# Patient Record
Sex: Female | Born: 1940 | Race: Black or African American | Hispanic: No | Marital: Married | Smoking: Former smoker
Health system: Southern US, Community
[De-identification: ages and names within clinical notes are randomized; demographics above are authoritative.]

## PROBLEM LIST (undated history)

## (undated) DIAGNOSIS — Z8701 Personal history of pneumonia (recurrent): Secondary | ICD-10-CM

## (undated) DIAGNOSIS — Z862 Personal history of diseases of the blood and blood-forming organs and certain disorders involving the immune mechanism: Secondary | ICD-10-CM

## (undated) DIAGNOSIS — E785 Hyperlipidemia, unspecified: Secondary | ICD-10-CM

## (undated) DIAGNOSIS — I10 Essential (primary) hypertension: Secondary | ICD-10-CM

## (undated) DIAGNOSIS — R350 Frequency of micturition: Secondary | ICD-10-CM

## (undated) DIAGNOSIS — I213 ST elevation (STEMI) myocardial infarction of unspecified site: Principal | ICD-10-CM

## (undated) DIAGNOSIS — I739 Peripheral vascular disease, unspecified: Secondary | ICD-10-CM

## (undated) DIAGNOSIS — IMO0001 Reserved for inherently not codable concepts without codable children: Secondary | ICD-10-CM

## (undated) DIAGNOSIS — Z9289 Personal history of other medical treatment: Secondary | ICD-10-CM

## (undated) DIAGNOSIS — I639 Cerebral infarction, unspecified: Secondary | ICD-10-CM

## (undated) DIAGNOSIS — H9190 Unspecified hearing loss, unspecified ear: Secondary | ICD-10-CM

## (undated) DIAGNOSIS — R3915 Urgency of urination: Secondary | ICD-10-CM

## (undated) DIAGNOSIS — E0590 Thyrotoxicosis, unspecified without thyrotoxic crisis or storm: Secondary | ICD-10-CM

## (undated) DIAGNOSIS — M479 Spondylosis, unspecified: Secondary | ICD-10-CM

## (undated) DIAGNOSIS — J449 Chronic obstructive pulmonary disease, unspecified: Secondary | ICD-10-CM

## (undated) HISTORY — DX: Peripheral vascular disease, unspecified: I73.9

## (undated) HISTORY — DX: Hyperlipidemia, unspecified: E78.5

## (undated) HISTORY — PX: OVARY SURGERY: SHX727

## (undated) HISTORY — PX: OTHER SURGICAL HISTORY: SHX169

## (undated) HISTORY — PX: ABDOMINAL HYSTERECTOMY: SHX81

## (undated) HISTORY — DX: Essential (primary) hypertension: I10

## (undated) HISTORY — DX: ST elevation (STEMI) myocardial infarction of unspecified site: I21.3

## (undated) HISTORY — DX: Chronic obstructive pulmonary disease, unspecified: J44.9

## (undated) HISTORY — DX: Spondylosis, unspecified: M47.9

## (undated) HISTORY — DX: Thyrotoxicosis, unspecified without thyrotoxic crisis or storm: E05.90

---

## 1998-12-21 ENCOUNTER — Emergency Department (HOSPITAL_COMMUNITY): Admission: EM | Admit: 1998-12-21 | Discharge: 1998-12-21 | Payer: Self-pay | Admitting: Emergency Medicine

## 1999-11-01 ENCOUNTER — Emergency Department (HOSPITAL_COMMUNITY): Admission: EM | Admit: 1999-11-01 | Discharge: 1999-11-01 | Payer: Self-pay | Admitting: Emergency Medicine

## 2001-03-23 ENCOUNTER — Ambulatory Visit (HOSPITAL_COMMUNITY): Admission: RE | Admit: 2001-03-23 | Discharge: 2001-03-23 | Payer: Self-pay | Admitting: Family Medicine

## 2001-03-23 ENCOUNTER — Encounter: Payer: Self-pay | Admitting: Family Medicine

## 2002-01-28 ENCOUNTER — Encounter: Admission: RE | Admit: 2002-01-28 | Discharge: 2002-01-28 | Payer: Self-pay | Admitting: Family Medicine

## 2002-01-28 ENCOUNTER — Encounter: Payer: Self-pay | Admitting: Family Medicine

## 2002-02-02 ENCOUNTER — Encounter: Admission: RE | Admit: 2002-02-02 | Discharge: 2002-02-02 | Payer: Self-pay | Admitting: Family Medicine

## 2002-02-02 ENCOUNTER — Encounter: Payer: Self-pay | Admitting: Family Medicine

## 2002-03-02 ENCOUNTER — Encounter: Admission: RE | Admit: 2002-03-02 | Discharge: 2002-03-02 | Payer: Self-pay | Admitting: Family Medicine

## 2002-03-02 ENCOUNTER — Encounter: Payer: Self-pay | Admitting: Family Medicine

## 2002-12-08 HISTORY — PX: TOTAL HIP ARTHROPLASTY: SHX124

## 2003-02-08 ENCOUNTER — Encounter: Admission: RE | Admit: 2003-02-08 | Discharge: 2003-02-08 | Payer: Self-pay | Admitting: Orthopedic Surgery

## 2003-02-08 ENCOUNTER — Encounter: Payer: Self-pay | Admitting: Orthopedic Surgery

## 2003-06-16 ENCOUNTER — Encounter: Payer: Self-pay | Admitting: Orthopedic Surgery

## 2003-06-19 ENCOUNTER — Encounter: Payer: Self-pay | Admitting: Orthopedic Surgery

## 2003-06-19 ENCOUNTER — Inpatient Hospital Stay (HOSPITAL_COMMUNITY): Admission: RE | Admit: 2003-06-19 | Discharge: 2003-06-22 | Payer: Self-pay | Admitting: Orthopedic Surgery

## 2004-02-14 ENCOUNTER — Encounter: Admission: RE | Admit: 2004-02-14 | Discharge: 2004-02-14 | Payer: Self-pay | Admitting: Family Medicine

## 2006-01-22 ENCOUNTER — Encounter: Admission: RE | Admit: 2006-01-22 | Discharge: 2006-01-22 | Payer: Self-pay | Admitting: Family Medicine

## 2006-02-05 ENCOUNTER — Encounter: Admission: RE | Admit: 2006-02-05 | Discharge: 2006-02-05 | Payer: Self-pay | Admitting: Family Medicine

## 2006-02-24 ENCOUNTER — Encounter: Admission: RE | Admit: 2006-02-24 | Discharge: 2006-02-24 | Payer: Self-pay | Admitting: Family Medicine

## 2007-09-24 ENCOUNTER — Encounter: Admission: RE | Admit: 2007-09-24 | Discharge: 2007-09-24 | Payer: Self-pay | Admitting: Family Medicine

## 2008-09-10 ENCOUNTER — Emergency Department (HOSPITAL_COMMUNITY): Admission: EM | Admit: 2008-09-10 | Discharge: 2008-09-10 | Payer: Self-pay | Admitting: Family Medicine

## 2009-01-21 ENCOUNTER — Emergency Department (HOSPITAL_COMMUNITY): Admission: EM | Admit: 2009-01-21 | Discharge: 2009-01-22 | Payer: Self-pay | Admitting: Emergency Medicine

## 2009-03-05 ENCOUNTER — Encounter (HOSPITAL_COMMUNITY): Admission: RE | Admit: 2009-03-05 | Discharge: 2009-06-03 | Payer: Self-pay | Admitting: Internal Medicine

## 2010-04-07 ENCOUNTER — Encounter: Payer: Self-pay | Admitting: Emergency Medicine

## 2010-04-07 ENCOUNTER — Inpatient Hospital Stay (HOSPITAL_COMMUNITY): Admission: EM | Admit: 2010-04-07 | Discharge: 2010-04-10 | Payer: Self-pay | Admitting: Cardiology

## 2010-04-07 ENCOUNTER — Ambulatory Visit: Payer: Self-pay | Admitting: Internal Medicine

## 2010-04-07 DIAGNOSIS — I213 ST elevation (STEMI) myocardial infarction of unspecified site: Secondary | ICD-10-CM

## 2010-04-07 HISTORY — PX: CARDIAC CATHETERIZATION: SHX172

## 2010-04-07 HISTORY — DX: ST elevation (STEMI) myocardial infarction of unspecified site: I21.3

## 2010-05-02 ENCOUNTER — Encounter (HOSPITAL_COMMUNITY): Admission: RE | Admit: 2010-05-02 | Discharge: 2010-06-03 | Payer: Self-pay | Admitting: Cardiology

## 2010-05-30 ENCOUNTER — Ambulatory Visit: Payer: Self-pay | Admitting: Vascular Surgery

## 2010-05-30 ENCOUNTER — Inpatient Hospital Stay (HOSPITAL_COMMUNITY): Admission: EM | Admit: 2010-05-30 | Discharge: 2010-05-31 | Payer: Self-pay | Admitting: Emergency Medicine

## 2010-06-04 ENCOUNTER — Encounter (HOSPITAL_COMMUNITY): Admission: RE | Admit: 2010-06-04 | Discharge: 2010-09-02 | Payer: Self-pay | Admitting: Cardiology

## 2010-07-23 ENCOUNTER — Ambulatory Visit: Payer: Self-pay | Admitting: Vascular Surgery

## 2010-08-01 ENCOUNTER — Ambulatory Visit: Payer: Self-pay | Admitting: Cardiology

## 2010-08-08 ENCOUNTER — Ambulatory Visit: Payer: Self-pay | Admitting: Cardiology

## 2010-08-13 ENCOUNTER — Emergency Department (HOSPITAL_COMMUNITY): Admission: EM | Admit: 2010-08-13 | Discharge: 2010-08-14 | Payer: Self-pay | Source: Home / Self Care

## 2010-08-14 ENCOUNTER — Ambulatory Visit: Payer: Self-pay | Admitting: Vascular Surgery

## 2010-09-09 ENCOUNTER — Ambulatory Visit: Payer: Self-pay | Admitting: Cardiology

## 2010-12-29 ENCOUNTER — Encounter: Payer: Self-pay | Admitting: Family Medicine

## 2011-01-13 ENCOUNTER — Ambulatory Visit (INDEPENDENT_AMBULATORY_CARE_PROVIDER_SITE_OTHER): Payer: Medicare Other | Admitting: *Deleted

## 2011-01-13 ENCOUNTER — Encounter: Payer: Self-pay | Admitting: *Deleted

## 2011-01-13 VITALS — BP 138/90 | HR 64 | Resp 18 | Wt 106.4 lb

## 2011-01-13 DIAGNOSIS — J449 Chronic obstructive pulmonary disease, unspecified: Secondary | ICD-10-CM

## 2011-01-13 DIAGNOSIS — I219 Acute myocardial infarction, unspecified: Secondary | ICD-10-CM

## 2011-01-13 DIAGNOSIS — I739 Peripheral vascular disease, unspecified: Secondary | ICD-10-CM | POA: Insufficient documentation

## 2011-01-13 DIAGNOSIS — E785 Hyperlipidemia, unspecified: Secondary | ICD-10-CM | POA: Insufficient documentation

## 2011-01-13 DIAGNOSIS — I10 Essential (primary) hypertension: Secondary | ICD-10-CM

## 2011-01-13 DIAGNOSIS — I213 ST elevation (STEMI) myocardial infarction of unspecified site: Secondary | ICD-10-CM

## 2011-01-13 DIAGNOSIS — M479 Spondylosis, unspecified: Secondary | ICD-10-CM

## 2011-01-13 DIAGNOSIS — E0590 Thyrotoxicosis, unspecified without thyrotoxic crisis or storm: Secondary | ICD-10-CM

## 2011-01-13 NOTE — Assessment & Plan Note (Signed)
Currently doing well without return of chest pain. She is committed to her Plavix until at least May of 2012.

## 2011-01-13 NOTE — Assessment & Plan Note (Signed)
Blood pressures are satisfactory at home. We will keep her on her current regimen.

## 2011-01-13 NOTE — Assessment & Plan Note (Addendum)
CMET and lipids will be checked today. For now we will keep her on her Pravachol.

## 2011-01-13 NOTE — Progress Notes (Signed)
Kristine [name] is seen today for a 4 month visit. She is seen for Dr. Deborah Chalk. She is doing well from our standpoint. She is not having chest pain. She occasionally will have dyspnea. She has had some cold symptoms over the past 2 weeks. She is not able to exercise because of her prior left ankle fracture. She is tolerating her medicines. She is keeping check of her blood pressure at home. Her readings have been good. She has cut her hydralazine back because of low readings. She has been a little more anxious because of some family issues. She is not smoking. She denies claudication but she has also not been walking.   The 15 point review of systems is positive for sinus problems, mild dyspnea, joint pain and difficulties walking. All other review of systems are negative.   The past medical history, family history, social history, meds and allergies are reviewed.  Exam:  She is very pleasant and in no acute distress. Skin is warm and dry. Color is normal. Blood pressure was 138/90. She has not had her meds today. HEENT is unremarkable. Lungs are clear. Cardiac exam shows a regular rate and rhythm. Abdomen is soft. Extremities are without edema. Gait and ROM are intact. She has a brace on the left ankle. She has no gross neurologic deficits.

## 2011-01-13 NOTE — Assessment & Plan Note (Signed)
No current complaints of claudication, but also not exercising. I have encouraged her to get back into her walking routine or consider water exercises.

## 2011-02-23 LAB — URINALYSIS, ROUTINE W REFLEX MICROSCOPIC
Hgb urine dipstick: NEGATIVE
Nitrite: NEGATIVE
Protein, ur: NEGATIVE mg/dL
Specific Gravity, Urine: 1.015 (ref 1.005–1.030)
Urobilinogen, UA: 0.2 mg/dL (ref 0.0–1.0)

## 2011-02-23 LAB — CBC
Hemoglobin: 12.1 g/dL (ref 12.0–15.0)
Hemoglobin: 12.3 g/dL (ref 12.0–15.0)
MCH: 30.3 pg (ref 26.0–34.0)
MCH: 31.2 pg (ref 26.0–34.0)
MCHC: 32.8 g/dL (ref 30.0–36.0)
MCHC: 33.6 g/dL (ref 30.0–36.0)
Platelets: 286 10*3/uL (ref 150–400)
RDW: 14.9 % (ref 11.5–15.5)
RDW: 14.9 % (ref 11.5–15.5)

## 2011-02-23 LAB — CARDIAC PANEL(CRET KIN+CKTOT+MB+TROPI)
CK, MB: 1.6 ng/mL (ref 0.3–4.0)
CK, MB: 2.1 ng/mL (ref 0.3–4.0)
Relative Index: 1.1 (ref 0.0–2.5)
Total CK: 150 U/L (ref 7–177)
Total CK: 230 U/L — ABNORMAL HIGH (ref 7–177)
Troponin I: 0.01 ng/mL (ref 0.00–0.06)
Troponin I: <0.01 ng/mL (ref 0.00–0.06)

## 2011-02-23 LAB — COMPREHENSIVE METABOLIC PANEL
Alkaline Phosphatase: 101 U/L (ref 39–117)
BUN: 11 mg/dL (ref 6–23)
Chloride: 107 mEq/L (ref 96–112)
GFR calc non Af Amer: >60 mL/min (ref >60)
Glucose, Bld: 161 mg/dL — ABNORMAL HIGH (ref 70–99)
Potassium: 3.9 mEq/L (ref 3.5–5.1)
Total Bilirubin: 0.4 mg/dL (ref 0.3–1.2)

## 2011-02-23 LAB — BASIC METABOLIC PANEL
BUN: 8 mg/dL (ref 6–23)
CO2: 28 mEq/L (ref 19–32)
Calcium: 9.4 mg/dL (ref 8.4–10.5)
Calcium: 9.4 mg/dL (ref 8.4–10.5)
Creatinine, Ser: 0.7 mg/dL (ref 0.4–1.2)
Creatinine, Ser: 0.9 mg/dL (ref 0.4–1.2)
GFR calc Af Amer: >60 mL/min (ref >60)
GFR calc non Af Amer: >60 mL/min (ref >60)
GFR calc non Af Amer: >60 mL/min (ref >60)
Glucose, Bld: 87 mg/dL (ref 70–99)

## 2011-02-23 LAB — DIFFERENTIAL
Basophils Absolute: 0.1 10*3/uL (ref 0.0–0.1)
Basophils Relative: 1 % (ref 0–1)
Eosinophils Absolute: 0.2 10*3/uL (ref 0.0–0.7)
Monocytes Relative: 8 % (ref 3–12)
Neutro Abs: 3 10*3/uL (ref 1.7–7.7)
Neutrophils Relative %: 63 % (ref 43–77)

## 2011-02-23 LAB — GLUCOSE, CAPILLARY
Glucose-Capillary: 128 mg/dL — ABNORMAL HIGH (ref 70–99)
Glucose-Capillary: 218 mg/dL — ABNORMAL HIGH (ref 70–99)

## 2011-02-23 LAB — PROTIME-INR: Prothrombin Time: 13.2 seconds (ref 11.6–15.2)

## 2011-02-23 LAB — LIPID PANEL
Cholesterol: 199 mg/dL (ref 0–200)
Total CHOL/HDL Ratio: 3 RATIO
VLDL: 16 mg/dL (ref 0–40)

## 2011-02-23 LAB — HEPATIC FUNCTION PANEL
AST: 24 U/L (ref 0–37)
Albumin: 3.4 g/dL — ABNORMAL LOW (ref 3.5–5.2)
Alkaline Phosphatase: 92 U/L (ref 39–117)
Bilirubin, Direct: 0.1 mg/dL (ref 0.0–0.3)
Total Bilirubin: 0.5 mg/dL (ref 0.3–1.2)

## 2011-02-23 LAB — POCT CARDIAC MARKERS
CKMB, poc: 1.1 ng/mL (ref 1.0–8.0)
Myoglobin, poc: 70.2 ng/mL (ref 12–200)

## 2011-02-23 LAB — HEMOGLOBIN A1C: Hgb A1c MFr Bld: 5.7 % — ABNORMAL HIGH (ref <5.7)

## 2011-02-25 LAB — POCT I-STAT, CHEM 8
HCT: 45 % (ref 36.0–46.0)
Hemoglobin: 15.3 g/dL — ABNORMAL HIGH (ref 12.0–15.0)
Potassium: 3.6 mEq/L (ref 3.5–5.1)
Sodium: 141 mEq/L (ref 135–145)

## 2011-02-25 LAB — BASIC METABOLIC PANEL
BUN: 14 mg/dL (ref 6–23)
CO2: 26 mEq/L (ref 19–32)
CO2: 30 mEq/L (ref 19–32)
Calcium: 8.2 mg/dL — ABNORMAL LOW (ref 8.4–10.5)
Calcium: 8.3 mg/dL — ABNORMAL LOW (ref 8.4–10.5)
Calcium: 8.6 mg/dL (ref 8.4–10.5)
Chloride: 108 mEq/L (ref 96–112)
GFR calc Af Amer: >60 mL/min (ref >60)
GFR calc non Af Amer: >60 mL/min (ref >60)
Glucose, Bld: 119 mg/dL — ABNORMAL HIGH (ref 70–99)
Glucose, Bld: 87 mg/dL (ref 70–99)
Potassium: 3.6 mEq/L (ref 3.5–5.1)
Potassium: 4 mEq/L (ref 3.5–5.1)
Potassium: 4.3 mEq/L (ref 3.5–5.1)
Sodium: 139 mEq/L (ref 135–145)
Sodium: 139 mEq/L (ref 135–145)
Sodium: 141 mEq/L (ref 135–145)

## 2011-02-25 LAB — DIFFERENTIAL
Basophils Absolute: 0 10*3/uL (ref 0.0–0.1)
Basophils Absolute: 0 10*3/uL (ref 0.0–0.1)
Basophils Absolute: 0.1 10*3/uL (ref 0.0–0.1)
Basophils Relative: 0 % (ref 0–1)
Basophils Relative: 0 % (ref 0–1)
Basophils Relative: 1 % (ref 0–1)
Eosinophils Absolute: 0 10*3/uL (ref 0.0–0.7)
Eosinophils Relative: 0 % (ref 0–5)
Eosinophils Relative: 3 % (ref 0–5)
Lymphocytes Relative: 23 % (ref 12–46)
Lymphocytes Relative: 33 % (ref 12–46)
Lymphocytes Relative: 29 % (ref 12–46)
Lymphs Abs: 2 10*3/uL (ref 0.7–4.0)
Lymphs Abs: 1.9 10*3/uL (ref 0.7–4.0)
Monocytes Absolute: 0.6 10*3/uL (ref 0.1–1.0)
Monocytes Absolute: 0.8 10*3/uL (ref 0.1–1.0)
Monocytes Relative: 10 % (ref 3–12)
Neutro Abs: 3.4 10*3/uL (ref 1.7–7.7)
Neutro Abs: 3.5 10*3/uL (ref 1.7–7.7)
Neutro Abs: 6.9 10*3/uL (ref 1.7–7.7)
Neutrophils Relative %: 69 % (ref 43–77)
Neutrophils Relative %: 60 % (ref 43–77)

## 2011-02-25 LAB — TSH: TSH: 0.47 u[IU]/mL (ref 0.350–4.500)

## 2011-02-25 LAB — GLUCOSE, CAPILLARY
Glucose-Capillary: 101 mg/dL — ABNORMAL HIGH (ref 70–99)
Glucose-Capillary: 102 mg/dL — ABNORMAL HIGH (ref 70–99)
Glucose-Capillary: 163 mg/dL — ABNORMAL HIGH (ref 70–99)
Glucose-Capillary: 175 mg/dL — ABNORMAL HIGH (ref 70–99)
Glucose-Capillary: 81 mg/dL (ref 70–99)
Glucose-Capillary: 93 mg/dL (ref 70–99)
Glucose-Capillary: 94 mg/dL (ref 70–99)
Glucose-Capillary: 97 mg/dL (ref 70–99)

## 2011-02-25 LAB — CBC
HCT: 33.5 % — ABNORMAL LOW (ref 36.0–46.0)
HCT: 34.5 % — ABNORMAL LOW (ref 36.0–46.0)
HCT: 34.9 % — ABNORMAL LOW (ref 36.0–46.0)
HCT: 41.8 % (ref 36.0–46.0)
Hemoglobin: 11.3 g/dL — ABNORMAL LOW (ref 12.0–15.0)
Hemoglobin: 11.9 g/dL — ABNORMAL LOW (ref 12.0–15.0)
Hemoglobin: 12.2 g/dL (ref 12.0–15.0)
MCHC: 33.8 g/dL (ref 30.0–36.0)
MCHC: 34.5 g/dL (ref 30.0–36.0)
MCV: 92.1 fL (ref 78.0–100.0)
MCV: 92.3 fL (ref 78.0–100.0)
Platelets: 216 10*3/uL (ref 150–400)
Platelets: 238 10*3/uL (ref 150–400)
RBC: 3.63 MIL/uL — ABNORMAL LOW (ref 3.87–5.11)
RBC: 3.82 MIL/uL — ABNORMAL LOW (ref 3.87–5.11)
RDW: 14.6 % (ref 11.5–15.5)
RDW: 14.9 % (ref 11.5–15.5)
WBC: 6.1 10*3/uL (ref 4.0–10.5)
WBC: 6.4 10*3/uL (ref 4.0–10.5)

## 2011-02-25 LAB — COMPREHENSIVE METABOLIC PANEL
ALT: 16 U/L (ref 0–35)
Alkaline Phosphatase: 74 U/L (ref 39–117)
CO2: 25 mEq/L (ref 19–32)
GFR calc non Af Amer: >60 mL/min (ref >60)
Glucose, Bld: 155 mg/dL — ABNORMAL HIGH (ref 70–99)
Potassium: 4 mEq/L (ref 3.5–5.1)
Sodium: 137 mEq/L (ref 135–145)
Total Bilirubin: 0.4 mg/dL (ref 0.3–1.2)

## 2011-02-25 LAB — LIPID PANEL
LDL Cholesterol: 105 mg/dL — ABNORMAL HIGH (ref 0–99)
Total CHOL/HDL Ratio: 3 RATIO
VLDL: 9 mg/dL (ref 0–40)

## 2011-02-25 LAB — CARDIAC PANEL(CRET KIN+CKTOT+MB+TROPI)
CK, MB: 4 ng/mL (ref 0.3–4.0)
Relative Index: 1.8 (ref 0.0–2.5)
Relative Index: 2.2 (ref 0.0–2.5)
Relative Index: 2.7 — ABNORMAL HIGH (ref 0.0–2.5)
Total CK: 204 U/L — ABNORMAL HIGH (ref 7–177)
Troponin I: 0.56 ng/mL (ref 0.00–0.06)
Troponin I: 0.58 ng/mL (ref 0.00–0.06)

## 2011-02-25 LAB — POCT CARDIAC MARKERS
CKMB, poc: 2.7 ng/mL (ref 1.0–8.0)
Myoglobin, poc: 98.2 ng/mL (ref 12–200)

## 2011-02-25 LAB — PROTIME-INR
INR: 0.95 (ref 0.00–1.49)
Prothrombin Time: 12.6 seconds (ref 11.6–15.2)

## 2011-02-25 LAB — MRSA PCR SCREENING: MRSA by PCR: NEGATIVE

## 2011-02-25 LAB — C-REACTIVE PROTEIN: CRP: 0.1 mg/dL — ABNORMAL LOW (ref <0.6)

## 2011-02-25 LAB — SEDIMENTATION RATE: Sed Rate: 19 mm/hr (ref 0–22)

## 2011-04-14 ENCOUNTER — Other Ambulatory Visit: Payer: Self-pay | Admitting: *Deleted

## 2011-04-14 DIAGNOSIS — E7800 Pure hypercholesterolemia, unspecified: Secondary | ICD-10-CM

## 2011-04-15 ENCOUNTER — Other Ambulatory Visit: Payer: Medicare Other | Admitting: *Deleted

## 2011-04-22 NOTE — Consult Note (Signed)
NEW PATIENT CONSULTATION   IRVING, LUBBERS  DOB:  [birth year]                                       07/23/2010  CHART#:[id number]   The patient is a 70 year old female referred by Dr. Deborah Chalk for lower  extremity occlusive disease.  This patient suffered a myocardial  infarction in May of this year and had PTCA and stenting of her coronary  arteries.  She also suffered a small stroke or TIA in June of this year  consisting of some slurred speech which was transient as well as a  headache over her right ear.  She has been noting heavy discomfort in  her legs while walking on the treadmill with rehab.  She describes this  as a burning discomfort.  She is able to walk at least one block without  stopping before her symptoms begin but cannot walk 1 mile.  She has no  history of infection, gangrene, rest pain or cellulitis.  She does  occasionally have burning on the soles of both feet as well as a reddish  discoloration in her feet.  She states she is not being terribly limited  by this at the present time.   CHRONIC MEDICAL PROBLEMS:  1. Hypertension.  2. Hyperlipidemia.  3. Coronary artery disease previous MI in may of 2011.  4. COPD.  5. History of stroke June of 2011.  6. Hyperthyroidism.   PAST SURGICAL HISTORY:  Left hip replacement, tubal ligation,  hysterectomy and cyst removal.   SOCIAL HISTORY:  The patient is married, has three children, is retired,  smoked a pack of cigarettes per day for 51 years, stopped in May of this  year and does not use alcohol.   FAMILY HISTORY:  Positive for myocardial infarction in her father,  diabetes in an aunt and negative for stroke.   REVIEW OF SYSTEMS:  Positive for some weight loss secondary to her  hyperthyroidism, syncope, headaches, bronchitis, mini stroke as noted,  leg discomfort noted in present illness, occasional chest discomfort,  urinary frequency.  All other systems in review of systems are  negative.   PHYSICAL EXAM:  Vital signs:  Blood pressure 154/92, heart rate 63,  respirations 16.  General:  This is a well-developed, well-nourished  female who is alert and oriented times 3.  She is in no distress.  She  is thin.  HEENT:  Exam normal for age.  EOMs intact.  Lungs:  Clear to  auscultation.  No rhonchi or wheezing.  Cardiovascular:  Regular rhythm.  No murmurs.  Carotid pulses 3+.  No bruits.  Abdomen:  Soft, nontender  with no masses.  Musculoskeletal:  Exam is free of major deformities.  Neurological:  Normal.  Skin:  Free of rashes.  Lower extremity exam  reveals the right leg to have a 2+ femoral, 1-2+ popliteal and 1-2+  posterior tibial pulse palpable.  Left leg has a 2+ femoral, 2+  popliteal and 1-2+ posterior tibial pulse palpable.  There is no  evidence of any infection, ulceration or ischemia in the leg.   I reviewed the records provided by Dr. Deborah Chalk including lower extremity  arterial Dopplers and carotid duplex exam performed at [facility].  Lower extremity Dopplers revealed an ABI of 0.83 in the right posterior  tibial and 0.99 in the left posterior tibial.  This carotid disease is  very mild in the 30% range.   She does have some atherosclerosis in her lower extremities but  certainly would not need treatment at this time.  I have recommended her  increasing her activity as tolerated to help stimulate collaterals.  If  she should develop progressive claudication which is severely limiting  her or limb threatening problems we will need to evaluate her again at  that time but there is no need for further evaluation or workup at this  time.     Quita Skye Hart Rochester, M.D.  Electronically Signed   JDL/MEDQ  D:  07/23/2010  T:  07/24/2010  Job:  [id number]   cc:   Colleen Can. Deborah Chalk, M.D.  Dr Clyda Greener

## 2011-04-25 ENCOUNTER — Other Ambulatory Visit: Payer: Self-pay | Admitting: Cardiology

## 2011-04-25 NOTE — Discharge Summary (Signed)
Kristine [name], Kristine [name]                         ACCOUNT NO.:  [id number]   MEDICAL RECORD NO.:  [id number]                   PATIENT TYPE:  INP   LOCATION:  5006                                 FACILITY:  [facility]   PHYSICIAN:  Loreta Ave, M.D.              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   DISCHARGE DIAGNOSES:  1. 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  2. Chronic airway obstruction.  3. Hypertension.  4. Tobacco use.   PROCEDURE:  Left total hip replacement.   HISTORY:  70 year old black female with persistent left hip pain first  evaluated in 1995.  She has had steady deterioration of her symptoms.  She  is now having rest and night time pain.  Radiographic evidence of  degenerative joint disease of the left hip which is end-stage.  She is now  indicated for left total hip replacement.   HOSPITAL COURSE:  70 year old female admitted 06/19/2003 after appropriate  laboratory studies were obtained as well as one gram Ancef IV on call to the  operating room.  She was taken to the operating room where she had a left  total hip replacement.  She tolerated the procedure well.  She was continued  postoperatively on Ancef one gram IV q.8h x3 doses.  A Foley was placed  intraoperatively.  She was placed on heparin 5000 units SQ q.12h until her  Coumadin became therapeutic.  Consultation with physical therapy and  occupational therapy were obtained.  Ambulation touch down weight bearing on  the left.  A nicotine patch was placed by pharmacy.  She was allowed out of  bed to a chair the following day.  She was weaned to oral pain medications  on June 21, 2003.  The remainder of her hospital course was uneventful.  She  was discharged on June 22, 2003 to return to our office in follow up in 10  to 14 days.  She was discharged in improved condition.   EKG revealed normal sinus rhythm with biatrial enlargement.  Pulmonary  disease pattern.  Portable chest x-ray on March 03, 2003 without acute  cardiopulmonary findings.  Bi-apical pleural and parenchymal scarring  changes probably related to remote granulomatous disease.  Portable left hip  reveals a non-cemented femoral and acetabular components of the left hip  arthroplasty projected in expected location.  Negative for fracture or other  apparent complications.   LABORATORY STUDIES:  Hemoglobin 14.2, hematocrit 42.0%, white count 6100,  platelets 285,000.  Discharge hemoglobin 8.9, hematocrit 26.0%, white count  9600, platelets 222,000.  Discharge protime was 16.5 with an INR of 1.5.  Postoperative chemistry - sodium 138, potassium 5.0, chloride 106, CO2 26,  BUN 15, glucose 90, creatinine 0.8, calcium 9.8,  total protein 6.9, albumin  4.0.  AST 28, ALT 16, AFP 74, total bilirubin 1.4.  Discharge sodium 139,  potassium 3.7, chloride 101, CO2 32, glucose 133, BUN 7, creatinine 0.6 and  calcium 8.9.  Urinalysis was benign for a voided urine.  Blood type was AB  positive, antibody screen negative.   DISCHARGE MEDICATIONS:  1. Prescription for Percocet 5/325 one to two tablets q.4h p.r.n. pain.  2. Coumadin 5 mg as per [facility].  She will be given 5 mg     alternating with 2.5.  3. Colace 100 mg b.i.d.  4. Iron sulfate 325 mg b.i.d.  5. Nicotine patch over-the-counter from the drug store.  6. Lovenox 30 mg inject b.i.d. as instructed.   DISCHARGE INSTRUCTIONS:  Office taught physical therapy.  She is to remain  touch down weight bearing on the left.  No restrictions in her diet.  Keep  her wound clean and dry.  She may shower.  She will cover it with a Sterile  dressing daily.  Follow the blue instruction sheet.  Follow back with [place] in  10 to 14 days for recheck evaluation.   DISCHARGE CONDITION:  Discharged in improved  condition.      Oris Drone Petrarca, P.A.-C.                Loreta Ave, M.D.    BDP/MEDQ  D:  07/06/2003  T:  07/07/2003  Job:  [id number]

## 2011-04-25 NOTE — Op Note (Signed)
[name], Kristine [name]                         ACCOUNT NO.:  [id number]   MEDICAL RECORD NO.:  [id number]                   PATIENT TYPE:  INP   LOCATION:  5006                                 FACILITY:  [facility]   PHYSICIAN:  Loreta Ave, M.D.              DATE OF BIRTH:  [birth year]   DATE OF PROCEDURE:  06/19/2003  DATE OF DISCHARGE:                                 OPERATIVE REPORT   PREOPERATIVE DIAGNOSES:  End stage degenerative arthritis, left hip.   POSTOPERATIVE DIAGNOSES:  End stage degenerative arthritis, left hip.   OPERATION PERFORMED:  Left total hip replacement, Osteonics prosthesis.  A  50 mm Trident PSL metallic cup with a 32 mm size E ceramic insert.  A Press-  fit femoral component size #8 with a distal 12 mm stem.  Secure-Fit plus  stem.  32 mm ceramic head plus 5.   SURGEON:  Loreta Ave, M.D.   ASSISTANT:  Arlys John D. Petrarca, P.A.-C.   ANESTHESIA:  General.   ESTIMATED BLOOD LOSS:  .   BLOOD REPLACED:  None.   SPECIMENS:  Bone and soft tissue.   CULTURES:  None.   COMPLICATIONS:  None.   DRESSING:  Soft compressive with abduction pillow.   DESCRIPTION OF PROCEDURE:  The patient was brought to the operating room and  placed on the operating table in supine position.  After adequate anesthesia  had been obtained, turned to lateral position left side up.  Appropriate  padding and support.  Prepped and draped in the usual sterile fashion avoid  Betadine because of an iodine allergy.  An incision along the shaft of the  femur extending posterior superior.  Skin and subcutaneous tissue divided.  Hemostasis with cautery.  The iliotibial band incised.  Charnley retractor  put in place.  Neurovascular structures identified and protected.  External  rotator and capsule taken down off the back of  the intertrochanteric groove  on the femur and tagged with FiberWire suture.  Hip exposed.  Dislocated  posteriorly.  Grade 4 changes throughout.   Femoral head removed in line with  the prosthesis one fingerbreadth above the lesser trochanter. The acetabulum  exposed.  Marked degenerative changes.  Sequential reaming up to good  bleeding bone 50 mm.  After being appropriately sized, the 50 mm insert was  then hammered in place at 45 degrees of abduction, 20 degrees of anteversion  and further fixed with two screws through the cup which were predrilled and  tapped.  32 mm size E insert inserted.  Attention turned to the femur.  Sequential reaming with power and hand held reamers proximally and distally  until I got good fitting both proximally and distally for a #8 Secure-Fit  plus component with a 12 mm stem.  After approximately trialing, the  definitive stem was hammered in place restoring normal femoral anteversion.  With the plus 5 ceramic head, 32 mm,  the hip was reduced.  Excellent  stability, flexion and extension with good restoration of leg lengths.  Wound irrigated.  External rotator and capsule were repaired to the back of  the intertrochanteric groove through drill holes and the FiberWire tied over  a bony bridge.  Charnley retractor removed.  The iliotibial band closed with  #1 Vicryl.  Skin and subcutaneous tissue with Vicryl and staples.  Margins  of the wound injected with Marcaine.  Sterile compressive dressing applied.  Returned to supine position.  Abduction pillow placed.  Anesthesia reversed.  Brought to recovery room.  Tolerated surgery well without complication.                                                Loreta Ave, M.D.    DFM/MEDQ  D:  06/19/2003  T:  06/19/2003  Job:  [id number]

## 2011-07-15 ENCOUNTER — Encounter: Payer: Self-pay | Admitting: Nurse Practitioner

## 2011-07-23 ENCOUNTER — Other Ambulatory Visit: Payer: Self-pay | Admitting: Cardiology

## 2011-07-23 ENCOUNTER — Other Ambulatory Visit (INDEPENDENT_AMBULATORY_CARE_PROVIDER_SITE_OTHER): Payer: Medicare Other | Admitting: *Deleted

## 2011-07-23 ENCOUNTER — Ambulatory Visit (INDEPENDENT_AMBULATORY_CARE_PROVIDER_SITE_OTHER): Payer: Medicare Other | Admitting: Nurse Practitioner

## 2011-07-23 ENCOUNTER — Encounter: Payer: Self-pay | Admitting: Nurse Practitioner

## 2011-07-23 VITALS — BP 110/72 | HR 64 | Ht 62.0 in | Wt 115.2 lb

## 2011-07-23 DIAGNOSIS — I213 ST elevation (STEMI) myocardial infarction of unspecified site: Secondary | ICD-10-CM

## 2011-07-23 DIAGNOSIS — E785 Hyperlipidemia, unspecified: Secondary | ICD-10-CM

## 2011-07-23 DIAGNOSIS — I10 Essential (primary) hypertension: Secondary | ICD-10-CM

## 2011-07-23 DIAGNOSIS — I219 Acute myocardial infarction, unspecified: Secondary | ICD-10-CM

## 2011-07-23 DIAGNOSIS — E7800 Pure hypercholesterolemia, unspecified: Secondary | ICD-10-CM

## 2011-07-23 DIAGNOSIS — I739 Peripheral vascular disease, unspecified: Secondary | ICD-10-CM

## 2011-07-23 DIAGNOSIS — E0590 Thyrotoxicosis, unspecified without thyrotoxic crisis or storm: Secondary | ICD-10-CM

## 2011-07-23 LAB — HEPATIC FUNCTION PANEL
Alkaline Phosphatase: 78 U/L (ref 39–117)
Bilirubin, Direct: 0.1 mg/dL (ref 0.0–0.3)
Total Bilirubin: 0.5 mg/dL (ref 0.3–1.2)
Total Protein: 7.6 g/dL (ref 6.0–8.3)

## 2011-07-23 LAB — BASIC METABOLIC PANEL
CO2: 30 mEq/L (ref 19–32)
Calcium: 9.4 mg/dL (ref 8.4–10.5)
Creatinine, Ser: 0.9 mg/dL (ref 0.4–1.2)
GFR: 85.52 mL/min (ref >60.00)
Sodium: 142 mEq/L (ref 135–145)

## 2011-07-23 NOTE — Assessment & Plan Note (Signed)
Blood pressure looks good. No change in her medicines.  

## 2011-07-23 NOTE — Patient Instructions (Signed)
Regular exercise is encouraged Stay on your current medicines I will have you see Dr. Doylene Bode in 6 months. Call for any problems.

## 2011-07-23 NOTE — Assessment & Plan Note (Signed)
Labs are checked today.  

## 2011-07-23 NOTE — Assessment & Plan Note (Signed)
She remains on Tapazole. She is followed by Dr. Margaretmary Bayley.

## 2011-07-23 NOTE — Assessment & Plan Note (Signed)
She is doing well. She is not smoking. I have elected to leave her on her Plavix chronically with her history of PVD and prior TIA. She is agreeable. She is tolerating her medicines. I will have her see Dr. Sanjuana Kava in 6 months. Patient is agreeable to this plan and will call if any problems develop in the interim.

## 2011-07-23 NOTE — Progress Notes (Signed)
    Kristine [name] Date of Birth: 1941-06-18   History of Present Illness: Kristine [name] is seen today for her 6 month visit. She is seen for Dr. Sanjuana Kava. She is a former patient of Dr. Ronnald Nian. She continues to do well. She is over one year out from her MI. She has drug coated stents in place. She is not smoking. She is not exercising but remains busy caring for her grandchildren. She has gained some weight. She is tolerating her medicines. Blood pressure has been great at home.  Current Outpatient Prescriptions on File Prior to Visit  Medication Sig Dispense Refill  . calcium citrate-vitamin D 200-200 MG-UNIT TABS Take 1 tablet by mouth daily. OCCASIONALLY       . hydrALAZINE (APRESOLINE) 25 MG tablet Take 25 mg by mouth daily. States she is only taking once a day      . methimazole (TAPAZOLE) 5 MG tablet Take 5 mg by mouth daily.        . metoprolol (TOPROL-XL) 50 MG 24 hr tablet Take 50 mg by mouth daily.        . nitroglycerin (NITROSTAT) 0.6 MG SL tablet Place 0.6 mg under the tongue every 5 (five) minutes as needed.        Marland Kitchen PLAVIX 75 MG tablet TAKE ONE (1) TABLET(S) DAILY  30 tablet  6  . pravastatin (PRAVACHOL) 40 MG tablet Take 40 mg by mouth daily.        Marland Kitchen VITAMIN D, CHOLECALCIFEROL, PO Take 2,000 Int'l Units by mouth daily. OCCASSIONALLY        Allergies  Allergen Reactions  . Hydrocodone   . Iodides   . Shellfish-Derived Products     Past Medical History  Diagnosis Date  . STEMI (ST elevation myocardial infarction) May 2011    Treated with drug coated stents to the RCA  . HTN (hypertension)   . Hyperlipidemia   . PVD (peripheral vascular disease)   . COPD (chronic obstructive pulmonary disease)   . Hyperthyroidism     treated wtih radioactive iodine  . OA (osteoarthritis of spine)     Past Surgical History  Procedure Date  . Total hip arthroplasty 2004  . Abdominal hysterectomy     Had ovarian resection and required lysis of adhesions  . Cardiac  catheterization 04/07/2010    EF 60-70%    History  Smoking status  . Former Smoker  . Types: Cigarettes  . Quit date: 04/07/2010  Smokeless tobacco  . Not on file    History  Alcohol Use No    No family history on file.  Review of Systems: The review of systems is positive for intermittent headaches. No other neuro symptoms. She has had prior TIA from HTN in June of 2011. No claudication.  All other systems were reviewed and are negative.  Physical Exam: Ht 5\' 2"  (1.575 m)  Wt 115 lb 3.2 oz (52.254 kg)  BMI 21.07 kg/m2 Patient is very pleasant and in no acute distress. Skin is warm and dry. Color is normal.  HEENT is unremarkable. Normocephalic/atraumatic. PERRL. Sclera are nonicteric. Neck is supple. No masses. No JVD. Lungs are clear. Cardiac exam shows a regular rate and rhythm. Abdomen is soft. Extremities are without edema. Gait and ROM are intact. No gross neurologic deficits noted.  LABORATORY DATA: PENDING.   Assessment / Plan:

## 2011-07-23 NOTE — Assessment & Plan Note (Signed)
She has known PVD and has been evaluated by Dr. Hart Rochester. No complaints of claudication reported.

## 2011-07-28 ENCOUNTER — Telehealth: Payer: Self-pay | Admitting: *Deleted

## 2011-07-28 DIAGNOSIS — E7800 Pure hypercholesterolemia, unspecified: Secondary | ICD-10-CM

## 2011-07-28 MED ORDER — ATORVASTATIN CALCIUM 20 MG PO TABS: 20.0000 mg | ORAL_TABLET | Freq: Every day | ORAL | Status: DC

## 2011-07-28 NOTE — Telephone Encounter (Signed)
Notified of lab results. She will be willing to try Lipitor 20 mg. Will send to [facility] on [address]. Gave her an app for 6 wks.for fasting lab.

## 2011-07-28 NOTE — Telephone Encounter (Signed)
Message copied by Lorayne Bender on Mon Jul 28, 2011  2:47 PM ------      Message from: Rosalio Macadamia      Created: Fri Jul 25, 2011 10:32 AM       Ok to report. LDL not at goal. Would she consider switching to Lipitor? If so, start 20 mg and recheck labs in 6 weeks.

## 2011-08-08 ENCOUNTER — Other Ambulatory Visit: Payer: Self-pay | Admitting: Cardiology

## 2011-09-01 ENCOUNTER — Other Ambulatory Visit (INDEPENDENT_AMBULATORY_CARE_PROVIDER_SITE_OTHER): Payer: Medicare Other | Admitting: *Deleted

## 2011-09-01 DIAGNOSIS — E7800 Pure hypercholesterolemia, unspecified: Secondary | ICD-10-CM

## 2011-09-01 LAB — LIPID PANEL
Cholesterol: 148 mg/dL (ref 0–200)
HDL: 61.9 mg/dL (ref >39.00)
LDL Cholesterol: 69 mg/dL (ref 0–99)
Total CHOL/HDL Ratio: 2
Triglycerides: 87 mg/dL (ref 0.0–149.0)
VLDL: 17.4 mg/dL (ref 0.0–40.0)

## 2011-09-01 LAB — HEPATIC FUNCTION PANEL
ALT: 13 U/L (ref 0–35)
AST: 18 U/L (ref 0–37)
Albumin: 3.8 g/dL (ref 3.5–5.2)
Alkaline Phosphatase: 63 U/L (ref 39–117)
Bilirubin, Direct: 0 mg/dL (ref 0.0–0.3)
Total Bilirubin: 0.4 mg/dL (ref 0.3–1.2)
Total Protein: 7 g/dL (ref 6.0–8.3)

## 2011-09-04 ENCOUNTER — Telehealth: Payer: Self-pay | Admitting: *Deleted

## 2011-09-04 DIAGNOSIS — I10 Essential (primary) hypertension: Secondary | ICD-10-CM

## 2011-09-04 DIAGNOSIS — E785 Hyperlipidemia, unspecified: Secondary | ICD-10-CM

## 2011-09-04 NOTE — Telephone Encounter (Signed)
Lm w/lab results. Will repeat labs;lp,hfp,bmet in 6 mo

## 2011-09-04 NOTE — Telephone Encounter (Signed)
Message copied by Lorayne Bender on Thu Sep 04, 2011  4:13 PM ------      Message from: Rosalio Macadamia      Created: Mon Sep 01, 2011  3:50 PM       Ok to report. Labs are satisfactory.  Stay on same medicines. Recheck BMET/HPF/LIPIDS  in 6 months.

## 2011-10-31 ENCOUNTER — Other Ambulatory Visit: Payer: Self-pay | Admitting: Cardiology

## 2011-12-14 ENCOUNTER — Other Ambulatory Visit: Payer: Self-pay | Admitting: Cardiology

## 2011-12-16 ENCOUNTER — Other Ambulatory Visit: Payer: Self-pay | Admitting: Cardiovascular Disease

## 2011-12-16 DIAGNOSIS — I10 Essential (primary) hypertension: Secondary | ICD-10-CM

## 2011-12-16 MED ORDER — HYDRALAZINE HCL 25 MG PO TABS: 25.0000 mg | ORAL_TABLET | Freq: Every day | ORAL | Status: DC

## 2011-12-16 NOTE — Telephone Encounter (Signed)
New Msg: Pt calling wanting to know if pt needs to continue taking Hydralazine 25 mg. If so please call this RX into [facility]. Please return pt call to discuss further.

## 2011-12-16 NOTE — Telephone Encounter (Signed)
Reviewed note from last office visit with Norma Fredrickson, NP and pt is to continue hydralazine.  I spoke with pt and she has had no changes since this visit.  I told her she should continue.  Will sent prescription to [facility] on [address]. Clarified with pt and she takes hydralazine 25 mg once daily

## 2012-01-20 ENCOUNTER — Other Ambulatory Visit (INDEPENDENT_AMBULATORY_CARE_PROVIDER_SITE_OTHER): Payer: Medicare Other | Admitting: *Deleted

## 2012-01-20 ENCOUNTER — Encounter: Payer: Self-pay | Admitting: Cardiovascular Disease

## 2012-01-20 ENCOUNTER — Ambulatory Visit (INDEPENDENT_AMBULATORY_CARE_PROVIDER_SITE_OTHER): Payer: Medicare Other | Admitting: Cardiovascular Disease

## 2012-01-20 VITALS — BP 122/72 | HR 72 | Ht 61.0 in | Wt 120.0 lb

## 2012-01-20 DIAGNOSIS — E785 Hyperlipidemia, unspecified: Secondary | ICD-10-CM

## 2012-01-20 DIAGNOSIS — I10 Essential (primary) hypertension: Secondary | ICD-10-CM

## 2012-01-20 DIAGNOSIS — I2510 Atherosclerotic heart disease of native coronary artery without angina pectoris: Secondary | ICD-10-CM

## 2012-01-20 LAB — LIPID PANEL
Cholesterol: 164 mg/dL (ref 0–200)
HDL: 58 mg/dL (ref >39.00)
LDL Cholesterol: 91 mg/dL (ref 0–99)
Total CHOL/HDL Ratio: 3
Triglycerides: 73 mg/dL (ref 0.0–149.0)
VLDL: 14.6 mg/dL (ref 0.0–40.0)

## 2012-01-20 LAB — HEPATIC FUNCTION PANEL
ALT: 13 U/L (ref 0–35)
AST: 19 U/L (ref 0–37)
Albumin: 3.7 g/dL (ref 3.5–5.2)
Alkaline Phosphatase: 67 U/L (ref 39–117)
Bilirubin, Direct: 0 mg/dL (ref 0.0–0.3)
Total Bilirubin: 0.5 mg/dL (ref 0.3–1.2)
Total Protein: 7 g/dL (ref 6.0–8.3)

## 2012-01-20 LAB — BASIC METABOLIC PANEL
BUN: 17 mg/dL (ref 6–23)
CO2: 30 mEq/L (ref 19–32)
Calcium: 9.3 mg/dL (ref 8.4–10.5)
Chloride: 105 mEq/L (ref 96–112)
Creatinine, Ser: 0.7 mg/dL (ref 0.4–1.2)
GFR: 101.79 mL/min (ref >60.00)
Glucose, Bld: 89 mg/dL (ref 70–99)
Potassium: 4 mEq/L (ref 3.5–5.1)
Sodium: 141 mEq/L (ref 135–145)

## 2012-01-20 NOTE — Patient Instructions (Signed)
Your physician wants you to follow-up in:6 months. You will receive a reminder letter in the mail two months in advance. If you don't receive a letter, please call our office to schedule the follow-up appointment.  Your physician has requested that you have a lower  arterial duplex. This test is an ultrasound of the arteries in the legs . It looks at arterial blood flow in the legs.. Allow one hour for Lower  Arterial scans. There are no restrictions or special instructions

## 2012-01-20 NOTE — Assessment & Plan Note (Signed)
She is known to have PAD with Lower ext disease followed by Dr. Hart Rochester. She has not been seen in [facility] since August 2011. She is having more burning in her feet. Will repeat LE arterial dopplers with ABI.

## 2012-01-20 NOTE — Progress Notes (Signed)
History of Present Illness: 71 yo female with history of CAD s/p inferior STEMI May 2011 treated with 3 DES in the proximal, mid and distal RCA, HTN, HLD, PAD, COPD here today for cardiac follow up. She has been followed in the past by Dr. Deborah Chalk and was most recently seen by Norma Fredrickson July 23, 2011. She is known to have anomalous takeoff of all coronaries. She had abnormal LE arterial dopplers in May 2011 and was seen by Dr. Betti Cruz in [facility] in August 2011.   She tells me today that she is doing well. She is not exercising but remains busy caring for her grandchildren. She denies chest pain and SOB.  She is tolerating her medicines. Blood pressure has been great at home. She does describe her feet becoming red, they do not hurt. No ulcers on feet or toes. No claudication. Her feet burn occasionally at night. Last  Her primary care is Dr. Charlynne Pander Blunt. Last lipids September 2012 with total cholesterol 148, HDL 62, LDL 69.    Past Medical History  Diagnosis Date  . STEMI (ST elevation myocardial infarction) May 2011    Promus DES in the proximal, mid and distal RCA in May 2011  . HTN (hypertension)   . Hyperlipidemia   . PVD (peripheral vascular disease)     Lower extremity Dopplers  May 2revealed an ABI of 0.83 in the right posterior   . COPD (chronic obstructive pulmonary disease)   . Hyperthyroidism     treated wtih radioactive iodine  . OA (osteoarthritis of spine)     Past Surgical History  Procedure Date  . Total hip arthroplasty 2004  . Abdominal hysterectomy     Had ovarian resection and required lysis of adhesions  . Cardiac catheterization 04/07/2010    EF 60-70%    Current Outpatient Prescriptions  Medication Sig Dispense Refill  . aspirin 81 MG tablet Take 81 mg by mouth daily.        Marland Kitchen atorvastatin (LIPITOR) 20 MG tablet Take 1 tablet (20 mg total) by mouth daily.  30 tablet  5  . hydrALAZINE (APRESOLINE) 25 MG tablet Take 1 tablet (25 mg total) by mouth daily.   30 tablet  11  . lisinopril-hydrochlorothiazide (PRINZIDE,ZESTORETIC) 20-25 MG per tablet TAKE ONE (1) TABLET(S) ONCE DAILY  30 tablet  5  . methimazole (TAPAZOLE) 5 MG tablet Take 5 mg by mouth daily.        . metoprolol (TOPROL-XL) 50 MG 24 hr tablet TAKE ONE (1) TABLET(S) DAILY  30 tablet  5  . nitroglycerin (NITROSTAT) 0.6 MG SL tablet Place 0.6 mg under the tongue every 5 (five) minutes as needed.        Marland Kitchen PLAVIX 75 MG tablet TAKE ONE (1) TABLET(S) DAILY  30 tablet  6    Allergies  Allergen Reactions  . Hydrocodone   . Iodides   . Shellfish-Derived Products     History   Social History  . Marital Status: Married    Spouse Name: N/A    Number of Children: 3  . Years of Education: N/A   Occupational History  . Retired    Social History Main Topics  . Smoking status: Former Smoker -- 1.0 packs/day for 45 years    Types: Cigarettes    Quit date: 04/07/2010  . Smokeless tobacco: Not on file  . Alcohol Use: No  . Drug Use: No  . Sexually Active: Not on file   Other Topics  Concern  . Not on file   Social History Narrative  . No narrative on file    Family History  Problem Relation Age of Onset  . Heart attack Father   . Peripheral vascular disease Mother     Review of Systems:  As stated in the HPI and otherwise negative.   BP 122/72  Pulse 72  Ht 5\' 1"  (1.549 m)  Wt 120 lb (54.432 kg)  BMI 22.67 kg/m2  Physical Examination: General: Well developed, well nourished, NAD HEENT: OP clear, mucus membranes moist SKIN: warm, dry. No rashes. Neuro: No focal deficits Musculoskeletal: Muscle strength 5/5 all ext Psychiatric: Mood and affect normal Neck: No JVD, no carotid bruits, no thyromegaly, no lymphadenopathy. Lungs:Clear bilaterally, no wheezes, rhonci, crackles Cardiovascular: Regular rate and rhythm. No murmurs, gallops or rubs. Abdomen:Soft. Bowel sounds present. Non-tender.  Extremities: No lower extremity edema. Pulses are difficult to palpate in  the bilateral DP/PT.  EKG: NSR, Non-specific T wave changes.

## 2012-01-20 NOTE — Assessment & Plan Note (Addendum)
Stable. Continue ASA, Plavix, statin and beta blocker. No changes. BP is at goal. Lipids have been well controlled. Last LDL 69.

## 2012-01-21 ENCOUNTER — Telehealth: Payer: Self-pay | Admitting: *Deleted

## 2012-01-21 NOTE — Telephone Encounter (Signed)
Advised patient of lab results  

## 2012-01-21 NOTE — Telephone Encounter (Signed)
Message copied by Royanne Foots on Wed Jan 21, 2012  3:12 PM ------      Message from: Rosalio Macadamia      Created: Tue Jan 20, 2012  1:33 PM       Ok to report. Labs are satisfactory.  Stay on same medicines. Recheck BMET/HPF/LIPIDS  in 6 months.

## 2012-02-09 ENCOUNTER — Encounter (INDEPENDENT_AMBULATORY_CARE_PROVIDER_SITE_OTHER): Payer: Medicare Other

## 2012-02-09 DIAGNOSIS — I739 Peripheral vascular disease, unspecified: Secondary | ICD-10-CM

## 2012-02-09 DIAGNOSIS — I2510 Atherosclerotic heart disease of native coronary artery without angina pectoris: Secondary | ICD-10-CM

## 2012-02-09 DIAGNOSIS — M79609 Pain in unspecified limb: Secondary | ICD-10-CM

## 2012-02-13 ENCOUNTER — Telehealth: Payer: Self-pay | Admitting: Cardiovascular Disease

## 2012-02-13 NOTE — Telephone Encounter (Signed)
Fu call °Patient returning your call °

## 2012-02-13 NOTE — Telephone Encounter (Signed)
Pt aware of ABI & [place] results Appt made for 03/01/12 Mylo Red RN

## 2012-02-26 ENCOUNTER — Other Ambulatory Visit: Payer: Self-pay | Admitting: Family Medicine

## 2012-02-26 ENCOUNTER — Ambulatory Visit
Admission: RE | Admit: 2012-02-26 | Discharge: 2012-02-26 | Disposition: A | Discharge status: Home / Self Care | Payer: Medicare Other | Source: Ambulatory Visit | Attending: Family Medicine | Admitting: Family Medicine

## 2012-02-26 DIAGNOSIS — R109 Unspecified abdominal pain: Secondary | ICD-10-CM

## 2012-03-01 ENCOUNTER — Ambulatory Visit (INDEPENDENT_AMBULATORY_CARE_PROVIDER_SITE_OTHER): Payer: Medicare Other | Admitting: Cardiovascular Disease

## 2012-03-01 ENCOUNTER — Encounter: Payer: Self-pay | Admitting: Cardiovascular Disease

## 2012-03-01 VITALS — BP 110/70 | HR 64 | Ht 61.5 in | Wt 119.1 lb

## 2012-03-01 DIAGNOSIS — I779 Disorder of arteries and arterioles, unspecified: Secondary | ICD-10-CM

## 2012-03-01 DIAGNOSIS — I739 Peripheral vascular disease, unspecified: Secondary | ICD-10-CM

## 2012-03-01 NOTE — Patient Instructions (Signed)
Your physician wants you to follow-up in: 6 months. You will receive a reminder letter in the mail two months in advance. If you don't receive a letter, please call our office to schedule the follow-up appointment.  Your physician has requested that you have an ankle brachial index (ABI). During this test an ultrasound and blood pressure cuff are used to evaluate the arteries that supply the arms and legs with blood. Allow thirty minutes for this exam. There are no restrictions or special instructions. To be done in 6 months.   

## 2012-03-01 NOTE — Progress Notes (Signed)
History of Present Illness: 71 yo female with history of CAD s/p inferior STEMI May 2011 treated with 3 DES in the proximal, mid and distal RCA, HTN, HLD, PAD, COPD here today for cardiac follow up. She has been followed in the past by Dr. Deborah Chalk and was most recently seen by Norma Fredrickson July 23, 2011. She is known to have anomalous takeoff of all coronaries. She had abnormal LE arterial dopplers in May 2011 and was seen by Dr. Betti Cruz in [facility] in August 2011. She has been doing well from a cardiac standpoint. At her last visit last month, she described both of her feet becoming red. They do not hurt. No ulcers on feet or toes. No claudication. Her feet burn occasionally at night.I ordered lower extremity arterial dopplers which showed ABI of 0.6 on the right and 0.77 on the left. There appears to be a moderate right distal SFA stenosis. She has no complaints today. She does have some burning in toes at night but no pain in calf muscles with walking. No rest pain or ulcerations.   Primary Care Physician: Dr. Charlynne Pander Blunt  Last Lipid Profile: September 2012 with total cholesterol 148, HDL 62, LDL 69.    Past Medical History  Diagnosis Date  . STEMI (ST elevation myocardial infarction) May 2011    Promus DES in the proximal, mid and distal RCA in May 2011  . HTN (hypertension)   . Hyperlipidemia   . PVD (peripheral vascular disease)     Lower extremity Dopplers  May 2revealed an ABI of 0.83 in the right posterior   . COPD (chronic obstructive pulmonary disease)   . Hyperthyroidism     treated wtih radioactive iodine  . OA (osteoarthritis of spine)     Past Surgical History  Procedure Date  . Total hip arthroplasty 2004    left  . Abdominal hysterectomy     Had ovarian resection and required lysis of adhesions  . Cardiac catheterization 04/07/2010    EF 60-70%    Current Outpatient Prescriptions  Medication Sig Dispense Refill  . aspirin 81 MG tablet Take 81 mg by mouth daily.         Marland Kitchen atorvastatin (LIPITOR) 20 MG tablet Take 1 tablet (20 mg total) by mouth daily.  30 tablet  5  . hydrALAZINE (APRESOLINE) 25 MG tablet Take 1 tablet (25 mg total) by mouth daily.  30 tablet  11  . lisinopril-hydrochlorothiazide (PRINZIDE,ZESTORETIC) 20-25 MG per tablet TAKE ONE (1) TABLET(S) ONCE DAILY  30 tablet  5  . methimazole (TAPAZOLE) 5 MG tablet Take 5 mg by mouth daily.        . metoprolol (TOPROL-XL) 50 MG 24 hr tablet TAKE ONE (1) TABLET(S) DAILY  30 tablet  5  . nitroglycerin (NITROSTAT) 0.6 MG SL tablet Place 0.6 mg under the tongue every 5 (five) minutes as needed.        Marland Kitchen PLAVIX 75 MG tablet TAKE ONE (1) TABLET(S) DAILY  30 tablet  6    Allergies  Allergen Reactions  . Hydrocodone   . Iodides   . Shellfish-Derived Products     History   Social History  . Marital Status: Married    Spouse Name: N/A    Number of Children: 3  . Years of Education: N/A   Occupational History  . Retired    Social History Main Topics  . Smoking status: Former Smoker -- 1.0 packs/day for 45 years    Types:  Cigarettes    Quit date: 04/07/2010  . Smokeless tobacco: Not on file  . Alcohol Use: No  . Drug Use: No  . Sexually Active: Not on file   Other Topics Concern  . Not on file   Social History Narrative  . No narrative on file    Family History  Problem Relation Age of Onset  . Heart attack Father   . Peripheral vascular disease Mother     Review of Systems:  As stated in the HPI and otherwise negative.   BP 110/70  Pulse 64  Ht 5' 1.5" (1.562 m)  Wt 119 lb 1.9 oz (54.032 kg)  BMI 22.14 kg/m2  Physical Examination: General: Well developed, well nourished, NAD HEENT: OP clear, mucus membranes moist SKIN: warm, dry. No rashes. Neuro: No focal deficits Musculoskeletal: Muscle strength 5/5 all ext Psychiatric: Mood and affect normal Neck: No JVD, no carotid bruits, no thyromegaly, no lymphadenopathy. Lungs:Clear bilaterally, no wheezes, rhonci,  crackles Cardiovascular: Regular rate and rhythm. No murmurs, gallops or rubs. Abdomen:Soft. Bowel sounds present. Non-tender.  Extremities: No lower extremity edema. Pulses are non-palpable right  DP/PT and trace left DP.  Lower ext arterial dopplers: Right ABI 0.60, Left ABI 0.77. Suggestion of moderate focal distal right SFA stenosis.

## 2012-03-01 NOTE — Assessment & Plan Note (Signed)
She has moderate reduction in right ABI and mild reduction in left ABI. She has no claudication, rest pain or ulcerations. She no longer smokes. Will encourage ambulation daily. Conservative management for now unless her clinical picture changes. Will talk to her about EUCLID. If she agrees, will stop ASA and Plavix and randomize to Plavix or Brilinta. Repeat ABI in 6 months.

## 2012-04-15 ENCOUNTER — Telehealth: Payer: Self-pay | Admitting: Cardiovascular Disease

## 2012-04-15 MED ORDER — CLOPIDOGREL BISULFATE 75 MG PO TABS: 75.0000 mg | ORAL_TABLET | Freq: Every day | ORAL | Status: DC

## 2012-04-15 NOTE — Telephone Encounter (Signed)
PT NEEDS REFILL OF PLAVIX 75MG  , [address]

## 2012-05-21 ENCOUNTER — Other Ambulatory Visit: Payer: Self-pay | Admitting: *Deleted

## 2012-05-21 MED ORDER — ATORVASTATIN CALCIUM 20 MG PO TABS: 20.0000 mg | ORAL_TABLET | Freq: Every day | ORAL | Status: DC

## 2012-06-02 ENCOUNTER — Other Ambulatory Visit: Payer: Self-pay

## 2012-06-02 MED ORDER — LISINOPRIL-HYDROCHLOROTHIAZIDE 20-25 MG PO TABS: 1.0000 | ORAL_TABLET | Freq: Every day | ORAL | Status: DC

## 2012-08-11 ENCOUNTER — Other Ambulatory Visit: Payer: Self-pay | Admitting: Nurse Practitioner

## 2012-09-01 ENCOUNTER — Ambulatory Visit (INDEPENDENT_AMBULATORY_CARE_PROVIDER_SITE_OTHER): Payer: Medicare Other | Admitting: Cardiovascular Disease

## 2012-09-01 ENCOUNTER — Encounter: Payer: Self-pay | Admitting: Cardiovascular Disease

## 2012-09-01 VITALS — BP 114/70 | HR 69 | Ht 61.0 in | Wt 122.0 lb

## 2012-09-01 DIAGNOSIS — I739 Peripheral vascular disease, unspecified: Secondary | ICD-10-CM

## 2012-09-01 DIAGNOSIS — I2510 Atherosclerotic heart disease of native coronary artery without angina pectoris: Secondary | ICD-10-CM

## 2012-09-01 NOTE — Patient Instructions (Signed)
Your physician wants you to follow-up in:  6 months. You will receive a reminder letter in the mail two months in advance. If you don't receive a letter, please call our office to schedule the follow-up appointment.  Your physician has requested that you have an ankle brachial index (ABI). During this test an ultrasound and blood pressure cuff are used to evaluate the arteries that supply the arms and legs with blood. Allow thirty minutes for this exam. There are no restrictions or special instructions.   

## 2012-09-01 NOTE — Progress Notes (Signed)
History of Present Illness: 71 yo female with history of CAD s/p inferior STEMI May 2011 treated with 3 DES in the proximal, mid and distal RCA, HTN, HLD, PAD, COPD here today for cardiac follow up. She has been followed in the past by Dr. Deborah Chalk.  She is known to have anomalous takeoff of all coronaries. She had abnormal LE arterial dopplers in May 2011 and was seen by Dr. Betti Cruz in [facility] in August 2011.  Lower extremity arterial dopplers May 2013 which showed ABI of 0.6 on the right and 0.77 on the left. Conservative management has been pursued with her LE arterial disease. There appears to be a moderate right distal SFA stenosis.  She is here today for cardiac and PV follow up. She has been doing well from a cardiac standpoint.  No chest pains. He has no exertional dyspnea. She has occasional SOB at rest. LV function normal 2011. Some pain in great toes but no calf pain with ambulation. No rest pain or ulcerations.   Primary Care Physician: Dr. Amalia Hailey   Last Lipid Profile:Lipid Panel     Component Value Date/Time   CHOL 164 01/20/2012 0834   TRIG 73.0 01/20/2012 0834   HDL 58.00 01/20/2012 0834   CHOLHDL 3 01/20/2012 0834   VLDL 14.6 01/20/2012 0834   LDLCALC 91 01/20/2012 0834     Past Medical History  Diagnosis Date  . STEMI (ST elevation myocardial infarction) May 2011    Promus DES in the proximal, mid and distal RCA in May 2011  . HTN (hypertension)   . Hyperlipidemia   . PVD (peripheral vascular disease)     Lower extremity Dopplers  May 2revealed an ABI of 0.83 in the right posterior   . COPD (chronic obstructive pulmonary disease)   . Hyperthyroidism     treated wtih radioactive iodine  . OA (osteoarthritis of spine)     Past Surgical History  Procedure Date  . Total hip arthroplasty 2004    left  . Abdominal hysterectomy     Had ovarian resection and required lysis of adhesions  . Cardiac catheterization 04/07/2010    EF 60-70%    Current Outpatient  Prescriptions  Medication Sig Dispense Refill  . aspirin 81 MG tablet Take 81 mg by mouth daily.        Marland Kitchen atorvastatin (LIPITOR) 20 MG tablet Take 1 tablet (20 mg total) by mouth daily.  30 tablet  5  . clopidogrel (PLAVIX) 75 MG tablet Take 1 tablet (75 mg total) by mouth daily.  30 tablet  6  . hydrALAZINE (APRESOLINE) 25 MG tablet Take 1 tablet (25 mg total) by mouth daily.  30 tablet  11  . lisinopril-hydrochlorothiazide (PRINZIDE,ZESTORETIC) 20-25 MG per tablet Take 1 tablet by mouth daily.  30 tablet  5  . methimazole (TAPAZOLE) 5 MG tablet Take 5 mg by mouth daily.        . metoprolol succinate (TOPROL-XL) 50 MG 24 hr tablet TAKE ONE TABLET BY MOUTH ONE TIME DAILY  30 tablet  0  . nitroglycerin (NITROSTAT) 0.6 MG SL tablet Place 0.6 mg under the tongue every 5 (five) minutes as needed.          Allergies  Allergen Reactions  . Hydrocodone   . Iodides   . Shellfish-Derived Products     History   Social History  . Marital Status: Married    Spouse Name: N/A    Number of Children: 3  .  Years of Education: N/A   Occupational History  . Retired    Social History Main Topics  . Smoking status: Former Smoker -- 1.0 packs/day for 45 years    Types: Cigarettes    Quit date: 04/07/2010  . Smokeless tobacco: Not on file  . Alcohol Use: No  . Drug Use: No  . Sexually Active: Not on file   Other Topics Concern  . Not on file   Social History Narrative  . No narrative on file    Family History  Problem Relation Age of Onset  . Heart attack Father   . Peripheral vascular disease Mother     Review of Systems:  As stated in the HPI and otherwise negative.   BP 114/70  Pulse 69  Ht 5\' 1"  (1.549 m)  Wt 122 lb (55.339 kg)  BMI 23.05 kg/m2  Physical Examination: General: Well developed, well nourished, NAD HEENT: OP clear, mucus membranes moist SKIN: warm, dry. No rashes. Neuro: No focal deficits Musculoskeletal: Muscle strength 5/5 all ext Psychiatric: Mood and  affect normal Neck: No JVD, no carotid bruits, no thyromegaly, no lymphadenopathy. Lungs:Clear bilaterally, no wheezes, rhonci, crackles Cardiovascular: Regular rate and rhythm. No murmurs, gallops or rubs. Abdomen:Soft. Bowel sounds present. Non-tender.  Extremities: No lower extremity edema. Pulses are 2 + in the bilateral DP/PT.  Assessment and Plan:   1. PAD: She has moderate reduction in right ABI and mild reduction in left ABI. She has no claudication, rest pain or ulcerations. She no longer smokes. Will encourage ambulation daily. Conservative management for now unless her clinical picture changes. Repeat ABI.   2. CAD: Stable. Continue ASA/Plavix/statin/beta blocker.   3. Dyspnea: Her LV function has been normal. Her dyspnea is not exertional. May be related to underlying lung disease. Chest x-ray from June 2011 with evidence of COPD. She will need PFTs and repeat chest x-ray. Will ask her to discuss this with DR. Blount. Will route note to Dr. Bruna Potter.

## 2012-09-13 ENCOUNTER — Encounter (INDEPENDENT_AMBULATORY_CARE_PROVIDER_SITE_OTHER): Payer: Medicare Other

## 2012-09-13 DIAGNOSIS — I739 Peripheral vascular disease, unspecified: Secondary | ICD-10-CM

## 2012-10-07 ENCOUNTER — Other Ambulatory Visit: Payer: Self-pay | Admitting: *Deleted

## 2012-10-07 MED ORDER — METOPROLOL SUCCINATE ER 50 MG PO TB24: 50.0000 mg | ORAL_TABLET | Freq: Every day | ORAL | Status: DC

## 2012-12-10 ENCOUNTER — Other Ambulatory Visit: Payer: Self-pay | Admitting: Family Medicine

## 2012-12-10 ENCOUNTER — Ambulatory Visit
Admission: RE | Admit: 2012-12-10 | Discharge: 2012-12-10 | Disposition: A | Discharge status: Home / Self Care | Payer: Medicare Other | Source: Ambulatory Visit | Attending: Family Medicine | Admitting: Family Medicine

## 2012-12-10 DIAGNOSIS — J189 Pneumonia, unspecified organism: Secondary | ICD-10-CM

## 2012-12-10 DIAGNOSIS — J209 Acute bronchitis, unspecified: Secondary | ICD-10-CM

## 2013-01-18 ENCOUNTER — Telehealth: Payer: Self-pay | Admitting: Cardiovascular Disease

## 2013-01-18 DIAGNOSIS — I10 Essential (primary) hypertension: Secondary | ICD-10-CM

## 2013-01-18 MED ORDER — HYDRALAZINE HCL 25 MG PO TABS: 25.0000 mg | ORAL_TABLET | Freq: Every day | ORAL | Status: DC

## 2013-01-18 NOTE — Telephone Encounter (Signed)
Pt needs to continue  hydralazine , if so needs refill [facility] [employer]

## 2013-01-18 NOTE — Telephone Encounter (Signed)
Spoke with pt's husband and told him pt should continue hydralazine and that I would send refills to [facility]. He will have her call back if

## 2013-01-18 NOTE — Telephone Encounter (Signed)
Spoke with pt

## 2013-01-18 NOTE — Telephone Encounter (Signed)
Pt was calling you back because her husband answer the phone but he was sleep so she needs a call back

## 2013-01-18 NOTE — Telephone Encounter (Signed)
(  note continued) questions.

## 2013-02-01 ENCOUNTER — Other Ambulatory Visit: Payer: Self-pay

## 2013-02-01 MED ORDER — ATORVASTATIN CALCIUM 20 MG PO TABS: 20.0000 mg | ORAL_TABLET | Freq: Every day | ORAL | Status: DC

## 2013-02-14 ENCOUNTER — Other Ambulatory Visit: Payer: Self-pay

## 2013-02-14 MED ORDER — CLOPIDOGREL BISULFATE 75 MG PO TABS: 75.0000 mg | ORAL_TABLET | Freq: Every day | ORAL | Status: DC

## 2013-02-24 ENCOUNTER — Other Ambulatory Visit: Payer: Self-pay | Admitting: *Deleted

## 2013-02-24 MED ORDER — LISINOPRIL-HYDROCHLOROTHIAZIDE 20-25 MG PO TABS: 1.0000 | ORAL_TABLET | Freq: Every day | ORAL | Status: DC

## 2013-02-24 NOTE — Telephone Encounter (Signed)
Fax Received. Refill Completed. Kristine [name] (R.M.A)   

## 2013-03-03 ENCOUNTER — Encounter: Payer: Self-pay | Admitting: Cardiovascular Disease

## 2013-03-03 ENCOUNTER — Ambulatory Visit (INDEPENDENT_AMBULATORY_CARE_PROVIDER_SITE_OTHER): Payer: Medicare Other | Admitting: Cardiovascular Disease

## 2013-03-03 VITALS — BP 148/88 | HR 57 | Ht 62.0 in | Wt 123.0 lb

## 2013-03-03 DIAGNOSIS — I739 Peripheral vascular disease, unspecified: Secondary | ICD-10-CM

## 2013-03-03 DIAGNOSIS — I2510 Atherosclerotic heart disease of native coronary artery without angina pectoris: Secondary | ICD-10-CM

## 2013-03-03 MED ORDER — NITROGLYCERIN 0.6 MG SL SUBL: 0.6000 mg | SUBLINGUAL_TABLET | SUBLINGUAL | Status: DC | PRN

## 2013-03-03 NOTE — Patient Instructions (Addendum)
Your physician wants you to follow-up in:  6 months.  You will receive a reminder letter in the mail two months in advance. If you don't receive a letter, please call our office to schedule the follow-up appointment.  Your physician has requested that you have an ankle brachial index (ABI). During this test an ultrasound and blood pressure cuff are used to evaluate the arteries that supply the arms and legs with blood. Allow thirty minutes for this exam. There are no restrictions or special instructions. To be done in October 2014

## 2013-03-03 NOTE — Progress Notes (Signed)
History of Present Illness: 72 yo female with history of CAD s/p inferior STEMI May 2011 treated with 3 DES in the proximal, mid and distal RCA, HTN, HLD, PAD, COPD here today for cardiac follow up. She has been followed in the past by Dr. Deborah Chalk. She is known to have anomalous takeoff of all coronaries. She had abnormal LE arterial dopplers in May 2011 and was seen by Dr. Betti Cruz in [facility] in August 2011. Lower extremity arterial dopplers May 2013 which showed ABI of 0.6 on the right and 0.77 on the left. Conservative management has been pursued with her LE arterial disease. There appears to be a moderate right distal SFA stenosis.   She is here today for cardiac and PV follow up. She has been doing well from a cardiac standpoint. No chest pains. He has no exertional dyspnea. She has occasional SOB at rest. LV function normal 2011. Some pain in great toes but no calf pain with ambulation. No rest pain or ulcerations.   Primary Care Physician: Dr. Amalia Hailey   Last Lipid Profile:Lipid Panel     Component Value Date/Time   CHOL 164 01/20/2012 0834   TRIG 73.0 01/20/2012 0834   HDL 58.00 01/20/2012 0834   CHOLHDL 3 01/20/2012 0834   VLDL 14.6 01/20/2012 0834   LDLCALC 91 01/20/2012 0834     Past Medical History  Diagnosis Date  . STEMI (ST elevation myocardial infarction) May 2011    Promus DES in the proximal, mid and distal RCA in May 2011  . HTN (hypertension)   . Hyperlipidemia   . PVD (peripheral vascular disease)     Lower extremity Dopplers  May 2revealed an ABI of 0.83 in the right posterior   . COPD (chronic obstructive pulmonary disease)   . Hyperthyroidism     treated wtih radioactive iodine  . OA (osteoarthritis of spine)     Past Surgical History  Procedure Laterality Date  . Total hip arthroplasty  2004    left  . Abdominal hysterectomy      Had ovarian resection and required lysis of adhesions  . Cardiac catheterization  04/07/2010    EF 60-70%    Current  Outpatient Prescriptions  Medication Sig Dispense Refill  . aspirin 81 MG tablet Take 81 mg by mouth daily.        Marland Kitchen atorvastatin (LIPITOR) 20 MG tablet Take 1 tablet (20 mg total) by mouth daily.  30 tablet  5  . clopidogrel (PLAVIX) 75 MG tablet Take 1 tablet (75 mg total) by mouth daily.  30 tablet  2  . hydrALAZINE (APRESOLINE) 25 MG tablet Take 1 tablet (25 mg total) by mouth daily.  30 tablet  11  . lisinopril-hydrochlorothiazide (PRINZIDE,ZESTORETIC) 20-25 MG per tablet Take 1 tablet by mouth daily.  30 tablet  5  . methimazole (TAPAZOLE) 5 MG tablet Take 5 mg by mouth daily.        . metoprolol succinate (TOPROL-XL) 50 MG 24 hr tablet Take 1 tablet (50 mg total) by mouth daily. Take with or immediately following a meal.  30 tablet  6  . nitroglycerin (NITROSTAT) 0.6 MG SL tablet Place 0.6 mg under the tongue every 5 (five) minutes as needed.         No current facility-administered medications for this visit.    Allergies  Allergen Reactions  . Hydrocodone   . Iodides   . Shellfish-Derived Products     History   Social History  .  Marital Status: Married    Spouse Name: N/A    Number of Children: 3  . Years of Education: N/A   Occupational History  . Retired    Social History Main Topics  . Smoking status: Former Smoker -- 1.00 packs/day for 45 years    Types: Cigarettes    Quit date: 04/07/2010  . Smokeless tobacco: Not on file  . Alcohol Use: No  . Drug Use: No  . Sexually Active: Not on file   Other Topics Concern  . Not on file   Social History Narrative  . No narrative on file    Family History  Problem Relation Age of Onset  . Heart attack Father   . Peripheral vascular disease Mother     Review of Systems:  As stated in the HPI and otherwise negative.   BP 148/88  Pulse 57  Ht 5\' 2"  (1.575 m)  Wt 123 lb (55.792 kg)  BMI 22.49 kg/m2  Physical Examination: General: Well developed, well nourished, NAD HEENT: OP clear, mucus membranes  moist SKIN: warm, dry. No rashes. Neuro: No focal deficits Musculoskeletal: Muscle strength 5/5 all ext Psychiatric: Mood and affect normal Neck: No JVD, no carotid bruits, no thyromegaly, no lymphadenopathy. Lungs:Clear bilaterally, no wheezes, rhonci, crackles Cardiovascular: Regular rate and rhythm. No murmurs, gallops or rubs. Abdomen:Soft. Bowel sounds present. Non-tender.  Extremities: No lower extremity edema. Pulses are 2 + in the bilateral DP/PT.  EKG: Sinus bradycardia, rate 57 bpm. Non-specific T wave flattening diffusely.   ABI 10/13: Right 0.68. Left 0.96.  Chest x-ray 12/10/12: Cardiac silhouette is upper range of normal size. Cardiac silhouette is normal and shape. Ectasia and nonaneurysmal calcification of the thoracic aorta are seen. There is a low-lying diaphragm with flattening consistent with hyperinflation with element of COPD. There is minimal central peribronchial thickening. No pneumonia or peripheral infiltrates are seen. Minimal noncalcific apical pleural thickening is seen with fibrotic change. No pleural effusion is evident. There is mildly osteopenic appearance of the bones. Changes of degenerative disc disease and degenerative spondylosis are present.  IMPRESSION: Hyperinflation configuration consistent with element of COPD. Minimal central peribronchial thickening. This may be associated with bronchitis, asthma, and reactive airway disease. No acute superimposed infiltrative densities are seen. Stable chronic findings are detailed above.  Assessment and Plan:   1. PAD: Stable. She has moderate reduction in right ABI and mild reduction in left ABI 10/13. She has no claudication, rest pain or ulcerations. She no longer smokes. Will encourage ambulation daily. Conservative management for now unless her clinical picture changes. Repeat ABI per schedule in October 2014.  2. CAD: Stable. Continue ASA/Plavix/statin/beta blocker.   3. Dyspnea: Her LV  function has been normal. Her dyspnea is not exertional. Probably related to COPD. Per primary care.

## 2013-03-04 ENCOUNTER — Telehealth: Payer: Self-pay | Admitting: Cardiovascular Disease

## 2013-03-04 DIAGNOSIS — I2510 Atherosclerotic heart disease of native coronary artery without angina pectoris: Secondary | ICD-10-CM

## 2013-03-04 MED ORDER — NITROGLYCERIN 0.4 MG SL SUBL: 0.4000 mg | SUBLINGUAL_TABLET | SUBLINGUAL | Status: DC | PRN

## 2013-03-04 NOTE — Telephone Encounter (Signed)
New problem   Pharmacy need so clarification on the prescription for Nitrostat. Please call in reference to the prescription.

## 2013-03-04 NOTE — Telephone Encounter (Signed)
Spoke with pharmacy and clarified NTG dose should be 0.4 mg. They will make change.

## 2013-05-16 ENCOUNTER — Other Ambulatory Visit: Payer: Self-pay

## 2013-05-16 MED ORDER — CLOPIDOGREL BISULFATE 75 MG PO TABS: 75.0000 mg | ORAL_TABLET | Freq: Every day | ORAL | Status: DC

## 2013-05-17 ENCOUNTER — Other Ambulatory Visit: Payer: Self-pay | Admitting: *Deleted

## 2013-05-25 ENCOUNTER — Encounter (HOSPITAL_COMMUNITY): Payer: Self-pay | Admitting: Emergency Medicine

## 2013-05-25 ENCOUNTER — Emergency Department (HOSPITAL_COMMUNITY)
Admission: EM | Admit: 2013-05-25 | Discharge: 2013-05-25 | Disposition: A | Discharge status: Home / Self Care | Payer: Medicare Other | Attending: Emergency Medicine | Admitting: Emergency Medicine

## 2013-05-25 ENCOUNTER — Emergency Department (HOSPITAL_COMMUNITY): Payer: Medicare Other

## 2013-05-25 DIAGNOSIS — J449 Chronic obstructive pulmonary disease, unspecified: Secondary | ICD-10-CM | POA: Insufficient documentation

## 2013-05-25 DIAGNOSIS — I10 Essential (primary) hypertension: Secondary | ICD-10-CM | POA: Insufficient documentation

## 2013-05-25 DIAGNOSIS — S20212A Contusion of left front wall of thorax, initial encounter: Secondary | ICD-10-CM

## 2013-05-25 DIAGNOSIS — W06XXXA Fall from bed, initial encounter: Secondary | ICD-10-CM | POA: Insufficient documentation

## 2013-05-25 DIAGNOSIS — Z8679 Personal history of other diseases of the circulatory system: Secondary | ICD-10-CM | POA: Insufficient documentation

## 2013-05-25 DIAGNOSIS — S20219A Contusion of unspecified front wall of thorax, initial encounter: Secondary | ICD-10-CM | POA: Insufficient documentation

## 2013-05-25 DIAGNOSIS — I252 Old myocardial infarction: Secondary | ICD-10-CM | POA: Insufficient documentation

## 2013-05-25 DIAGNOSIS — S0003XA Contusion of scalp, initial encounter: Secondary | ICD-10-CM | POA: Insufficient documentation

## 2013-05-25 DIAGNOSIS — Z79899 Other long term (current) drug therapy: Secondary | ICD-10-CM | POA: Insufficient documentation

## 2013-05-25 DIAGNOSIS — Z87891 Personal history of nicotine dependence: Secondary | ICD-10-CM | POA: Insufficient documentation

## 2013-05-25 DIAGNOSIS — Z8639 Personal history of other endocrine, nutritional and metabolic disease: Secondary | ICD-10-CM | POA: Insufficient documentation

## 2013-05-25 DIAGNOSIS — E785 Hyperlipidemia, unspecified: Secondary | ICD-10-CM | POA: Insufficient documentation

## 2013-05-25 DIAGNOSIS — Z862 Personal history of diseases of the blood and blood-forming organs and certain disorders involving the immune mechanism: Secondary | ICD-10-CM | POA: Insufficient documentation

## 2013-05-25 DIAGNOSIS — Y92009 Unspecified place in unspecified non-institutional (private) residence as the place of occurrence of the external cause: Secondary | ICD-10-CM | POA: Insufficient documentation

## 2013-05-25 DIAGNOSIS — W19XXXA Unspecified fall, initial encounter: Secondary | ICD-10-CM

## 2013-05-25 DIAGNOSIS — Y9389 Activity, other specified: Secondary | ICD-10-CM | POA: Insufficient documentation

## 2013-05-25 DIAGNOSIS — Z7982 Long term (current) use of aspirin: Secondary | ICD-10-CM | POA: Insufficient documentation

## 2013-05-25 DIAGNOSIS — S1093XA Contusion of unspecified part of neck, initial encounter: Secondary | ICD-10-CM | POA: Insufficient documentation

## 2013-05-25 DIAGNOSIS — Z8739 Personal history of other diseases of the musculoskeletal system and connective tissue: Secondary | ICD-10-CM | POA: Insufficient documentation

## 2013-05-25 DIAGNOSIS — Z8673 Personal history of transient ischemic attack (TIA), and cerebral infarction without residual deficits: Secondary | ICD-10-CM | POA: Insufficient documentation

## 2013-05-25 DIAGNOSIS — J4489 Other specified chronic obstructive pulmonary disease: Secondary | ICD-10-CM | POA: Insufficient documentation

## 2013-05-25 DIAGNOSIS — S0093XA Contusion of unspecified part of head, initial encounter: Secondary | ICD-10-CM

## 2013-05-25 HISTORY — DX: Cerebral infarction, unspecified: I63.9

## 2013-05-25 MED ORDER — TRAMADOL HCL 50 MG PO TABS: 50.0000 mg | ORAL_TABLET | Freq: Four times a day (QID) | ORAL | Status: DC | PRN

## 2013-05-25 NOTE — ED Provider Notes (Signed)
History     CSN: [id number]  Arrival date & time 05/25/13  0025   First MD Initiated Contact with Patient 05/25/13 0104      Chief Complaint  Patient presents with  . Fall   HPI  History provided by the patient and spouse. The patient is a 72 year old female with history of hypertension, hyperlipidemia, COPD, PVD, CAD and previous CVA who presents with injuries after fall. Patient was asleep and accidentally rolled out of bed hitting her head on the nightstand as well as her left side and ribs. She has most prominent pains to her left side and rib area. Pain is worse with deep breath. She denies feeling any shortness of breath. She does report having some swelling to the top of the scalp and head which has improved. Denies any current headache. Denies any dizziness or weakness. No weakness or numbness in extremities. No speech changes. Denies any neck or back pains. No pain or injury to extremities. Patient has not used any treatment for the symptoms. Denies any other aggravating or alleviating factors. No other associated symptoms.    Past Medical History  Diagnosis Date  . STEMI (ST elevation myocardial infarction) May 2011    Promus DES in the proximal, mid and distal RCA in May 2011  . HTN (hypertension)   . Hyperlipidemia   . PVD (peripheral vascular disease)     Lower extremity Dopplers  May 2revealed an ABI of 0.83 in the right posterior   . COPD (chronic obstructive pulmonary disease)   . Hyperthyroidism     treated wtih radioactive iodine  . OA (osteoarthritis of spine)   . Stroke     Past Surgical History  Procedure Laterality Date  . Total hip arthroplasty  2004    left  . Abdominal hysterectomy      Had ovarian resection and required lysis of adhesions  . Cardiac catheterization  04/07/2010    EF 60-70%  . Coronary stents      Family History  Problem Relation Age of Onset  . Heart attack Father   . Peripheral vascular disease Mother     History  Substance  Use Topics  . Smoking status: Former Smoker -- 1.00 packs/day for 45 years    Types: Cigarettes    Quit date: 04/07/2010  . Smokeless tobacco: Not on file  . Alcohol Use: No    OB History   Grav Para Term Preterm Abortions TAB SAB Ect Mult Living                  Review of Systems  Neurological: Negative for weakness, numbness and headaches.  All other systems reviewed and are negative.    Allergies  Iodinated diagnostic agents; Shellfish-derived products; and Hydrocodone  Home Medications   Current Outpatient Rx  Name  Route  Sig  Dispense  Refill  . aspirin 81 MG tablet   Oral   Take 81 mg by mouth daily.           Marland Kitchen atorvastatin (LIPITOR) 20 MG tablet   Oral   Take 1 tablet (20 mg total) by mouth daily.   30 tablet   5   . clopidogrel (PLAVIX) 75 MG tablet   Oral   Take 1 tablet (75 mg total) by mouth daily.   30 tablet   1     .Marland KitchenPatient needs to contact office to schedule  App ...   . hydrALAZINE (APRESOLINE) 25 MG tablet   Oral  Take 1 tablet (25 mg total) by mouth daily.   30 tablet   11   . lisinopril-hydrochlorothiazide (PRINZIDE,ZESTORETIC) 20-25 MG per tablet   Oral   Take 1 tablet by mouth daily.   30 tablet   5   . methimazole (TAPAZOLE) 5 MG tablet   Oral   Take 5 mg by mouth daily.          . metoprolol succinate (TOPROL-XL) 50 MG 24 hr tablet   Oral   Take 1 tablet (50 mg total) by mouth daily. Take with or immediately following a meal.   30 tablet   6   . nitroGLYCERIN (NITROSTAT) 0.4 MG SL tablet   Sublingual   Place 1 tablet (0.4 mg total) under the tongue every 5 (five) minutes as needed.   25 tablet   6     BP 146/75  Temp(Src) 98.1 F (36.7 C) (Oral)  Resp 18  SpO2 98%  Physical Exam  Nursing note and vitals reviewed. Constitutional: She is oriented to person, place, and time. She appears well-developed and well-nourished. No distress.  HENT:  Head: Normocephalic.  Small soft hematoma to the left parietal  side of the scalp. No lacerations or other injuries. No step-offs or skull depression. No Battle sign or recommend ice and  Eyes: Conjunctivae and EOM are normal. Pupils are equal, round, and reactive to light.  Neck: Normal range of motion. Neck supple.  No cervical midline tenderness  Cardiovascular: Normal rate and regular rhythm.   Pulmonary/Chest: Effort normal and breath sounds normal. No respiratory distress. She has no wheezes. She has no rales.    Bruising and pain to the left lateral upper chest wall and axilla area over third and fourth ribs. No gross deformities. No crepitus. Lungs are clear throughout.  Abdominal: Soft.  Musculoskeletal: Normal range of motion. She exhibits no edema and no tenderness.  No spinous tenderness. Extremities without deformity or focal pains. Normal and equal strength throughout.  Neurological: She is alert and oriented to person, place, and time. She has normal strength. No cranial nerve deficit or sensory deficit. Gait normal.  Skin: Skin is warm and dry. No rash noted.  Psychiatric: She has a normal mood and affect. Her behavior is normal.    ED Course  Procedures   Dg Ribs Unilateral W/chest Left  05/25/2013   *RADIOLOGY REPORT*  Clinical Data: Larey Seat out of bed complaining of pain and swelling in the left ribs and axillary region.  LEFT RIBS AND CHEST - 3+ VIEW  Comparison: Chest x-ray 12/10/2012.  Findings: Lung volumes are normal.  No consolidative airspace disease.  No pleural effusions.  No pneumothorax.  No pulmonary nodule or mass noted.  Pulmonary vasculature and the cardiomediastinal silhouette are within normal limits. Mild bilateral apical pleuroparenchymal thickening, unchanged compared to the prior study, most compatible with chronic scarring. Atherosclerotic calcifications of the thoracic aorta.  Dedicated views of the left ribs demonstrate no definite acute displaced left-sided rib fractures.  IMPRESSION: 1.  No acute displaced left-sided  rib fractures. 2.  No radiographic evidence of acute cardiopulmonary disease. 3.  Atherosclerosis.   Original Report Authenticated By: Trudie Reed, M.D.   Ct Head Wo Contrast  05/25/2013   *RADIOLOGY REPORT*  Clinical Data: History of fall with injury to the head.  CT HEAD WITHOUT CONTRAST  Technique:  Contiguous axial images were obtained from the base of the skull through the vertex without contrast.  Comparison: Head CT 05/30/2010.  Findings: No acute  displaced skull fractures are identified.  No acute intracranial abnormality.  Specifically, no evidence of acute post-traumatic intracranial hemorrhage, no definite regions of acute/subacute cerebral ischemia, no focal mass, mass effect, hydrocephalus or abnormal intra or extra-axial fluid collections. The visualized paranasal sinuses and mastoids are well pneumatized.  IMPRESSION: 1.  No acute displaced skull fractures or acute intracranial abnormalities. 2.  The appearance of the brain is normal.   Original Report Authenticated By: Trudie Reed, M.D.     1. Fall, initial encounter   2. Contusion of head, initial encounter   3. Contusion of ribs, left, initial encounter       MDM  2:20 AM patient seen and evaluated. Patient appears well in no acute distress. No focal neurologic deficits. She does have slight hematoma to the top of the scalp. No lacerations. Discussed options for CT of head to rule out any internal injuries and both family wished to have this performed.        Angus Seller, PA-C 05/25/13 [phone]

## 2013-05-25 NOTE — ED Notes (Signed)
Pt dc to home. Pt sts understanding to dc instructions. Pt ambulatory to exit without difficulty. 

## 2013-05-25 NOTE — ED Notes (Signed)
PT. FELL FROM BED THIS EVENING LANDED ON HARD WOOD FLOOR , NO LOC / AMBULATORY , RESPIRATIONS UNLABORED/ALERT AND ORIENTED. PT. REPORTS PAIN Cordella Register AT LEFT LOWER AXILLA  .

## 2013-05-25 NOTE — ED Provider Notes (Signed)
Medical screening examination/treatment/procedure(s) were conducted as a shared visit with non-physician practitioner(s) and myself.  I personally evaluated the patient during the encounter.  Pt s/p fall from bed.  Workup unremarkable, stable for d/c home  Olivia Mackie, MD 05/25/13 [phone]

## 2013-08-09 ENCOUNTER — Other Ambulatory Visit: Payer: Self-pay | Admitting: Cardiovascular Disease

## 2013-08-10 ENCOUNTER — Other Ambulatory Visit: Payer: Self-pay

## 2013-08-10 MED ORDER — METHIMAZOLE 5 MG PO TABS: 5.0000 mg | ORAL_TABLET | Freq: Every day | ORAL | Status: DC

## 2013-08-25 ENCOUNTER — Other Ambulatory Visit: Payer: Self-pay | Admitting: Cardiovascular Disease

## 2013-08-30 ENCOUNTER — Ambulatory Visit: Payer: Medicare Other | Admitting: Cardiovascular Disease

## 2013-09-01 ENCOUNTER — Encounter: Payer: Self-pay | Admitting: Cardiovascular Disease

## 2013-09-01 ENCOUNTER — Ambulatory Visit (INDEPENDENT_AMBULATORY_CARE_PROVIDER_SITE_OTHER): Payer: Medicare Other | Admitting: Cardiovascular Disease

## 2013-09-01 VITALS — BP 115/70 | HR 78 | Wt 125.0 lb

## 2013-09-01 DIAGNOSIS — E785 Hyperlipidemia, unspecified: Secondary | ICD-10-CM

## 2013-09-01 DIAGNOSIS — I10 Essential (primary) hypertension: Secondary | ICD-10-CM

## 2013-09-01 DIAGNOSIS — I739 Peripheral vascular disease, unspecified: Secondary | ICD-10-CM

## 2013-09-01 DIAGNOSIS — I2510 Atherosclerotic heart disease of native coronary artery without angina pectoris: Secondary | ICD-10-CM

## 2013-09-01 NOTE — Progress Notes (Signed)
History of Present Illness: 72 yo female with history of CAD s/p inferior STEMI May 2011 treated with 3 DES in the proximal, mid and distal RCA, HTN, HLD, PAD, COPD here today for cardiac follow up. She has been followed in the past by Dr. Deborah Chalk. She is known to have anomalous takeoff of all coronaries. She had abnormal LE arterial dopplers in May 2011 and was seen by Dr. Betti Cruz in [facility] in August 2011. Lower extremity arterial dopplers May 2013 which showed ABI of 0.6 on the right and 0.77 on the left. Conservative management has been pursued with her LE arterial disease. There appears to be a moderate right distal SFA stenosis.   She is here today for cardiac and PV follow up. She describes some central chest pressure. This does not occur after meals. Associated with SOB. Also having episodes of diaphoresis but not associated with the chest pain. LV function normal 2011. Some pain in great toes but no calf pain with ambulation. No rest pain or ulcerations.   Primary Care Physician: Dr. Amalia Hailey   Last Lipid Profile:Lipid Panel     Component Value Date/Time   CHOL 164 01/20/2012 0834   TRIG 73.0 01/20/2012 0834   HDL 58.00 01/20/2012 0834   CHOLHDL 3 01/20/2012 0834   VLDL 14.6 01/20/2012 0834   LDLCALC 91 01/20/2012 0834     Past Medical History  Diagnosis Date  . STEMI (ST elevation myocardial infarction) May 2011    Promus DES in the proximal, mid and distal RCA in May 2011  . HTN (hypertension)   . Hyperlipidemia   . PVD (peripheral vascular disease)     Lower extremity Dopplers  May 2revealed an ABI of 0.83 in the right posterior   . COPD (chronic obstructive pulmonary disease)   . Hyperthyroidism     treated wtih radioactive iodine  . OA (osteoarthritis of spine)   . Stroke     Past Surgical History  Procedure Laterality Date  . Total hip arthroplasty  2004    left  . Abdominal hysterectomy      Had ovarian resection and required lysis of adhesions  . Cardiac  catheterization  04/07/2010    EF 60-70%  . Coronary stents      Current Outpatient Prescriptions  Medication Sig Dispense Refill  . aspirin 81 MG tablet Take 81 mg by mouth daily.        Marland Kitchen atorvastatin (LIPITOR) 20 MG tablet Take 1 tablet (20 mg total) by mouth daily.  30 tablet  5  . clopidogrel (PLAVIX) 75 MG tablet TAKE 1 TABLET BY MOUTH EVERY DAY  30 tablet  0  . hydrALAZINE (APRESOLINE) 25 MG tablet Take 1 tablet (25 mg total) by mouth daily.  30 tablet  11  . lisinopril-hydrochlorothiazide (PRINZIDE,ZESTORETIC) 20-25 MG per tablet Take 1 tablet by mouth daily.  30 tablet  5  . methimazole (TAPAZOLE) 5 MG tablet Take 1 tablet (5 mg total) by mouth daily.  90 tablet  3  . metoprolol succinate (TOPROL-XL) 50 MG 24 hr tablet TAKE ONE TABLET BY MOUTH ONE TIME DAILY WITH FOOD OR IMMEDIATELY FOLLOWING A MEAL  30 tablet  0  . nitroGLYCERIN (NITROSTAT) 0.4 MG SL tablet Place 1 tablet (0.4 mg total) under the tongue every 5 (five) minutes as needed.  25 tablet  6   No current facility-administered medications for this visit.    Allergies  Allergen Reactions  . Iodinated Diagnostic Agents Anaphylaxis  .  Shellfish-Derived Products Anaphylaxis  . Hydrocodone Other (See Comments)    unknown    History   Social History  . Marital Status: Married    Spouse Name: N/A    Number of Children: 3  . Years of Education: N/A   Occupational History  . Retired    Social History Main Topics  . Smoking status: Former Smoker -- 1.00 packs/day for 45 years    Types: Cigarettes    Quit date: 04/07/2010  . Smokeless tobacco: Not on file  . Alcohol Use: No  . Drug Use: No  . Sexual Activity: Not on file   Other Topics Concern  . Not on file   Social History Narrative  . No narrative on file    Family History  Problem Relation Age of Onset  . Heart attack Father   . Peripheral vascular disease Mother     Review of Systems:  As stated in the HPI and otherwise negative.   BP 115/70   Pulse 78  Wt 125 lb (56.7 kg)  BMI 22.86 kg/m2  Physical Examination: General: Well developed, well nourished, NAD HEENT: OP clear, mucus membranes moist SKIN: warm, dry. No rashes. Neuro: No focal deficits Musculoskeletal: Muscle strength 5/5 all ext Psychiatric: Mood and affect normal Neck: No JVD, no carotid bruits, no thyromegaly, no lymphadenopathy. Lungs:Clear bilaterally, no wheezes, rhonci, crackles Cardiovascular: Regular rate and rhythm. No murmurs, gallops or rubs. Abdomen:Soft. Bowel sounds present. Non-tender.  Extremities: No lower extremity edema. Pulses are trace in the bilateral DP/PT.  ABI 10/13: Right 0.68. Left 0.96.  Chest x-ray 12/10/12: Cardiac silhouette is upper range of normal size. Cardiac silhouette is normal and shape. Ectasia and nonaneurysmal calcification of the thoracic aorta are seen. There is a low-lying diaphragm with flattening consistent with hyperinflation with element of COPD. There is minimal central peribronchial thickening. No pneumonia or peripheral infiltrates are seen. Minimal noncalcific apical pleural thickening is seen with fibrotic change. No pleural effusion is evident. There is mildly osteopenic appearance of the bones. Changes of degenerative disc disease and degenerative spondylosis are present.  IMPRESSION: Hyperinflation configuration consistent with element of COPD. Minimal central peribronchial thickening. This may be associated with bronchitis, asthma, and reactive airway disease. No acute superimposed infiltrative densities are seen. Stable chronic findings are detailed above.  Assessment and Plan:   1. PAD: Stable. She has moderate reduction in right ABI and mild reduction in left ABI 10/13. She has no claudication, rest pain or ulcerations. She no longer smokes. Will encourage ambulation daily. Conservative management for now unless her clinical picture changes. Repeat ABI per schedule now.   2. Chest pain/CAD:  Recent chest pains. Cannot exclude angina. Will arrange Lexiscan stress myoview to exclude ischemia.  Continue ASA/Plavix/statin/beta blocker.   3. Dyspnea: Her LV function has been normal. Her dyspnea is not exertional. Probably related to COPD. Per primary care.

## 2013-09-01 NOTE — Patient Instructions (Addendum)
Your physician wants you to follow-up in:  6 months. You will receive a reminder letter in the mail two months in advance. If you don't receive a letter, please call our office to schedule the follow-up appointment.   Your physician has requested that you have an ankle brachial index (ABI). During this test an ultrasound and blood pressure cuff are used to evaluate the arteries that supply the arms and legs with blood. Allow thirty minutes for this exam. There are no restrictions or special instructions.   Your physician has requested that you have a lexiscan myoview. For further information please visit [email or web address] Please follow instruction sheet, as given.

## 2013-09-21 ENCOUNTER — Ambulatory Visit (HOSPITAL_COMMUNITY): Payer: Medicare Other | Attending: Cardiology

## 2013-09-21 DIAGNOSIS — I2510 Atherosclerotic heart disease of native coronary artery without angina pectoris: Secondary | ICD-10-CM | POA: Insufficient documentation

## 2013-09-21 DIAGNOSIS — J449 Chronic obstructive pulmonary disease, unspecified: Secondary | ICD-10-CM | POA: Insufficient documentation

## 2013-09-21 DIAGNOSIS — Z87891 Personal history of nicotine dependence: Secondary | ICD-10-CM | POA: Insufficient documentation

## 2013-09-21 DIAGNOSIS — I739 Peripheral vascular disease, unspecified: Secondary | ICD-10-CM | POA: Insufficient documentation

## 2013-09-21 DIAGNOSIS — E785 Hyperlipidemia, unspecified: Secondary | ICD-10-CM | POA: Insufficient documentation

## 2013-09-21 DIAGNOSIS — I10 Essential (primary) hypertension: Secondary | ICD-10-CM | POA: Insufficient documentation

## 2013-09-21 DIAGNOSIS — Z8673 Personal history of transient ischemic attack (TIA), and cerebral infarction without residual deficits: Secondary | ICD-10-CM | POA: Insufficient documentation

## 2013-09-21 DIAGNOSIS — J4489 Other specified chronic obstructive pulmonary disease: Secondary | ICD-10-CM | POA: Insufficient documentation

## 2013-09-25 ENCOUNTER — Other Ambulatory Visit: Payer: Self-pay | Admitting: Cardiovascular Disease

## 2013-10-05 ENCOUNTER — Ambulatory Visit (HOSPITAL_COMMUNITY): Payer: Medicare Other | Attending: Cardiovascular Disease | Admitting: Radiology

## 2013-10-05 VITALS — BP 125/72 | HR 59 | Ht 61.5 in | Wt 126.0 lb

## 2013-10-05 DIAGNOSIS — I739 Peripheral vascular disease, unspecified: Secondary | ICD-10-CM | POA: Insufficient documentation

## 2013-10-05 DIAGNOSIS — R079 Chest pain, unspecified: Secondary | ICD-10-CM

## 2013-10-05 DIAGNOSIS — Z8249 Family history of ischemic heart disease and other diseases of the circulatory system: Secondary | ICD-10-CM | POA: Insufficient documentation

## 2013-10-05 DIAGNOSIS — I252 Old myocardial infarction: Secondary | ICD-10-CM | POA: Insufficient documentation

## 2013-10-05 DIAGNOSIS — Z87891 Personal history of nicotine dependence: Secondary | ICD-10-CM | POA: Insufficient documentation

## 2013-10-05 DIAGNOSIS — R0789 Other chest pain: Secondary | ICD-10-CM | POA: Insufficient documentation

## 2013-10-05 DIAGNOSIS — I10 Essential (primary) hypertension: Secondary | ICD-10-CM | POA: Insufficient documentation

## 2013-10-05 DIAGNOSIS — E785 Hyperlipidemia, unspecified: Secondary | ICD-10-CM | POA: Insufficient documentation

## 2013-10-05 DIAGNOSIS — I2510 Atherosclerotic heart disease of native coronary artery without angina pectoris: Secondary | ICD-10-CM

## 2013-10-05 MED ORDER — AMINOPHYLLINE 25 MG/ML IV SOLN
75.0000 mg | Freq: Once | INTRAVENOUS | Status: AC
  Administered 2013-10-05: 75 mg via INTRAVENOUS

## 2013-10-05 MED ORDER — TECHNETIUM TC 99M SESTAMIBI GENERIC - CARDIOLITE
11.0000 | Freq: Once | INTRAVENOUS | Status: AC | PRN
  Administered 2013-10-05: 11 via INTRAVENOUS

## 2013-10-05 MED ORDER — TECHNETIUM TC 99M SESTAMIBI GENERIC - CARDIOLITE
33.0000 | Freq: Once | INTRAVENOUS | Status: AC | PRN
  Administered 2013-10-05: 33 via INTRAVENOUS

## 2013-10-05 MED ORDER — REGADENOSON 0.4 MG/5ML IV SOLN
0.4000 mg | Freq: Once | INTRAVENOUS | Status: AC
  Administered 2013-10-05: 0.4 mg via INTRAVENOUS

## 2013-10-05 NOTE — Progress Notes (Signed)
Kristine [name], Kristine [name] is a 72 y.o. female     MRN : [id number]     DOB: [birth year]  Procedure Date: 10/05/2013  Nuclear Med Background Indication for Stress Test:  Evaluation for Ischemia and Stent Patency History:  MI, CAD, Stent to RCA, Echo 2011 EF 65-70% Cardiac Risk Factors: Family History - CAD, History of Smoking, Hypertension, Lipids and PVD  Symptoms:  Chest Pain (last date of chest discomfort was yesterday)   Nuclear Pre-Procedure Caffeine/Decaff Intake:  None NPO After: 7:00pm   Lungs:  clear O2 Sat: 96% on room air. IV 0.9% NS with Angio Cath:  22g  IV Site: R Hand  IV Started by:  Bonnita Levan, RN  Chest Size (in):  36 Cup Size: B  Height: 5' 1.5" (1.562 m)  Weight:  126 lb (57.153 kg)  BMI:  Body mass index is 23.42 kg/(m^2). Tech Comments:  Toprol held x 12 hours    Nuclear Med Study 1 or 2 day study: 1 day  Stress Test Type:  Lexiscan  Reading MD: Cassell Clement, MD  Order Authorizing Provider:  Verne Carrow, MD  Resting Radionuclide: Technetium 46m Sestamibi  Resting Radionuclide Dose: 11.0 mCi   Stress Radionuclide:  Technetium 66m Sestamibi  Stress Radionuclide Dose: 33.0 mCi           Stress Protocol Rest HR: 59 Stress HR: 98  Rest BP: 125/72 Stress BP: 138/91  Exercise Time (min): n/a METS: n/a   Predicted Max HR: 150 bpm % Max HR: 65.33 bpm Rate Pressure Product: 21308   Dose of Adenosine (mg):  n/a Dose of Lexiscan: 0.4 mg  Dose of Atropine (mg): n/a Dose of Dobutamine: n/a mcg/kg/min (at max HR)  Stress Test Technologist: Nelson Chimes, BS-ES  Nuclear Technologist:  Domenic Polite, CNMT     Rest Procedure:  Myocardial perfusion imaging was performed at rest 45 minutes following the intravenous administration of Technetium 38m Sestamibi. Rest ECG: NSR with non-specific ST-T wave changes  Stress Procedure:   The patient received IV Lexiscan 0.4 mg over 15-seconds.  Technetium 1m Sestamibi injected at 30-seconds.  Quantitative spect images were obtained after a 45 minute delay.  Patient complained of a weak feeling, chest tightness and stomach hurting.  75 mg aminophylline was given by Bonnita Levan, RN six minutes into recovery.  Symptoms subsided after a few minutes.  Stress ECG: No significant change from baseline ECG  QPS Raw Data Images:  Normal; no motion artifact; normal heart/lung ratio. Stress Images:  Normal homogeneous uptake in all areas of the myocardium. Rest Images:  Normal homogeneous uptake in all areas of the myocardium. Subtraction (SDS):  No evidence of ischemia. Transient Ischemic Dilatation (Normal <1.22):  0.97 Lung/Heart Ratio (Normal <0.45):  0.26  Quantitative Gated Spect Images QGS EDV:  55 ml QGS ESV:  16 ml  Impression Exercise Capacity:  Lexiscan with no exercise. BP Response:  Normal blood pressure response. Clinical Symptoms:  Mild chest pain/dyspnea. ECG Impression:  No significant ST segment change suggestive of ischemia. Comparison with Prior Nuclear Study: No previous nuclear study performed  Overall Impression:  Normal stress nuclear study.  LV Ejection Fraction: 71%.  LV Wall Motion:  NL LV Function; NL Wall Motion   [employer]

## 2013-10-10 ENCOUNTER — Telehealth: Payer: Self-pay | Admitting: Cardiovascular Disease

## 2013-10-10 NOTE — Telephone Encounter (Signed)
Follow up    Pt called back for her test results.

## 2013-10-10 NOTE — Telephone Encounter (Signed)
Reviewed results of stress test with patient who verbalized understanding and gratitude

## 2013-10-25 ENCOUNTER — Other Ambulatory Visit: Payer: Self-pay | Admitting: Cardiovascular Disease

## 2013-11-07 ENCOUNTER — Other Ambulatory Visit: Payer: Self-pay | Admitting: Cardiovascular Disease

## 2013-12-31 ENCOUNTER — Other Ambulatory Visit: Payer: Self-pay | Admitting: Cardiovascular Disease

## 2014-01-19 ENCOUNTER — Other Ambulatory Visit: Payer: Self-pay | Admitting: Cardiovascular Disease

## 2014-02-07 ENCOUNTER — Ambulatory Visit (INDEPENDENT_AMBULATORY_CARE_PROVIDER_SITE_OTHER): Payer: Medicare Other

## 2014-02-07 VITALS — BP 111/67 | HR 82 | Resp 12

## 2014-02-07 DIAGNOSIS — M79609 Pain in unspecified limb: Secondary | ICD-10-CM

## 2014-02-07 DIAGNOSIS — B351 Tinea unguium: Secondary | ICD-10-CM

## 2014-02-07 DIAGNOSIS — Q828 Other specified congenital malformations of skin: Secondary | ICD-10-CM

## 2014-02-07 DIAGNOSIS — I739 Peripheral vascular disease, unspecified: Secondary | ICD-10-CM

## 2014-02-07 MED ORDER — TAVABOROLE 5 % EX SOLN: CUTANEOUS | Status: DC

## 2014-02-07 NOTE — Progress Notes (Signed)
   Subjective:    Patient ID: Kristine [name], female    DOB: [birth year], 73 y.o.   MRN: [id number]  HPI  '' THE TOENAILS ARE THICK, HAVE DISCOLORATION, AND NEED TO BE TRIM. ALSO HAVE ALL THIS CORN/CALLUS ARE VERY PAINFUL FOR 3-4 YEARS. THE FEET GET AGGRAVATED BY PUTTING PRESSURE ON IT AND ITS GETTING WORSE. FOR THE TOENAILS TRIED ANTIFUNGAL TREATMENT AND IT HELPS.''    Review of Systems  Constitutional: Positive for unexpected weight change.  HENT: Positive for hearing loss and sneezing.   Eyes: Positive for pain.  Gastrointestinal: Positive for abdominal pain.  Endocrine: Positive for cold intolerance.  Skin: Positive for color change.  Allergic/Immunologic: Positive for food allergies.  Neurological: Positive for headaches.  All other systems reviewed and are negative.       Objective:   Physical Exam Look cardiovascular status as follows pedal pulses are dorsalis pedis thready at plus one over 4 bilateral PT nonpalpable bilateral temperature warm to cool. Turgor diminished there is mild varicosities noted no edema noted neurologically epicritic and proprioceptive sensations intact and symmetric bilateral there is normal plantar response DTRs not listed patient does have some burning in her feet capillary refill time proxy 4 seconds all digits. Dermatologically skin color pigment normal hair growth absent nails thick brittle friable deformed criptotic painful and tender both on palpation with enclosed shoe wear and ambulation. Patient also keratoses HD 5 bilateral diffuse keratoses sub-fifth MTP area right sub-fifth metatarsal base left and subsecond MTP area left. There is nucleated keratotic lesion extrinsic painful tender symptomatically patient wearing accommodative shoes.      Assessment & Plan:  Assessment patient has a history of PAD or PVD is being monitored a regular basis by [facility]. Patient will continue to do so as instructed. Patient does have thick brittle painful  mycotic nails with tenderness discomfort and friability mycotic nails x10 debridement at this time. Patient is a candidate for topical antifungal a prescription for Kerydin topical antifungal is dispensed through the mail order pharmacy. Patient will initiate topical treatment daily for 12 months duration as instructed. Patient also at this time multiple keratoses are debrided reappointed 3 months for followup continued palliative care and as-needed basis. Third toe right and fifth hammertoe left or to with lumicain and Neosporin following debridement.  Alvan Dameichard Brocha Gilliam DPM

## 2014-02-07 NOTE — Patient Instructions (Signed)
Onychomycosis/Fungal Toenails  WHAT IS IT? An infection that lies within the keratin of your nail plate that is caused by a fungus.  WHY ME? Fungal infections affect all ages, sexes, races, and creeds.  There may be many factors that predispose you to a fungal infection such as age, coexisting medical conditions such as diabetes, or an autoimmune disease; stress, medications, fatigue, genetics, etc.  Bottom line: fungus thrives in a warm, moist environment and your shoes offer such a location.  IS IT CONTAGIOUS? Theoretically, yes.  You do not want to share shoes, nail clippers or files with someone who has fungal toenails.  Walking around barefoot in the same room or sleeping in the same bed is unlikely to transfer the organism.  It is important to realize, however, that fungus can spread easily from one nail to the next on the same foot.  HOW DO WE TREAT THIS?  There are several ways to treat this condition.  Treatment may depend on many factors such as age, medications, pregnancy, liver and kidney conditions, etc.  It is best to ask your doctor which options are available to you.  1. No treatment.   Unlike many other medical concerns, you can live with this condition.  However for many people this can be a painful condition and may lead to ingrown toenails or a bacterial infection.  It is recommended that you keep the nails cut short to help reduce the amount of fungal nail. 2. Topical treatment.  These range from herbal remedies to prescription strength nail lacquers.  About 40-50% effective, topicals require twice daily application for approximately 9 to 12 months or until an entirely new nail has grown out.  The most effective topicals are medical grade medications available through physicians offices. 3. Oral antifungal medications.  With an 80-90% cure rate, the most common oral medication requires 3 to 4 months of therapy and stays in your system for a year as the new nail grows out.  Oral  antifungal medications do require blood work to make sure it is a safe drug for you.  A liver function panel will be performed prior to starting the medication and after the first month of treatment.  It is important to have the blood work performed to avoid any harmful side effects.  In general, this medication safe but blood work is required. 4. Laser Therapy.  This treatment is performed by applying a specialized laser to the affected nail plate.  This therapy is noninvasive, fast, and non-painful.  It is not covered by insurance and is therefore, out of pocket.  The results have been very good with a 80-95% cure rate.  The [facility] is the only practice in the area to offer this therapy. 5. Permanent Nail Avulsion.  Removing the entire nail so that a new nail will not grow back.  Your prescription has been forwarded to a mail in pharmacy which would deliver to your house the topical antifungal Keydin. You apply this one drop each nail daily for 12 months duration.

## 2014-02-23 ENCOUNTER — Other Ambulatory Visit: Payer: Self-pay | Admitting: Cardiovascular Disease

## 2014-03-29 ENCOUNTER — Other Ambulatory Visit: Payer: Self-pay | Admitting: Cardiovascular Disease

## 2014-04-03 ENCOUNTER — Telehealth: Payer: Self-pay | Admitting: *Deleted

## 2014-04-03 NOTE — Telephone Encounter (Signed)
Two packages of fungal treatment arrived, no invoice, wants to know who to pay.  I returned her call, left a message with her spouse that medication was filed with the insurance company.

## 2014-04-17 ENCOUNTER — Other Ambulatory Visit: Payer: Self-pay | Admitting: Cardiovascular Disease

## 2014-05-15 ENCOUNTER — Other Ambulatory Visit: Payer: Self-pay | Admitting: Cardiovascular Disease

## 2014-05-23 ENCOUNTER — Ambulatory Visit (INDEPENDENT_AMBULATORY_CARE_PROVIDER_SITE_OTHER): Payer: Medicare Other

## 2014-05-23 VITALS — BP 107/68 | HR 75 | Resp 15 | Ht 61.5 in | Wt 122.0 lb

## 2014-05-23 DIAGNOSIS — M79609 Pain in unspecified limb: Secondary | ICD-10-CM

## 2014-05-23 DIAGNOSIS — B351 Tinea unguium: Secondary | ICD-10-CM

## 2014-05-23 DIAGNOSIS — M479 Spondylosis, unspecified: Secondary | ICD-10-CM

## 2014-05-23 DIAGNOSIS — I739 Peripheral vascular disease, unspecified: Secondary | ICD-10-CM

## 2014-05-23 DIAGNOSIS — Q828 Other specified congenital malformations of skin: Secondary | ICD-10-CM

## 2014-05-23 NOTE — Progress Notes (Signed)
   Subjective:    Patient ID: Kristine [name], female    DOB: 06-May-1941, 73 y.o.   MRN: [id number]  HPI Comments: Pt states she watched Dr. Ralene CorkSikora trim her callouses last time and now she has created a mess on the left foot 1st MPJ plantar.  patient apparently to do self debridement with some originally from around name irritated the first MTP keratoses although there is no sign of infection no discharge or drainage no open wounds or ulcers noted    Review of Systems No new systemic changes or findings noted    Objective:   Physical Exam Vascular status is intact pedal pulses DP thready one over 4 PT nonpalpable temperature cool turgor diminished there is no edema no varicosities noted neurologically epicritic and proprioceptive sensations intact diminished and DTRs noted that with DTRs not elicited dermatologically skin color pigment normal also keratoses sub-1 and 4 left and sub-5 right are debrided at this time punctate nucleated porokeratosis. Also at this time debridement thick brittle crumbly friable gratified thick discolored nails 1 through 5 bilateral which are painful tender symptomatic with a debridement and on palpation patient is a history peripheral vascular compromise follows up with the pain vascular center as instructed. No new problems or difficulties at this time       Assessment & Plan:  Assessment history history PDD complications but diminished neurovascular status noted patient thick brittle painful mycotic nails 1 through 5 bilateral debridement at this time also debridement multiple keratotic lesions 3 lesions one right and 2 on the left or debridement patient will continue with self care and as-needed basis followup in 2-3 months for continued palliative care in the future and the presence of of vascular disease and compromise return for followup as needed  Alvan Dameichard Sikora DPM

## 2014-05-23 NOTE — Patient Instructions (Signed)

## 2014-05-26 ENCOUNTER — Other Ambulatory Visit: Payer: Self-pay | Admitting: Cardiology

## 2014-06-02 ENCOUNTER — Other Ambulatory Visit: Payer: Self-pay | Admitting: Cardiovascular Disease

## 2014-07-03 ENCOUNTER — Telehealth: Payer: Self-pay | Admitting: Cardiovascular Disease

## 2014-07-03 NOTE — Telephone Encounter (Signed)
Patient came into office today with her life insurance agent/friend. The agent needed clarification on two medications that the patient is taking as to what the meds are being used for.  Hydralazine: for CHF or HTN and Nitrostat. In regard to the Nitrostat for Angina, she wanted to know if the patient has had any angina sx's in the past two years. Please call patient to let her know if and when we can release this information to her (patient) so that she can give it to her ins.agent.

## 2014-07-04 ENCOUNTER — Telehealth: Payer: Self-pay | Admitting: Cardiovascular Disease

## 2014-07-04 ENCOUNTER — Encounter: Payer: Self-pay | Admitting: *Deleted

## 2014-07-04 MED ORDER — LISINOPRIL-HYDROCHLOROTHIAZIDE 20-25 MG PO TABS: ORAL_TABLET | ORAL | Status: DC

## 2014-07-04 MED ORDER — NITROGLYCERIN 0.4 MG SL SUBL: 0.4000 mg | SUBLINGUAL_TABLET | SUBLINGUAL | Status: DC | PRN

## 2014-07-04 MED ORDER — HYDRALAZINE HCL 25 MG PO TABS: ORAL_TABLET | ORAL | Status: DC

## 2014-07-04 MED ORDER — METOPROLOL SUCCINATE ER 50 MG PO TB24: ORAL_TABLET | ORAL | Status: DC

## 2014-07-04 MED ORDER — ATORVASTATIN CALCIUM 20 MG PO TABS: ORAL_TABLET | ORAL | Status: DC

## 2014-07-04 NOTE — Telephone Encounter (Signed)
Spoke with pt and told her Hydralazine is for hypertension. NTG is to be used as needed for chest pain. I reviewed chart and told her she complained of chest pain when she saw Dr. Clifton JamesMcAlhany in September 2014 and stress test was ordered. Stress test did not show ischemia.  Pt is asking if this can be written in a letter for her to give to her insurance. Will write letter and leave at front desk for pt to pick up tomorrow. She will let us know if additional paperwork needs to be completed. Pt due for appt. Appt made for pt to see Kristine ReedyMichele Lenze, Kristine [name] on July 31, 2014. NTG she has is old. Will send refills for this and other medicines to [facility] on [address].

## 2014-07-04 NOTE — Telephone Encounter (Signed)
I spoke with pt and told her we had [facility]. Pt reports this is her insurance agent. I told pt I did not return call to Ms. Little as we did not have authorization to speak with her. Pt thinks it is regarding fax number to send letter. Pt will call [facility].

## 2014-07-04 NOTE — Telephone Encounter (Signed)
Spoke with pt and explained to her what was written in letter regarding hydralazine, NTG, chest pains at office visit and stress test results. Pt requests I fax this letter to Anthony Sararol Little at [phone]. Will fax letter when completed.  Pt reports insurance agent may need additional information. I asked pt to have additional information needed sent to us in written form. Pt aware she may need to sign release of information for any additional paperwork to be completed.

## 2014-07-04 NOTE — Telephone Encounter (Signed)
FOLLOW UP:    Pt needs a call back please she has the information ready for RN.

## 2014-07-04 NOTE — Telephone Encounter (Signed)
New message          Kristine [name] is calling about pt medical statement / she says it is urgent

## 2014-07-06 ENCOUNTER — Encounter: Payer: Self-pay | Admitting: *Deleted

## 2014-07-06 NOTE — Telephone Encounter (Signed)
Pt came into office today. She is requesting updated [facility] indicating whether or not she has CHF. Chart reviewed and pt does not have CHF. Previous letter updated and copy given to pt. New letter faxed to Anthony Sararol Little per pt's request.

## 2014-07-25 ENCOUNTER — Encounter (HOSPITAL_COMMUNITY): Payer: Self-pay | Admitting: Emergency Medicine

## 2014-07-25 ENCOUNTER — Emergency Department (HOSPITAL_COMMUNITY)
Admission: EM | Admit: 2014-07-25 | Discharge: 2014-07-25 | Disposition: A | Discharge status: Home / Self Care | Payer: Medicare Other | Attending: Emergency Medicine | Admitting: Emergency Medicine

## 2014-07-25 DIAGNOSIS — I252 Old myocardial infarction: Secondary | ICD-10-CM | POA: Insufficient documentation

## 2014-07-25 DIAGNOSIS — Z79899 Other long term (current) drug therapy: Secondary | ICD-10-CM | POA: Insufficient documentation

## 2014-07-25 DIAGNOSIS — I10 Essential (primary) hypertension: Secondary | ICD-10-CM | POA: Insufficient documentation

## 2014-07-25 DIAGNOSIS — Z7982 Long term (current) use of aspirin: Secondary | ICD-10-CM | POA: Insufficient documentation

## 2014-07-25 DIAGNOSIS — Z8673 Personal history of transient ischemic attack (TIA), and cerebral infarction without residual deficits: Secondary | ICD-10-CM | POA: Insufficient documentation

## 2014-07-25 DIAGNOSIS — E785 Hyperlipidemia, unspecified: Secondary | ICD-10-CM | POA: Insufficient documentation

## 2014-07-25 DIAGNOSIS — R220 Localized swelling, mass and lump, head: Secondary | ICD-10-CM

## 2014-07-25 DIAGNOSIS — R221 Localized swelling, mass and lump, neck: Secondary | ICD-10-CM | POA: Diagnosis present

## 2014-07-25 DIAGNOSIS — J4489 Other specified chronic obstructive pulmonary disease: Secondary | ICD-10-CM | POA: Insufficient documentation

## 2014-07-25 DIAGNOSIS — M479 Spondylosis, unspecified: Secondary | ICD-10-CM | POA: Diagnosis not present

## 2014-07-25 DIAGNOSIS — Z87891 Personal history of nicotine dependence: Secondary | ICD-10-CM | POA: Diagnosis not present

## 2014-07-25 DIAGNOSIS — J449 Chronic obstructive pulmonary disease, unspecified: Secondary | ICD-10-CM | POA: Insufficient documentation

## 2014-07-25 MED ORDER — DIPHENHYDRAMINE HCL 25 MG PO CAPS
25.0000 mg | ORAL_CAPSULE | Freq: Once | ORAL | Status: AC
  Administered 2014-07-25: 25 mg via ORAL
  Filled 2014-07-25: qty 1

## 2014-07-25 MED ORDER — PENICILLIN V POTASSIUM 500 MG PO TABS: 500.0000 mg | ORAL_TABLET | Freq: Three times a day (TID) | ORAL | Status: AC

## 2014-07-25 MED ORDER — PENICILLIN V POTASSIUM 500 MG PO TABS
500.0000 mg | ORAL_TABLET | Freq: Once | ORAL | Status: AC
  Administered 2014-07-25: 500 mg via ORAL
  Filled 2014-07-25: qty 1

## 2014-07-25 MED ORDER — DIPHENHYDRAMINE HCL 25 MG PO TABS: 25.0000 mg | ORAL_TABLET | Freq: Four times a day (QID) | ORAL | Status: DC

## 2014-07-25 NOTE — ED Notes (Signed)
Patient states she felt a stinging on the right ear. Patient noted she had slight swelling to the right outer ear. Patient states 2 hours later she noted she had swelling to the right lower jaw. Patient states she has no swallowin g or breathing difficulties.

## 2014-07-29 NOTE — ED Provider Notes (Signed)
CSN: [id number]     Arrival date & time 07/25/14  1228 History   First MD Initiated Contact with Patient 07/25/14 1303     Chief Complaint  Patient presents with  . Facial Swelling      HPI Patient states that several hours ago she noted mild swelling to her right lower jaw.  She has no difficulty breathing.  She denies dental pain.  She does states initially she began with a stinging and burning-like pain around her right ear.  No history of angioedema.  No history of allergic reaction or anaphylaxis.  No difficulty speaking.  No neck pain.  No recent injury, fall, blunt trauma.  She has not tried anything at home for the swelling.  She denies warmth or redness of her face.  No fevers or chills.  No new rashes anywhere.  Past Medical History  Diagnosis Date  . STEMI (ST elevation myocardial infarction) May 2011    Promus DES in the proximal, mid and distal RCA in May 2011  . HTN (hypertension)   . Hyperlipidemia   . PVD (peripheral vascular disease)     Lower extremity Dopplers  May 2revealed an ABI of 0.83 in the right posterior   . COPD (chronic obstructive pulmonary disease)   . Hyperthyroidism     treated wtih radioactive iodine  . OA (osteoarthritis of spine)   . Stroke    Past Surgical History  Procedure Laterality Date  . Total hip arthroplasty  2004    left  . Abdominal hysterectomy      Had ovarian resection and required lysis of adhesions  . Cardiac catheterization  04/07/2010    EF 60-70%  . Coronary stents     Family History  Problem Relation Age of Onset  . Heart attack Father   . Peripheral vascular disease Mother    History  Substance Use Topics  . Smoking status: Former Smoker -- 1.00 packs/day for 45 years    Types: Cigarettes    Quit date: 04/07/2010  . Smokeless tobacco: Not on file  . Alcohol Use: No   OB History   Grav Para Term Preterm Abortions TAB SAB Ect Mult Living                 Review of Systems  All other systems reviewed and are  negative.     Allergies  Iodinated diagnostic agents; Shellfish-derived products; and Hydrocodone  Home Medications   Prior to Admission medications   Medication Sig Start Date End Date Taking? Authorizing Provider  aspirin 81 MG tablet Take 81 mg by mouth daily.     Yes Historical Provider, MD  atorvastatin (LIPITOR) 20 MG tablet Take 20 mg by mouth daily.   Yes Historical Provider, MD  clopidogrel (PLAVIX) 75 MG tablet Take 75 mg by mouth daily.   Yes Historical Provider, MD  hydrALAZINE (APRESOLINE) 25 MG tablet Take 25 mg by mouth daily.   Yes Historical Provider, MD  lisinopril-hydrochlorothiazide (PRINZIDE,ZESTORETIC) 20-25 MG per tablet Take 1 tablet by mouth daily.   Yes Historical Provider, MD  methimazole (TAPAZOLE) 5 MG tablet Take 5 mg by mouth daily.   Yes Historical Provider, MD  metoprolol succinate (TOPROL-XL) 50 MG 24 hr tablet Take 50 mg by mouth daily. Take with or immediately following a meal.   Yes Historical Provider, MD  nitroGLYCERIN (NITROSTAT) 0.4 MG SL tablet Place 0.4 mg under the tongue every 5 (five) minutes as needed for chest pain.   Yes Historical  Provider, MD  PROAIR HFA 108 (90 BASE) MCG/ACT inhaler Inhale 1 puff into the lungs every 6 (six) hours as needed for wheezing or shortness of breath.  10/31/13  Yes Historical Provider, MD  Tavaborole (KERYDIN) 5 % SOLN Apply 1 drop to each affected toenail once daily for 12 months 02/07/14  Yes Alvan Dame, DPM  diphenhydrAMINE (BENADRYL) 25 MG tablet Take 1 tablet (25 mg total) by mouth every 6 (six) hours. 07/25/14   Lyanne Co, MD  penicillin v potassium (VEETID) 500 MG tablet Take 1 tablet (500 mg total) by mouth 3 (three) times daily. 07/25/14 08/01/14  Lyanne Co, MD   BP 121/73  Pulse 72  Temp(Src) 98.2 F (36.8 C) (Oral)  Resp 16  Ht 5' 1.5" (1.562 m)  Wt 136 lb (61.689 kg)  BMI 25.28 kg/m2  SpO2 100% Physical Exam  Nursing note and vitals reviewed. Constitutional: She is oriented to  person, place, and time. She appears well-developed and well-nourished.  HENT:  Head: Normocephalic.  Focal isolated swelling of the soft tissue overlying the angle of the mandible.  No intraoral lesions or swelling noted.  No gingival swelling or masses.  No focal dental trauma or tenderness.  A small defect in the dental enamel is present on tooth #29.  This tooth is nontender.  No trismus or malocclusion.  Eyes: EOM are normal.  Neck: Normal range of motion. Neck supple. No tracheal deviation present. No thyromegaly present.  No overlying skin changes of the anterior neck.  Pulmonary/Chest: Effort normal. No stridor.  Abdominal: She exhibits no distension.  Musculoskeletal: Normal range of motion.  Lymphadenopathy:    She has no cervical adenopathy.  Neurological: She is alert and oriented to person, place, and time.  Psychiatric: She has a normal mood and affect.    ED Course  Procedures (including critical care time) Labs Review Labs Reviewed - No data to display  Imaging Review No results found.   EKG Interpretation None      MDM   Final diagnoses:  Facial swelling    Unclear etiology of the acute swelling.  Doesn't look infectious in nature as there is no fluctuance or redness of the skin.  No intraoral lesions are present.  I suppose this could represent focal allergic reaction.  Facial be given Benadryl and told to apply ice to the region.  This could represent early developing dental infection with some facial swelling.  Facial be started on penicillin.  She has no difficulty breathing or swallowing at this time.  She has no other systemic symptoms.  She would discharge home to monitor this closely at home.  Have encouraged her to return to the ER for any new or worsening symptoms.  She is educated, has resources, has transportation    Lyanne Co, MD 07/29/14 [phone]

## 2014-07-31 ENCOUNTER — Ambulatory Visit (INDEPENDENT_AMBULATORY_CARE_PROVIDER_SITE_OTHER): Payer: Medicare Other | Admitting: Physician Assistant

## 2014-07-31 ENCOUNTER — Encounter: Payer: Self-pay | Admitting: Physician Assistant

## 2014-07-31 ENCOUNTER — Other Ambulatory Visit (HOSPITAL_COMMUNITY): Payer: Self-pay | Admitting: Cardiology

## 2014-07-31 VITALS — BP 109/68 | HR 76 | Ht 61.5 in | Wt 129.8 lb

## 2014-07-31 DIAGNOSIS — E785 Hyperlipidemia, unspecified: Secondary | ICD-10-CM

## 2014-07-31 DIAGNOSIS — I739 Peripheral vascular disease, unspecified: Secondary | ICD-10-CM

## 2014-07-31 DIAGNOSIS — I10 Essential (primary) hypertension: Secondary | ICD-10-CM

## 2014-07-31 DIAGNOSIS — I2510 Atherosclerotic heart disease of native coronary artery without angina pectoris: Secondary | ICD-10-CM

## 2014-07-31 LAB — COMPREHENSIVE METABOLIC PANEL
ALBUMIN: 3.6 g/dL (ref 3.5–5.2)
ALK PHOS: 66 U/L (ref 39–117)
ALT: 10 U/L (ref 0–35)
AST: 20 U/L (ref 0–37)
BILIRUBIN TOTAL: 0.4 mg/dL (ref 0.2–1.2)
BUN: 14 mg/dL (ref 6–23)
CO2: 28 mEq/L (ref 19–32)
Calcium: 9.3 mg/dL (ref 8.4–10.5)
Chloride: 103 mEq/L (ref 96–112)
Creatinine, Ser: 0.6 mg/dL (ref 0.4–1.2)
GFR: 122.01 mL/min (ref >60.00)
Glucose, Bld: 70 mg/dL (ref 70–99)
POTASSIUM: 3.9 meq/L (ref 3.5–5.1)
Sodium: 138 mEq/L (ref 135–145)
Total Protein: 7.2 g/dL (ref 6.0–8.3)

## 2014-07-31 LAB — CBC WITH DIFFERENTIAL/PLATELET
BASOS PCT: 2.2 % (ref 0.0–3.0)
Basophils Absolute: 0.1 10*3/uL (ref 0.0–0.1)
Eosinophils Absolute: 0.2 10*3/uL (ref 0.0–0.7)
Eosinophils Relative: 4.1 % (ref 0.0–5.0)
HCT: 30 % — ABNORMAL LOW (ref 36.0–46.0)
Hemoglobin: 9.6 g/dL — ABNORMAL LOW (ref 12.0–15.0)
LYMPHS PCT: 25.3 % (ref 12.0–46.0)
Lymphs Abs: 1.5 10*3/uL (ref 0.7–4.0)
MCHC: 31.9 g/dL (ref 30.0–36.0)
MCV: 81.3 fl (ref 78.0–100.0)
MONO ABS: 0.5 10*3/uL (ref 0.1–1.0)
Monocytes Relative: 8.7 % (ref 3.0–12.0)
NEUTROS PCT: 59.7 % (ref 43.0–77.0)
Neutro Abs: 3.6 10*3/uL (ref 1.4–7.7)
Platelets: 474 10*3/uL — ABNORMAL HIGH (ref 150.0–400.0)
RBC: 3.69 Mil/uL — AB (ref 3.87–5.11)
RDW: 16.2 % — ABNORMAL HIGH (ref 11.5–15.5)
WBC: 6 10*3/uL (ref 4.0–10.5)

## 2014-07-31 LAB — LIPID PANEL
CHOLESTEROL: 154 mg/dL (ref 0–200)
HDL: 50 mg/dL (ref >39.00)
LDL Cholesterol: 83 mg/dL (ref 0–99)
NonHDL: 104
Total CHOL/HDL Ratio: 3
Triglycerides: 103 mg/dL (ref 0.0–149.0)
VLDL: 20.6 mg/dL (ref 0.0–40.0)

## 2014-07-31 LAB — HEPATIC FUNCTION PANEL
ALK PHOS: 66 U/L (ref 39–117)
ALT: 10 U/L (ref 0–35)
AST: 20 U/L (ref 0–37)
Albumin: 3.6 g/dL (ref 3.5–5.2)
BILIRUBIN TOTAL: 0.4 mg/dL (ref 0.2–1.2)
Bilirubin, Direct: 0 mg/dL (ref 0.0–0.3)
Total Protein: 7.2 g/dL (ref 6.0–8.3)

## 2014-07-31 NOTE — Patient Instructions (Addendum)
Your physician has requested that you have an ankle brachial index (ABI). During this test an ultrasound and blood pressure cuff are used to evaluate the arteries that supply the arms and legs with blood. Allow thirty minutes for this exam. There are no restrictions or special instructions.  Your physician recommends that you have a FASTING lipid profile:LFT's ,Lipid, CMET, CBC  Your physician wants you to follow-up in: 4 weeks with Dr. Lisette Grinder will receive a reminder letter in the mail two months in advance. If you don't receive a letter, please call our office to schedule the follow-up appointment.

## 2014-07-31 NOTE — Assessment & Plan Note (Signed)
Patient is having rest pain but no exertional pain. Spent 2 years since she's had ABIs. We'll repeat.

## 2014-07-31 NOTE — Assessment & Plan Note (Signed)
Followup fasting lipid panel and LFTs.

## 2014-07-31 NOTE — Assessment & Plan Note (Signed)
Stable without chest pain. Check lab work and followup with Dr. Clifton James.

## 2014-07-31 NOTE — Assessment & Plan Note (Signed)
Well controlled 

## 2014-07-31 NOTE — Progress Notes (Signed)
HPI: This is a 73 year old female patient of Dr.McAlhany who has history of CAD status post inferior STEMI in May 2011 treated with 3 drug-eluting stents to the proximal, mid, and distal RCA, hypertension, HLD, PhD, and COPD.  Overall the patient has done quite well. She denies any chest pain, palpitations, dyspnea, dyspnea on exertion, dizziness, or presyncope. She is very active and does a lot of walking. She is complaining of leg cramps and anterior lower leg and feet. These occur very sporadically. She could go 2 weeks without and then awaken every night with them. She denies any exertional leg cramps. She has not had ABIs since May 2013 which was 0.6 on the right 0.77 on the left. Conservative management was pursued she has moderate right distal SFA stenosis. She saw Dr. Hart Rochester back in 2011.  Allergies  Allergen Reactions  . Iodinated Diagnostic Agents Anaphylaxis  . Shellfish-Derived Products Anaphylaxis  . Hydrocodone Other (See Comments)    unknown     Current Outpatient Prescriptions  Medication Sig Dispense Refill  . aspirin 81 MG tablet Take 81 mg by mouth daily.        Marland Kitchen atorvastatin (LIPITOR) 20 MG tablet Take 20 mg by mouth daily.      . clopidogrel (PLAVIX) 75 MG tablet Take 75 mg by mouth daily.      . hydrALAZINE (APRESOLINE) 25 MG tablet Take 25 mg by mouth daily.      Marland Kitchen lisinopril-hydrochlorothiazide (PRINZIDE,ZESTORETIC) 20-25 MG per tablet Take 1 tablet by mouth daily.      . methimazole (TAPAZOLE) 5 MG tablet Take 5 mg by mouth daily.      . metoprolol succinate (TOPROL-XL) 50 MG 24 hr tablet Take 50 mg by mouth daily. Take with or immediately following a meal.      . nitroGLYCERIN (NITROSTAT) 0.4 MG SL tablet Place 0.4 mg under the tongue every 5 (five) minutes as needed for chest pain.      Marland Kitchen penicillin v potassium (VEETID) 500 MG tablet Take 1 tablet (500 mg total) by mouth 3 (three) times daily.  21 tablet  0  . PROAIR HFA 108 (90 BASE) MCG/ACT inhaler  Inhale 1 puff into the lungs every 6 (six) hours as needed for wheezing or shortness of breath.       Laurann Montana (KERYDIN) 5 % SOLN Apply 1 drop to each affected toenail once daily for 12 months  10 mL  11   No current facility-administered medications for this visit.    Past Medical History  Diagnosis Date  . STEMI (ST elevation myocardial infarction) May 2011    Promus DES in the proximal, mid and distal RCA in May 2011  . HTN (hypertension)   . Hyperlipidemia   . PVD (peripheral vascular disease)     Lower extremity Dopplers  May 2revealed an ABI of 0.83 in the right posterior   . COPD (chronic obstructive pulmonary disease)   . Hyperthyroidism     treated wtih radioactive iodine  . OA (osteoarthritis of spine)   . Stroke     Past Surgical History  Procedure Laterality Date  . Total hip arthroplasty  2004    left  . Abdominal hysterectomy      Had ovarian resection and required lysis of adhesions  . Cardiac catheterization  04/07/2010    EF 60-70%  . Coronary stents      Family History  Problem Relation Age of Onset  . Heart attack Father   .  Peripheral vascular disease Mother     History   Social History  . Marital Status: Married    Spouse Name: N/A    Number of Children: 3  . Years of Education: N/A   Occupational History  . Retired    Social History Main Topics  . Smoking status: Former Smoker -- 1.00 packs/day for 45 years    Types: Cigarettes    Quit date: 04/07/2010  . Smokeless tobacco: Not on file  . Alcohol Use: No  . Drug Use: No  . Sexual Activity: Not on file   Other Topics Concern  . Not on file   Social History Narrative  . No narrative on file    ROS: See history of present illness otherwise negative BP 109/68  Pulse 76  Ht 5' 1.5" (1.562 m)  Wt 129 lb 12.8 oz (58.877 kg)  BMI 24.13 kg/m2  PHYSICAL EXAM: Well-nournished, in no acute distress. Neck: No JVD, HJR, Bruit, or thyroid enlargement  Lungs: No tachypnea, clear  without wheezing, rales, or rhonchi  Cardiovascular: RRR, PMI not displaced, heart sounds normal, no murmurs, gallops, bruit, thrill, or heave.  Abdomen: BS normal. Soft without organomegaly, masses, lesions or tenderness.  Extremities: without cyanosis, clubbing or edema. Good distal pulses bilateral  SKin: Warm, no lesions or rashes   Musculoskeletal: No deformities  Neuro: no focal signs   Wt Readings from Last 3 Encounters:  07/31/14 129 lb 12.8 oz (58.877 kg)  07/25/14 136 lb (61.689 kg)  05/23/14 122 lb (55.339 kg)     EKG: Normal sinus rhythm at 77 beats per min and, in no acute change

## 2014-08-03 ENCOUNTER — Ambulatory Visit (HOSPITAL_COMMUNITY): Payer: Medicare Other | Attending: Cardiovascular Disease | Admitting: Cardiology

## 2014-08-03 DIAGNOSIS — E785 Hyperlipidemia, unspecified: Secondary | ICD-10-CM | POA: Insufficient documentation

## 2014-08-03 DIAGNOSIS — I10 Essential (primary) hypertension: Secondary | ICD-10-CM | POA: Insufficient documentation

## 2014-08-03 DIAGNOSIS — R937 Abnormal findings on diagnostic imaging of other parts of musculoskeletal system: Secondary | ICD-10-CM | POA: Insufficient documentation

## 2014-08-03 DIAGNOSIS — Z8673 Personal history of transient ischemic attack (TIA), and cerebral infarction without residual deficits: Secondary | ICD-10-CM | POA: Diagnosis not present

## 2014-08-03 DIAGNOSIS — Z87891 Personal history of nicotine dependence: Secondary | ICD-10-CM | POA: Insufficient documentation

## 2014-08-03 DIAGNOSIS — I739 Peripheral vascular disease, unspecified: Secondary | ICD-10-CM | POA: Diagnosis not present

## 2014-08-03 DIAGNOSIS — J4489 Other specified chronic obstructive pulmonary disease: Secondary | ICD-10-CM | POA: Insufficient documentation

## 2014-08-03 DIAGNOSIS — I2510 Atherosclerotic heart disease of native coronary artery without angina pectoris: Secondary | ICD-10-CM | POA: Diagnosis not present

## 2014-08-03 DIAGNOSIS — J449 Chronic obstructive pulmonary disease, unspecified: Secondary | ICD-10-CM | POA: Diagnosis not present

## 2014-08-03 DIAGNOSIS — I779 Disorder of arteries and arterioles, unspecified: Secondary | ICD-10-CM | POA: Diagnosis not present

## 2014-08-03 NOTE — Progress Notes (Signed)
ABI performed  

## 2014-08-07 ENCOUNTER — Telehealth: Payer: Self-pay | Admitting: Cardiovascular Disease

## 2014-08-07 NOTE — Telephone Encounter (Signed)
error 

## 2014-08-08 ENCOUNTER — Telehealth: Payer: Self-pay | Admitting: Cardiovascular Disease

## 2014-08-08 NOTE — Telephone Encounter (Signed)
Pt notified of recommendations from Dr. Clifton James

## 2014-08-08 NOTE — Telephone Encounter (Signed)
Follow up:    Per pt calling back for her test results.   Please give her a call back on cell.

## 2014-08-08 NOTE — Telephone Encounter (Signed)
ABI's reviewed with Dr. Clifton James. Per Dr. Clifton James ABI's are overall unchanged since 2013 and pt does not need anything done unless she is having severe pain in her legs. I spoke with pt and reviewed results with her.  She reports stinging in her big toes, warm feeling from knees down and cramps in shin area at times. These symptoms always occur at night and do not occur daily.  She reports her legs become very tired ("leg's don't want to carry me) when going to big box stores like [employer] and [employer].  Often times she has to rest. She does not have pain but reports legs become very tired. She is able to walk same distance outside on a track and legs do not get tired or painful.

## 2014-08-08 NOTE — Telephone Encounter (Signed)
It does not sound like there is a big change. If she has rest pain or any skin changes such as ulceration, I would then refer her to our PV clinic. I don't think this is indicated at this time based on her current symptoms. Thayer Ohm

## 2014-08-27 ENCOUNTER — Other Ambulatory Visit: Payer: Self-pay | Admitting: Cardiovascular Disease

## 2014-08-28 NOTE — Telephone Encounter (Signed)
This should be filled by primary care 

## 2014-08-28 NOTE — Telephone Encounter (Signed)
Is this ok to refill? Please advise. Thanks, MI 

## 2014-08-29 ENCOUNTER — Ambulatory Visit (INDEPENDENT_AMBULATORY_CARE_PROVIDER_SITE_OTHER): Payer: Medicare Other

## 2014-08-29 VITALS — BP 108/62 | HR 66 | Resp 12

## 2014-08-29 DIAGNOSIS — I739 Peripheral vascular disease, unspecified: Secondary | ICD-10-CM

## 2014-08-29 DIAGNOSIS — M79676 Pain in unspecified toe(s): Secondary | ICD-10-CM

## 2014-08-29 DIAGNOSIS — B351 Tinea unguium: Secondary | ICD-10-CM

## 2014-08-29 DIAGNOSIS — Q828 Other specified congenital malformations of skin: Secondary | ICD-10-CM

## 2014-08-29 DIAGNOSIS — M79609 Pain in unspecified limb: Secondary | ICD-10-CM

## 2014-08-29 NOTE — Progress Notes (Signed)
   Subjective:    Patient ID: Kristine [name], female    DOB: 26-Mar-1941, 73 y.o.   MRN: [id number]  HPI patient presents this time for foot and nail care has thick brittle nails and multiple keratotic lesions.    Review of Systems no new findings or systemic changes has recently had Doppler study indicating no changes in her lower legs.     Objective:   Physical Exam Lower extremity objective vascular status PT pulse nonpalpable DP one over 4 bilateral capillary refill time 3 seconds epicritic sensations intact but diminished distally on Semmes Weinstein to forefoot digits there is normal plantar response DTRs not elicited dermatologically skin color pigment normal hair growth absent nails thick brittle crumbly friable discolored 1 through 5 bilateral. Painful and debridement on palpation also multiple keratoses HD 5 left fifth metatarsal area bilateral and subsecond MTP area left. Multiple to the keratotic lesions or debridement this time. No open wounds ulcerations no secondary infection is noted      Assessment & Plan:  Assessment history PDD and complications vascular compromise painful mycotic nails 1 through 5 bilateral debridement at this time also multiple keratoses debrided bilateral feet return for future palliative nail care in the presence of vascular compromise vascular disease every 3 months as recommended  Alvan Dame DPM

## 2014-08-29 NOTE — Patient Instructions (Signed)

## 2014-10-13 ENCOUNTER — Ambulatory Visit: Payer: Medicare Other | Admitting: Cardiovascular Disease

## 2014-11-28 ENCOUNTER — Ambulatory Visit (INDEPENDENT_AMBULATORY_CARE_PROVIDER_SITE_OTHER): Payer: Medicare Other

## 2014-11-28 DIAGNOSIS — B351 Tinea unguium: Secondary | ICD-10-CM

## 2014-11-28 DIAGNOSIS — I739 Peripheral vascular disease, unspecified: Secondary | ICD-10-CM

## 2014-11-28 DIAGNOSIS — Q828 Other specified congenital malformations of skin: Secondary | ICD-10-CM

## 2014-11-28 DIAGNOSIS — M79673 Pain in unspecified foot: Secondary | ICD-10-CM

## 2014-11-28 NOTE — Progress Notes (Signed)
   Subjective:    Patient ID: Kristine [name], female    DOB: 1941/01/01, 73 y.o.   MRN: [id number]  HPI  Pt presents for nail debridement  Review of Systems no new findings or systemic changes noted     Objective:   Physical Exam Vascular status is diminished PT pulses nonpalpable DP one over 4 bilateral Refill time 3 seconds all digits epicritic sensation intact but diminished distally on Semmes Weinstein to the forefoot and digits thick brittle Crumley dystrophic frontal dystrophic nails 1 through 5 bilateral debrided there is keratoses HD 5 left fifth metatarsal area and subsecond MTP area left is also sub-fifth MTP area right keratotic lesion which is debrided no open wounds no ulcers no secondary infections.       Assessment & Plan:  Assessment diabetes with history peripheral neuropathy and complications and PVD nails debrided 1 through 5 bilateral this time keratoses sub-second left sub-fifth right are debrided return in 3 months for continued palliative care in the future as needed  Alvan Dameichard Naveah Brave DPM

## 2014-12-04 ENCOUNTER — Other Ambulatory Visit: Payer: Self-pay | Admitting: Cardiovascular Disease

## 2015-01-01 ENCOUNTER — Ambulatory Visit: Payer: Medicare Other | Admitting: Cardiovascular Disease

## 2015-01-04 ENCOUNTER — Encounter: Payer: Self-pay | Admitting: Cardiovascular Disease

## 2015-01-04 ENCOUNTER — Ambulatory Visit (INDEPENDENT_AMBULATORY_CARE_PROVIDER_SITE_OTHER): Payer: Medicare Other | Admitting: Cardiovascular Disease

## 2015-01-04 VITALS — BP 142/76 | HR 76 | Ht 61.5 in | Wt 127.0 lb

## 2015-01-04 DIAGNOSIS — I2510 Atherosclerotic heart disease of native coronary artery without angina pectoris: Secondary | ICD-10-CM

## 2015-01-04 DIAGNOSIS — I739 Peripheral vascular disease, unspecified: Secondary | ICD-10-CM | POA: Diagnosis not present

## 2015-01-04 MED ORDER — NITROGLYCERIN 0.4 MG SL SUBL
0.4000 mg | SUBLINGUAL_TABLET | SUBLINGUAL | Status: DC | PRN
Start: 2015-01-04 — End: 2016-08-14

## 2015-01-04 NOTE — Patient Instructions (Signed)
Your physician wants you to follow-up in: 6 months. You will receive a reminder letter in the mail two months in advance. If you don't receive a letter, please call our office to schedule the follow-up appointment.  Your physician has requested that you have an ankle brachial index (ABI). During this test an ultrasound and blood pressure cuff are used to evaluate the arteries that supply the arms and legs with blood. Allow thirty minutes for this exam. There are no restrictions or special instructions.To be done in 6 months on day of appt with Dr. McAlhany      

## 2015-01-04 NOTE — Progress Notes (Signed)
History of Present Illness: 74 yo female with history of CAD s/p inferior STEMI May 2011 treated with 3 DES in the proximal, mid and distal RCA, HTN, HLD, PAD, COPD here today for cardiac follow up. She has been followed in the past by Dr. Deborah Chalkennant. She is known to have anomalous takeoff of all coronaries. She had abnormal LE arterial dopplers in May 2011 and was seen by Dr. Betti CruzJD Lawson in [facility] in August 2011. Lower extremity arterial dopplers August 2015 which showed ABI of 0.63 on the right and 0.82on the left. Conservative management has been pursued with her LE arterial disease. Stress myoview 10/05/13 with no ischemia.   She is here today for cardiac and PV follow up. She denies any chest pain or SOB. She has no pain in her legs. No rest pain or ulcerations. She feels well.   Primary Care Physician: Dr. Amalia HaileyAlvin V Blount   Last Lipid Profile:Lipid Panel     Component Value Date/Time   CHOL 154 07/31/2014 1153   TRIG 103.0 07/31/2014 1153   HDL 50.00 07/31/2014 1153   CHOLHDL 3 07/31/2014 1153   VLDL 20.6 07/31/2014 1153   LDLCALC 83 07/31/2014 1153     Past Medical History  Diagnosis Date  . STEMI (ST elevation myocardial infarction) May 2011    Promus DES in the proximal, mid and distal RCA in May 2011  . HTN (hypertension)   . Hyperlipidemia   . PVD (peripheral vascular disease)     Lower extremity Dopplers  May 2revealed an ABI of 0.83 in the right posterior   . COPD (chronic obstructive pulmonary disease)   . Hyperthyroidism     treated wtih radioactive iodine  . OA (osteoarthritis of spine)   . Stroke     Past Surgical History  Procedure Laterality Date  . Total hip arthroplasty  2004    left  . Abdominal hysterectomy      Had ovarian resection and required lysis of adhesions  . Cardiac catheterization  04/07/2010    EF 60-70%  . Coronary stents      Current Outpatient Prescriptions  Medication Sig Dispense Refill  . aspirin 81 MG tablet Take 81 mg by mouth daily.       Marland Kitchen. atorvastatin (LIPITOR) 20 MG tablet Take 20 mg by mouth daily.    . clopidogrel (PLAVIX) 75 MG tablet TAKE 1 TABLET BY MOUTH EVERY DAY 30 tablet 2  . hydrALAZINE (APRESOLINE) 25 MG tablet Take 25 mg by mouth daily.    Marland Kitchen. lisinopril-hydrochlorothiazide (PRINZIDE,ZESTORETIC) 20-25 MG per tablet Take 1 tablet by mouth daily.    . methimazole (TAPAZOLE) 5 MG tablet Take 5 mg by mouth daily.    . metoprolol succinate (TOPROL-XL) 50 MG 24 hr tablet Take 50 mg by mouth daily. Take with or immediately following a meal.    . nitroGLYCERIN (NITROSTAT) 0.4 MG SL tablet Place 0.4 mg under the tongue every 5 (five) minutes as needed for chest pain.    Marland Kitchen. PROAIR HFA 108 (90 BASE) MCG/ACT inhaler Inhale 1 puff into the lungs every 6 (six) hours as needed for wheezing or shortness of breath.     Laurann Montana. Tavaborole (KERYDIN) 5 % SOLN Apply 1 drop to each affected toenail once daily for 12 months 10 mL 11   No current facility-administered medications for this visit.    Allergies  Allergen Reactions  . Iodinated Diagnostic Agents Anaphylaxis  . Shellfish-Derived Products Anaphylaxis  . Hydrocodone Other (See Comments)  unknown    History   Social History  . Marital Status: Married    Spouse Name: N/A    Number of Children: 3  . Years of Education: N/A   Occupational History  . Retired    Social History Main Topics  . Smoking status: Former Smoker -- 1.00 packs/day for 45 years    Types: Cigarettes    Quit date: 04/07/2010  . Smokeless tobacco: Not on file  . Alcohol Use: No  . Drug Use: No  . Sexual Activity: Not on file   Other Topics Concern  . Not on file   Social History Narrative    Family History  Problem Relation Age of Onset  . Heart attack Father   . Peripheral vascular disease Mother     Review of Systems:  As stated in the HPI and otherwise negative.   BP 142/76 mmHg  Pulse 76  Ht 5' 1.5" (1.562 m)  Wt 127 lb (57.607 kg)  BMI 23.61 kg/m2  SpO2 99%  Physical  Examination: General: Well developed, well nourished, NAD HEENT: OP clear, mucus membranes moist SKIN: warm, dry. No rashes. Neuro: No focal deficits Musculoskeletal: Muscle strength 5/5 all ext Psychiatric: Mood and affect normal Neck: No JVD, no carotid bruits, no thyromegaly, no lymphadenopathy. Lungs:Clear bilaterally, no wheezes, rhonci, crackles Cardiovascular: Regular rate and rhythm. No murmurs, gallops or rubs. Abdomen:Soft. Bowel sounds present. Non-tender.  Extremities: No lower extremity edema. Pulses are trace in the bilateral DP/PT.  ABI 8/15: Right 0.63. Left 0.82.  Stress myoview 10/05/13:  Stress Procedure: The patient received IV Lexiscan 0.4 mg over 15-seconds. Technetium 40m Sestamibi injected at 30-seconds. Quantitative spect images were obtained after a 45 minute delay. Patient complained of a weak feeling, chest tightness and stomach hurting. 75 mg aminophylline was given by Bonnita Levan, RN six minutes into recovery. Symptoms subsided after a few minutes.  Stress ECG: No significant change from baseline ECG  QPS Raw Data Images: Normal; no motion artifact; normal heart/lung ratio. Stress Images: Normal homogeneous uptake in all areas of the myocardium. Rest Images: Normal homogeneous uptake in all areas of the myocardium. Subtraction (SDS): No evidence of ischemia. Transient Ischemic Dilatation (Normal <1.22): 0.97 Lung/Heart Ratio (Normal <0.45): 0.26  Quantitative Gated Spect Images QGS EDV: 55 ml QGS ESV: 16 ml  Impression Exercise Capacity: Lexiscan with no exercise. BP Response: Normal blood pressure response. Clinical Symptoms: Mild chest pain/dyspnea. ECG Impression: No significant ST segment change suggestive of ischemia. Comparison with Prior Nuclear Study: No previous nuclear study performed  Overall Impression: Normal stress nuclear study.  LV Ejection Fraction: 71%. LV Wall Motion: NL LV Function; NL Wall  Motion  Assessment and Plan:   1. PAD: Stable. ABI stable 8/15. She has no claudication, rest pain or ulcerations. She no longer smokes. Will encourage ambulation daily. Conservative management for now unless her clinical picture changes. Repeat ABI per schedule this summer  2. CAD: No recent chest pains. Stress myoview 2014 with no ischemia.  Continue ASA/Plavix/statin/beta blocker.

## 2015-02-06 ENCOUNTER — Ambulatory Visit (INDEPENDENT_AMBULATORY_CARE_PROVIDER_SITE_OTHER): Payer: Medicare Other

## 2015-02-06 DIAGNOSIS — M79673 Pain in unspecified foot: Secondary | ICD-10-CM | POA: Diagnosis not present

## 2015-02-06 DIAGNOSIS — Q828 Other specified congenital malformations of skin: Secondary | ICD-10-CM | POA: Diagnosis not present

## 2015-02-06 DIAGNOSIS — B351 Tinea unguium: Secondary | ICD-10-CM | POA: Diagnosis not present

## 2015-02-06 DIAGNOSIS — I739 Peripheral vascular disease, unspecified: Secondary | ICD-10-CM

## 2015-02-06 NOTE — Progress Notes (Signed)
   Subjective:    Patient ID: Kristine [name], female    DOB: Dec 15, 1940, 74 y.o.   MRN: [id number]  HPI  Pt is here for nail debridement  Review of Systems no new findings or systemic changes     Objective:   Physical Exam Vascular status is intact but diminished DP plus one over 4 PT thready nonpalpable bilateral Refill time 4 seconds all digits epicritic sensations grossly diminished on Semmes Weinstein to the forefoot digits and arch. Thick brittle crumbly dystrophic friable criptotic nails 1 through 5 bilateral. Is also keratoses sub-fifth MTP area sub-first and subsecond MTP area of the left foot AP keratotic lesions are identified on the left foot no syndesmotic lesions on the right foot. No other open wounds no ulcers no secondary infections digital contractures noted as well as thick brittle dystrophic friable mycotic nails 1 through 5 bilateral       Assessment & Plan:  Assessment this time history of diabetes with complication also history peripheral neuropathy and PVD with decreased sense vascular supply at this time painful mycotic nails debrided 1 through 5 bilateral also recently keratotic lesion multiple keratotic lesions left foot sub-12 and 5 left foot return in 3 months for continued palliative care and future as needed  Alvan Dameichard Jadore Mcguffin DPM

## 2015-02-08 ENCOUNTER — Other Ambulatory Visit: Payer: Self-pay

## 2015-02-08 MED ORDER — METOPROLOL SUCCINATE ER 50 MG PO TB24
50.0000 mg | ORAL_TABLET | Freq: Every day | ORAL | Status: DC
Start: 2015-02-08 — End: 2015-08-15

## 2015-02-08 MED ORDER — LISINOPRIL-HYDROCHLOROTHIAZIDE 20-25 MG PO TABS: 1.0000 | ORAL_TABLET | Freq: Every day | ORAL | Status: DC

## 2015-02-08 MED ORDER — ATORVASTATIN CALCIUM 20 MG PO TABS: 20.0000 mg | ORAL_TABLET | Freq: Every day | ORAL | Status: DC

## 2015-02-14 ENCOUNTER — Other Ambulatory Visit: Payer: Self-pay | Admitting: Cardiovascular Disease

## 2015-03-06 ENCOUNTER — Ambulatory Visit: Payer: Medicare Other

## 2015-03-08 DIAGNOSIS — I10 Essential (primary) hypertension: Secondary | ICD-10-CM | POA: Diagnosis not present

## 2015-03-08 DIAGNOSIS — E785 Hyperlipidemia, unspecified: Secondary | ICD-10-CM | POA: Diagnosis not present

## 2015-03-08 DIAGNOSIS — K921 Melena: Secondary | ICD-10-CM | POA: Diagnosis not present

## 2015-03-09 ENCOUNTER — Other Ambulatory Visit (HOSPITAL_COMMUNITY): Payer: Self-pay

## 2015-03-09 ENCOUNTER — Inpatient Hospital Stay (HOSPITAL_COMMUNITY)
Admission: EM | Admit: 2015-03-09 | Discharge: 2015-03-12 | DRG: 378 | Disposition: A | Discharge status: Home / Self Care | Payer: Medicare Other | Attending: Internal Medicine | Admitting: Internal Medicine

## 2015-03-09 ENCOUNTER — Encounter (HOSPITAL_COMMUNITY): Payer: Self-pay | Admitting: *Deleted

## 2015-03-09 DIAGNOSIS — Z955 Presence of coronary angioplasty implant and graft: Secondary | ICD-10-CM

## 2015-03-09 DIAGNOSIS — Z8673 Personal history of transient ischemic attack (TIA), and cerebral infarction without residual deficits: Secondary | ICD-10-CM

## 2015-03-09 DIAGNOSIS — R509 Fever, unspecified: Secondary | ICD-10-CM | POA: Diagnosis not present

## 2015-03-09 DIAGNOSIS — Z79899 Other long term (current) drug therapy: Secondary | ICD-10-CM

## 2015-03-09 DIAGNOSIS — D62 Acute posthemorrhagic anemia: Secondary | ICD-10-CM | POA: Diagnosis present

## 2015-03-09 DIAGNOSIS — Z7902 Long term (current) use of antithrombotics/antiplatelets: Secondary | ICD-10-CM

## 2015-03-09 DIAGNOSIS — Z7982 Long term (current) use of aspirin: Secondary | ICD-10-CM

## 2015-03-09 DIAGNOSIS — I252 Old myocardial infarction: Secondary | ICD-10-CM | POA: Diagnosis not present

## 2015-03-09 DIAGNOSIS — Z96642 Presence of left artificial hip joint: Secondary | ICD-10-CM | POA: Diagnosis present

## 2015-03-09 DIAGNOSIS — Z23 Encounter for immunization: Secondary | ICD-10-CM

## 2015-03-09 DIAGNOSIS — E785 Hyperlipidemia, unspecified: Secondary | ICD-10-CM | POA: Diagnosis present

## 2015-03-09 DIAGNOSIS — K219 Gastro-esophageal reflux disease without esophagitis: Secondary | ICD-10-CM | POA: Diagnosis present

## 2015-03-09 DIAGNOSIS — Z87891 Personal history of nicotine dependence: Secondary | ICD-10-CM

## 2015-03-09 DIAGNOSIS — M1990 Unspecified osteoarthritis, unspecified site: Secondary | ICD-10-CM | POA: Diagnosis present

## 2015-03-09 DIAGNOSIS — K922 Gastrointestinal hemorrhage, unspecified: Secondary | ICD-10-CM | POA: Diagnosis not present

## 2015-03-09 DIAGNOSIS — I10 Essential (primary) hypertension: Secondary | ICD-10-CM | POA: Diagnosis present

## 2015-03-09 DIAGNOSIS — J449 Chronic obstructive pulmonary disease, unspecified: Secondary | ICD-10-CM | POA: Diagnosis not present

## 2015-03-09 DIAGNOSIS — E0590 Thyrotoxicosis, unspecified without thyrotoxic crisis or storm: Secondary | ICD-10-CM | POA: Diagnosis present

## 2015-03-09 DIAGNOSIS — I739 Peripheral vascular disease, unspecified: Secondary | ICD-10-CM | POA: Diagnosis present

## 2015-03-09 DIAGNOSIS — K921 Melena: Principal | ICD-10-CM

## 2015-03-09 DIAGNOSIS — I2510 Atherosclerotic heart disease of native coronary artery without angina pectoris: Secondary | ICD-10-CM | POA: Diagnosis present

## 2015-03-09 LAB — URINALYSIS, ROUTINE W REFLEX MICROSCOPIC
BILIRUBIN URINE: NEGATIVE
Glucose, UA: NEGATIVE mg/dL
HGB URINE DIPSTICK: NEGATIVE
Ketones, ur: NEGATIVE mg/dL
Nitrite: NEGATIVE
PH: 5 (ref 5.0–8.0)
Protein, ur: NEGATIVE mg/dL
Specific Gravity, Urine: 1.02 (ref 1.005–1.030)
UROBILINOGEN UA: 0.2 mg/dL (ref 0.0–1.0)

## 2015-03-09 LAB — COMPREHENSIVE METABOLIC PANEL
ALBUMIN: 3.9 g/dL (ref 3.5–5.2)
ALT: 14 U/L (ref 0–35)
AST: 21 U/L (ref 0–37)
Alkaline Phosphatase: 68 U/L (ref 39–117)
Anion gap: 4 — ABNORMAL LOW (ref 5–15)
BUN: 27 mg/dL — ABNORMAL HIGH (ref 6–23)
CALCIUM: 8.9 mg/dL (ref 8.4–10.5)
CO2: 27 mmol/L (ref 19–32)
Chloride: 105 mmol/L (ref 96–112)
Creatinine, Ser: 0.9 mg/dL (ref 0.50–1.10)
GFR calc Af Amer: 73 mL/min — ABNORMAL LOW (ref >90)
GFR calc non Af Amer: 63 mL/min — ABNORMAL LOW (ref >90)
Glucose, Bld: 101 mg/dL — ABNORMAL HIGH (ref 70–99)
Potassium: 3.7 mmol/L (ref 3.5–5.1)
SODIUM: 136 mmol/L (ref 135–145)
TOTAL PROTEIN: 7.1 g/dL (ref 6.0–8.3)
Total Bilirubin: 0.5 mg/dL (ref 0.3–1.2)

## 2015-03-09 LAB — I-STAT TROPONIN, ED: TROPONIN I, POC: 0 ng/mL (ref 0.00–0.08)

## 2015-03-09 LAB — CBC WITH DIFFERENTIAL/PLATELET
BASOS ABS: 0.1 10*3/uL (ref 0.0–0.1)
BASOS PCT: 1 % (ref 0–1)
EOS PCT: 3 % (ref 0–5)
Eosinophils Absolute: 0.2 10*3/uL (ref 0.0–0.7)
HCT: 20.5 % — ABNORMAL LOW (ref 36.0–46.0)
HEMOGLOBIN: 6.4 g/dL — AB (ref 12.0–15.0)
LYMPHS PCT: 42 % (ref 12–46)
Lymphs Abs: 2.2 10*3/uL (ref 0.7–4.0)
MCH: 26 pg (ref 26.0–34.0)
MCHC: 31.2 g/dL (ref 30.0–36.0)
MCV: 83.3 fL (ref 78.0–100.0)
MONOS PCT: 6 % (ref 3–12)
Monocytes Absolute: 0.3 10*3/uL (ref 0.1–1.0)
NEUTROS ABS: 2.6 10*3/uL (ref 1.7–7.7)
Neutrophils Relative %: 49 % (ref 43–77)
Platelets: 539 10*3/uL — ABNORMAL HIGH (ref 150–400)
RBC: 2.46 MIL/uL — ABNORMAL LOW (ref 3.87–5.11)
RDW: 18.2 % — AB (ref 11.5–15.5)
WBC: 5.4 10*3/uL (ref 4.0–10.5)

## 2015-03-09 LAB — URINE MICROSCOPIC-ADD ON

## 2015-03-09 LAB — PREPARE RBC (CROSSMATCH)

## 2015-03-09 LAB — MAGNESIUM: MAGNESIUM: 2.2 mg/dL (ref 1.5–2.5)

## 2015-03-09 LAB — PHOSPHORUS: PHOSPHORUS: 3.8 mg/dL (ref 2.3–4.6)

## 2015-03-09 LAB — POC OCCULT BLOOD, ED: Fecal Occult Bld: POSITIVE — AB

## 2015-03-09 MED ORDER — SODIUM CHLORIDE 0.9 % IV BOLUS (SEPSIS)
1000.0000 mL | Freq: Once | INTRAVENOUS | Status: AC
  Administered 2015-03-09: 1000 mL via INTRAVENOUS

## 2015-03-09 MED ORDER — PANTOPRAZOLE SODIUM 40 MG IV SOLR: 40.0000 mg | Freq: Once | INTRAVENOUS | Status: DC

## 2015-03-09 MED ORDER — SODIUM CHLORIDE 0.9 % IV SOLN
Freq: Once | INTRAVENOUS | Status: AC
  Administered 2015-03-10: 03:00:00 via INTRAVENOUS

## 2015-03-09 NOTE — ED Notes (Signed)
The pt is c/o weakness for 5 days.  No pain anywhere no swelling in her feet and legs.  She sometimes has dizziness but not now

## 2015-03-09 NOTE — ED Notes (Signed)
The pt saw her doctor yesterday and had lab work drawn

## 2015-03-09 NOTE — H&P (Signed)
[facility] History and Physical  Patient: Kristine [name]  MRN: [id number]  DOB: Nov 12, 1941  DOS: the patient was seen and examined on 03/09/2015 PCP: Burtis Junes, MD  Chief Complaint: Fatigue  HPI: Kristine [name] is a 74 y.o. female with Past medical history of coronary artery disease status post PCI 2011, peripheral vascular disease on dual Antiplatelet, COPD, hyperthyroidism. The patient is presenting with complaints of chronic fatigue ongoing since last 2 weeks. She also has noticed a chocolate color bowel movement since last 3 weeks. She denies any abdominal pain. Today she complains of some increased acid reflux. She complains of some dizziness and lightheadedness since last 3 weeks as well. She denies any nausea vomiting or abdominal pain or shortness of breath but 2 days ago she had some chest pain which resolved with nitroglycerin. She denies any leg swelling denies any burning urination or cough. She denies any prior history of colonoscopy  The patient is coming from home. And at her baseline independent for most of her ADL.  Review of Systems: as mentioned in the history of present illness.  A Comprehensive review of the other systems is negative.  Past Medical History  Diagnosis Date  . STEMI (ST elevation myocardial infarction) May 2011    Promus DES in the proximal, mid and distal RCA in May 2011  . HTN (hypertension)   . Hyperlipidemia   . PVD (peripheral vascular disease)     Lower extremity Dopplers  May 2revealed an ABI of 0.83 in the right posterior   . COPD (chronic obstructive pulmonary disease)   . Hyperthyroidism     treated wtih radioactive iodine  . OA (osteoarthritis of spine)   . Stroke    Past Surgical History  Procedure Laterality Date  . Total hip arthroplasty  2004    left  . Abdominal hysterectomy      Had ovarian resection and required lysis of adhesions  . Cardiac catheterization  04/07/2010    EF 60-70%  . Coronary stents      Social History:  reports that she quit smoking about 4 years ago. Her smoking use included Cigarettes. She has a 45 pack-year smoking history. She does not have any smokeless tobacco history on file. She reports that she does not drink alcohol or use illicit drugs.  Allergies  Allergen Reactions  . Iodinated Diagnostic Agents Anaphylaxis  . Shellfish-Derived Products Anaphylaxis  . Hydrocodone Other (See Comments)    unknown    Family History  Problem Relation Age of Onset  . Heart attack Father   . Peripheral vascular disease Mother     Prior to Admission medications   Medication Sig Start Date End Date Taking? Authorizing Provider  aspirin 81 MG tablet Take 81 mg by mouth daily.     Yes Historical Provider, MD  atorvastatin (LIPITOR) 20 MG tablet Take 1 tablet (20 mg total) by mouth daily. 02/08/15  Yes Kathleene Hazel, MD  clopidogrel (PLAVIX) 75 MG tablet TAKE 1 TABLET BY MOUTH EVERY DAY 12/05/14  Yes Kathleene Hazel, MD  hydrALAZINE (APRESOLINE) 25 MG tablet Take 25 mg by mouth every other day.    Yes Historical Provider, MD  lisinopril-hydrochlorothiazide (PRINZIDE,ZESTORETIC) 20-25 MG per tablet Take 1 tablet by mouth daily. 02/08/15  Yes Kathleene Hazel, MD  methimazole (TAPAZOLE) 5 MG tablet Take 5 mg by mouth daily.   Yes Historical Provider, MD  metoprolol succinate (TOPROL-XL) 50 MG 24 hr tablet Take 1 tablet (50 mg total)  by mouth daily. Take with or immediately following a meal. 02/08/15  Yes Kathleene Hazel, MD  nitroGLYCERIN (NITROSTAT) 0.4 MG SL tablet Place 1 tablet (0.4 mg total) under the tongue every 5 (five) minutes as needed for chest pain. 01/04/15  Yes Kathleene Hazel, MD  PROAIR HFA 108 [phone] BASE) MCG/ACT inhaler Inhale 1 puff into the lungs every 6 (six) hours as needed for wheezing or shortness of breath.  10/31/13  Yes Historical Provider, MD  Tavaborole (KERYDIN) 5 % SOLN Apply 1 drop to each affected toenail once daily for 12  months 02/07/14  Yes Alvan Dame, DPM  hydrALAZINE (APRESOLINE) 25 MG tablet TAKE 1 TABLET BY MOUTH EVERY DAY Patient not taking: Reported on 03/09/2015 02/15/15   Kathleene Hazel, MD    Physical Exam: Filed Vitals:   03/09/15 1850 03/09/15 2108 03/09/15 2230 03/09/15 2300  BP: 111/68 99/60 104/52 115/70  Pulse: 82 77 77 75  Temp: 98.4 F (36.9 C) 98.2 F (36.8 C)    TempSrc:  Oral    Resp: SpO2: 100% 100% 100% 100%    General: Alert, Awake and Oriented to Time, Place and Person. Appear in mild distress Eyes: PERRL ENT: Oral Mucosa clear moist Neck: no JVD Cardiovascular: S1 and S2 Present, no Murmur, Peripheral Pulses Present Respiratory: Bilateral Air entry equal and Decreased, Clear to Auscultation, noCrackles, no wheezes Abdomen: Bowel Sound presentoft and no tender Skin: no Rash Extremities: no Pedal edema, no calf tenderness Neurologic: Grossly no focal neuro deficit.  Labs on Admission:  CBC:  Recent Labs Lab 03/09/15 2140  WBC 5.4  NEUTROABS 2.6  HGB 6.4*  HCT 20.5*  MCV 83.3  PLT 539*    CMP     Component Value Date/Time   NA 136 03/09/2015 2140   K 3.7 03/09/2015 2140   CL 105 03/09/2015 2140   CO2 27 03/09/2015 2140   GLUCOSE 101* 03/09/2015 2140   BUN 27* 03/09/2015 2140   CREATININE 0.90 03/09/2015 2140   CALCIUM 8.9 03/09/2015 2140   PROT 7.1 03/09/2015 2140   ALBUMIN 3.9 03/09/2015 2140   AST 21 03/09/2015 2140   ALT 14 03/09/2015 2140   ALKPHOS 68 03/09/2015 2140   BILITOT 0.5 03/09/2015 2140   GFRNONAA 63* 03/09/2015 2140   GFRAA 73* 03/09/2015 2140   Assessment/Plan Principal Problem:   Melena Active Problems:   HTN (hypertension)   Hyperlipidemia   PVD (peripheral vascular disease)   COPD (chronic obstructive pulmonary disease)   Hyperthyroidism   CAD (coronary artery disease)   GI bleed   1. Melena  the patient is presenting with complaints. Ventricular bowel movement with loose 2-3 episodes in a  day. She doesn't have any abdominal tenderness. She is mildly hypotensive. Her H&H is significantly low. With this the patient will be receiving IV blood transfusion. I would also give her IV Protonix 80 mg every 12 hours. She will be followed on telemetry. GI will be considered by ED. He'll be following up with the patient. Nothing by mouth except medications.  2.Essential hypertension. Holding antihypertensive medication in view of low blood pressure.  3. HypERthyroidism. Continue home medications.  Advance goals of care discussion: Full code    Consults: gastroenterology  DVT Prophylaxis: Mechanical compression   Family Communication: husband  was present at bedside, opportunity was given to ask question and all questions were answered satisfactorily at the time of interview. Disposition: Admitted to inpatient in telemetry unit.  Author:  Lynden OxfordPranav Euretha Najarro, MD [facility] Pager: [phone] 03/09/2015, 11:43 PM    If 7PM-7AM, please contact night-coverage [email or web address] Password [email or web address]

## 2015-03-09 NOTE — ED Provider Notes (Signed)
CSN: [id number]     Arrival date & time 03/09/15  1739 History   First MD Initiated Contact with Patient 03/09/15 2109     Chief Complaint  Patient presents with  . Weakness     (Consider location/radiation/quality/duration/timing/severity/associated sxs/prior Treatment) Patient is a 74 y.o. female presenting with hematochezia. The history is provided by the patient.  Rectal Bleeding Quality:  Black and tarry ("Chocolate") Amount:  Moderate Duration:  2 weeks Timing:  Constant Progression:  Unchanged Chronicity:  New Context: hemorrhoids   Context: not constipation   Relieved by:  None tried Worsened by:  Nothing tried Ineffective treatments:  None tried Associated symptoms: no abdominal pain, no epistaxis, no fever and no vomiting   Risk factors: anticoagulant use (Aspirin and Plavix)   Risk factors: no liver disease and no NSAID use     Past Medical History  Diagnosis Date  . STEMI (ST elevation myocardial infarction) May 2011    Promus DES in the proximal, mid and distal RCA in May 2011  . HTN (hypertension)   . Hyperlipidemia   . PVD (peripheral vascular disease)     Lower extremity Dopplers  May 2revealed an ABI of 0.83 in the right posterior   . COPD (chronic obstructive pulmonary disease)   . Hyperthyroidism     treated wtih radioactive iodine  . OA (osteoarthritis of spine)   . Stroke    Past Surgical History  Procedure Laterality Date  . Total hip arthroplasty  2004    left  . Abdominal hysterectomy      Had ovarian resection and required lysis of adhesions  . Cardiac catheterization  04/07/2010    EF 60-70%  . Coronary stents     Family History  Problem Relation Age of Onset  . Heart attack Father   . Peripheral vascular disease Mother    History  Substance Use Topics  . Smoking status: Former Smoker -- 1.00 packs/day for 45 years    Types: Cigarettes    Quit date: 04/07/2010  . Smokeless tobacco: Not on file  . Alcohol Use: No   OB History    No data available     Review of Systems  Constitutional: Negative for fever.  HENT: Negative for nosebleeds.   Respiratory: Positive for chest tightness and shortness of breath.   Cardiovascular: Negative for chest pain.  Gastrointestinal: Positive for blood in stool and hematochezia. Negative for nausea, vomiting, abdominal pain, diarrhea and constipation.  All other systems reviewed and are negative.     Allergies  Iodinated diagnostic agents; Shellfish-derived products; and Hydrocodone  Home Medications   Prior to Admission medications   Medication Sig Start Date End Date Taking? Authorizing Provider  aspirin 81 MG tablet Take 81 mg by mouth daily.     Yes Historical Provider, MD  atorvastatin (LIPITOR) 20 MG tablet Take 1 tablet (20 mg total) by mouth daily. 02/08/15  Yes Kathleene Hazel, MD  clopidogrel (PLAVIX) 75 MG tablet TAKE 1 TABLET BY MOUTH EVERY DAY 12/05/14  Yes Kathleene Hazel, MD  hydrALAZINE (APRESOLINE) 25 MG tablet Take 25 mg by mouth every other day.    Yes Historical Provider, MD  lisinopril-hydrochlorothiazide (PRINZIDE,ZESTORETIC) 20-25 MG per tablet Take 1 tablet by mouth daily. 02/08/15  Yes Kathleene Hazel, MD  methimazole (TAPAZOLE) 5 MG tablet Take 5 mg by mouth daily.   Yes Historical Provider, MD  metoprolol succinate (TOPROL-XL) 50 MG 24 hr tablet Take 1 tablet (50 mg total) by mouth  daily. Take with or immediately following a meal. 02/08/15  Yes Kathleene Hazel, MD  nitroGLYCERIN (NITROSTAT) 0.4 MG SL tablet Place 1 tablet (0.4 mg total) under the tongue every 5 (five) minutes as needed for chest pain. 01/04/15  Yes Kathleene Hazel, MD  PROAIR HFA 108 [phone] BASE) MCG/ACT inhaler Inhale 1 puff into the lungs every 6 (six) hours as needed for wheezing or shortness of breath.  10/31/13  Yes Historical Provider, MD  Tavaborole (KERYDIN) 5 % SOLN Apply 1 drop to each affected toenail once daily for 12 months 02/07/14  Yes Alvan Dame, DPM  hydrALAZINE (APRESOLINE) 25 MG tablet TAKE 1 TABLET BY MOUTH EVERY DAY Patient not taking: Reported on 03/09/2015 02/15/15   Kathleene Hazel, MD   BP 104/52 mmHg  Pulse 77  Temp(Src) 98.2 F (36.8 C) (Oral)  Resp 15  SpO2 100% Physical Exam  Constitutional: She appears well-developed and well-nourished. No distress.  HENT:  Head: Normocephalic and atraumatic.  Mouth/Throat: No oropharyngeal exudate.  Sublingual pallor  Eyes: EOM are normal. Pupils are equal, round, and reactive to light.  Conjunctiva pale  Neck: Normal range of motion. Neck supple.  Cardiovascular: Normal rate, regular rhythm, normal heart sounds and intact distal pulses.  Exam reveals no gallop and no friction rub.   No murmur heard. Pulmonary/Chest: Effort normal and breath sounds normal. No respiratory distress. She has no wheezes. She has no rales.  Abdominal: Soft. She exhibits no distension. There is no tenderness.  Musculoskeletal: Normal range of motion. She exhibits no edema.  Skin: Skin is warm and dry. No rash noted. She is not diaphoretic.  Psychiatric: She has a normal mood and affect. Her behavior is normal. Judgment and thought content normal.  Nursing note and vitals reviewed.   ED Course  Procedures (including critical care time) Labs Review Labs Reviewed  CBC WITH DIFFERENTIAL/PLATELET - Abnormal; Notable for the following:    RBC 2.46 (*)    Hemoglobin 6.4 (*)    HCT 20.5 (*)    RDW 18.2 (*)    Platelets 539 (*)    All other components within normal limits  COMPREHENSIVE METABOLIC PANEL - Abnormal; Notable for the following:    Glucose, Bld 101 (*)    BUN 27 (*)    GFR calc non Af Amer 63 (*)    GFR calc Af Amer 73 (*)    Anion gap 4 (*)    All other components within normal limits  URINALYSIS, ROUTINE W REFLEX MICROSCOPIC - Abnormal; Notable for the following:    APPearance CLOUDY (*)    Leukocytes, UA LARGE (*)    All other components within normal limits  POC  OCCULT BLOOD, ED - Abnormal; Notable for the following:    Fecal Occult Bld POSITIVE (*)    All other components within normal limits  MAGNESIUM  PHOSPHORUS  URINE MICROSCOPIC-ADD ON  I-STAT TROPOININ, ED  TYPE AND SCREEN  PREPARE RBC (CROSSMATCH)    Imaging Review No results found.   EKG Interpretation None      MDM   Final diagnoses:  Gastrointestinal hemorrhage with melena    74 year old female with history of hypertension, coronary disease on aspirin and Plavix, hyperthyroidism presents with generalized fatigue and weakness. Progressive weakness in the lower extremities and also some intermittent shortness of breath. Denies chest pain. She also endorses "chocolate" stools for the last 2 weeks. Denies abdominal pain.  Blood pressure is soft but vital signs stable. Hemoccult positive  with dark stool but no gross blood. Concern for elective light abnormality or more likely upper GI bleed. Labs sent and type and cross sent. Fluids given initially. Hemoglobin returned at 6.4 with hematocrit of 21. This is likely a slow GI bleed based on presentation and history. We will transfuse 2 units and discuss with gastroenterology and the hospitalist for admission. Protonix also given.  Dorna LeitzAlex Filomeno Cromley, MD 03/09/15 [id number]  Blane OharaJoshua Zavitz, MD 03/09/15 [phone]

## 2015-03-10 ENCOUNTER — Encounter (HOSPITAL_COMMUNITY)
Admission: EM | Disposition: A | Discharge status: Home / Self Care | Payer: Self-pay | Source: Home / Self Care | Attending: Internal Medicine

## 2015-03-10 ENCOUNTER — Encounter (HOSPITAL_COMMUNITY): Payer: Self-pay | Admitting: General Practice

## 2015-03-10 DIAGNOSIS — K921 Melena: Secondary | ICD-10-CM | POA: Diagnosis present

## 2015-03-10 HISTORY — PX: ESOPHAGOGASTRODUODENOSCOPY: SHX5428

## 2015-03-10 LAB — CBC
HCT: 29.1 % — ABNORMAL LOW (ref 36.0–46.0)
Hemoglobin: 9.7 g/dL — ABNORMAL LOW (ref 12.0–15.0)
MCH: 27.9 pg (ref 26.0–34.0)
MCHC: 33.3 g/dL (ref 30.0–36.0)
MCV: 83.6 fL (ref 78.0–100.0)
PLATELETS: 460 10*3/uL — AB (ref 150–400)
RBC: 3.48 MIL/uL — ABNORMAL LOW (ref 3.87–5.11)
RDW: 16.2 % — ABNORMAL HIGH (ref 11.5–15.5)
WBC: 5.8 10*3/uL (ref 4.0–10.5)

## 2015-03-10 LAB — ABO/RH: ABO/RH(D): AB POS

## 2015-03-10 SURGERY — EGD (ESOPHAGOGASTRODUODENOSCOPY)
Anesthesia: Moderate Sedation

## 2015-03-10 MED ORDER — MIDAZOLAM HCL 5 MG/ML IJ SOLN
INTRAMUSCULAR | Status: AC
  Filled 2015-03-10: qty 2

## 2015-03-10 MED ORDER — PNEUMOCOCCAL VAC POLYVALENT 25 MCG/0.5ML IJ INJ
0.5000 mL | INJECTION | INTRAMUSCULAR | Status: AC
  Administered 2015-03-12: 0.5 mL via INTRAMUSCULAR
  Filled 2015-03-10 (×3): qty 0.5

## 2015-03-10 MED ORDER — SODIUM CHLORIDE 0.9 % IV SOLN
INTRAVENOUS | Status: DC
  Administered 2015-03-10: 13:00:00 via INTRAVENOUS

## 2015-03-10 MED ORDER — FENTANYL CITRATE 0.05 MG/ML IJ SOLN
INTRAMUSCULAR | Status: DC | PRN
  Administered 2015-03-10 (×2): 25 ug via INTRAVENOUS

## 2015-03-10 MED ORDER — SUCRALFATE 1 G PO TABS
1.0000 g | ORAL_TABLET | Freq: Three times a day (TID) | ORAL | Status: DC
  Administered 2015-03-10 – 2015-03-12 (×8): 1 g via ORAL
  Filled 2015-03-10 (×13): qty 1

## 2015-03-10 MED ORDER — FENTANYL CITRATE 0.05 MG/ML IJ SOLN
INTRAMUSCULAR | Status: AC
  Filled 2015-03-10: qty 2

## 2015-03-10 MED ORDER — ALBUTEROL SULFATE (2.5 MG/3ML) 0.083% IN NEBU: 2.5000 mg | INHALATION_SOLUTION | Freq: Four times a day (QID) | RESPIRATORY_TRACT | Status: DC | PRN

## 2015-03-10 MED ORDER — INFLUENZA VAC SPLIT QUAD 0.5 ML IM SUSY
0.5000 mL | PREFILLED_SYRINGE | INTRAMUSCULAR | Status: AC
  Administered 2015-03-12: 0.5 mL via INTRAMUSCULAR
  Filled 2015-03-10 (×2): qty 0.5

## 2015-03-10 MED ORDER — ATORVASTATIN CALCIUM 20 MG PO TABS
20.0000 mg | ORAL_TABLET | Freq: Every day | ORAL | Status: DC
  Administered 2015-03-10 – 2015-03-12 (×3): 20 mg via ORAL
  Filled 2015-03-10 (×3): qty 1

## 2015-03-10 MED ORDER — ALBUTEROL SULFATE HFA 108 (90 BASE) MCG/ACT IN AERS: 1.0000 | INHALATION_SPRAY | Freq: Four times a day (QID) | RESPIRATORY_TRACT | Status: DC | PRN

## 2015-03-10 MED ORDER — METHIMAZOLE 5 MG PO TABS
5.0000 mg | ORAL_TABLET | Freq: Every day | ORAL | Status: DC
  Administered 2015-03-10 – 2015-03-12 (×3): 5 mg via ORAL
  Filled 2015-03-10 (×3): qty 1

## 2015-03-10 MED ORDER — SODIUM CHLORIDE 0.9 % IV SOLN
80.0000 mg | Freq: Once | INTRAVENOUS | Status: AC
  Administered 2015-03-10: 80 mg via INTRAVENOUS
  Filled 2015-03-10: qty 80

## 2015-03-10 MED ORDER — ACETAMINOPHEN 325 MG PO TABS
650.0000 mg | ORAL_TABLET | Freq: Four times a day (QID) | ORAL | Status: DC | PRN
  Administered 2015-03-11 – 2015-03-12 (×2): 650 mg via ORAL
  Filled 2015-03-10: qty 2

## 2015-03-10 MED ORDER — ACETAMINOPHEN 650 MG RE SUPP: 650.0000 mg | Freq: Four times a day (QID) | RECTAL | Status: DC | PRN

## 2015-03-10 MED ORDER — ONDANSETRON HCL 4 MG/2ML IJ SOLN: 4.0000 mg | Freq: Four times a day (QID) | INTRAMUSCULAR | Status: DC | PRN

## 2015-03-10 MED ORDER — SODIUM CHLORIDE 0.9 % IJ SOLN
3.0000 mL | Freq: Two times a day (BID) | INTRAMUSCULAR | Status: DC
  Administered 2015-03-10 – 2015-03-12 (×4): 3 mL via INTRAVENOUS

## 2015-03-10 MED ORDER — SODIUM CHLORIDE 0.9 % IV SOLN
INTRAVENOUS | Status: DC
  Administered 2015-03-10: 02:00:00 via INTRAVENOUS

## 2015-03-10 MED ORDER — PANTOPRAZOLE SODIUM 40 MG IV SOLR
40.0000 mg | Freq: Two times a day (BID) | INTRAVENOUS | Status: DC
  Administered 2015-03-10 (×2): 40 mg via INTRAVENOUS
  Filled 2015-03-10 (×4): qty 40

## 2015-03-10 MED ORDER — ONDANSETRON HCL 4 MG PO TABS: 4.0000 mg | ORAL_TABLET | Freq: Four times a day (QID) | ORAL | Status: DC | PRN

## 2015-03-10 MED ORDER — BUTAMBEN-TETRACAINE-BENZOCAINE 2-2-14 % EX AERO
INHALATION_SPRAY | CUTANEOUS | Status: DC | PRN
Start: 2015-03-10 — End: 2015-03-10
  Administered 2015-03-10: 2 via TOPICAL

## 2015-03-10 MED ORDER — MIDAZOLAM HCL 10 MG/2ML IJ SOLN
INTRAMUSCULAR | Status: DC | PRN
Start: 2015-03-10 — End: 2015-03-10
  Administered 2015-03-10: 2 mg via INTRAVENOUS
  Administered 2015-03-10: 1 mg via INTRAVENOUS
  Administered 2015-03-10: 2 mg via INTRAVENOUS
  Administered 2015-03-10: 1 mg via INTRAVENOUS

## 2015-03-10 MED ORDER — NITROGLYCERIN 0.4 MG SL SUBL: 0.4000 mg | SUBLINGUAL_TABLET | SUBLINGUAL | Status: DC | PRN

## 2015-03-10 NOTE — Consult Note (Signed)
Subjective:   HPI  The patient is a 74 year old female who we are asked to see in consultation in regards to melena and anemia. The patient presented to the emergency room with progressive weakness. She reported having dark stools for the past couple of weeks. Yesterday she had a little indigestion and reflux symptoms. She denies any history of peptic ulcer disease. She does take Plavix and aspirin. Her hemoglobin was 6.4 on presentation to the emergency room. She denied hematemesis.  Review of Systems Weakness. Melena. No complaints of syncope. Indigestion.  Past Medical History  Diagnosis Date  . STEMI (ST elevation myocardial infarction) May 2011    Promus DES in the proximal, mid and distal RCA in May 2011  . HTN (hypertension)   . Hyperlipidemia   . PVD (peripheral vascular disease)     Lower extremity Dopplers  May 2revealed an ABI of 0.83 in the right posterior   . COPD (chronic obstructive pulmonary disease)   . Hyperthyroidism     treated wtih radioactive iodine  . OA (osteoarthritis of spine)   . Stroke    Past Surgical History  Procedure Laterality Date  . Total hip arthroplasty  2004    left  . Abdominal hysterectomy      Had ovarian resection and required lysis of adhesions  . Cardiac catheterization  04/07/2010    EF 60-70%  . Coronary stents     History   Social History  . Marital Status: Married    Spouse Name: N/A  . Number of Children: 3  . Years of Education: N/A   Occupational History  . Retired    Social History Main Topics  . Smoking status: Former Smoker -- 1.00 packs/day for 45 years    Types: Cigarettes    Quit date: 04/07/2010  . Smokeless tobacco: Not on file  . Alcohol Use: No  . Drug Use: No  . Sexual Activity: Not on file   Other Topics Concern  . Not on file   Social History Narrative   family history includes Heart attack in her father; Peripheral vascular disease in her mother.  Current facility-administered medications:  .   0.9 %  sodium chloride infusion, , Intravenous, Continuous, Rolly SalterPranav M Patel, MD, Last Rate: 75 mL/hr at 03/10/15 0156 .  acetaminophen (TYLENOL) tablet 650 mg, 650 mg, Oral, Q6H PRN **OR** acetaminophen (TYLENOL) suppository 650 mg, 650 mg, Rectal, Q6H PRN, Rolly SalterPranav M Patel, MD .  albuterol (PROVENTIL) (2.5 MG/3ML) 0.083% nebulizer solution 2.5 mg, 2.5 mg, Nebulization, Q6H PRN, Rolly SalterPranav M Patel, MD .  atorvastatin (LIPITOR) tablet 20 mg, 20 mg, Oral, Daily, Rolly SalterPranav M Patel, MD .  Melene Muller[START ON 03/11/2015] Influenza vac split quadrivalent PF (FLUARIX) injection 0.5 mL, 0.5 mL, Intramuscular, Tomorrow-1000, Rolly SalterPranav M Patel, MD .  methimazole (TAPAZOLE) tablet 5 mg, 5 mg, Oral, Daily, Rolly SalterPranav M Patel, MD .  nitroGLYCERIN (NITROSTAT) SL tablet 0.4 mg, 0.4 mg, Sublingual, Q5 min PRN, Rolly SalterPranav M Patel, MD .  ondansetron (ZOFRAN) tablet 4 mg, 4 mg, Oral, Q6H PRN **OR** ondansetron (ZOFRAN) injection 4 mg, 4 mg, Intravenous, Q6H PRN, Rolly SalterPranav M Patel, MD .  pantoprazole (PROTONIX) injection 40 mg, 40 mg, Intravenous, Q12H, Rolly SalterPranav M Patel, MD, 0 mg at 03/10/15 0041 .  [START ON 03/11/2015] pneumococcal 23 valent vaccine (PNU-IMMUNE) injection 0.5 mL, 0.5 mL, Intramuscular, Tomorrow-1000, Rolly SalterPranav M Patel, MD .  sodium chloride 0.9 % injection 3 mL, 3 mL, Intravenous, Q12H, Rolly SalterPranav M Patel, MD, 3 mL at 03/10/15 0015 Allergies  Allergen Reactions  . Iodinated Diagnostic Agents Anaphylaxis  . Shellfish-Derived Products Anaphylaxis  . Hydrocodone Other (See Comments)    unknown     Objective:     BP 105/53 mmHg  Pulse 70  Temp(Src) 98.2 F (36.8 C) (Oral)  Resp 16  Ht  (1.549 m)  Wt 57.289 kg (126 lb 4.8 oz)  BMI 23.88 kg/m2  SpO2 98%  She is in no distress  Nonicteric  Heart regular rhythm no murmurs  Lungs clear  Abdomen: Bowel sounds normal, soft, nontender, no hepatosplenomegaly  Laboratory No components found for: D1    Assessment:     #1. Melena  #2. Anemia      Plan:     Given the  history of melena and finding of anemia it is likely that she has been having a slow upper GI bleed. I will schedule her for EGD today. She tells me that she thinks she had a colonoscopy but it was many years ago. She denied any bright red rectal bleeding at this time. Lab Results  Component Value Date   HGB 6.4* 03/09/2015   HGB 9.6* 07/31/2014   HGB 12.3 05/31/2010   HCT 20.5* 03/09/2015   HCT 30.0* 07/31/2014   HCT 37.6 05/31/2010   ALKPHOS 68 03/09/2015   ALKPHOS 66 07/31/2014   ALKPHOS 66 07/31/2014   AST 21 03/09/2015   AST 20 07/31/2014   AST 20 07/31/2014   ALT 14 03/09/2015   ALT 10 07/31/2014   ALT 10 07/31/2014

## 2015-03-10 NOTE — Op Note (Signed)
[facility] [address] [place], [id number]   ENDOSCOPY PROCEDURE REPORT  PATIENT: Kristine [name], Kristine [name]  MR#: [id number] BIRTHDATE: [birth year] , 73  yrs. old GENDER: female ENDOSCOPIST: Wandalee FerdinandSam Donique Hammonds, MD REFERRED BY: PROCEDURE DATE:  03/10/2015 PROCEDURE:  EGD ASA CLASS:     2 INDICATIONS:  melena for 2 weeks, anemia MEDICATIONS: fentanyl 50 g IV, Versed 6 mg IV TOPICAL ANESTHETIC:  DESCRIPTION OF PROCEDURE: After the risks benefits and alternatives of the procedure were thoroughly explained, informed consent was obtained.  The [employer] G[employer]OR[employer]COPE [id number] endoscope was introduced through the mouth and advanced to the second portion of the duodenum , Without limitations.  The instrument was slowly withdrawn as the mucosa was fully examined.  Findings:  Esophagus: Normal  Stomach: Upon entering the stomach there was old blood present. This was able to be suctioned. The mucosa was washed and inspection of the mucosa of the stomach was done. There were no obvious sites of bleeding however. I saw no evidence of ulcers, tumors, erosions, or vascular abnormalities to explain the blood seen. There were no gastric varices.  after suctioning theold blood out of the stomach and washing the mucosa I did not see any area of active bleeding or oozing.  Duodenum: There were no obvious lesions to explain bleeding in the duodenum.    The scope was then withdrawn from the patient and the procedure completed.  COMPLICATIONS: There were no immediate complications.  ENDOSCOPIC IMPRESSION: see above  RECOMMENDATIONS:PPI therapy. Add Carafate. Advance diet. Monitor for further bleeding.follow hemoglobin and hematocrit.   REPEAT EXAM:  eSignedWandalee Ferdinand:  Sam Teegan Brandis, MD 03/10/2015 12:00 PM    CC:  CPT CODES: ICD CODES:  The ICD and CPT codes recommended by this software are interpretations from the data that the clinical staff has captured with the software.  The  verification of the translation of this report to the ICD and CPT codes and modifiers is the sole responsibility of the health care institution and practicing physician where this report was generated.  [employer] will not be held responsible for the validity of the ICD and CPT codes included on this report.  AMA assumes no liability for data contained or not contained herein. CPT is a Publishing rights managerregistered trademark of the [employer].  PATIENT NAME:  Kristine [name], Kristine [name] MR#: [id number]

## 2015-03-10 NOTE — Progress Notes (Addendum)
  PROGRESS NOTE  Kristine [name] OZH:[id number] DOB: 05-01-1941 DOA: 03/09/2015 PCP: Burtis JunesBLOUNT,ALVIN VINCENT, MD  Assessment/Plan:  Melena -tarry stolls -H&H is significantly low. -s/p 2 units -Protonix -GI: plan for EGD NPO.  ABLA -s/p transfusion  Essential hypertension. Holding antihypertensive medication in view of low blood pressure.  HypERthyroidism. Continue home medications  Code Status: full Family Communication: patient Disposition Plan:    Consultants:    Procedures:      HPI/Subjective: Feeling better No further BMs this AM  Objective: Filed Vitals:   03/10/15 0655  BP: 105/53  Pulse: 70  Temp: 98.2 F (36.8 C)  Resp: 16    Intake/Output Summary (Last 24 hours) at 03/10/15 0956 Last data filed at 03/10/15 0640  Gross per 24 hour  Intake 538.75 ml  Output      0 ml  Net 538.75 ml   Filed Weights   03/10/15 0039  Weight: 57.289 kg (126 lb 4.8 oz)    Exam:   General:  A+Ox3, NAD  Cardiovascular: rrr  Respiratory: clear  Abdomen: +Bs, soft  Musculoskeletal: no edema   Data Reviewed: Basic Metabolic Panel:  Recent Labs Lab 03/09/15 2140  NA 136  K 3.7  CL 105  CO2 27  GLUCOSE 101*  BUN 27*  CREATININE 0.90  CALCIUM 8.9  MG 2.2  PHOS 3.8   Liver Function Tests:  Recent Labs Lab 03/09/15 2140  AST 21  ALT 14  ALKPHOS 68  BILITOT 0.5  PROT 7.1  ALBUMIN 3.9   No results for input(s): LIPASE, AMYLASE in the last 168 hours. No results for input(s): AMMONIA in the last 168 hours. CBC:  Recent Labs Lab 03/09/15 2140  WBC 5.4  NEUTROABS 2.6  HGB 6.4*  HCT 20.5*  MCV 83.3  PLT 539*   Cardiac Enzymes: No results for input(s): CKTOTAL, CKMB, CKMBINDEX, TROPONINI in the last 168 hours. BNP (last 3 results) No results for input(s): BNP in the last 8760 hours.  ProBNP (last 3 results) No results for input(s): PROBNP in the last 8760 hours.  CBG: No results for input(s): GLUCAP in the last 168  hours.  No results found for this or any previous visit (from the past 240 hour(s)).   Studies: No results found.  Scheduled Meds: . atorvastatin  20 mg Oral Daily  . [START ON 03/11/2015] Influenza vac split quadrivalent PF  0.5 mL Intramuscular Tomorrow-1000  . methimazole  5 mg Oral Daily  . pantoprazole (PROTONIX) IV  40 mg Intravenous Q12H  . [START ON 03/11/2015] pneumococcal 23 valent vaccine  0.5 mL Intramuscular Tomorrow-1000  . sodium chloride  3 mL Intravenous Q12H   Continuous Infusions: . sodium chloride 75 mL/hr at 03/10/15 0156   Antibiotics Given (last 72 hours)    None      Principal Problem:   Melena Active Problems:   HTN (hypertension)   Hyperlipidemia   PVD (peripheral vascular disease)   COPD (chronic obstructive pulmonary disease)   Hyperthyroidism   CAD (coronary artery disease)   GI bleed    Time spent: 25 min    Kristine [name]  [facility] Pager [phone]. If 7PM-7AM, please contact night-coverage at [email or web address], password [email or web address] 03/10/2015, 9:56 AM  LOS: 1 day

## 2015-03-11 DIAGNOSIS — R509 Fever, unspecified: Secondary | ICD-10-CM | POA: Diagnosis not present

## 2015-03-11 DIAGNOSIS — D62 Acute posthemorrhagic anemia: Secondary | ICD-10-CM | POA: Diagnosis present

## 2015-03-11 LAB — CBC WITH DIFFERENTIAL/PLATELET
Basophils Absolute: 0.1 10*3/uL (ref 0.0–0.1)
Basophils Relative: 1 % (ref 0–1)
EOS ABS: 0.5 10*3/uL (ref 0.0–0.7)
Eosinophils Relative: 7 % — ABNORMAL HIGH (ref 0–5)
HCT: 29.7 % — ABNORMAL LOW (ref 36.0–46.0)
HEMOGLOBIN: 9.8 g/dL — AB (ref 12.0–15.0)
LYMPHS ABS: 1.7 10*3/uL (ref 0.7–4.0)
LYMPHS PCT: 25 % (ref 12–46)
MCH: 27.6 pg (ref 26.0–34.0)
MCHC: 33 g/dL (ref 30.0–36.0)
MCV: 83.7 fL (ref 78.0–100.0)
Monocytes Absolute: 0.6 10*3/uL (ref 0.1–1.0)
Monocytes Relative: 9 % (ref 3–12)
Neutro Abs: 4 10*3/uL (ref 1.7–7.7)
Neutrophils Relative %: 58 % (ref 43–77)
Platelets: 511 10*3/uL — ABNORMAL HIGH (ref 150–400)
RBC: 3.55 MIL/uL — ABNORMAL LOW (ref 3.87–5.11)
RDW: 16.9 % — ABNORMAL HIGH (ref 11.5–15.5)
WBC: 6.9 10*3/uL (ref 4.0–10.5)

## 2015-03-11 LAB — TYPE AND SCREEN
ABO/RH(D): AB POS
ANTIBODY SCREEN: NEGATIVE
UNIT DIVISION: 0
UNIT DIVISION: 0

## 2015-03-11 LAB — INFLUENZA PANEL BY PCR (TYPE A & B)
H1N1 flu by pcr: NOT DETECTED
Influenza A By PCR: NEGATIVE
Influenza B By PCR: NEGATIVE

## 2015-03-11 MED ORDER — PANTOPRAZOLE SODIUM 40 MG PO TBEC
40.0000 mg | DELAYED_RELEASE_TABLET | Freq: Two times a day (BID) | ORAL | Status: DC
  Administered 2015-03-11 – 2015-03-12 (×3): 40 mg via ORAL

## 2015-03-11 NOTE — Progress Notes (Signed)
[facility] Progress Note  Subjective: Patient passed melena last night. We talked about this and hopefully its old blood. She doesn't feel bad today.  Objective: Vital signs in last 24 hours: Temp:  [98.5 F (36.9 C)-102.6 F (39.2 C)] 102.6 F (39.2 C) (04/03 0501) Pulse Rate:  [69-100] 89 (04/03 0501) Resp:  [17-20] 18 (04/03 0501) BP: (108-113)/(61-70) 108/68 mmHg (04/03 0501) SpO2:  [98 %-100 %] 100 % (04/03 0501) Weight:  [59.648 kg (131 lb 8 oz)] 59.648 kg (131 lb 8 oz) (04/02 2242) Weight change: 2.359 kg (5 lb 3.2 oz)   PE:  Sitting up in bed. In no acute distress  Lab Results: Results for orders placed or performed during the hospital encounter of 03/09/15 (from the past 24 hour(s))  CBC     Status: Abnormal   Collection Time: 03/10/15  5:15 PM  Result Value Ref Range   WBC 5.8 4.0 - 10.5 K/uL   RBC 3.48 (L) 3.87 - 5.11 MIL/uL   Hemoglobin 9.7 (L) 12.0 - 15.0 g/dL   HCT 40.929.1 (L) 81.136.0 - 91.446.0 %   MCV 83.6 78.0 - 100.0 fL   MCH 27.9 26.0 - 34.0 pg   MCHC 33.3 30.0 - 36.0 g/dL   RDW 78.216.2 (H) 95.611.5 - 21.315.5 %   Platelets 460 (H) 150 - 400 K/uL    Studies/Results: No results found.    Assessment: Melena  Plan: Continue to monitor. Continue PPI and Carafate    Waris Rodger F 03/11/2015, 2:13 PM

## 2015-03-11 NOTE — Progress Notes (Signed)
PROGRESS NOTE  Vear ClockBertha O Wagar ZOX:[id number] DOB: 1941/05/14 DOA: 03/09/2015 PCP: Burtis JunesBLOUNT,ALVIN VINCENT, MD  Assessment/Plan:  Melena EGD with old blood but no source of bleeding seen in the stomach or duodenum.  Change PPI to by mouth. Continue Carafate per GI. Had multiple melanotic stools last night. Hopefully "old blood". We'll check an H&H today and tomorrow. Patient is feeling much better.  Saline lock and discontinue telemetry.  ABLA -s/p transfusion 2 units packed red blood cells.  Fever: Patient had a temperature recorded at 5 AM of 102.6. This was not during blood transfusion. Patient reports feeling like she is "coming down with a cold" and felt cold last night, but denies rigors. No change in chronic cough from allergies. Does have some rhinorrhea. No myalgias. Will check blood cultures. Flu swab. Urinalysis with leukocyte esterase but not too many white cells, bacteria, and no nitrites.  Also denies urinary symptoms so hold off on treatment for now. No dyspnea. Monitor for further signs of fever.  Essential hypertension. Holding antihypertensive medication in view of low blood pressure.  HypERthyroidism. Continue home medications  Code Status: full Family Communication: Husband in room. Disposition Plan:    Consultants:  GI  Procedures:  EGD    HPI/Subjective: Multiple dark stools overnight. Denies weakness. Felt cold last night. Has rhinorrhea. No significant cough other than from allergies. No dyspnea or wheeze. No dysuria or frequency. No abdominal cramping. No nausea vomiting. Appetite is good.  Objective: Filed Vitals:   03/11/15 0501  BP: 108/68  Pulse: 89  Temp: 102.6 F (39.2 C)  Resp: 18    Intake/Output Summary (Last 24 hours) at 03/11/15 1148 Last data filed at 03/11/15 0900  Gross per 24 hour  Intake    600 ml  Output   1900 ml  Net  -1300 ml   Filed Weights   03/10/15 0039 03/10/15 2242  Weight: 57.289 kg (126 lb 4.8 oz) 59.648 kg (131  lb 8 oz)    Exam:   General:  A+Ox3, NAD  Cardiovascular: rrr without murmurs gallops rubs  Respiratory: clear without wheezes rhonchi or rales  Abdomen: +Bs, soft nontender nondistended  Musculoskeletal: no edema no clubbing cyanosis  Data Reviewed: Basic Metabolic Panel:  Recent Labs Lab 03/09/15 2140  NA 136  K 3.7  CL 105  CO2 27  GLUCOSE 101*  BUN 27*  CREATININE 0.90  CALCIUM 8.9  MG 2.2  PHOS 3.8   Liver Function Tests:  Recent Labs Lab 03/09/15 2140  AST 21  ALT 14  ALKPHOS 68  BILITOT 0.5  PROT 7.1  ALBUMIN 3.9   No results for input(s): LIPASE, AMYLASE in the last 168 hours. No results for input(s): AMMONIA in the last 168 hours. CBC:  Recent Labs Lab 03/09/15 2140 03/10/15 1715  WBC 5.4 5.8  NEUTROABS 2.6  --   HGB 6.4* 9.7*  HCT 20.5* 29.1*  MCV 83.3 83.6  PLT 539* 460*   Urinalysis    Component Value Date/Time   COLORURINE YELLOW 03/09/2015 2216   APPEARANCEUR CLOUDY* 03/09/2015 2216   LABSPEC 1.020 03/09/2015 2216   PHURINE 5.0 03/09/2015 2216   GLUCOSEU NEGATIVE 03/09/2015 2216   HGBUR NEGATIVE 03/09/2015 2216   BILIRUBINUR NEGATIVE 03/09/2015 2216   KETONESUR NEGATIVE 03/09/2015 2216   PROTEINUR NEGATIVE 03/09/2015 2216   UROBILINOGEN 0.2 03/09/2015 2216   NITRITE NEGATIVE 03/09/2015 2216   LEUKOCYTESUR LARGE* 03/09/2015 2216     Cardiac Enzymes: No results for input(s): CKTOTAL, CKMB, CKMBINDEX, TROPONINI in  the last 168 hours. BNP (last 3 results) No results for input(s): BNP in the last 8760 hours.  ProBNP (last 3 results) No results for input(s): PROBNP in the last 8760 hours.  CBG: No results for input(s): GLUCAP in the last 168 hours.  No results found for this or any previous visit (from the past 240 hour(s)).   Studies: No results found.  Scheduled Meds: . atorvastatin  20 mg Oral Daily  . Influenza vac split quadrivalent PF  0.5 mL Intramuscular Tomorrow-1000  . methimazole  5 mg Oral Daily  .  pantoprazole (PROTONIX) IV  40 mg Intravenous Q12H  . pneumococcal 23 valent vaccine  0.5 mL Intramuscular Tomorrow-1000  . sodium chloride  3 mL Intravenous Q12H  . sucralfate  1 g Oral TID WC & HS   Continuous Infusions: . sodium chloride 75 mL/hr at 03/10/15 0156  . sodium chloride 20 mL/hr at 03/10/15 1251   Antibiotics Given (last 72 hours)    None     Time spent: 25 min  Johnice Riebe L  [facility] [email or web address], password [email or web address] 03/11/2015, 11:48 AM  LOS: 2 days

## 2015-03-12 ENCOUNTER — Encounter (HOSPITAL_COMMUNITY): Payer: Self-pay | Admitting: Gastroenterology

## 2015-03-12 DIAGNOSIS — D62 Acute posthemorrhagic anemia: Secondary | ICD-10-CM

## 2015-03-12 DIAGNOSIS — K921 Melena: Secondary | ICD-10-CM | POA: Diagnosis not present

## 2015-03-12 LAB — BASIC METABOLIC PANEL
ANION GAP: 8 (ref 5–15)
BUN: 5 mg/dL — ABNORMAL LOW (ref 6–23)
CO2: 25 mmol/L (ref 19–32)
Calcium: 8.9 mg/dL (ref 8.4–10.5)
Chloride: 108 mmol/L (ref 96–112)
Creatinine, Ser: 0.71 mg/dL (ref 0.50–1.10)
GFR calc Af Amer: >90 mL/min (ref >90)
GFR, EST NON AFRICAN AMERICAN: 85 mL/min — AB (ref >90)
Glucose, Bld: 91 mg/dL (ref 70–99)
POTASSIUM: 3.7 mmol/L (ref 3.5–5.1)
SODIUM: 141 mmol/L (ref 135–145)

## 2015-03-12 LAB — CBC
HCT: 30.2 % — ABNORMAL LOW (ref 36.0–46.0)
HEMOGLOBIN: 9.9 g/dL — AB (ref 12.0–15.0)
MCH: 27.7 pg (ref 26.0–34.0)
MCHC: 32.8 g/dL (ref 30.0–36.0)
MCV: 84.6 fL (ref 78.0–100.0)
Platelets: 491 10*3/uL — ABNORMAL HIGH (ref 150–400)
RBC: 3.57 MIL/uL — AB (ref 3.87–5.11)
RDW: 17.3 % — ABNORMAL HIGH (ref 11.5–15.5)
WBC: 6.1 10*3/uL (ref 4.0–10.5)

## 2015-03-12 MED ORDER — PANTOPRAZOLE SODIUM 40 MG PO TBEC: 40.0000 mg | DELAYED_RELEASE_TABLET | Freq: Two times a day (BID) | ORAL | Status: DC

## 2015-03-12 NOTE — Care Management Note (Signed)
CARE MANAGEMENT NOTE 03/12/2015  Patient:  Kristine [name], Kristine [name]   Account Number:  [id number]  Date Initiated:  03/12/2015  Documentation initiated by:  Aniyia Rane  Subjective/Objective Assessment:   CM following for progression and d/c planning.     Action/Plan:   03/12/15 Met with pt re d/c needs, no needs identified, pt states that she is feeling well and is ambulatory in the room at this time. Has Family support.   Anticipated DC Date:  03/12/2015   Anticipated DC Plan:  HOME/SELF CARE         Choice offered to / List presented to:             Status of service:  Completed, signed off Medicare Important Message given?  YES (If response is "NO", the following Medicare IM given date fields will be blank) Date Medicare IM given:  03/12/2015 Medicare IM given by:  Shamel Germond Date Additional Medicare IM given:   Additional Medicare IM given by:    Discharge Disposition:  HOME/SELF CARE  Per UR Regulation:    If discussed at Long Length of Stay Meetings, dates discussed:    Comments:

## 2015-03-12 NOTE — Discharge Summary (Signed)
Physician Discharge Summary  Vear ClockBertha O Nagele RUE:[id number] DOB: 05/07/1941 DOA: 03/09/2015  PCP: Burtis JunesBLOUNT,ALVIN VINCENT, MD  Admit date: 03/09/2015 Discharge date: 03/12/2015  Time spent: greater than 30 minutes  Recommendations for Outpatient Follow-up:  Monitor h/h  Discharge Diagnoses:  Principal Problem:   Melena Active Problems:   HTN (hypertension)   Hyperlipidemia   PVD (peripheral vascular disease)   COPD (chronic obstructive pulmonary disease)   Hyperthyroidism   CAD (coronary artery disease)   GI bleed   Fever   Acute blood loss anemia   Discharge Condition: stable  Diet recommendation: heart healthy  Filed Weights   03/10/15 0039 03/10/15 2242 03/11/15 2100  Weight: 57.289 kg (126 lb 4.8 oz) 59.648 kg (131 lb 8 oz) 59.7 kg (131 lb 9.8 oz)    History of present illness:  74 y.o. female with Past medical history of coronary artery disease status post PCI 2011, peripheral vascular disease on dual Antiplatelet, COPD, hyperthyroidism. The patient is presenting with complaints of chronic fatigue ongoing since last 2 weeks. She also has noticed a chocolate color bowel movement since last 3 weeks. She denies any abdominal pain. Today she complains of some increased acid reflux. She complains of some dizziness and lightheadedness since last 3 weeks as well. She denies any nausea vomiting or abdominal pain or shortness of breath but 2 days ago she had some chest pain which resolved with nitroglycerin. She denies any leg swelling denies any burning urination or cough. She denies any prior history of colonoscopy  Hospital Course:  Melena EGD with old blood but no source of bleeding seen in the stomach or duodenum. Continue PPI. By discharge, feeling much better, h/h and blood pressure stable.  Without further signs of bleeding  Acute blood loss anemia:  -s/p transfusion 2 units packed red blood cells.  CAD: discussed with Dr. Clifton JamesMcAlhany dual antiplatelets. He recommended  continuing ASA 81 mg, stopping plavix.   Fever: Patient had a single temperature spike t 5 AM of 102.6. This was not during blood transfusion. No change in chronic cough from allergies. Does have some rhinorrhea. No myalgias. Blood cultures negative to date. Flu swab negative. Urinalysis with leukocyte esterase but not too many white cells, bacteria, and no nitrites. Also denies urinary symptoms so hold off on treatment for now. No dyspnea. Had no further fever. I suspect may have been erroneous  Essential hypertension. meds held during hospitalization in the setting of GI bleed  Procedures:  EGD  Consultations:  [facility]  Discharge Exam: Filed Vitals:   03/12/15 0813  BP: 140/78  Pulse: 67  Temp: 98.7 F (37.1 C)  Resp: 17    General: a and o Cardiovascular: RRR Respiratory: CTA abd s, nt, nd  Discharge Instructions   Discharge Instructions    Activity as tolerated - No restrictions    Complete by:  As directed      Diet - low sodium heart healthy    Complete by:  As directed      Discharge instructions    Complete by:  As directed   STOP PLAVIX.          Current Discharge Medication List    START taking these medications   Details  pantoprazole (PROTONIX) 40 MG tablet Take 1 tablet (40 mg total) by mouth 2 (two) times daily. Qty: 60 tablet, Refills: 0      CONTINUE these medications which have NOT CHANGED   Details  aspirin 81 MG tablet Take 81 mg by  mouth daily.      atorvastatin (LIPITOR) 20 MG tablet Take 1 tablet (20 mg total) by mouth daily. Qty: 30 tablet, Refills: 6    hydrALAZINE (APRESOLINE) 25 MG tablet Take 25 mg by mouth every other day.     lisinopril-hydrochlorothiazide (PRINZIDE,ZESTORETIC) 20-25 MG per tablet Take 1 tablet by mouth daily. Qty: 30 tablet, Refills: 3    methimazole (TAPAZOLE) 5 MG tablet Take 5 mg by mouth daily.    metoprolol succinate (TOPROL-XL) 50 MG 24 hr tablet Take 1 tablet (50 mg total) by mouth daily. Take  with or immediately following a meal. Qty: 30 tablet, Refills: 3    nitroGLYCERIN (NITROSTAT) 0.4 MG SL tablet Place 1 tablet (0.4 mg total) under the tongue every 5 (five) minutes as needed for chest pain. Qty: 25 tablet, Refills: 6   Associated Diagnoses: Coronary artery disease involving native coronary artery of native heart without angina pectoris    PROAIR HFA 108 (90 BASE) MCG/ACT inhaler Inhale 1 puff into the lungs every 6 (six) hours as needed for wheezing or shortness of breath.     Tavaborole (KERYDIN) 5 % SOLN Apply 1 drop to each affected toenail once daily for 12 months Qty: 10 mL, Refills: 11      STOP taking these medications     clopidogrel (PLAVIX) 75 MG tablet        Allergies  Allergen Reactions  . Iodinated Diagnostic Agents Anaphylaxis  . Shellfish-Derived Products Anaphylaxis  . Hydrocodone Other (See Comments)    unknown   Follow-up Information    Follow up with Burtis Junes, MD.   Specialty:  Family Medicine   Why:  TO CHECK BLOOD COUNT/HEMOGLOBIN   Contact information:   [address] [address] [facility] [address] [phone]        The results of significant diagnostics from this hospitalization (including imaging, microbiology, ancillary and laboratory) are listed below for reference.    Significant Diagnostic Studies: No results found.  Microbiology: No results found for this or any previous visit (from the past 240 hour(s)).   Labs: Basic Metabolic Panel:  Recent Labs Lab 03/09/15 2140 03/12/15 0708  NA 136 141  K 3.7 3.7  CL 105 108  CO2 27 25  GLUCOSE 101* 91  BUN 27* 5*  CREATININE 0.90 0.71  CALCIUM 8.9 8.9  MG 2.2  --   PHOS 3.8  --    Liver Function Tests:  Recent Labs Lab 03/09/15 2140  AST 21  ALT 14  ALKPHOS 68  BILITOT 0.5  PROT 7.1  ALBUMIN 3.9   No results for input(s): LIPASE, AMYLASE in the last 168 hours. No results for input(s): AMMONIA in the last 168 hours. CBC:  Recent  Labs Lab 03/09/15 2140 03/10/15 1715 03/11/15 1715 03/12/15 0708  WBC 5.4 5.8 6.9 6.1  NEUTROABS 2.6  --  4.0  --   HGB 6.4* 9.7* 9.8* 9.9*  HCT 20.5* 29.1* 29.7* 30.2*  MCV 83.3 83.6 83.7 84.6  PLT 539* 460* 511* 491*   Cardiac Enzymes: No results for input(s): CKTOTAL, CKMB, CKMBINDEX, TROPONINI in the last 168 hours. BNP: BNP (last 3 results) No results for input(s): BNP in the last 8760 hours.  ProBNP (last 3 results) No results for input(s): PROBNP in the last 8760 hours.  CBG: No results for input(s): GLUCAP in the last 168 hours.     SignedChristiane Ha  [facility] 03/12/2015, 10:46 AM

## 2015-03-18 LAB — CULTURE, BLOOD (ROUTINE X 2)
Culture: NO GROWTH
Culture: NO GROWTH

## 2015-03-22 DIAGNOSIS — K274 Chronic or unspecified peptic ulcer, site unspecified, with hemorrhage: Secondary | ICD-10-CM | POA: Diagnosis not present

## 2015-03-27 DIAGNOSIS — K272 Acute peptic ulcer, site unspecified, with both hemorrhage and perforation: Secondary | ICD-10-CM | POA: Diagnosis not present

## 2015-04-05 DIAGNOSIS — K922 Gastrointestinal hemorrhage, unspecified: Secondary | ICD-10-CM | POA: Diagnosis not present

## 2015-05-08 ENCOUNTER — Ambulatory Visit: Payer: Medicare Other

## 2015-05-10 ENCOUNTER — Encounter: Payer: Self-pay | Admitting: Podiatry

## 2015-05-10 ENCOUNTER — Ambulatory Visit (INDEPENDENT_AMBULATORY_CARE_PROVIDER_SITE_OTHER): Payer: Medicare Other | Admitting: Podiatry

## 2015-05-10 DIAGNOSIS — M79676 Pain in unspecified toe(s): Secondary | ICD-10-CM

## 2015-05-10 DIAGNOSIS — Q828 Other specified congenital malformations of skin: Secondary | ICD-10-CM

## 2015-05-10 DIAGNOSIS — B351 Tinea unguium: Secondary | ICD-10-CM | POA: Diagnosis not present

## 2015-05-10 NOTE — Progress Notes (Signed)
Patient ID: Kristine [name], female   DOB: 20-Nov-1941, 74 y.o.   MRN: [id number] Complaint:  Visit Type: Patient returns to my office for continued preventative foot care services. Complaint: Patient states" my nails have grown long and thick and become painful to walk and wear shoes"  She also has developed callus on both feet which hurt when walking.  She presents for preventative foot care services. No changes to ROS  Podiatric Exam: Vascular: dorsalis pedis and posterior tibial pulses are palpable bilateral. Capillary return is immediate. Temperature gradient is WNL. Skin turgor WNL  Sensorium: Normal Semmes Weinstein monofilament test. Normal tactile sensation bilaterally. Nail Exam: Pt has thick disfigured discolored nails with subungual debris noted bilateral entire nail hallux through fifth toenails Ulcer Exam: There is no evidence of ulcer or pre-ulcerative changes or infection. Orthopedic Exam: Muscle tone and strength are WNL. No limitations in general ROM. No crepitus or effusions noted. Foot type and digits show no abnormalities. Bony prominences are unremarkable. Skin:  Porokeratosis fifth methead right foot and porokeratosis fifth metatarsal left foot. No infection or ulcers  Diagnosis:  Tinea unguium, Pain in right toe, pain in left toes Porokeratosis B/L  Treatment & Plan Procedures and Treatment: Consent by patient was obtained for treatment procedures. The patient understood the discussion of treatment and procedures well. All questions were answered thoroughly reviewed. Debridement of mycotic and hypertrophic toenails, 1 through 5 bilateral and clearing of subungual debris. No ulceration, no infection noted. Debridement of porokeratosis  Return Visit-Office Procedure: Patient instructed to return to the office for a follow up visit 3 months for continued evaluation and treatment.

## 2015-05-30 ENCOUNTER — Inpatient Hospital Stay (HOSPITAL_COMMUNITY)
Admission: EM | Admit: 2015-05-30 | Discharge: 2015-05-31 | DRG: 916 | Disposition: A | Discharge status: Home / Self Care | Payer: Medicare Other | Attending: Emergency Medicine | Admitting: Emergency Medicine

## 2015-05-30 ENCOUNTER — Encounter (HOSPITAL_COMMUNITY): Payer: Self-pay | Admitting: Emergency Medicine

## 2015-05-30 DIAGNOSIS — I739 Peripheral vascular disease, unspecified: Secondary | ICD-10-CM | POA: Diagnosis present

## 2015-05-30 DIAGNOSIS — M1990 Unspecified osteoarthritis, unspecified site: Secondary | ICD-10-CM | POA: Diagnosis present

## 2015-05-30 DIAGNOSIS — Z7902 Long term (current) use of antithrombotics/antiplatelets: Secondary | ICD-10-CM

## 2015-05-30 DIAGNOSIS — Z79899 Other long term (current) drug therapy: Secondary | ICD-10-CM

## 2015-05-30 DIAGNOSIS — T783XXA Angioneurotic edema, initial encounter: Secondary | ICD-10-CM | POA: Diagnosis not present

## 2015-05-30 DIAGNOSIS — T464X5A Adverse effect of angiotensin-converting-enzyme inhibitors, initial encounter: Secondary | ICD-10-CM | POA: Diagnosis not present

## 2015-05-30 DIAGNOSIS — R220 Localized swelling, mass and lump, head: Secondary | ICD-10-CM | POA: Diagnosis not present

## 2015-05-30 DIAGNOSIS — J449 Chronic obstructive pulmonary disease, unspecified: Secondary | ICD-10-CM | POA: Diagnosis present

## 2015-05-30 DIAGNOSIS — E0590 Thyrotoxicosis, unspecified without thyrotoxic crisis or storm: Secondary | ICD-10-CM | POA: Diagnosis present

## 2015-05-30 DIAGNOSIS — I2510 Atherosclerotic heart disease of native coronary artery without angina pectoris: Secondary | ICD-10-CM | POA: Diagnosis present

## 2015-05-30 DIAGNOSIS — I252 Old myocardial infarction: Secondary | ICD-10-CM | POA: Diagnosis not present

## 2015-05-30 DIAGNOSIS — Z8673 Personal history of transient ischemic attack (TIA), and cerebral infarction without residual deficits: Secondary | ICD-10-CM

## 2015-05-30 DIAGNOSIS — E785 Hyperlipidemia, unspecified: Secondary | ICD-10-CM | POA: Diagnosis present

## 2015-05-30 DIAGNOSIS — Z87891 Personal history of nicotine dependence: Secondary | ICD-10-CM

## 2015-05-30 DIAGNOSIS — Z955 Presence of coronary angioplasty implant and graft: Secondary | ICD-10-CM

## 2015-05-30 DIAGNOSIS — Z7982 Long term (current) use of aspirin: Secondary | ICD-10-CM

## 2015-05-30 DIAGNOSIS — Y929 Unspecified place or not applicable: Secondary | ICD-10-CM

## 2015-05-30 DIAGNOSIS — I10 Essential (primary) hypertension: Secondary | ICD-10-CM | POA: Diagnosis not present

## 2015-05-30 DIAGNOSIS — Z96642 Presence of left artificial hip joint: Secondary | ICD-10-CM | POA: Diagnosis present

## 2015-05-30 LAB — CBC WITH DIFFERENTIAL/PLATELET
Basophils Absolute: 0.1 10*3/uL (ref 0.0–0.1)
Basophils Relative: 1 % (ref 0–1)
Eosinophils Absolute: 0.3 10*3/uL (ref 0.0–0.7)
Eosinophils Relative: 5 % (ref 0–5)
HEMATOCRIT: 39.7 % (ref 36.0–46.0)
Hemoglobin: 12.7 g/dL (ref 12.0–15.0)
LYMPHS ABS: 2 10*3/uL (ref 0.7–4.0)
Lymphocytes Relative: 33 % (ref 12–46)
MCH: 26.8 pg (ref 26.0–34.0)
MCHC: 32 g/dL (ref 30.0–36.0)
MCV: 83.9 fL (ref 78.0–100.0)
Monocytes Absolute: 0.4 10*3/uL (ref 0.1–1.0)
Monocytes Relative: 7 % (ref 3–12)
NEUTROS ABS: 3.3 10*3/uL (ref 1.7–7.7)
Neutrophils Relative %: 54 % (ref 43–77)
PLATELETS: 396 10*3/uL (ref 150–400)
RBC: 4.73 MIL/uL (ref 3.87–5.11)
RDW: 17.7 % — AB (ref 11.5–15.5)
WBC: 6.1 10*3/uL (ref 4.0–10.5)

## 2015-05-30 LAB — BASIC METABOLIC PANEL
ANION GAP: 6 (ref 5–15)
BUN: 22 mg/dL — ABNORMAL HIGH (ref 6–20)
CALCIUM: 9.3 mg/dL (ref 8.9–10.3)
CO2: 26 mmol/L (ref 22–32)
Chloride: 105 mmol/L (ref 101–111)
Creatinine, Ser: 0.98 mg/dL (ref 0.44–1.00)
GFR calc Af Amer: >60 mL/min (ref >60)
GFR, EST NON AFRICAN AMERICAN: 57 mL/min — AB (ref >60)
Glucose, Bld: 100 mg/dL — ABNORMAL HIGH (ref 65–99)
Potassium: 3.7 mmol/L (ref 3.5–5.1)
Sodium: 137 mmol/L (ref 135–145)

## 2015-05-30 MED ORDER — FAMOTIDINE IN NACL 20-0.9 MG/50ML-% IV SOLN
20.0000 mg | Freq: Once | INTRAVENOUS | Status: AC
  Administered 2015-05-30: 20 mg via INTRAVENOUS
  Filled 2015-05-30: qty 50

## 2015-05-30 MED ORDER — METHYLPREDNISOLONE SODIUM SUCC 125 MG IJ SOLR
125.0000 mg | Freq: Once | INTRAMUSCULAR | Status: AC
  Administered 2015-05-30: 125 mg via INTRAVENOUS
  Filled 2015-05-30: qty 2

## 2015-05-30 MED ORDER — DIPHENHYDRAMINE HCL 50 MG/ML IJ SOLN
25.0000 mg | Freq: Once | INTRAMUSCULAR | Status: AC
  Administered 2015-05-30: 25 mg via INTRAVENOUS
  Filled 2015-05-30: qty 1

## 2015-05-30 MED ORDER — DEXAMETHASONE SODIUM PHOSPHATE 4 MG/ML IJ SOLN
4.0000 mg | Freq: Once | INTRAMUSCULAR | Status: AC
  Administered 2015-05-31: 4 mg via INTRAVENOUS
  Filled 2015-05-30: qty 1

## 2015-05-30 NOTE — ED Provider Notes (Signed)
CSN: [id number]     Arrival date & time 05/30/15  2115 History   First MD Initiated Contact with Patient 05/30/15 2135     Chief Complaint  Patient presents with  . Oral Swelling     (Consider location/radiation/quality/duration/timing/severity/associated sxs/prior Treatment) HPI Comments: Patient with past medical history of hypertension, hyperlipidemia, COPD, stroke, and prior MI presents to the emergency department with chief complaint of tongue swelling. She states the symptoms started around 7:30 tonight. She denies any known contact with allergens. 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 She states that it feels like it is getting larger and hard to breathe. She denies any wheezing. Denies nausea, vomiting, or diarrhea. She takes lisinopril for her hypertension.  The history is provided by the patient. No language interpreter was used.    Past Medical History  Diagnosis Date  . STEMI (ST elevation myocardial infarction) May 2011    Promus DES in the proximal, mid and distal RCA in May 2011  . HTN (hypertension)   . Hyperlipidemia   . PVD (peripheral vascular disease)     Lower extremity Dopplers  May 2revealed an ABI of 0.83 in the right posterior   . COPD (chronic obstructive pulmonary disease)   . Hyperthyroidism     treated wtih radioactive iodine  . OA (osteoarthritis of spine)   . Stroke    Past Surgical History  Procedure Laterality Date  . Total hip arthroplasty  2004    left  . Abdominal hysterectomy      Had ovarian resection and required lysis of adhesions  . Cardiac catheterization  04/07/2010    EF 60-70%  . Coronary stents    . Esophagogastroduodenoscopy N/A 03/10/2015    Procedure: ESOPHAGOGASTRODUODENOSCOPY (EGD);  Surgeon: Wandalee Ferdinand, MD;  Location: [facility] ENDOSCOPY;  Service: Endoscopy;  Laterality: N/A;   Family History  Problem Relation Age of Onset  . Heart attack  Father   . Peripheral vascular disease Mother    History  Substance Use Topics  . Smoking status: Former Smoker -- 1.00 packs/day for 45 years    Types: Cigarettes    Quit date: 04/07/2010  . Smokeless tobacco: Not on file  . Alcohol Use: No   OB History    No data available     Review of Systems  Constitutional: Negative for fever and chills.  HENT:       Tongue swelling  Respiratory: Negative for shortness of breath.   Cardiovascular: Negative for chest pain.  Gastrointestinal: Negative for nausea, vomiting, diarrhea and constipation.  Genitourinary: Negative for dysuria.  All other systems reviewed and are negative.     Allergies  Iodinated diagnostic agents; Shellfish-derived products; Hydrocodone; and Other  Home Medications   Prior to Admission medications   Medication Sig Start Date End Date Taking? Authorizing Provider  aspirin 81 MG tablet Take 81 mg by mouth daily.     Yes Historical Provider, MD  atorvastatin (LIPITOR) 20 MG tablet Take 1 tablet (20 mg total) by mouth daily. 02/08/15  Yes Kathleene Hazel, MD  clopidogrel (PLAVIX) 75 MG tablet Take 75 mg by mouth daily.  03/07/15  Yes Historical Provider, MD  hydrALAZINE (APRESOLINE) 25 MG tablet Take 25 mg by mouth every other day.    Yes Historical Provider, MD  lisinopril-hydrochlorothiazide (PRINZIDE,ZESTORETIC) 20-25 MG per tablet Take 1 tablet by mouth daily. 02/08/15  Yes Kathleene Hazel, MD  methimazole (TAPAZOLE) 5 MG tablet Take 5 mg by mouth daily.   Yes Historical Provider, MD  metoprolol succinate (TOPROL-XL) 50 MG 24 hr tablet Take 1 tablet (50 mg total) by mouth daily. Take with or immediately following a meal. 02/08/15  Yes Kathleene Hazel, MD  nitroGLYCERIN (NITROSTAT) 0.4 MG SL tablet Place 1 tablet (0.4 mg total) under the tongue every 5 (five) minutes as needed for chest pain. 01/04/15  Yes Kathleene Hazel, MD  pantoprazole (PROTONIX) 40 MG tablet Take 1 tablet (40 mg total)  by mouth 2 (two) times daily. 03/12/15  Yes Christiane Ha, MD  PROAIR HFA 108 (90 BASE) MCG/ACT inhaler Inhale 1 puff into the lungs every 6 (six) hours as needed for wheezing or shortness of breath.  10/31/13  Yes Historical Provider, MD  Tavaborole (KERYDIN) 5 % SOLN Apply 1 drop to each affected toenail once daily for 12 months 02/07/14  Yes Richard Sikora, DPM   BP 122/78 mmHg  Pulse 66  Temp(Src) 97.6 F (36.4 C) (Oral)  Resp 18  SpO2 100% Physical Exam  Constitutional: She is oriented to person, place, and time. She appears well-developed and well-nourished.  HENT:  Head: Normocephalic and atraumatic.  Left-sided tongue swelling, no lip swelling, no peritonsillar or tonsillar abscess, airway is intact, no stridor  Eyes: Conjunctivae and EOM are normal. Pupils are equal, round, and reactive to light.  Neck: Normal range of motion. Neck supple.  Cardiovascular: Normal rate and regular rhythm.  Exam reveals no gallop and no friction rub.   No murmur heard. Pulmonary/Chest: Effort normal and breath sounds normal. No respiratory distress. She has no wheezes. She has no rales. She exhibits no tenderness.  Lungs are clear to auscultation bilaterally  Abdominal: Soft. Bowel sounds are normal. She exhibits no distension and no mass. There is no tenderness. There is no rebound and no guarding.  No abdominal tenderness  Musculoskeletal: Normal range of motion. She exhibits no edema or tenderness.  Neurological: She is alert and oriented to person, place, and time.  Skin: Skin is warm and dry.  No rash  Psychiatric: She has a normal mood and affect. Her behavior is normal. Judgment and thought content normal.  Nursing note and vitals reviewed.   ED Course  Procedures (including critical care time) Labs Review Labs Reviewed - No data to display  Imaging Review No results found.   EKG Interpretation None      MDM   Final diagnoses:  ACE inhibitor-aggravated angioedema, initial  encounter      9:46 PM Patient examined by me, has significant left-sided tongue swelling, presumably from lisinopril. Will treat with Pepcid, Benadryl, and Solu-Medrol.  Will hold epinephrine given history of STEMI.  Patient seen by and discussed with Dr. Fayrene Fearing, who recommends 4 hour observation, and admission if symptoms worsen. Cautiously optimistic, the patient will be able to be discharged after 4 hours. The tongue remains soft, does not seem to be spreading. She has no evidence of anaphylactic reaction.  11:44 PM Patient reassessed.  Tongue swelling seems to have worsened, still no stridor, no wheezing.  She is tolerating secretions.  Patient discussed with Dr. Silverio Lay, who recommends consulting ENT.     11:45 Patient discussed with Dr. Suszanne Conners, who recommends decadron and admission to step-down.  Dr. Suszanne Conners will see the patient in the morning.  12:00 Appreciate Dr. Adela Glimpse for admitting the patient.  Roxy Horseman, PA-C 05/31/15 0007  Rolland Porter, MD 06/08/15 1344

## 2015-05-30 NOTE — ED Notes (Signed)
Pt states she started having some left sided tongue swelling/tonsil swelling around 7:30 tonight. Pt states she's unsure as to what could've made her tongue swell. Pt states she's not having any difficulty breathing. States the only time she has any kind of reaction like this is when she eats certain fruits to which she then has an anaphylactic reaction to. States she did eat some unknown fruit yesterday afternoon, but doesn't believe that could've caused the swelling to happen tonight.

## 2015-05-31 ENCOUNTER — Telehealth: Payer: Self-pay | Admitting: *Deleted

## 2015-05-31 ENCOUNTER — Telehealth: Payer: Self-pay

## 2015-05-31 ENCOUNTER — Encounter (HOSPITAL_COMMUNITY): Payer: Self-pay | Admitting: Internal Medicine

## 2015-05-31 DIAGNOSIS — I10 Essential (primary) hypertension: Secondary | ICD-10-CM

## 2015-05-31 DIAGNOSIS — E0590 Thyrotoxicosis, unspecified without thyrotoxic crisis or storm: Secondary | ICD-10-CM | POA: Diagnosis present

## 2015-05-31 DIAGNOSIS — Z8673 Personal history of transient ischemic attack (TIA), and cerebral infarction without residual deficits: Secondary | ICD-10-CM | POA: Diagnosis not present

## 2015-05-31 DIAGNOSIS — Z87891 Personal history of nicotine dependence: Secondary | ICD-10-CM | POA: Diagnosis not present

## 2015-05-31 DIAGNOSIS — Z7902 Long term (current) use of antithrombotics/antiplatelets: Secondary | ICD-10-CM | POA: Diagnosis not present

## 2015-05-31 DIAGNOSIS — J449 Chronic obstructive pulmonary disease, unspecified: Secondary | ICD-10-CM | POA: Diagnosis present

## 2015-05-31 DIAGNOSIS — I2510 Atherosclerotic heart disease of native coronary artery without angina pectoris: Secondary | ICD-10-CM | POA: Diagnosis not present

## 2015-05-31 DIAGNOSIS — Z79899 Other long term (current) drug therapy: Secondary | ICD-10-CM | POA: Diagnosis not present

## 2015-05-31 DIAGNOSIS — T464X5A Adverse effect of angiotensin-converting-enzyme inhibitors, initial encounter: Secondary | ICD-10-CM | POA: Diagnosis not present

## 2015-05-31 DIAGNOSIS — Z96642 Presence of left artificial hip joint: Secondary | ICD-10-CM | POA: Diagnosis present

## 2015-05-31 DIAGNOSIS — Z7982 Long term (current) use of aspirin: Secondary | ICD-10-CM | POA: Diagnosis not present

## 2015-05-31 DIAGNOSIS — R220 Localized swelling, mass and lump, head: Secondary | ICD-10-CM | POA: Diagnosis not present

## 2015-05-31 DIAGNOSIS — M1990 Unspecified osteoarthritis, unspecified site: Secondary | ICD-10-CM | POA: Diagnosis present

## 2015-05-31 DIAGNOSIS — I739 Peripheral vascular disease, unspecified: Secondary | ICD-10-CM | POA: Diagnosis present

## 2015-05-31 DIAGNOSIS — Z955 Presence of coronary angioplasty implant and graft: Secondary | ICD-10-CM | POA: Diagnosis not present

## 2015-05-31 DIAGNOSIS — T783XXA Angioneurotic edema, initial encounter: Secondary | ICD-10-CM | POA: Diagnosis present

## 2015-05-31 DIAGNOSIS — E785 Hyperlipidemia, unspecified: Secondary | ICD-10-CM | POA: Diagnosis present

## 2015-05-31 DIAGNOSIS — I252 Old myocardial infarction: Secondary | ICD-10-CM | POA: Diagnosis not present

## 2015-05-31 DIAGNOSIS — Y929 Unspecified place or not applicable: Secondary | ICD-10-CM | POA: Diagnosis not present

## 2015-05-31 LAB — CBC
HEMATOCRIT: 40.5 % (ref 36.0–46.0)
Hemoglobin: 12.9 g/dL (ref 12.0–15.0)
MCH: 26.7 pg (ref 26.0–34.0)
MCHC: 31.9 g/dL (ref 30.0–36.0)
MCV: 83.9 fL (ref 78.0–100.0)
PLATELETS: 395 10*3/uL (ref 150–400)
RBC: 4.83 MIL/uL (ref 3.87–5.11)
RDW: 17.7 % — ABNORMAL HIGH (ref 11.5–15.5)
WBC: 5.5 10*3/uL (ref 4.0–10.5)

## 2015-05-31 LAB — COMPREHENSIVE METABOLIC PANEL
ALT: 15 U/L (ref 14–54)
ANION GAP: 10 (ref 5–15)
AST: 20 U/L (ref 15–41)
Albumin: 3.6 g/dL (ref 3.5–5.0)
Alkaline Phosphatase: 79 U/L (ref 38–126)
BILIRUBIN TOTAL: 0.4 mg/dL (ref 0.3–1.2)
BUN: 19 mg/dL (ref 6–20)
CHLORIDE: 106 mmol/L (ref 101–111)
CO2: 23 mmol/L (ref 22–32)
CREATININE: 0.71 mg/dL (ref 0.44–1.00)
Calcium: 9 mg/dL (ref 8.9–10.3)
Glucose, Bld: 151 mg/dL — ABNORMAL HIGH (ref 65–99)
POTASSIUM: 4 mmol/L (ref 3.5–5.1)
Sodium: 139 mmol/L (ref 135–145)
Total Protein: 7.1 g/dL (ref 6.5–8.1)

## 2015-05-31 LAB — PHOSPHORUS: Phosphorus: 3.4 mg/dL (ref 2.5–4.6)

## 2015-05-31 LAB — TSH: TSH: 0.283 u[IU]/mL — ABNORMAL LOW (ref 0.350–4.500)

## 2015-05-31 LAB — MAGNESIUM: MAGNESIUM: 2 mg/dL (ref 1.7–2.4)

## 2015-05-31 MED ORDER — DIPHENHYDRAMINE HCL 50 MG/ML IJ SOLN
12.5000 mg | Freq: Four times a day (QID) | INTRAMUSCULAR | Status: DC
  Administered 2015-05-31: 12.5 mg via INTRAVENOUS
  Filled 2015-05-31: qty 1

## 2015-05-31 MED ORDER — ALBUTEROL SULFATE (2.5 MG/3ML) 0.083% IN NEBU: 2.5000 mg | INHALATION_SOLUTION | RESPIRATORY_TRACT | Status: DC | PRN

## 2015-05-31 MED ORDER — HYDROCHLOROTHIAZIDE 25 MG PO TABS: 25.0000 mg | ORAL_TABLET | Freq: Every day | ORAL | Status: DC

## 2015-05-31 MED ORDER — SODIUM CHLORIDE 0.9 % IV SOLN
INTRAVENOUS | Status: DC
  Administered 2015-05-31: 04:00:00 via INTRAVENOUS

## 2015-05-31 MED ORDER — ALBUTEROL SULFATE HFA 108 (90 BASE) MCG/ACT IN AERS: 1.0000 | INHALATION_SPRAY | Freq: Four times a day (QID) | RESPIRATORY_TRACT | Status: DC | PRN

## 2015-05-31 MED ORDER — HYDRALAZINE HCL 20 MG/ML IJ SOLN: 10.0000 mg | INTRAMUSCULAR | Status: DC | PRN

## 2015-05-31 MED ORDER — SODIUM CHLORIDE 0.9 % IJ SOLN
3.0000 mL | Freq: Two times a day (BID) | INTRAMUSCULAR | Status: DC
  Administered 2015-05-31: 3 mL via INTRAVENOUS

## 2015-05-31 MED ORDER — METHIMAZOLE 5 MG PO TABS: 5.0000 mg | ORAL_TABLET | Freq: Every day | ORAL | Status: DC

## 2015-05-31 MED ORDER — ACETAMINOPHEN 325 MG PO TABS: 650.0000 mg | ORAL_TABLET | Freq: Four times a day (QID) | ORAL | Status: DC | PRN

## 2015-05-31 MED ORDER — PANTOPRAZOLE SODIUM 40 MG IV SOLR
40.0000 mg | INTRAVENOUS | Status: DC
  Administered 2015-05-31: 40 mg via INTRAVENOUS
  Filled 2015-05-31: qty 40

## 2015-05-31 MED ORDER — FAMOTIDINE IN NACL 20-0.9 MG/50ML-% IV SOLN: 20.0000 mg | Freq: Two times a day (BID) | INTRAVENOUS | Status: DC

## 2015-05-31 MED ORDER — DEXAMETHASONE SODIUM PHOSPHATE 4 MG/ML IJ SOLN
4.0000 mg | Freq: Four times a day (QID) | INTRAMUSCULAR | Status: DC
Start: 2015-05-31 — End: 2015-05-31
  Administered 2015-05-31: 4 mg via INTRAVENOUS
  Filled 2015-05-31: qty 1

## 2015-05-31 MED ORDER — ACETAMINOPHEN 650 MG RE SUPP: 650.0000 mg | Freq: Four times a day (QID) | RECTAL | Status: DC | PRN

## 2015-05-31 NOTE — Telephone Encounter (Signed)
Patient called stating that she was in the ER last night with her tongue swelling and the MD there told her to stop her lisinopril-HCTZ  And she wants to know what she need to take to replace the lisinopril.please call her at home

## 2015-05-31 NOTE — Discharge Instructions (Signed)
Angioedema °Angioedema is a sudden swelling of tissues, often of the skin. It can occur on the face or genitals or in the abdomen or other body parts. The swelling usually develops over a short period and gets better in 24 to 48 hours. It often begins during the night and is found when the person wakes up. The person may also get red, itchy patches of skin (hives). Angioedema can be dangerous if it involves swelling of the air passages.  °Depending on the cause, episodes of angioedema may only happen once, come back in unpredictable patterns, or repeat for several years and then gradually fade away.  °CAUSES  °Angioedema can be caused by an allergic reaction to various triggers. It can also result from nonallergic causes, including reactions to drugs, immune system disorders, viral infections, or an abnormal gene that is passed to you from your parents (hereditary). For some people with angioedema, the cause is unknown.  °Some things that can trigger angioedema include:  °· Foods.   °· Medicines, such as ACE inhibitors, ARBs, nonsteroidal anti-inflammatory agents, or estrogen.   °· Latex.   °· Animal saliva.   °· Insect stings.   °· Dyes used in X-rays.   °· Mild injury.   °· Dental work. °· Surgery. °· Stress.   °· Sudden changes in temperature.   °· Exercise. °SIGNS AND SYMPTOMS  °· Swelling of the skin. °· Hives. If these are present, there is also intense itching. °· Redness in the affected area.   °· Pain in the affected area. °· Swollen lips or tongue. °· Breathing problems. This may happen if the air passages swell. °· Wheezing. °If internal organs are involved, there may be:  °· Nausea.   °· Abdominal pain.   °· Vomiting.   °· Difficulty swallowing.   °· Difficulty passing urine. °DIAGNOSIS  °· Your health care provider will examine the affected area and take a medical and family history. °· Various tests may be done to help determine the cause. Tests may include: °¨ Allergy skin tests to see if the problem  is an allergic reaction.   °¨ Blood tests to check for hereditary angioedema.   °¨ Tests to check for underlying diseases that could cause the condition.   °· A review of your medicines, including over-the-counter medicines, may be done. °TREATMENT  °Treatment will depend on the cause of the angioedema. Possible treatments include:  °· Removal of anything that triggered the condition (such as stopping certain medicines).   °· Medicines to treat symptoms or prevent attacks. Medicines given may include:   °¨ Antihistamines.   °¨ Epinephrine injection.   °¨ Steroids.   °· Hospitalization may be required for severe attacks. If the air passages are affected, it can be an emergency. Tubes may need to be placed to keep the airway open. °HOME CARE INSTRUCTIONS  °· Take all medicines as directed by your health care provider. °· If you were given medicines for emergency allergy treatment, always carry them with you. °· Wear a medical bracelet as directed by your health care provider.   °· Avoid known triggers. °SEEK MEDICAL CARE IF:  °· You have repeat attacks of angioedema.   °· Your attacks are more frequent or more severe despite preventive measures.   °· You have hereditary angioedema and are considering having children. It is important to discuss with your health care provider the risks of passing the condition on to your children. °SEEK IMMEDIATE MEDICAL CARE IF:  °· You have severe swelling of the mouth, tongue, or lips. °· You have difficulty breathing.   °· You have difficulty swallowing.   °· You faint. °MAKE   SURE YOU: °· Understand these instructions. °· Will watch your condition. °· Will get help right away if you are not doing well or get worse. °Document Released: 02/02/2002 Document Revised: 04/10/2014 Document Reviewed: 07/18/2013 °ExitCare® Patient Information ©2015 [employer]. This information is not intended to replace advice given to you by your health care provider. Make sure you discuss any questions  you have with your health care provider. ° °

## 2015-05-31 NOTE — Telephone Encounter (Signed)
Spoke with pt and gave her instructions from Dr. Clifton James. She will monitor blood pressure twice daily and let us know if it becomes elevated.  Will send prescription to [facility] on [address] I also called pharmacy and made them aware of pt's allergic reaction to ace inhibitors and asked them to discontinue lisinopril-HCTZ from profile.

## 2015-05-31 NOTE — ED Provider Notes (Signed)
Assumed care of pt at shift change.  Informed by Dr. Suszanne Conners that pt is improving.  I examined her and based on the notes, I agree.  Floor of mouth is soft.  Pt states she feels comfortable returning home, and feels that she has improved.  DC home in stable condition with standard return precautions.  1. ACE inhibitor-aggravated angioedema, initial encounter      Kristine Mo, MD 05/31/15 [phone]

## 2015-05-31 NOTE — Telephone Encounter (Signed)
See other note that addresses this

## 2015-05-31 NOTE — Telephone Encounter (Signed)
I would resume HCTZ 25 mg daily and not restart Lisinopril. Follow BP at home and let us know if elevated. cdm

## 2015-05-31 NOTE — Telephone Encounter (Signed)
Received message from refill team that pt called them to report she was in ER last night with tongue swelling on left side. She was told to stop lisinopril -HCTZ. Pt is asking if there is medication she should take to replace lisinopril.   I spoke with pt who reports she feels much better today. Swelling not completely gone but much improved today.  She is aware not to resume lisinopril/HCTZ.  Will review with Dr. Eulis Foster for recommendations.  Pt also reports she was hospitalized in April for GI bleeding.

## 2015-05-31 NOTE — Consult Note (Signed)
Reason for Consult: Angioedema of the tongue  HPI:  Kristine [name] is an 74 y.o. female who presented to [facility] ER last night  with chief complaint of tongue swelling. She states the symptoms started around 7:30 pm last night. She denies any known contact with allergens. 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 She denies any wheezing. Denies nausea, vomiting, or diarrhea. She takes lisinopril for her hypertension. She was treated with steroid while in the ER.   Past Medical History  Diagnosis Date  . STEMI (ST elevation myocardial infarction) May 2011    Promus DES in the proximal, mid and distal RCA in May 2011  . HTN (hypertension)   . Hyperlipidemia   . PVD (peripheral vascular disease)     Lower extremity Dopplers  May 2revealed an ABI of 0.83 in the right posterior   . COPD (chronic obstructive pulmonary disease)   . Hyperthyroidism     treated wtih radioactive iodine  . OA (osteoarthritis of spine)   . Stroke     Past Surgical History  Procedure Laterality Date  . Total hip arthroplasty  2004    left  . Abdominal hysterectomy      Had ovarian resection and required lysis of adhesions  . Cardiac catheterization  04/07/2010    EF 60-70%  . Coronary stents    . Esophagogastroduodenoscopy N/A 03/10/2015    Procedure: ESOPHAGOGASTRODUODENOSCOPY (EGD);  Surgeon: Acquanetta Sit, MD;  Location: [place];  Service: Endoscopy;  Laterality: N/A;    Family History  Problem Relation Age of Onset  . Heart attack Father   . Peripheral vascular disease Mother     Social History:  reports that she quit smoking about 5 years ago. Her smoking use included Cigarettes. She has a 45 pack-year smoking history. She does not have any smokeless tobacco history on file. She reports that she does not drink alcohol or use illicit drugs.  Allergies:  Allergies  Allergen Reactions  . Iodinated Diagnostic Agents Anaphylaxis  .  Shellfish-Derived Products Anaphylaxis  . Ace Inhibitors Swelling    Angioedema of the tongue  . Hydrocodone Other (See Comments)    unknown  . Other Other (See Comments)    Allergic to melons.     Prior to Admission medications   Medication Sig Start Date End Date Taking? Authorizing Provider  aspirin 81 MG tablet Take 81 mg by mouth daily.     Yes Historical Provider, MD  atorvastatin (LIPITOR) 20 MG tablet Take 1 tablet (20 mg total) by mouth daily. 02/08/15  Yes Burnell Blanks, MD  hydrALAZINE (APRESOLINE) 25 MG tablet Take 25 mg by mouth every other day.    Yes Historical Provider, MD  lisinopril-hydrochlorothiazide (PRINZIDE,ZESTORETIC) 20-25 MG per tablet Take 1 tablet by mouth daily. 02/08/15  Yes Burnell Blanks, MD  methimazole (TAPAZOLE) 5 MG tablet Take 5 mg by mouth daily.   Yes Historical Provider, MD  metoprolol succinate (TOPROL-XL) 50 MG 24 hr tablet Take 1 tablet (50 mg total) by mouth daily. Take with or immediately following a meal. 02/08/15  Yes Burnell Blanks, MD  nitroGLYCERIN (NITROSTAT) 0.4 MG SL tablet Place 1 tablet (0.4 mg total) under the tongue every 5 (five) minutes as needed for chest pain. 01/04/15  Yes Burnell Blanks, MD  pantoprazole (PROTONIX) 40 MG tablet Take 1 tablet (40 mg total) by mouth 2 (two) times daily. 03/12/15  Yes Corinna  Greggory Keen, MD  PROAIR HFA 108 (90 BASE) MCG/ACT inhaler Inhale 1 puff into the lungs every 6 (six) hours as needed for wheezing or shortness of breath.  10/31/13  Yes Historical Provider, MD  Tavaborole (KERYDIN) 5 % SOLN Apply 1 drop to each affected toenail once daily for 12 months 02/07/14  Yes Harriet Masson, DPM    Results for orders placed or performed during the hospital encounter of 05/30/15 (from the past 48 hour(s))  CBC with Differential/Platelet     Status: Abnormal   Collection Time: 05/30/15 10:37 PM  Result Value Ref Range   WBC 6.1 4.0 - 10.5 K/uL   RBC 4.73 3.87 - 5.11 MIL/uL    Hemoglobin 12.7 12.0 - 15.0 g/dL   HCT 39.7 36.0 - 46.0 %   MCV 83.9 78.0 - 100.0 fL   MCH 26.8 26.0 - 34.0 pg   MCHC 32.0 30.0 - 36.0 g/dL   RDW 17.7 (H) 11.5 - 15.5 %   Platelets 396 150 - 400 K/uL   Neutrophils Relative % 54 43 - 77 %   Neutro Abs 3.3 1.7 - 7.7 K/uL   Lymphocytes Relative 33 12 - 46 %   Lymphs Abs 2.0 0.7 - 4.0 K/uL   Monocytes Relative 7 3 - 12 %   Monocytes Absolute 0.4 0.1 - 1.0 K/uL   Eosinophils Relative 5 0 - 5 %   Eosinophils Absolute 0.3 0.0 - 0.7 K/uL   Basophils Relative 1 0 - 1 %   Basophils Absolute 0.1 0.0 - 0.1 K/uL  Basic metabolic panel     Status: Abnormal   Collection Time: 05/30/15 10:37 PM  Result Value Ref Range   Sodium 137 135 - 145 mmol/L   Potassium 3.7 3.5 - 5.1 mmol/L   Chloride 105 101 - 111 mmol/L   CO2 26 22 - 32 mmol/L   Glucose, Bld 100 (H) 65 - 99 mg/dL   BUN 22 (H) 6 - 20 mg/dL   Creatinine, Ser 0.98 0.44 - 1.00 mg/dL   Calcium 9.3 8.9 - 10.3 mg/dL   GFR calc non Af Amer 57 (L) >60 mL/min   GFR calc Af Amer >60 >60 mL/min    Comment: (NOTE) The eGFR has been calculated using the CKD EPI equation. This calculation has not been validated in all clinical situations. eGFR's persistently <60 mL/min signify possible Chronic Kidney Disease.    Anion gap 6 5 - 15  Magnesium     Status: None   Collection Time: 05/31/15  5:06 AM  Result Value Ref Range   Magnesium 2.0 1.7 - 2.4 mg/dL  Phosphorus     Status: None   Collection Time: 05/31/15  5:06 AM  Result Value Ref Range   Phosphorus 3.4 2.5 - 4.6 mg/dL  Comprehensive metabolic panel     Status: Abnormal   Collection Time: 05/31/15  5:06 AM  Result Value Ref Range   Sodium 139 135 - 145 mmol/L   Potassium 4.0 3.5 - 5.1 mmol/L   Chloride 106 101 - 111 mmol/L   CO2 23 22 - 32 mmol/L   Glucose, Bld 151 (H) 65 - 99 mg/dL   BUN 19 6 - 20 mg/dL   Creatinine, Ser 0.71 0.44 - 1.00 mg/dL   Calcium 9.0 8.9 - 10.3 mg/dL   Total Protein 7.1 6.5 - 8.1 g/dL   Albumin 3.6 3.5 -  5.0 g/dL   AST 20 15 - 41 U/L   ALT 15 14 -  54 U/L   Alkaline Phosphatase 79 38 - 126 U/L   Total Bilirubin 0.4 0.3 - 1.2 mg/dL   GFR calc non Af Amer >60 >60 mL/min   GFR calc Af Amer >60 >60 mL/min    Comment: (NOTE) The eGFR has been calculated using the CKD EPI equation. This calculation has not been validated in all clinical situations. eGFR's persistently <60 mL/min signify possible Chronic Kidney Disease.    Anion gap 10 5 - 15  CBC     Status: Abnormal   Collection Time: 05/31/15  5:06 AM  Result Value Ref Range   WBC 5.5 4.0 - 10.5 K/uL   RBC 4.83 3.87 - 5.11 MIL/uL   Hemoglobin 12.9 12.0 - 15.0 g/dL   HCT 40.5 36.0 - 46.0 %   MCV 83.9 78.0 - 100.0 fL   MCH 26.7 26.0 - 34.0 pg   MCHC 31.9 30.0 - 36.0 g/dL   RDW 17.7 (H) 11.5 - 15.5 %   Platelets 395 150 - 400 K/uL  TSH     Status: Abnormal   Collection Time: 05/31/15  5:07 AM  Result Value Ref Range   TSH 0.283 (L) 0.350 - 4.500 uIU/mL    No results found.  Review of Systems  Constitutional: Negative for fever and chills.  HENT: Tongue swelling  Respiratory: Negative for shortness of breath.  Cardiovascular: Negative for chest pain.  Gastrointestinal: Negative for nausea, vomiting, diarrhea and constipation.  Genitourinary: Negative for dysuria.  All other systems reviewed and are negative.  Blood pressure 121/72, pulse 60, temperature 97.6 F (36.4 C), temperature source Oral, resp. rate 10, SpO2 98 %.  Physical Exam  Constitutional: She is oriented to person, place, and time. She appears well-developed and well-nourished.  Head: Normocephalic and atraumatic.  Mild left-sided tongue swelling, no lip swelling, no peritonsillar or tonsillar abscess, airway is intact, no stridor  Eyes: Conjunctivae and EOM are normal. Pupils are equal, round, and reactive to light.  Neck: Normal range of motion. Neck supple.  Cardiovascular: Normal rate and regular rhythm.  Pulmonary/Chest: Effort normal and breath sounds  normal. No respiratory distress. She has no wheezes. She has no rales. She exhibits no tenderness.  Lungs are clear to auscultation bilaterally  Musculoskeletal: Normal range of motion. She exhibits no edema or tenderness.  Neurological: She is alert and oriented to person, place, and time.  Skin: Skin is warm and dry. No rash  Psychiatric: She has a normal mood and affect. Her behavior is normal. Judgment and thought content normal.   Assessment/Plan: Left tongue angioedema, significantly improved after treatment with IV steroid. Pt should withhold her lisinopril. Pt will follow up with me as an outpatient in 1 week.  Deneka Greenwalt,SUI W 05/31/2015, 8:39 AM

## 2015-05-31 NOTE — H&P (Signed)
PCP:  Burtis Junes, MD  Cardiology [facility] Referring provider Rob PA   Chief Complaint: Left side of tongue and and tonsil swelling  HPI: Kristine [name] is a 74 y.o. female   has a past medical history of STEMI (ST elevation myocardial infarction) (May 2011); HTN (hypertension); Hyperlipidemia; PVD (peripheral vascular disease); COPD (chronic obstructive pulmonary disease); Hyperthyroidism; OA (osteoarthritis of spine); and Stroke.   Presented with at 7:30 PM today patient noted to have swelling of the left side of her tongue as well as tonsils.  Patient was tasting chicken and dumplings when she noted that.  Of note patient has history of hypertension and on lisinopril. Patient did not endorse any shortness of breath or difficulty breathing. 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 She reports eating some desert yesterday that she wasn't sure of. But usually her symptoms developed much sooner. Patient did not endorse any nausea vomiting or diarrhea no chest pains no shortness of breath. No Wheezing On presentation to emerge department patient was noted to have stable vital signs. She was administered Benadryl 25 mg IV Pepcid 20 mg IV and Solu-Medrol 125. She was observed for the next 2 hours it was noted that her swelling did not improve and if anything got slightly worse. At which point a consult was placed to ENT Dr. Suszanne Conners who recommended starting patient on Decadron and admitted to stepdown for observation with ENT consult in the morning.  Of note patient has known history of coronary artery disease status post PCI in 2011 as well as peripheral vascular disease. Her recent history significant for admission in April 2016 with melanotic a 2 units of packed red blood cells EKG is showing no source of bleeding. At that time her Plavix was discontinued.  Hospitalist was called for  admission for angioedema  Review of Systems:    Pertinent positives include: Swelling of the tongue  Constitutional:  No weight loss, night sweats, Fevers, chills, fatigue, weight loss  HEENT:  No headaches, Difficulty swallowing,Tooth/dental problems,Sore throat,  No sneezing, itching, ear ache, nasal congestion, post nasal drip,  Cardio-vascular:  No chest pain, Orthopnea, PND, anasarca, dizziness, palpitations.no Bilateral lower extremity swelling  GI:  No heartburn, indigestion, abdominal pain, nausea, vomiting, diarrhea, change in bowel habits, loss of appetite, melena, blood in stool, hematemesis Resp:  no shortness of breath at rest. No dyspnea on exertion, No excess mucus, no productive cough, No non-productive cough, No coughing up of blood.No change in color of mucus.No wheezing. Skin:  no rash or lesions. No jaundice GU:  no dysuria, change in color of urine, no urgency or frequency. No straining to urinate.  No flank pain.  Musculoskeletal:  No joint pain or no joint swelling. No decreased range of motion. No back pain.  Psych:  No change in mood or affect. No depression or anxiety. No memory loss.  Neuro: no localizing neurological complaints, no tingling, no weakness, no double vision, no gait abnormality, no slurred speech, no confusion  Otherwise ROS are negative except for above, 10 systems were reviewed  Past Medical History: Past Medical History  Diagnosis Date  . STEMI (ST elevation myocardial infarction) May 2011    Promus DES in the proximal, mid and distal RCA in May 2011  . HTN (hypertension)   . Hyperlipidemia   . PVD (peripheral vascular disease)     Lower extremity Dopplers  May  2revealed an ABI of 0.83 in the right posterior   . COPD (chronic obstructive pulmonary disease)   . Hyperthyroidism     treated wtih radioactive iodine  . OA (osteoarthritis of spine)   . Stroke    Past Surgical History  Procedure Laterality Date  . Total hip  arthroplasty  2004    left  . Abdominal hysterectomy      Had ovarian resection and required lysis of adhesions  . Cardiac catheterization  04/07/2010    EF 60-70%  . Coronary stents    . Esophagogastroduodenoscopy N/A 03/10/2015    Procedure: ESOPHAGOGASTRODUODENOSCOPY (EGD);  Surgeon: Wandalee Ferdinand, MD;  Location[facility] ENDOSCOPY;  Service: Endoscopy;  Laterality: N/A;     Medications: Prior to Admission medications   Medication Sig Start Date End Date Taking? Authorizing Provider  aspirin 81 MG tablet Take 81 mg by mouth daily.     Yes Historical Provider, MD  atorvastatin (LIPITOR) 20 MG tablet Take 1 tablet (20 mg total) by mouth daily. 02/08/15  Yes Kathleene Hazel, MD  clopidogrel (PLAVIX) 75 MG tablet Take 75 mg by mouth daily.  03/07/15  Yes Historical Provider, MD  hydrALAZINE (APRESOLINE) 25 MG tablet Take 25 mg by mouth every other day.    Yes Historical Provider, MD  lisinopril-hydrochlorothiazide (PRINZIDE,ZESTORETIC) 20-25 MG per tablet Take 1 tablet by mouth daily. 02/08/15  Yes Kathleene Hazel, MD  methimazole (TAPAZOLE) 5 MG tablet Take 5 mg by mouth daily.   Yes Historical Provider, MD  metoprolol succinate (TOPROL-XL) 50 MG 24 hr tablet Take 1 tablet (50 mg total) by mouth daily. Take with or immediately following a meal. 02/08/15  Yes Kathleene Hazel, MD  nitroGLYCERIN (NITROSTAT) 0.4 MG SL tablet Place 1 tablet (0.4 mg total) under the tongue every 5 (five) minutes as needed for chest pain. 01/04/15  Yes Kathleene Hazel, MD  pantoprazole (PROTONIX) 40 MG tablet Take 1 tablet (40 mg total) by mouth 2 (two) times daily. 03/12/15  Yes Christiane Ha, MD  PROAIR HFA 108 (90 BASE) MCG/ACT inhaler Inhale 1 puff into the lungs every 6 (six) hours as needed for wheezing or shortness of breath.  10/31/13  Yes Historical Provider, MD  Tavaborole (KERYDIN) 5 % SOLN Apply 1 drop to each affected toenail once daily for 12 months 02/07/14  Yes Alvan Dame, DPM     Allergies:   Allergies  Allergen Reactions  . Iodinated Diagnostic Agents Anaphylaxis  . Shellfish-Derived Products Anaphylaxis  . Ace Inhibitors Swelling    Angioedema of the tongue  . Hydrocodone Other (See Comments)    unknown  . Other Other (See Comments)    Allergic to melons.     Social History:  Ambulatory   independently   Lives at home With family     reports that she quit smoking about 5 years ago. Her smoking use included Cigarettes. She has a 45 pack-year smoking history. She does not have any smokeless tobacco history on file. She reports that she does not drink alcohol or use illicit drugs.    Family History: family history includes Heart attack in her father; Peripheral vascular disease in her mother.    Physical Exam: Patient Vitals for the past 24 hrs:  BP Temp Temp src Pulse Resp SpO2  05/30/15 2300 124/72 mmHg - - 63 - 97 %  05/30/15 2246 122/73 mmHg - - 65 16 98 %  05/30/15 2203 112/77 mmHg - - (!) 57 16 98 %  05/30/15 2125 122/78 mmHg 97.6 F (36.4 C) Oral 66 18 100 %    1. General:  in No Acute distress 2. Psychological: Alert and  Oriented 3. Head/ENT:   Moist   Mucous Membranes, tongue swelling noted no drooling                          Head Non traumatic, neck supple                          Normal   Dentition 4. SKIN: normal Skin turgor,  Skin clean Dry and intact no rash 5. Heart: Regular rate and rhythm no Murmur, Rub or gallop 6. Lungs: Clear to auscultation bilaterally, no wheezes or crackles   7. Abdomen: Soft, non-tender, Non distended 8. Lower extremities: no clubbing, cyanosis, or edema 9. Neurologically Grossly intact, moving all 4 extremities equally 10. MSK: Normal range of motion  body mass index is unknown because there is no weight on file.   Labs on Admission:   Results for orders placed or performed during the hospital encounter of 05/30/15 (from the past 24 hour(s))  CBC with Differential/Platelet     Status:  Abnormal   Collection Time: 05/30/15 10:37 PM  Result Value Ref Range   WBC 6.1 4.0 - 10.5 K/uL   RBC 4.73 3.87 - 5.11 MIL/uL   Hemoglobin 12.7 12.0 - 15.0 g/dL   HCT 29.5 62.1 - 30.8 %   MCV 83.9 78.0 - 100.0 fL   MCH 26.8 26.0 - 34.0 pg   MCHC 32.0 30.0 - 36.0 g/dL   RDW 65.7 (H) 84.6 - 96.2 %   Platelets 396 150 - 400 K/uL   Neutrophils Relative % 54 43 - 77 %   Neutro Abs 3.3 1.7 - 7.7 K/uL   Lymphocytes Relative 33 12 - 46 %   Lymphs Abs 2.0 0.7 - 4.0 K/uL   Monocytes Relative 7 3 - 12 %   Monocytes Absolute 0.4 0.1 - 1.0 K/uL   Eosinophils Relative 5 0 - 5 %   Eosinophils Absolute 0.3 0.0 - 0.7 K/uL   Basophils Relative 1 0 - 1 %   Basophils Absolute 0.1 0.0 - 0.1 K/uL  Basic metabolic panel     Status: Abnormal   Collection Time: 05/30/15 10:37 PM  Result Value Ref Range   Sodium 137 135 - 145 mmol/L   Potassium 3.7 3.5 - 5.1 mmol/L   Chloride 105 101 - 111 mmol/L   CO2 26 22 - 32 mmol/L   Glucose, Bld 100 (H) 65 - 99 mg/dL   BUN 22 (H) 6 - 20 mg/dL   Creatinine, Ser 9.52 0.44 - 1.00 mg/dL   Calcium 9.3 8.9 - 84.1 mg/dL   GFR calc non Af Amer 57 (L) >60 mL/min   GFR calc Af Amer >60 >60 mL/min   Anion gap 6 5 - 15    UA not obtained  Lab Results  Component Value Date   HGBA1C  05/30/2010    5.5 (NOTE)  According to the ADA Clinical Practice Recommendations for 2011, when HbA1c is used as a screening test:   >=6.5%   Diagnostic of Diabetes Mellitus           (if abnormal result  is confirmed)  5.7-6.4%   Increased risk of developing Diabetes Mellitus  References:Diagnosis and Classification of Diabetes Mellitus,Diabetes Care,2011,34(Suppl 1):S62-S69 and Standards of Medical Care in         Diabetes - 2011,Diabetes Care,2011,34  (Suppl 1):S11-S61.    CrCl cannot be calculated (Unknown ideal weight.).  BNP (last 3 results) No results for input(s): PROBNP in the last 8760 hours.  Other  results:  I have pearsonaly reviewed this: ECG REPORT not obtained There were no vitals filed for this visit.   Cultures:    Component Value Date/Time   SDES BLOOD LEFT ARM 03/11/2015 1715   SPECREQUEST BOTTLES DRAWN AEROBIC ONLY 5CC 03/11/2015 1715   CULT  03/11/2015 1715    NO GROWTH 5 DAYS Performed at [facility]    REPTSTATUS 03/18/2015 FINAL 03/11/2015 1715     Radiological Exams on Admission: No results found.  Chart has been reviewed  Family not  at  Bedside  plan of care was discussed with  Husband Samanntha Hoggatt [phone]  Assessment/Plan  74 year old female with known history of coronary artery disease, COPD and hypertension presents with angioedema involving left side of the tongue most likely secondary to ACE inhibitor not associated respiratory compromise. Been admitted for further observation and treatment to step down ENT aware we'll seen in the morning   Present on Admission:  . ACE inhibitor-aggravated angioedema - most likely cause  ACE inhibitor. Will discontinue and add to allergy list. Monitor and step down. Continue Benadryl Prilosec IV Decadron IV monitor for any in the evidence of anaphylaxis. Currently hemodynamically stable. ENT aware we'll see her in the morning  . HTN (hypertension) - hold lisinopril. Keep patient nothing by mouth for now monitor and step down and hold home medications. Will give hydralazine when necessary IV  . COPD (chronic obstructive pulmonary disease) - chronic currently stable albuterol as needed  . CAD (coronary artery disease) - currently nothing by mouth. No chest pain no shortness of breath will hold off on aspirin in case needs any procedures. We'll restart home medications 1 patient recovers unable to swallow     Prophylaxis: SCD   CODE STATUS:  FULL CODE   as per patient    Disposition  To home once workup is complete and patient is stable  Other plan as per orders.  I have spent a total of 55 min on  this admission  Sanel Stemmer 05/31/2015, 12:05 AM  [facility]  Pager [phone]   after 2 AM please page floor coverage PA If 7AM-7PM, please contact the day team taking care of the patient  [email or web address]  Password [email or web address]

## 2015-06-26 DIAGNOSIS — M65261 Calcific tendinitis, right lower leg: Secondary | ICD-10-CM | POA: Diagnosis not present

## 2015-06-27 ENCOUNTER — Other Ambulatory Visit: Payer: Self-pay | Admitting: Cardiovascular Disease

## 2015-06-27 DIAGNOSIS — I739 Peripheral vascular disease, unspecified: Secondary | ICD-10-CM

## 2015-06-29 DIAGNOSIS — M25571 Pain in right ankle and joints of right foot: Secondary | ICD-10-CM | POA: Diagnosis not present

## 2015-06-29 DIAGNOSIS — M25552 Pain in left hip: Secondary | ICD-10-CM | POA: Diagnosis not present

## 2015-07-05 DIAGNOSIS — M7661 Achilles tendinitis, right leg: Secondary | ICD-10-CM | POA: Diagnosis not present

## 2015-07-05 DIAGNOSIS — M6281 Muscle weakness (generalized): Secondary | ICD-10-CM | POA: Diagnosis not present

## 2015-07-05 DIAGNOSIS — M25552 Pain in left hip: Secondary | ICD-10-CM | POA: Diagnosis not present

## 2015-07-05 DIAGNOSIS — R269 Unspecified abnormalities of gait and mobility: Secondary | ICD-10-CM | POA: Diagnosis not present

## 2015-07-05 DIAGNOSIS — M25611 Stiffness of right shoulder, not elsewhere classified: Secondary | ICD-10-CM | POA: Diagnosis not present

## 2015-07-09 ENCOUNTER — Ambulatory Visit (HOSPITAL_COMMUNITY)
Admission: RE | Admit: 2015-07-09 | Discharge: 2015-07-09 | Disposition: A | Discharge status: Home / Self Care | Payer: Medicare Other | Source: Ambulatory Visit | Attending: Cardiology | Admitting: Cardiology

## 2015-07-09 ENCOUNTER — Ambulatory Visit (INDEPENDENT_AMBULATORY_CARE_PROVIDER_SITE_OTHER): Payer: Medicare Other | Admitting: Cardiovascular Disease

## 2015-07-09 ENCOUNTER — Telehealth: Payer: Self-pay | Admitting: *Deleted

## 2015-07-09 ENCOUNTER — Encounter (HOSPITAL_COMMUNITY): Payer: Medicare Other

## 2015-07-09 ENCOUNTER — Encounter: Payer: Self-pay | Admitting: Cardiovascular Disease

## 2015-07-09 VITALS — BP 120/70 | HR 74 | Ht 61.0 in | Wt 129.0 lb

## 2015-07-09 DIAGNOSIS — I2510 Atherosclerotic heart disease of native coronary artery without angina pectoris: Secondary | ICD-10-CM | POA: Diagnosis not present

## 2015-07-09 DIAGNOSIS — I739 Peripheral vascular disease, unspecified: Secondary | ICD-10-CM

## 2015-07-09 MED ORDER — HYDRALAZINE HCL 25 MG PO TABS: 25.0000 mg | ORAL_TABLET | Freq: Every day | ORAL | Status: DC

## 2015-07-09 NOTE — Patient Instructions (Signed)
Medication Instructions:  Your physician recommends that you continue on your current medications as directed. Please refer to the Current Medication list given to you today.   Labwork: none  Testing/Procedures: none  Follow-Up: Your physician wants you to follow-up in:  12 months.  You will receive a reminder letter in the mail two months in advance. If you don't receive a letter, please call our office to schedule the follow-up appointment.        

## 2015-07-09 NOTE — Progress Notes (Signed)
Chief Complaint  Patient presents with  . Follow-up      History of Present Illness: 74 yo female with history of CAD s/p inferior STEMI May 2011 treated with 3 DES in the proximal, mid and distal RCA, HTN, HLD, PAD, COPD here today for cardiac follow up. She has been followed in the past by Dr. Deborah Chalk. She is known to have anomalous takeoff of all coronaries. She had abnormal LE arterial dopplers in May 2011 and was seen by Dr. Betti Cruz in [facility] in August 2011. Lower extremity arterial dopplers today showed ABI 0.73 on the right and 0.94 on the left. Conservative management has been pursued with her LE arterial disease. Stress myoview 10/05/13 with no ischemia. She was admitted in April [facility] to [facility] with GI bleeding.   She is here today for cardiac and PV follow up. She denies any chest pain or SOB. She has no pain in her legs. No rest pain or ulcerations. She feels well.   Primary Care Physician: Dr. Amalia Hailey    Past Medical History  Diagnosis Date  . STEMI (ST elevation myocardial infarction) May 2011    Promus DES in the proximal, mid and distal RCA in May 2011  . HTN (hypertension)   . Hyperlipidemia   . PVD (peripheral vascular disease)     Lower extremity Dopplers  May 2revealed an ABI of 0.83 in the right posterior   . COPD (chronic obstructive pulmonary disease)   . Hyperthyroidism     treated wtih radioactive iodine  . OA (osteoarthritis of spine)   . Stroke     Past Surgical History  Procedure Laterality Date  . Total hip arthroplasty  2004    left  . Abdominal hysterectomy      Had ovarian resection and required lysis of adhesions  . Cardiac catheterization  04/07/2010    EF 60-70%  . Coronary stents    . Esophagogastroduodenoscopy N/A 03/10/2015    Procedure: ESOPHAGOGASTRODUODENOSCOPY (EGD);  Surgeon: Wandalee Ferdinand, MD;  Location: [facility] ENDOSCOPY;  Service: Endoscopy;  Laterality: N/A;    Current Outpatient Prescriptions  Medication Sig Dispense Refill  . aspirin  81 MG tablet Take 81 mg by mouth daily.      Marland Kitchen atorvastatin (LIPITOR) 20 MG tablet Take 1 tablet (20 mg total) by mouth daily. 30 tablet 6  . hydrALAZINE (APRESOLINE) 25 MG tablet Take 1 tablet (25 mg total) by mouth daily. 30 tablet 11  . hydrochlorothiazide (HYDRODIURIL) 25 MG tablet Take 1 tablet (25 mg total) by mouth daily. 30 tablet 6  . methimazole (TAPAZOLE) 5 MG tablet Take 5 mg by mouth daily.    . metoprolol succinate (TOPROL-XL) 50 MG 24 hr tablet Take 1 tablet (50 mg total) by mouth daily. Take with or immediately following a meal. 30 tablet 3  . nitroGLYCERIN (NITROSTAT) 0.4 MG SL tablet Place 1 tablet (0.4 mg total) under the tongue every 5 (five) minutes as needed for chest pain. 25 tablet 6  . PROAIR HFA 108 (90 BASE) MCG/ACT inhaler Inhale 1 puff into the lungs every 6 (six) hours as needed for wheezing or shortness of breath.     Laurann Montana (KERYDIN) 5 % SOLN Apply 1 drop to each affected toenail once daily for 12 months 10 mL 11   No current facility-administered medications for this visit.    Allergies  Allergen Reactions  . Iodinated Diagnostic Agents Anaphylaxis  . Shellfish-Derived Products Anaphylaxis  . Ace Inhibitors Swelling  Angioedema of the tongue  . Hydrocodone Other (See Comments)    unknown  . Other Other (See Comments)    Allergic to melons.     History   Social History  . Marital Status: Married    Spouse Name: N/A  . Number of Children: 3  . Years of Education: N/A   Occupational History  . Retired    Social History Main Topics  . Smoking status: Former Smoker -- 1.00 packs/day for 45 years    Types: Cigarettes    Quit date: 04/07/2010  . Smokeless tobacco: Not on file  . Alcohol Use: No  . Drug Use: No  . Sexual Activity: Not on file   Other Topics Concern  . Not on file   Social History Narrative    Family History  Problem Relation Age of Onset  . Heart attack Father   . Peripheral vascular disease Mother     Review  of Systems:  As stated in the HPI and otherwise negative.   BP 120/70 mmHg  Pulse 74  Ht 5\' 1"  (1.549 m)  Wt 129 lb (58.514 kg)  BMI 24.39 kg/m2  SpO2 96%  Physical Examination: General: Well developed, well nourished, NAD HEENT: OP clear, mucus membranes moist SKIN: warm, dry. No rashes. Neuro: No focal deficits Musculoskeletal: Muscle strength 5/5 all ext Psychiatric: Mood and affect normal Neck: No JVD, no carotid bruits, no thyromegaly, no lymphadenopathy. Lungs:Clear bilaterally, no wheezes, rhonci, crackles Cardiovascular: Regular rate and rhythm. No murmurs, gallops or rubs. Abdomen:Soft. Bowel sounds present. Non-tender.  Extremities: No lower extremity edema. Pulses are trace in the bilateral DP/PT.  ABI 07/09/15: Right 0.73. Left 0.94  Stress myoview 10/05/13:  Stress Procedure: The patient received IV Lexiscan 0.4 mg over 15-seconds. Technetium 68m Sestamibi injected at 30-seconds. Quantitative spect images were obtained after a 45 minute delay. Patient complained of a weak feeling, chest tightness and stomach hurting. 75 mg aminophylline was given by Bonnita Levan, RN six minutes into recovery. Symptoms subsided after a few minutes.  Stress ECG: No significant change from baseline ECG  QPS Raw Data Images: Normal; no motion artifact; normal heart/lung ratio. Stress Images: Normal homogeneous uptake in all areas of the myocardium. Rest Images: Normal homogeneous uptake in all areas of the myocardium. Subtraction (SDS): No evidence of ischemia. Transient Ischemic Dilatation (Normal <1.22): 0.97 Lung/Heart Ratio (Normal <0.45): 0.26  Quantitative Gated Spect Images QGS EDV: 55 ml QGS ESV: 16 ml  Impression Exercise Capacity: Lexiscan with no exercise. BP Response: Normal blood pressure response. Clinical Symptoms: Mild chest pain/dyspnea. ECG Impression: No significant ST segment change suggestive of ischemia. Comparison with Prior Nuclear Study:  No previous nuclear study performed  Overall Impression: Normal stress nuclear study.  LV Ejection Fraction: 71%. LV Wall Motion: NL LV Function; NL Wall Motion  EKG:  EKG is not ordered today. The ekg ordered today demonstrates   Recent Labs: 05/31/2015: ALT 15; BUN 19; Creatinine, Ser 0.71; Hemoglobin 12.9; Magnesium 2.0; Platelets 395; Potassium 4.0; Sodium 139; TSH 0.283*   Lipid Panel    Component Value Date/Time   CHOL 154 07/31/2014 1153   TRIG 103.0 07/31/2014 1153   HDL 50.00 07/31/2014 1153   CHOLHDL 3 07/31/2014 1153   VLDL 20.6 07/31/2014 1153   LDLCALC 83 07/31/2014 1153   LDLDIRECT       Wt Readings from Last 3 Encounters:  07/09/15 129 lb (58.514 kg)  03/11/15 131 lb 9.8 oz (59.7 kg)  01/04/15 127 lb (57.607  kg)     Other studies Reviewed: Additional studies/ records that were reviewed today include: . Review of the above records demonstrates:    Assessment and Plan:   1. PAD: Stable. ABI stable today. She has no claudication, rest pain or ulcerations. She no longer smokes. Will encourage ambulation daily. Conservative management for now unless her clinical picture changes.   2. CAD: No recent chest pains. Stress myoview 2014 with no ischemia.  Continue ASA/statin/beta blocker.   Current medicines are reviewed at length with the patient today.  The patient does not have concerns regarding medicines.  The following changes have been made:  no change  Labs/ tests ordered today include:  No orders of the defined types were placed in this encounter.    Disposition:   FU with me in 12  months  Signed, Verne Carrow, MD 07/09/2015 4:54 PM    [facility] [address] [place], [place], [place]  [address] Phone: [phone]; Fax: [phone]

## 2015-07-09 NOTE — Telephone Encounter (Signed)
Pt requesting refills of apresoline. Med list states she is taking this every other day.  I reviewed with Dr. Clifton James and pt should take daily.  Refill sent to [facility] on [address] and pt notified to take daily.  I asked her to monitor blood pressure and let us know if changes.

## 2015-07-10 DIAGNOSIS — M7661 Achilles tendinitis, right leg: Secondary | ICD-10-CM | POA: Diagnosis not present

## 2015-07-10 DIAGNOSIS — R269 Unspecified abnormalities of gait and mobility: Secondary | ICD-10-CM | POA: Diagnosis not present

## 2015-07-10 DIAGNOSIS — M25611 Stiffness of right shoulder, not elsewhere classified: Secondary | ICD-10-CM | POA: Diagnosis not present

## 2015-07-10 DIAGNOSIS — M6281 Muscle weakness (generalized): Secondary | ICD-10-CM | POA: Diagnosis not present

## 2015-07-10 DIAGNOSIS — M25552 Pain in left hip: Secondary | ICD-10-CM | POA: Diagnosis not present

## 2015-07-12 DIAGNOSIS — M6281 Muscle weakness (generalized): Secondary | ICD-10-CM | POA: Diagnosis not present

## 2015-07-12 DIAGNOSIS — R269 Unspecified abnormalities of gait and mobility: Secondary | ICD-10-CM | POA: Diagnosis not present

## 2015-07-12 DIAGNOSIS — M7661 Achilles tendinitis, right leg: Secondary | ICD-10-CM | POA: Diagnosis not present

## 2015-07-12 DIAGNOSIS — M25552 Pain in left hip: Secondary | ICD-10-CM | POA: Diagnosis not present

## 2015-07-17 DIAGNOSIS — M25552 Pain in left hip: Secondary | ICD-10-CM | POA: Diagnosis not present

## 2015-07-17 DIAGNOSIS — M7661 Achilles tendinitis, right leg: Secondary | ICD-10-CM | POA: Diagnosis not present

## 2015-07-17 DIAGNOSIS — R269 Unspecified abnormalities of gait and mobility: Secondary | ICD-10-CM | POA: Diagnosis not present

## 2015-07-17 DIAGNOSIS — M6281 Muscle weakness (generalized): Secondary | ICD-10-CM | POA: Diagnosis not present

## 2015-07-24 DIAGNOSIS — M7661 Achilles tendinitis, right leg: Secondary | ICD-10-CM | POA: Diagnosis not present

## 2015-07-24 DIAGNOSIS — M6281 Muscle weakness (generalized): Secondary | ICD-10-CM | POA: Diagnosis not present

## 2015-07-24 DIAGNOSIS — R269 Unspecified abnormalities of gait and mobility: Secondary | ICD-10-CM | POA: Diagnosis not present

## 2015-07-24 DIAGNOSIS — M25552 Pain in left hip: Secondary | ICD-10-CM | POA: Diagnosis not present

## 2015-07-26 DIAGNOSIS — R269 Unspecified abnormalities of gait and mobility: Secondary | ICD-10-CM | POA: Diagnosis not present

## 2015-07-26 DIAGNOSIS — M25552 Pain in left hip: Secondary | ICD-10-CM | POA: Diagnosis not present

## 2015-07-26 DIAGNOSIS — M7661 Achilles tendinitis, right leg: Secondary | ICD-10-CM | POA: Diagnosis not present

## 2015-07-26 DIAGNOSIS — M6281 Muscle weakness (generalized): Secondary | ICD-10-CM | POA: Diagnosis not present

## 2015-08-15 ENCOUNTER — Other Ambulatory Visit: Payer: Self-pay | Admitting: Cardiovascular Disease

## 2015-08-16 ENCOUNTER — Encounter: Payer: Self-pay | Admitting: Podiatry

## 2015-08-16 ENCOUNTER — Ambulatory Visit (INDEPENDENT_AMBULATORY_CARE_PROVIDER_SITE_OTHER): Payer: Medicare Other | Admitting: Podiatry

## 2015-08-16 DIAGNOSIS — Q828 Other specified congenital malformations of skin: Secondary | ICD-10-CM

## 2015-08-16 DIAGNOSIS — B351 Tinea unguium: Secondary | ICD-10-CM | POA: Diagnosis not present

## 2015-08-16 DIAGNOSIS — M79676 Pain in unspecified toe(s): Secondary | ICD-10-CM

## 2015-08-16 NOTE — Progress Notes (Signed)
Patient ID: Kristine [name], female   DOB: [birth year], 74 y.o.   MRN: [id number] Complaint:  Visit Type: Patient returns to my office for continued preventative foot care services. Complaint: Patient states" my nails have grown long and thick and become painful to walk and wear shoes"  She also has developed callus on both feet which hurt when walking.  She presents for preventative foot care services. No changes to ROS  Podiatric Exam: Vascular: dorsalis pedis and posterior tibial pulses are palpable bilateral. Capillary return is immediate. Temperature gradient is WNL. Skin turgor WNL  Sensorium: Normal Semmes Weinstein monofilament test. Normal tactile sensation bilaterally. Nail Exam: Pt has thick disfigured discolored nails with subungual debris noted bilateral entire nail hallux through fifth toenails Ulcer Exam: There is no evidence of ulcer or pre-ulcerative changes or infection. Orthopedic Exam: Muscle tone and strength are WNL. No limitations in general ROM. No crepitus or effusions noted. Foot type and digits show no abnormalities. Bony prominence unremarkable.. Skin:  Porokeratosis sub 3,4 left foot..  No infection or ulcers  Diagnosis:  Tinea unguium, Pain in right toe, pain in left toes Porokeratosis Left  Treatment & Plan Procedures and Treatment: Consent by patient was obtained for treatment procedures. The patient understood the discussion of treatment and procedures well. All questions were answered thoroughly reviewed. Debridement of mycotic and hypertrophic toenails, 1 through 5 bilateral and clearing of subungual debris. No ulceration, no infection noted. Debridement of porokeratosis  Return Visit-Office Procedure: Patient instructed to return to the office for a follow up visit 3 months for continued evaluation and treatment.

## 2015-09-11 ENCOUNTER — Telehealth: Payer: Self-pay | Admitting: Cardiovascular Disease

## 2015-09-11 DIAGNOSIS — M7061 Trochanteric bursitis, right hip: Secondary | ICD-10-CM | POA: Diagnosis not present

## 2015-09-11 DIAGNOSIS — M19041 Primary osteoarthritis, right hand: Secondary | ICD-10-CM | POA: Diagnosis not present

## 2015-09-11 DIAGNOSIS — M19042 Primary osteoarthritis, left hand: Secondary | ICD-10-CM | POA: Diagnosis not present

## 2015-09-11 NOTE — Telephone Encounter (Signed)
New message      Patient states her orthopedic doctor want her to take aleve bid----is this ok?

## 2015-09-11 NOTE — Telephone Encounter (Signed)
Pt states Dr Eulah Pont was aware she had CAD but told her he thought it would be [facility] for her to take. Pt advised I will forward to Dr Clifton James for review.

## 2015-09-11 NOTE — Telephone Encounter (Signed)
Pt states Dr Eulah Pont, her orthopedic doctor, recommended that she take aleve bid because of joint pain from arthritis.  Pt states he did not want to prescribe stronger pain medication if she could get relief from aleve.

## 2015-09-12 NOTE — Telephone Encounter (Signed)
I would recommend that she try to use Tylenol for pain and use Alleve as needed for breakthrough pain but she may choose to use the Alleve more often if that is the only thing that works for her. Thayer Ohm

## 2015-09-12 NOTE — Telephone Encounter (Signed)
Left message to call back  

## 2015-09-13 NOTE — Telephone Encounter (Signed)
Pt notified of information from Dr. McAlhany 

## 2015-10-02 ENCOUNTER — Other Ambulatory Visit: Payer: Self-pay | Admitting: Cardiovascular Disease

## 2015-10-16 DIAGNOSIS — M1611 Unilateral primary osteoarthritis, right hip: Secondary | ICD-10-CM | POA: Diagnosis not present

## 2015-11-15 ENCOUNTER — Ambulatory Visit (INDEPENDENT_AMBULATORY_CARE_PROVIDER_SITE_OTHER): Payer: Medicare Other | Admitting: Podiatry

## 2015-11-15 ENCOUNTER — Encounter: Payer: Self-pay | Admitting: Podiatry

## 2015-11-15 DIAGNOSIS — B351 Tinea unguium: Secondary | ICD-10-CM | POA: Diagnosis not present

## 2015-11-15 DIAGNOSIS — M79676 Pain in unspecified toe(s): Secondary | ICD-10-CM | POA: Diagnosis not present

## 2015-11-15 DIAGNOSIS — Q828 Other specified congenital malformations of skin: Secondary | ICD-10-CM | POA: Diagnosis not present

## 2015-11-15 NOTE — Progress Notes (Signed)
Patient ID: [name], female   DOB: 1941-08-23, 74 y.o.   MRN: [id number] Complaint:  Visit Type: Patient returns to my office for continued preventative foot care services. Complaint: Patient states" my nails have grown long and thick and become painful to walk and wear shoes"  She also has developed callus on both feet which hurt when walking.  She presents for preventative foot care services. No changes to ROS  Podiatric Exam: Vascular: dorsalis pedis and posterior tibial pulses are palpable bilateral. Capillary return is immediate. Temperature gradient is WNL. Skin turgor WNL  Sensorium: Normal Semmes Weinstein monofilament test. Normal tactile sensation bilaterally. Nail Exam: Pt has thick disfigured discolored nails with subungual debris noted bilateral entire nail hallux through fifth toenails Ulcer Exam: There is no evidence of ulcer or pre-ulcerative changes or infection. Orthopedic Exam: Muscle tone and strength are WNL. No limitations in general ROM. No crepitus or effusions noted. Foot type and digits show no abnormalities. Bony prominence unremarkable.. Skin:  Porokeratosis sub 3,4 left foot..  No infection or ulcers  Diagnosis:  Tinea unguium, Pain in right toe, pain in left toes Porokeratosis Left,  Heloma durum fifth toe left foot.  Treatment & Plan Procedures and Treatment: Consent by patient was obtained for treatment procedures. The patient understood the discussion of treatment and procedures well. All questions were answered thoroughly reviewed. Debridement of mycotic and hypertrophic toenails, 1 through 5 bilateral and clearing of subungual debris. No ulceration, no infection noted. Debridement of porokeratosis .  Debridement heloma durum fifth toe left  foot. Return Visit-Office Procedure: Patient instructed to return to the office for a follow up visit 3 months for continued evaluation and treatment.  Helane GuntherGregory Frantz Quattrone DPM

## 2015-11-20 ENCOUNTER — Emergency Department (HOSPITAL_COMMUNITY)
Admission: EM | Admit: 2015-11-20 | Discharge: 2015-11-21 | Disposition: A | Discharge status: Home / Self Care | Payer: Medicare Other | Attending: Emergency Medicine | Admitting: Emergency Medicine

## 2015-11-20 ENCOUNTER — Encounter (HOSPITAL_COMMUNITY): Payer: Self-pay | Admitting: *Deleted

## 2015-11-20 ENCOUNTER — Emergency Department (HOSPITAL_COMMUNITY): Payer: Medicare Other

## 2015-11-20 DIAGNOSIS — I252 Old myocardial infarction: Secondary | ICD-10-CM | POA: Insufficient documentation

## 2015-11-20 DIAGNOSIS — E785 Hyperlipidemia, unspecified: Secondary | ICD-10-CM | POA: Diagnosis not present

## 2015-11-20 DIAGNOSIS — Z79899 Other long term (current) drug therapy: Secondary | ICD-10-CM | POA: Insufficient documentation

## 2015-11-20 DIAGNOSIS — Z87891 Personal history of nicotine dependence: Secondary | ICD-10-CM | POA: Diagnosis not present

## 2015-11-20 DIAGNOSIS — Z8673 Personal history of transient ischemic attack (TIA), and cerebral infarction without residual deficits: Secondary | ICD-10-CM | POA: Diagnosis not present

## 2015-11-20 DIAGNOSIS — Z7982 Long term (current) use of aspirin: Secondary | ICD-10-CM | POA: Insufficient documentation

## 2015-11-20 DIAGNOSIS — R221 Localized swelling, mass and lump, neck: Secondary | ICD-10-CM | POA: Diagnosis not present

## 2015-11-20 DIAGNOSIS — Z9889 Other specified postprocedural states: Secondary | ICD-10-CM | POA: Diagnosis not present

## 2015-11-20 DIAGNOSIS — E0590 Thyrotoxicosis, unspecified without thyrotoxic crisis or storm: Secondary | ICD-10-CM | POA: Insufficient documentation

## 2015-11-20 DIAGNOSIS — K047 Periapical abscess without sinus: Secondary | ICD-10-CM | POA: Diagnosis not present

## 2015-11-20 DIAGNOSIS — M479 Spondylosis, unspecified: Secondary | ICD-10-CM | POA: Insufficient documentation

## 2015-11-20 DIAGNOSIS — J449 Chronic obstructive pulmonary disease, unspecified: Secondary | ICD-10-CM | POA: Diagnosis not present

## 2015-11-20 DIAGNOSIS — R220 Localized swelling, mass and lump, head: Secondary | ICD-10-CM | POA: Diagnosis present

## 2015-11-20 DIAGNOSIS — I10 Essential (primary) hypertension: Secondary | ICD-10-CM | POA: Insufficient documentation

## 2015-11-20 LAB — CBC WITH DIFFERENTIAL/PLATELET
Basophils Absolute: 0 10*3/uL (ref 0.0–0.1)
Basophils Relative: 1 %
Eosinophils Absolute: 0.2 10*3/uL (ref 0.0–0.7)
Eosinophils Relative: 3 %
HCT: 38.4 % (ref 36.0–46.0)
Hemoglobin: 12.6 g/dL (ref 12.0–15.0)
Lymphocytes Relative: 32 %
Lymphs Abs: 2 10*3/uL (ref 0.7–4.0)
MCH: 28.7 pg (ref 26.0–34.0)
MCHC: 32.8 g/dL (ref 30.0–36.0)
MCV: 87.5 fL (ref 78.0–100.0)
Monocytes Absolute: 0.6 10*3/uL (ref 0.1–1.0)
Monocytes Relative: 9 %
Neutro Abs: 3.5 10*3/uL (ref 1.7–7.7)
Neutrophils Relative %: 55 %
Platelets: 425 10*3/uL — ABNORMAL HIGH (ref 150–400)
RBC: 4.39 MIL/uL (ref 3.87–5.11)
RDW: 16.4 % — ABNORMAL HIGH (ref 11.5–15.5)
WBC: 6.3 10*3/uL (ref 4.0–10.5)

## 2015-11-20 LAB — BASIC METABOLIC PANEL
Anion gap: 7 (ref 5–15)
BUN: 16 mg/dL (ref 6–20)
CO2: 29 mmol/L (ref 22–32)
Calcium: 9.2 mg/dL (ref 8.9–10.3)
Chloride: 104 mmol/L (ref 101–111)
Creatinine, Ser: 0.6 mg/dL (ref 0.44–1.00)
GFR calc Af Amer: >60 mL/min (ref >60)
GFR calc non Af Amer: >60 mL/min (ref >60)
Glucose, Bld: 99 mg/dL (ref 65–99)
Potassium: 4.9 mmol/L (ref 3.5–5.1)
Sodium: 140 mmol/L (ref 135–145)

## 2015-11-20 MED ORDER — PENICILLIN V POTASSIUM 500 MG PO TABS: 500.0000 mg | ORAL_TABLET | Freq: Four times a day (QID) | ORAL | Status: DC

## 2015-11-20 MED ORDER — IBUPROFEN 800 MG PO TABS: 800.0000 mg | ORAL_TABLET | Freq: Three times a day (TID) | ORAL | Status: DC | PRN

## 2015-11-20 NOTE — ED Notes (Signed)
Discharge instructions, follow up care, and rx x2 reviewed with patient. Patient verbalized understanding. 

## 2015-11-20 NOTE — ED Provider Notes (Signed)
CSN: [id number]     Arrival date & time 11/20/15  1647 History   First MD Initiated Contact with Patient 11/20/15 2055     Chief Complaint  Patient presents with  . Facial Swelling    HPI  Patient is a pleasant 74 y/o AAF here with a CC of L sided neck and facial swelling that began around lunchtime today.  Swelling is localized to the post-auricular region down through the left side of her neck and into her cheek.  She states the swelling felt like it was worsening but has been stable for the past several hours.  She has a hx of angioedema on lisinopril in April.  She has since d/c her lisinopril and has no med changes since. She denies wheezing, SOB, CP, angioedema, dysphagia, urticaria, eyelid drooping, numbness or tingling.     Past Medical History  Diagnosis Date  . STEMI (ST elevation myocardial infarction) [facility]) May 2011    Promus DES in the proximal, mid and distal RCA in May 2011  . HTN (hypertension)   . Hyperlipidemia   . PVD (peripheral vascular disease) (HCC)     Lower extremity Dopplers  May 2revealed an ABI of 0.83 in the right posterior   . COPD (chronic obstructive pulmonary disease) (HCC)   . Hyperthyroidism     treated wtih radioactive iodine  . OA (osteoarthritis of spine)   . Stroke [facility])    Past Surgical History  Procedure Laterality Date  . Total hip arthroplasty  2004    left  . Abdominal hysterectomy      Had ovarian resection and required lysis of adhesions  . Cardiac catheterization  04/07/2010    EF 60-70%  . Coronary stents    . Esophagogastroduodenoscopy N/A 03/10/2015    Procedure: ESOPHAGOGASTRODUODENOSCOPY (EGD);  Surgeon: Wandalee Ferdinand, MD;  Location: [facility] ENDOSCOPY;  Service: Endoscopy;  Laterality: N/A;   Family History  Problem Relation Age of Onset  . Heart attack Father   . Peripheral vascular disease Mother    Social History  Substance Use Topics  . Smoking status: Former Smoker -- 1.00 packs/day for 45 years    Types: Cigarettes    Quit  date: 04/07/2010  . Smokeless tobacco: None  . Alcohol Use: No   OB History    No data available     Review of Systems All other systems negative except as documented in the HPI. All pertinent positives and negatives as reviewed in the HPI.    Allergies  Iodinated diagnostic agents; Shellfish-derived products; Ace inhibitors; Hydrocodone; and Other  Home Medications   Prior to Admission medications   Medication Sig Start Date End Date Taking? Authorizing Provider  aspirin 81 MG tablet Take 81 mg by mouth daily.      Historical Provider, MD  atorvastatin (LIPITOR) 20 MG tablet Take 1 tablet (20 mg total) by mouth daily. 10/03/15   Kathleene Hazel, MD  hydrALAZINE (APRESOLINE) 25 MG tablet Take 1 tablet (25 mg total) by mouth daily. 07/09/15   Kathleene Hazel, MD  hydrochlorothiazide (HYDRODIURIL) 25 MG tablet Take 1 tablet (25 mg total) by mouth daily. 05/31/15   Kathleene Hazel, MD  methimazole (TAPAZOLE) 5 MG tablet Take 5 mg by mouth daily.    Historical Provider, MD  metoprolol succinate (TOPROL-XL) 50 MG 24 hr tablet TAKE 1 TABLET BY MOUTH DAILY. TAKE WITH OR IMMEDIATELY FOLLOWING A MEAL 08/15/15   Kathleene Hazel, MD  nitroGLYCERIN (NITROSTAT) 0.4 MG SL tablet  Place 1 tablet (0.4 mg total) under the tongue every 5 (five) minutes as needed for chest pain. 01/04/15   Kathleene Hazelhristopher D McAlhany, MD  PROAIR HFA 108 (90 BASE) MCG/ACT inhaler Inhale 1 puff into the lungs every 6 (six) hours as needed for wheezing or shortness of breath.  10/31/13   Historical Provider, MD  Tavaborole (KERYDIN) 5 % SOLN Apply 1 drop to each affected toenail once daily for 12 months 02/07/14   Alvan Dameichard Sikora, DPM   BP 119/82 mmHg  Pulse 70  Temp(Src) 98.1 F (36.7 C) (Oral)  Resp 18  SpO2 98%   Physical Exam  Constitutional: She is oriented to person, place, and time. She appears well-developed and well-nourished.  Non-toxic appearance. She does not appear ill. No distress.  HENT:    Head: Normocephalic and atraumatic.  Nose: Nose normal.  Mouth/Throat: Uvula is midline and oropharynx is clear and moist. No trismus in the jaw. No uvula swelling. No oropharyngeal exudate.    Eyes: EOM are normal. Pupils are equal, round, and reactive to light.  Neck: Normal range of motion. Neck supple. Normal carotid pulses present. No thyromegaly present.  Soft tissue swelling to palpation of the L post-auricular region into the L posterior neck.    Cardiovascular: Normal rate, regular rhythm and normal heart sounds.   Pulmonary/Chest: Effort normal and breath sounds normal. No stridor. No respiratory distress. She has no wheezes.  Lymphadenopathy:    She has cervical adenopathy.  Neurological: She is alert and oriented to person, place, and time. No cranial nerve deficit.  Skin: Skin is warm and dry. No rash noted. No erythema.    ED Course  Procedures (including critical care time) Labs Review Labs Reviewed  CBC WITH DIFFERENTIAL/PLATELET - Abnormal; Notable for the following:    RDW 16.4 (*)    Platelets 425 (*)    All other components within normal limits  BASIC METABOLIC PANEL    Imaging Review Ct Soft Tissue Neck Wo Contrast  11/20/2015  CLINICAL DATA:  LEFT jaw/neck swelling beginning 1 p.m. today. LEFT ear fluid filled at sensation. History of mouth swelling related to medication allergy. History of hypertension, stroke, hyperlipidemia, hyperthyroidism, ACE inhibitor angioedema. EXAM: CT NECK WITHOUT CONTRAST TECHNIQUE: Multidetector CT imaging of the neck was performed following the standard protocol without intravenous contrast. No contrast administered as patient reports iodine allergy. COMPARISON:  MRI of the brain May 30, 2010 FINDINGS: Pharynx/larynx: A few punctate tonsilliths compatible with prior infectious/ inflammatory process. Otherwise unremarkable. Major salivary glands:  Normal noncontrast CT appearance. Thyroid:  Normal. Lymph nodes: A few scattered  subcentimeter short axis reniform lymph nodes are likely reactive, limited assessment on noncontrast CT. Vascular structures: Minimal calcific atherosclerosis of the carotid bulbs. Soft tissue/osseous structures: No free fluid or fluid collections. Symmetric appearance of the neck soft tissues. Please note, the jaw is incompletely imaged per neck CT protocol. Absent maxillary teeth. Severe C4-5 and C5-6 disc height loss, uncovertebral hypertrophy commode paddle was degenerative discs. No destructive bony lesions. Lung apices:  Centrilobular emphysema. Intracranial contents: Fluid-filled empty sella, as noted on prior brain MRI. Imaged mastoid air cells are well aerated. IMPRESSION: Negative noncontrast CT neck. Electronically Signed   By: Awilda Metroourtnay  Bloomer M.D.   On: 11/20/2015 23:30   I have personally reviewed and evaluated these images and lab results as part of my medical decision-making.   Patient will be treated for dental abscess.  Told to follow-up with the dentist.  Told to return here as  needed.  She should agrees the plan and all questions were answered   Charlestine Night, PA-C 11/20/15 2339  Nelva Nay, MD 11/21/15 1300

## 2015-11-20 NOTE — ED Provider Notes (Signed)
Patient noted swelling of the left side of her jaw earlier today. She denies any pain. Denies shortness of breath. Denies itching. On exam no obvious facial swelling. She has generally poor dentition. Gingiva immediately lateral to tooth #18 is tender. No obvious fluctuance. No trismus.  Doug SouSam Sriansh Farra, MD 11/20/15 [id number]

## 2015-11-20 NOTE — Discharge Instructions (Signed)
Return here as needed.  Follow-up with a dentist.  Return here as needed.  The CT scan did not show any abnormality

## 2015-11-20 NOTE — ED Notes (Addendum)
PA student at bedside.

## 2015-11-20 NOTE — ED Notes (Addendum)
Pt complains of swelling to her left jaw/neck since 1PM today. Pt states she feels like she has a fluid buildup in her left ear. Pt denies pain. Pt states she has a history of swelling in her mouth and is concerned the swelling may be from another medication.

## 2015-11-20 NOTE — ED Notes (Addendum)
Patient alert and in no acute distress. Denies shortness of breath, wheezing, and hives/itching

## 2015-12-13 DIAGNOSIS — E0500 Thyrotoxicosis with diffuse goiter without thyrotoxic crisis or storm: Secondary | ICD-10-CM | POA: Diagnosis not present

## 2015-12-13 DIAGNOSIS — I2510 Atherosclerotic heart disease of native coronary artery without angina pectoris: Secondary | ICD-10-CM | POA: Diagnosis not present

## 2015-12-13 DIAGNOSIS — I10 Essential (primary) hypertension: Secondary | ICD-10-CM | POA: Diagnosis not present

## 2015-12-13 DIAGNOSIS — R799 Abnormal finding of blood chemistry, unspecified: Secondary | ICD-10-CM | POA: Diagnosis not present

## 2015-12-13 DIAGNOSIS — E559 Vitamin D deficiency, unspecified: Secondary | ICD-10-CM | POA: Diagnosis not present

## 2016-01-08 ENCOUNTER — Other Ambulatory Visit: Payer: Self-pay | Admitting: Physician Assistant

## 2016-01-08 DIAGNOSIS — M1611 Unilateral primary osteoarthritis, right hip: Secondary | ICD-10-CM | POA: Diagnosis not present

## 2016-01-08 NOTE — H&P (Signed)
TOTAL HIP ADMISSION H&P  Patient is admitted for right total hip arthroplasty.  Subjective:  Chief Complaint: right hip pain  HPI: Kristine [name], 75 y.o. female, has a history of pain and functional disability in the right hip(s) due to arthritis and patient has failed non-surgical conservative treatments for greater than 12 weeks to include NSAID's and/or analgesics and corticosteriod injections.  Onset of symptoms was gradual starting 1 years ago with rapidlly worsening course since that time.The patient noted no past surgery on the right hip(s).  Patient currently rates pain in the right hip at 10 out of 10 with activity. Patient has night pain, worsening of pain with activity and weight bearing and trendelenberg gait. Patient has evidence of subchondral sclerosis and joint space narrowing by imaging studies. This condition presents safety issues increasing the risk of falls.  There is no current active infection.  Patient Active Problem List   Diagnosis Date Noted  . 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 Angioedema 05/31/2015  . Fever 03/11/2015  . Acute blood loss anemia 03/11/2015  . Melena 03/10/2015  . GI bleed 03/09/2015  . PAD (peripheral artery disease) (HCC) 03/01/2012  . CAD (coronary artery disease) 01/20/2012  . HTN (hypertension)   . Hyperlipidemia   . PVD (peripheral vascular disease) (HCC)   . COPD (chronic obstructive pulmonary disease) (HCC)   . Hyperthyroidism   . OA (osteoarthritis of spine)   . STEMI (ST elevation myocardial infarction) (HCC) 04/07/2010   Past Medical History  Diagnosis Date  . STEMI (ST elevation myocardial infarction) [facility]) May 2011    Promus DES in the proximal, mid and distal RCA in May 2011  . HTN (hypertension)   . Hyperlipidemia   . PVD (peripheral vascular disease) (HCC)     Lower extremity Dopplers  May 2revealed an ABI of 0.83 in the right posterior   . COPD (chronic obstructive pulmonary disease) (HCC)   .  Hyperthyroidism     treated wtih radioactive iodine  . OA (osteoarthritis of spine)   . Stroke [facility])     Past Surgical History  Procedure Laterality Date  . Total hip arthroplasty  2004    left  . Abdominal hysterectomy      Had ovarian resection and required lysis of adhesions  . Cardiac catheterization  04/07/2010    EF 60-70%  . Coronary stents    . Esophagogastroduodenoscopy N/A 03/10/2015    Procedure: ESOPHAGOGASTRODUODENOSCOPY (EGD);  Surgeon: Wandalee Ferdinand, MD;  Location: [facility] ENDOSCOPY;  Service: Endoscopy;  Laterality: N/A;     (Not in a hospital admission) Allergies  Allergen Reactions  . Iodinated Diagnostic Agents Anaphylaxis  . Shellfish-Derived Products Anaphylaxis  . Ace Inhibitors Swelling    Angioedema of the tongue  . Hydrocodone Other (See Comments)    unknown  . Other Other (See Comments)    Allergic to melons.     Social History  Substance Use Topics  . Smoking status: Former Smoker -- 1.00 packs/day for 45 years    Types: Cigarettes    Quit date: 04/07/2010  . Smokeless tobacco: Not on file  . Alcohol Use: No    Family History  Problem Relation Age of Onset  . Heart attack Father   . Peripheral vascular disease Mother      Review of Systems  Constitutional: Negative.   HENT: Negative.   Eyes: Negative.   Respiratory: Negative.   Cardiovascular: Negative.   Gastrointestinal: Negative.   Genitourinary: Negative.   Musculoskeletal: Positive for  joint pain.  Skin: Negative.   Neurological: Negative.   Endo/Heme/Allergies: Negative.   Psychiatric/Behavioral: Negative.     Objective:  Physical Exam  Constitutional: She is oriented to person, place, and time. She appears well-developed and well-nourished.  HENT:  Head: Normocephalic and atraumatic.  Eyes: EOM are normal. Pupils are equal, round, and reactive to light.  Neck: Normal range of motion. Neck supple.  Cardiovascular: Normal rate and regular rhythm.   Respiratory: Effort normal and  breath sounds normal.  GI: Soft. Bowel sounds are normal.  Musculoskeletal:  Examination of her right hip reveals moderate tenderness over the greater trochanter.  Markedly positive log roll.  She has pain and weakness with hip flexion.  She is neurovascularly intact distally.    Neurological: She is alert and oriented to person, place, and time.  Skin: Skin is warm and dry.  Psychiatric: She has a normal mood and affect. Her b [id number]$@[id number]^2) as calculated from the following:   Height as of 07/09/15:  (1.549 m).   Weight as of 07/09/15: 58.514 kg (129 lb).   Imaging Review Plain radiographs demonstrate severe degenerative joint disease of the right hip(s). The bone quality appears to be fair for age and reported activity level.  Assessment/Plan:  End stage arthritis, right hip(s)  The patient history, physical examination, clinical judgement of the provider and imaging studies are consistent with end stage degenerative joint disease of the right hip(s) and total hip arthroplasty is deemed medically necessary. The treatment options including medical management, injection therapy, arthroscopy and arthroplasty were discussed at length. The risks and benefits of total hip arthroplasty were presented and reviewed. The risks due to aseptic loosening, infection, stiffness, dislocation/subluxation,  thromboembolic complications and other imponderables were discussed.  The patient acknowledged the explanation, agreed to proceed with the plan and consent was signed. Patient is being admitted for inpatient treatment for surgery, pain control, PT, OT, prophylactic antibiotics, VTE prophylaxis, progressive ambulation and ADL's and discharge planning.The patient is planning to be discharged home with home health services

## 2016-01-09 NOTE — Pre-Procedure Instructions (Signed)
    DARLYNN RICCO  01/09/2016      [facility] - [place], [place] - [address] [place] [address] Phone: [phone] Fax: [phone]  [facility] [address] [place], [place] - [address] AT [facility] [address] [place] [address] Phone: [phone] Fax: [phone]  [facility] - [place], [place] - [address], [address] [place] [address] Phone: [phone] Fax: [phone]    Your procedure is scheduled on Wednesday, February 15th, 2017.  Report to [facility] Admitting at 6:30 A.M.  Call this number if you have problems the morning of surgery:  [phone]   Remember:  Do not eat food or drink liquids after midnight.   Take these medicines the morning of surgery with A SIP OF WATER: Acetaminophen (Tylenol), Hydralazine (Apresoline), Methimazole (Tapazole), Metoprolol succinate (Toprol-XL), Proair inhaler (please bring with you).  7 days prior to surgery, stop taking: Aspirin, NSAIDs, Aleve, Naproxen, Ibuprofen, Advil, Motrin, BC's, Goody's, Fish oil, all herbal medications, and all vitamins.    Do not wear jewelry, make-up or nail polish.  Do not wear lotions, powders, or perfumes.  You may NOT wear deodorant.  Do not shave 48 hours prior to surgery.    Do not bring valuables to the hospital.  [facility] is not responsible for any belongings or valuables.  Contacts, dentures or bridgework may not be worn into surgery.  Leave your suitcase in the car.  After surgery it may be brought to your room.  For patients admitted to the hospital, discharge time will be determined by your treatment team.  Patients discharged the day of surgery will not be allowed to drive home.   Special instructions:  See attached.   Please read over the following fact sheets that you were given. Pain Booklet, Coughing and Deep Breathing, Blood  Transfusion Information, Total Joint Packet, MRSA Information and Surgical Site Infection Prevention

## 2016-01-10 ENCOUNTER — Encounter (HOSPITAL_COMMUNITY)
Admission: RE | Admit: 2016-01-10 | Discharge: 2016-01-10 | Disposition: A | Discharge status: Home / Self Care | Payer: Medicare Other | Source: Ambulatory Visit | Attending: Orthopedic Surgery | Admitting: Orthopedic Surgery

## 2016-01-10 ENCOUNTER — Encounter (HOSPITAL_COMMUNITY): Payer: Self-pay

## 2016-01-10 ENCOUNTER — Other Ambulatory Visit: Payer: Self-pay | Admitting: Physician Assistant

## 2016-01-10 DIAGNOSIS — Z7982 Long term (current) use of aspirin: Secondary | ICD-10-CM | POA: Insufficient documentation

## 2016-01-10 DIAGNOSIS — Z0183 Encounter for blood typing: Secondary | ICD-10-CM | POA: Insufficient documentation

## 2016-01-10 DIAGNOSIS — Z955 Presence of coronary angioplasty implant and graft: Secondary | ICD-10-CM | POA: Diagnosis not present

## 2016-01-10 DIAGNOSIS — Z87891 Personal history of nicotine dependence: Secondary | ICD-10-CM | POA: Insufficient documentation

## 2016-01-10 DIAGNOSIS — H6123 Impacted cerumen, bilateral: Secondary | ICD-10-CM | POA: Diagnosis not present

## 2016-01-10 DIAGNOSIS — Z01812 Encounter for preprocedural laboratory examination: Secondary | ICD-10-CM | POA: Insufficient documentation

## 2016-01-10 DIAGNOSIS — Z79899 Other long term (current) drug therapy: Secondary | ICD-10-CM | POA: Diagnosis not present

## 2016-01-10 DIAGNOSIS — E785 Hyperlipidemia, unspecified: Secondary | ICD-10-CM | POA: Diagnosis not present

## 2016-01-10 DIAGNOSIS — H6993 Unspecified Eustachian tube disorder, bilateral: Secondary | ICD-10-CM | POA: Diagnosis not present

## 2016-01-10 DIAGNOSIS — Z01818 Encounter for other preprocedural examination: Secondary | ICD-10-CM | POA: Diagnosis not present

## 2016-01-10 DIAGNOSIS — I10 Essential (primary) hypertension: Secondary | ICD-10-CM | POA: Diagnosis not present

## 2016-01-10 DIAGNOSIS — H9190 Unspecified hearing loss, unspecified ear: Secondary | ICD-10-CM | POA: Insufficient documentation

## 2016-01-10 DIAGNOSIS — M1611 Unilateral primary osteoarthritis, right hip: Secondary | ICD-10-CM | POA: Diagnosis not present

## 2016-01-10 DIAGNOSIS — J449 Chronic obstructive pulmonary disease, unspecified: Secondary | ICD-10-CM | POA: Insufficient documentation

## 2016-01-10 DIAGNOSIS — I2510 Atherosclerotic heart disease of native coronary artery without angina pectoris: Secondary | ICD-10-CM | POA: Insufficient documentation

## 2016-01-10 DIAGNOSIS — I739 Peripheral vascular disease, unspecified: Secondary | ICD-10-CM | POA: Insufficient documentation

## 2016-01-10 DIAGNOSIS — I252 Old myocardial infarction: Secondary | ICD-10-CM | POA: Diagnosis not present

## 2016-01-10 HISTORY — DX: Urgency of urination: R39.15

## 2016-01-10 HISTORY — DX: Personal history of other medical treatment: Z92.89

## 2016-01-10 HISTORY — DX: Reserved for inherently not codable concepts without codable children: IMO0001

## 2016-01-10 HISTORY — DX: Personal history of diseases of the blood and blood-forming organs and certain disorders involving the immune mechanism: Z86.2

## 2016-01-10 HISTORY — DX: Personal history of pneumonia (recurrent): Z87.01

## 2016-01-10 HISTORY — DX: Frequency of micturition: R35.0

## 2016-01-10 HISTORY — DX: Unspecified hearing loss, unspecified ear: H91.90

## 2016-01-10 LAB — CBC WITH DIFFERENTIAL/PLATELET
Basophils Absolute: 0 10*3/uL (ref 0.0–0.1)
Basophils Relative: 1 %
EOS ABS: 0.2 10*3/uL (ref 0.0–0.7)
Eosinophils Relative: 3 %
HEMATOCRIT: 39.9 % (ref 36.0–46.0)
HEMOGLOBIN: 13 g/dL (ref 12.0–15.0)
LYMPHS ABS: 1.3 10*3/uL (ref 0.7–4.0)
LYMPHS PCT: 22 %
MCH: 28.7 pg (ref 26.0–34.0)
MCHC: 32.6 g/dL (ref 30.0–36.0)
MCV: 88.1 fL (ref 78.0–100.0)
MONOS PCT: 7 %
Monocytes Absolute: 0.4 10*3/uL (ref 0.1–1.0)
NEUTROS ABS: 4.1 10*3/uL (ref 1.7–7.7)
NEUTROS PCT: 67 %
Platelets: 359 10*3/uL (ref 150–400)
RBC: 4.53 MIL/uL (ref 3.87–5.11)
RDW: 15.9 % — ABNORMAL HIGH (ref 11.5–15.5)
WBC: 6 10*3/uL (ref 4.0–10.5)

## 2016-01-10 LAB — TYPE AND SCREEN
ABO/RH(D): AB POS
Antibody Screen: NEGATIVE

## 2016-01-10 LAB — PROTIME-INR
INR: 1.08 (ref 0.00–1.49)
Prothrombin Time: 14.2 seconds (ref 11.6–15.2)

## 2016-01-10 LAB — COMPREHENSIVE METABOLIC PANEL
ALK PHOS: 77 U/L (ref 38–126)
ALT: 10 U/L — AB (ref 14–54)
ANION GAP: 11 (ref 5–15)
AST: 17 U/L (ref 15–41)
Albumin: 3.8 g/dL (ref 3.5–5.0)
BILIRUBIN TOTAL: 0.8 mg/dL (ref 0.3–1.2)
BUN: 13 mg/dL (ref 6–20)
CALCIUM: 9.7 mg/dL (ref 8.9–10.3)
CO2: 26 mmol/L (ref 22–32)
CREATININE: 0.81 mg/dL (ref 0.44–1.00)
Chloride: 104 mmol/L (ref 101–111)
GFR calc non Af Amer: >60 mL/min (ref >60)
GLUCOSE: 83 mg/dL (ref 65–99)
Potassium: 3.7 mmol/L (ref 3.5–5.1)
SODIUM: 141 mmol/L (ref 135–145)
TOTAL PROTEIN: 7.6 g/dL (ref 6.5–8.1)

## 2016-01-10 LAB — APTT: aPTT: 35 seconds (ref 24–37)

## 2016-01-10 LAB — SURGICAL PCR SCREEN
MRSA, PCR: NEGATIVE
Staphylococcus aureus: POSITIVE — AB

## 2016-01-10 NOTE — Progress Notes (Signed)
Left message for patient informing her of PCR results.  Prescription called to [facility] - [address].

## 2016-01-10 NOTE — Progress Notes (Signed)
PCP - Dr. Margaretmary Bayley    - medical clearance in chart Cardiologist - Dr. Clifton James     -cardiac clearance in chart  EKG - 03/12/15 CXR  - denies  Echo- 2011 Stress test - 2014 Cardiac cath - 2011 - 3 stents placed  Patient denies chest pain and shortness of breath at PAT appointment.  Patient states that she was instructed to continue taking 81 mg Aspirin per Dr. Greig Right office but will not take day of surgery.

## 2016-01-11 LAB — URINE CULTURE

## 2016-01-11 NOTE — Progress Notes (Signed)
Anesthesia Chart Review:  Pt is a 75 year old female scheduled for R total hip arthroplasty anterior approach on 01/23/2016 with Dr. Jamison Neighbor.   PCP is Dr. Margaretmary Bayley. Cardiologist is Dr. Verne Carrow. Both have cleared pt for surgery.   PMH includes:  CAD (STEMI 2011, DES to prox, mid and dist RCA), HTN, stroke, PVD, hyperthyroidism, hyperlipidemia, COPD, anemia. Hard of hearing. Former smoker. BMI 23.  Medications include: ASA, lipitor, hyrdralazine, hctz, methimazole, metoprolol, albuterol  Preoperative labs reviewed.  Urine culture pending  EKG 03/09/15: Sinus rhythm. Right atrial enlargement. Abnormal R-wave progression, early transition  Nuclear stress test 10/05/13:  - Normal stress nuclear study. LV Ejection Fraction: 71%. LV Wall Motion: NL LV Function; NL Wall Motion  Cardiac cath 04/07/10:  1. 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  2. CX and LAD with irregularities but no significant obstructive disease.   If no changes, I anticipate pt can proceed with surgery as scheduled.   Rica Mast, FNP-BC [facility]/Anesthesiology Phone: [phone] 01/11/2016 12:15 PM

## 2016-01-17 ENCOUNTER — Encounter: Payer: Self-pay | Admitting: Podiatry

## 2016-01-17 ENCOUNTER — Ambulatory Visit (INDEPENDENT_AMBULATORY_CARE_PROVIDER_SITE_OTHER): Payer: Medicare Other | Admitting: Podiatry

## 2016-01-17 DIAGNOSIS — M79676 Pain in unspecified toe(s): Secondary | ICD-10-CM

## 2016-01-17 DIAGNOSIS — Q828 Other specified congenital malformations of skin: Secondary | ICD-10-CM

## 2016-01-17 DIAGNOSIS — B351 Tinea unguium: Secondary | ICD-10-CM | POA: Diagnosis not present

## 2016-01-17 NOTE — Progress Notes (Signed)
Patient ID: Kristine [name], female   DOB: 04/27/1941, 74 y.o.   MRN: [id number] Complaint:  Visit Type: Patient returns to my office for continued preventative foot care services. Complaint: Patient states" my nails have grown long and thick and become painful to walk and wear shoes"  She also has developed callus on both feet which hurt when walking.  She presents for preventative foot care services. No changes to ROS  Podiatric Exam: Vascular: dorsalis pedis and posterior tibial pulses are palpable bilateral. Capillary return is immediate. Temperature gradient is WNL. Skin turgor WNL  Sensorium: Normal Semmes Weinstein monofilament test. Normal tactile sensation bilaterally. Nail Exam: Pt has thick disfigured discolored nails with subungual debris noted bilateral entire nail hallux through fifth toenails Ulcer Exam: There is no evidence of ulcer or pre-ulcerative changes or infection. Orthopedic Exam: Muscle tone and strength are WNL. No limitations in general ROM. No crepitus or effusions noted. Foot type and digits show no abnormalities. Bony prominence unremarkable.. Skin:  Porokeratosis sub 3,4 left foot..  No infection or ulcers  Diagnosis:  Tinea unguium, Pain in right toe, pain in left toes Porokeratosis Left,  Heloma durum fifth toe left foot.  Treatment & Plan Procedures and Treatment: Consent by patient was obtained for treatment procedures. The patient understood the discussion of treatment and procedures well. All questions were answered thoroughly reviewed. Debridement of mycotic and hypertrophic toenails, 1 through 5 bilateral and clearing of subungual debris. No ulceration, no infection noted. Debridement of porokeratosis .  Debridement heloma durum fifth toe left  foot. Return Visit-Office Procedure: Patient instructed to return to the office for a follow up visit 3 months for continued evaluation and treatment.  Linnette Panella DPM 

## 2016-01-22 MED ORDER — CEFAZOLIN SODIUM-DEXTROSE 2-3 GM-% IV SOLR
2.0000 g | INTRAVENOUS | Status: AC
  Administered 2016-01-23: 2 g via INTRAVENOUS
  Filled 2016-01-22: qty 50

## 2016-01-22 MED ORDER — CHLORHEXIDINE GLUCONATE 4 % EX LIQD: 60.0000 mL | Freq: Once | CUTANEOUS | Status: DC

## 2016-01-22 MED ORDER — LACTATED RINGERS IV SOLN
INTRAVENOUS | Status: DC
  Administered 2016-01-23: 08:00:00 via INTRAVENOUS

## 2016-01-22 NOTE — Anesthesia Preprocedure Evaluation (Signed)
Anesthesia Evaluation  Patient identified by MRN, date of birth, ID band Patient awake    Reviewed: Allergy & Precautions, H&P , Patient's Chart, lab work & pertinent test results, reviewed documented beta blocker date and time   Airway Mallampati: II  TM Distance: >3 FB Neck ROM: full    Dental no notable dental hx.    Pulmonary former smoker,    Pulmonary exam normal breath sounds clear to auscultation       Cardiovascular hypertension,  Rhythm:regular Rate:Normal     Neuro/Psych    GI/Hepatic   Endo/Other    Renal/GU      Musculoskeletal   Abdominal   Peds  Hematology   Anesthesia Other Findings MI (ST elevation myocardial infarction) May 2011 HTN (hypertension)    Hyperlipidemia    PVD (peripheral vascular disease) (HCC)   Lower extremity Dopplers May 2revealed an ABI of 0.83 in the right posterior    COPD (chronic obstructive pulmonary disease)     Hyperthyroidism treated wtih radioactive iodine   OA (osteoarthritis of spine)    Stroke (HCC)           Reproductive/Obstetrics                             Anesthesia Physical Anesthesia Plan  ASA: II  Anesthesia Plan: General   Post-op Pain Management:    Induction: Intravenous  Airway Management Planned: Oral ETT  Additional Equipment:   Intra-op Plan:   Post-operative Plan: Extubation in OR  Informed Consent: I have reviewed the patients History and Physical, chart, labs and discussed the procedure including the risks, benefits and alternatives for the proposed anesthesia with the patient or authorized representative who has indicated his/her understanding and acceptance.   Dental Advisory Given and Dental advisory given  Plan Discussed with: CRNA and Surgeon  Anesthesia Plan Comments: (  Discussed general anesthesia, including possible nausea, instrumentation of airway, sore throat,pulmonary aspiration,  etc. I asked if the were any outstanding questions, or  concerns before we proceeded. )        Anesthesia Quick Evaluation

## 2016-01-23 ENCOUNTER — Encounter (HOSPITAL_COMMUNITY)
Admission: RE | Disposition: A | Discharge status: Home Health | Payer: Self-pay | Source: Ambulatory Visit | Attending: Orthopedic Surgery

## 2016-01-23 ENCOUNTER — Inpatient Hospital Stay (HOSPITAL_COMMUNITY): Payer: Medicare Other | Admitting: Emergency Medicine

## 2016-01-23 ENCOUNTER — Inpatient Hospital Stay (HOSPITAL_COMMUNITY)
Admission: RE | Admit: 2016-01-23 | Discharge: 2016-01-24 | DRG: 470 | Disposition: A | Discharge status: Home Health | Payer: Medicare Other | Source: Ambulatory Visit | Attending: Orthopedic Surgery | Admitting: Orthopedic Surgery

## 2016-01-23 ENCOUNTER — Inpatient Hospital Stay (HOSPITAL_COMMUNITY): Payer: Medicare Other

## 2016-01-23 ENCOUNTER — Inpatient Hospital Stay (HOSPITAL_COMMUNITY): Payer: Medicare Other | Admitting: Certified Registered Nurse Anesthetist

## 2016-01-23 ENCOUNTER — Encounter (HOSPITAL_COMMUNITY): Payer: Self-pay | Admitting: Surgery

## 2016-01-23 DIAGNOSIS — I739 Peripheral vascular disease, unspecified: Secondary | ICD-10-CM | POA: Diagnosis present

## 2016-01-23 DIAGNOSIS — Z8673 Personal history of transient ischemic attack (TIA), and cerebral infarction without residual deficits: Secondary | ICD-10-CM

## 2016-01-23 DIAGNOSIS — E0590 Thyrotoxicosis, unspecified without thyrotoxic crisis or storm: Secondary | ICD-10-CM | POA: Diagnosis present

## 2016-01-23 DIAGNOSIS — M1611 Unilateral primary osteoarthritis, right hip: Secondary | ICD-10-CM | POA: Diagnosis present

## 2016-01-23 DIAGNOSIS — M25551 Pain in right hip: Secondary | ICD-10-CM | POA: Diagnosis not present

## 2016-01-23 DIAGNOSIS — E785 Hyperlipidemia, unspecified: Secondary | ICD-10-CM | POA: Diagnosis present

## 2016-01-23 DIAGNOSIS — I10 Essential (primary) hypertension: Secondary | ICD-10-CM | POA: Diagnosis present

## 2016-01-23 DIAGNOSIS — J449 Chronic obstructive pulmonary disease, unspecified: Secondary | ICD-10-CM | POA: Diagnosis present

## 2016-01-23 DIAGNOSIS — Z91018 Allergy to other foods: Secondary | ICD-10-CM | POA: Diagnosis not present

## 2016-01-23 DIAGNOSIS — Z91041 Radiographic dye allergy status: Secondary | ICD-10-CM

## 2016-01-23 DIAGNOSIS — Z87891 Personal history of nicotine dependence: Secondary | ICD-10-CM

## 2016-01-23 DIAGNOSIS — Z96642 Presence of left artificial hip joint: Secondary | ICD-10-CM | POA: Diagnosis present

## 2016-01-23 DIAGNOSIS — M169 Osteoarthritis of hip, unspecified: Secondary | ICD-10-CM | POA: Diagnosis not present

## 2016-01-23 DIAGNOSIS — Z955 Presence of coronary angioplasty implant and graft: Secondary | ICD-10-CM

## 2016-01-23 DIAGNOSIS — Z91013 Allergy to seafood: Secondary | ICD-10-CM

## 2016-01-23 DIAGNOSIS — D62 Acute posthemorrhagic anemia: Secondary | ICD-10-CM | POA: Diagnosis not present

## 2016-01-23 DIAGNOSIS — Z885 Allergy status to narcotic agent status: Secondary | ICD-10-CM | POA: Diagnosis not present

## 2016-01-23 DIAGNOSIS — Z471 Aftercare following joint replacement surgery: Secondary | ICD-10-CM | POA: Diagnosis not present

## 2016-01-23 DIAGNOSIS — I252 Old myocardial infarction: Secondary | ICD-10-CM

## 2016-01-23 DIAGNOSIS — Z8249 Family history of ischemic heart disease and other diseases of the circulatory system: Secondary | ICD-10-CM | POA: Diagnosis not present

## 2016-01-23 DIAGNOSIS — Z96649 Presence of unspecified artificial hip joint: Secondary | ICD-10-CM

## 2016-01-23 DIAGNOSIS — I2510 Atherosclerotic heart disease of native coronary artery without angina pectoris: Secondary | ICD-10-CM | POA: Diagnosis present

## 2016-01-23 DIAGNOSIS — Z96641 Presence of right artificial hip joint: Secondary | ICD-10-CM | POA: Diagnosis not present

## 2016-01-23 DIAGNOSIS — H9190 Unspecified hearing loss, unspecified ear: Secondary | ICD-10-CM | POA: Diagnosis present

## 2016-01-23 HISTORY — PX: TOTAL HIP ARTHROPLASTY: SHX124

## 2016-01-23 SURGERY — ARTHROPLASTY, HIP, TOTAL, ANTERIOR APPROACH
Anesthesia: General | Site: Hip | Laterality: Right

## 2016-01-23 MED ORDER — MORPHINE SULFATE 15 MG PO TABS
7.5000 mg | ORAL_TABLET | ORAL | Status: DC | PRN
  Administered 2016-01-23 – 2016-01-24 (×2): 7.5 mg via ORAL
  Filled 2016-01-23 (×2): qty 1

## 2016-01-23 MED ORDER — ACETAMINOPHEN 650 MG RE SUPP: 650.0000 mg | Freq: Four times a day (QID) | RECTAL | Status: DC | PRN

## 2016-01-23 MED ORDER — CEFAZOLIN SODIUM 1-5 GM-% IV SOLN
1.0000 g | Freq: Four times a day (QID) | INTRAVENOUS | Status: AC
  Administered 2016-01-23 (×2): 1 g via INTRAVENOUS
  Filled 2016-01-23 (×2): qty 50

## 2016-01-23 MED ORDER — ALUM & MAG HYDROXIDE-SIMETH 200-200-20 MG/5ML PO SUSP: 30.0000 mL | ORAL | Status: DC | PRN

## 2016-01-23 MED ORDER — GLYCOPYRROLATE 0.2 MG/ML IJ SOLN
INTRAMUSCULAR | Status: DC | PRN
Start: 2016-01-23 — End: 2016-01-23
  Administered 2016-01-23: 0.6 mg via INTRAVENOUS

## 2016-01-23 MED ORDER — LIDOCAINE HCL (CARDIAC) 20 MG/ML IV SOLN
INTRAVENOUS | Status: AC
  Filled 2016-01-23: qty 5

## 2016-01-23 MED ORDER — DOCUSATE SODIUM 100 MG PO CAPS
100.0000 mg | ORAL_CAPSULE | Freq: Two times a day (BID) | ORAL | Status: DC
  Administered 2016-01-23 – 2016-01-24 (×3): 100 mg via ORAL
  Filled 2016-01-23 (×3): qty 1

## 2016-01-23 MED ORDER — FENTANYL CITRATE (PF) 100 MCG/2ML IJ SOLN
INTRAMUSCULAR | Status: DC | PRN
  Administered 2016-01-23 (×2): 100 ug via INTRAVENOUS

## 2016-01-23 MED ORDER — ALBUTEROL SULFATE (2.5 MG/3ML) 0.083% IN NEBU: 3.0000 mL | INHALATION_SOLUTION | Freq: Four times a day (QID) | RESPIRATORY_TRACT | Status: DC | PRN

## 2016-01-23 MED ORDER — APIXABAN 2.5 MG PO TABS
2.5000 mg | ORAL_TABLET | Freq: Two times a day (BID) | ORAL | Status: DC
  Administered 2016-01-24: 2.5 mg via ORAL
  Filled 2016-01-23: qty 1

## 2016-01-23 MED ORDER — FENTANYL CITRATE (PF) 100 MCG/2ML IJ SOLN
25.0000 ug | INTRAMUSCULAR | Status: DC | PRN
  Administered 2016-01-23 (×3): 50 ug via INTRAVENOUS

## 2016-01-23 MED ORDER — METOCLOPRAMIDE HCL 5 MG PO TABS: 5.0000 mg | ORAL_TABLET | Freq: Three times a day (TID) | ORAL | Status: DC | PRN

## 2016-01-23 MED ORDER — METOCLOPRAMIDE HCL 5 MG/ML IJ SOLN: 5.0000 mg | Freq: Three times a day (TID) | INTRAMUSCULAR | Status: DC | PRN

## 2016-01-23 MED ORDER — BUPIVACAINE LIPOSOME 1.3 % IJ SUSP
20.0000 mL | INTRAMUSCULAR | Status: AC
  Administered 2016-01-23: 20 mL
  Filled 2016-01-23: qty 20

## 2016-01-23 MED ORDER — APIXABAN 2.5 MG PO TABS: ORAL_TABLET | ORAL | Status: DC

## 2016-01-23 MED ORDER — TRANEXAMIC ACID 1000 MG/10ML IV SOLN
2000.0000 mg | INTRAVENOUS | Status: AC
  Administered 2016-01-23: 2000 mg via TOPICAL
  Filled 2016-01-23: qty 20

## 2016-01-23 MED ORDER — GLYCOPYRROLATE 0.2 MG/ML IJ SOLN
INTRAMUSCULAR | Status: AC
  Filled 2016-01-23: qty 3

## 2016-01-23 MED ORDER — FENTANYL CITRATE (PF) 100 MCG/2ML IJ SOLN
INTRAMUSCULAR | Status: AC
  Administered 2016-01-23: 50 ug via INTRAVENOUS
  Filled 2016-01-23: qty 2

## 2016-01-23 MED ORDER — ROCURONIUM BROMIDE 100 MG/10ML IV SOLN
INTRAVENOUS | Status: DC | PRN
  Administered 2016-01-23: 50 mg via INTRAVENOUS

## 2016-01-23 MED ORDER — FLUTICASONE PROPIONATE 50 MCG/ACT NA SUSP
2.0000 | Freq: Every day | NASAL | Status: DC
  Administered 2016-01-24: 2 via NASAL
  Filled 2016-01-23: qty 16

## 2016-01-23 MED ORDER — SODIUM CHLORIDE 0.9 % IJ SOLN
INTRAMUSCULAR | Status: AC
  Filled 2016-01-23: qty 20

## 2016-01-23 MED ORDER — ROCURONIUM BROMIDE 50 MG/5ML IV SOLN
INTRAVENOUS | Status: AC
  Filled 2016-01-23: qty 1

## 2016-01-23 MED ORDER — EPHEDRINE SULFATE 50 MG/ML IJ SOLN
INTRAMUSCULAR | Status: AC
  Filled 2016-01-23: qty 3

## 2016-01-23 MED ORDER — ONDANSETRON HCL 4 MG/2ML IJ SOLN
INTRAMUSCULAR | Status: DC | PRN
  Administered 2016-01-23: 4 mg via INTRAVENOUS

## 2016-01-23 MED ORDER — ONDANSETRON HCL 4 MG/2ML IJ SOLN
4.0000 mg | Freq: Four times a day (QID) | INTRAMUSCULAR | Status: DC | PRN
  Administered 2016-01-23: 4 mg via INTRAVENOUS
  Filled 2016-01-23: qty 2

## 2016-01-23 MED ORDER — MENTHOL 3 MG MT LOZG: 1.0000 | LOZENGE | OROMUCOSAL | Status: DC | PRN

## 2016-01-23 MED ORDER — MAGNESIUM CITRATE PO SOLN: 1.0000 | Freq: Once | ORAL | Status: DC | PRN

## 2016-01-23 MED ORDER — PROPOFOL 10 MG/ML IV BOLUS
INTRAVENOUS | Status: AC
  Filled 2016-01-23: qty 20

## 2016-01-23 MED ORDER — NEOSTIGMINE METHYLSULFATE 10 MG/10ML IV SOLN
INTRAVENOUS | Status: DC | PRN
  Administered 2016-01-23: 4 mg via INTRAVENOUS

## 2016-01-23 MED ORDER — SODIUM CHLORIDE 0.9 % IJ SOLN
INTRAMUSCULAR | Status: DC | PRN
  Administered 2016-01-23 (×4): 10 mL via INTRAVENOUS

## 2016-01-23 MED ORDER — BISACODYL 10 MG RE SUPP: 10.0000 mg | Freq: Every day | RECTAL | Status: DC | PRN

## 2016-01-23 MED ORDER — DEXAMETHASONE SODIUM PHOSPHATE 10 MG/ML IJ SOLN
10.0000 mg | Freq: Once | INTRAMUSCULAR | Status: AC
  Administered 2016-01-24: 10 mg via INTRAVENOUS
  Filled 2016-01-23: qty 1

## 2016-01-23 MED ORDER — ONDANSETRON HCL 4 MG PO TABS: 4.0000 mg | ORAL_TABLET | Freq: Three times a day (TID) | ORAL | Status: DC | PRN

## 2016-01-23 MED ORDER — ALBUTEROL SULFATE HFA 108 (90 BASE) MCG/ACT IN AERS: 1.0000 | INHALATION_SPRAY | Freq: Four times a day (QID) | RESPIRATORY_TRACT | Status: DC | PRN

## 2016-01-23 MED ORDER — FENTANYL CITRATE (PF) 100 MCG/2ML IJ SOLN
INTRAMUSCULAR | Status: AC
  Filled 2016-01-23: qty 2

## 2016-01-23 MED ORDER — HYDROCHLOROTHIAZIDE 25 MG PO TABS
25.0000 mg | ORAL_TABLET | Freq: Every day | ORAL | Status: DC
  Administered 2016-01-23 – 2016-01-24 (×2): 25 mg via ORAL
  Filled 2016-01-23 (×2): qty 1

## 2016-01-23 MED ORDER — PROPOFOL 10 MG/ML IV BOLUS
INTRAVENOUS | Status: DC | PRN
  Administered 2016-01-23: 90 mg via INTRAVENOUS

## 2016-01-23 MED ORDER — BISACODYL 5 MG PO TBEC: 5.0000 mg | DELAYED_RELEASE_TABLET | Freq: Every day | ORAL | Status: DC | PRN

## 2016-01-23 MED ORDER — ATORVASTATIN CALCIUM 20 MG PO TABS
20.0000 mg | ORAL_TABLET | Freq: Every day | ORAL | Status: DC
  Administered 2016-01-23: 20 mg via ORAL
  Filled 2016-01-23: qty 1

## 2016-01-23 MED ORDER — LIDOCAINE HCL (CARDIAC) 20 MG/ML IV SOLN
INTRAVENOUS | Status: DC | PRN
  Administered 2016-01-23: 80 mg via INTRAVENOUS

## 2016-01-23 MED ORDER — PHENOL 1.4 % MT LIQD: 1.0000 | OROMUCOSAL | Status: DC | PRN

## 2016-01-23 MED ORDER — POTASSIUM CHLORIDE IN NACL 20-0.9 MEQ/L-% IV SOLN
INTRAVENOUS | Status: DC
  Administered 2016-01-23: 14:00:00 via INTRAVENOUS
  Filled 2016-01-23 (×2): qty 1000

## 2016-01-23 MED ORDER — HYDROMORPHONE HCL 1 MG/ML IJ SOLN
0.5000 mg | INTRAMUSCULAR | Status: DC | PRN
  Administered 2016-01-23 (×2): 1 mg via INTRAVENOUS
  Filled 2016-01-23 (×2): qty 1

## 2016-01-23 MED ORDER — MORPHINE SULFATE 15 MG PO TABS: ORAL_TABLET | ORAL | Status: DC

## 2016-01-23 MED ORDER — DIPHENHYDRAMINE HCL 12.5 MG/5ML PO ELIX: 12.5000 mg | ORAL_SOLUTION | ORAL | Status: DC | PRN

## 2016-01-23 MED ORDER — BUPIVACAINE HCL 0.5 % IJ SOLN
INTRAMUSCULAR | Status: DC | PRN
  Administered 2016-01-23: 10 mL

## 2016-01-23 MED ORDER — ACETAMINOPHEN 325 MG PO TABS: 650.0000 mg | ORAL_TABLET | Freq: Four times a day (QID) | ORAL | Status: DC | PRN

## 2016-01-23 MED ORDER — METOPROLOL SUCCINATE ER 50 MG PO TB24
50.0000 mg | ORAL_TABLET | Freq: Every day | ORAL | Status: DC
  Administered 2016-01-23 – 2016-01-24 (×2): 50 mg via ORAL
  Filled 2016-01-23 (×2): qty 1

## 2016-01-23 MED ORDER — SODIUM CHLORIDE 0.9 % IV SOLN
10.0000 mg | INTRAVENOUS | Status: DC | PRN
  Administered 2016-01-23: 25 ug/min via INTRAVENOUS

## 2016-01-23 MED ORDER — CELECOXIB 200 MG PO CAPS
200.0000 mg | ORAL_CAPSULE | Freq: Two times a day (BID) | ORAL | Status: DC
  Administered 2016-01-23 – 2016-01-24 (×3): 200 mg via ORAL
  Filled 2016-01-23 (×3): qty 1

## 2016-01-23 MED ORDER — HYDRALAZINE HCL 25 MG PO TABS
25.0000 mg | ORAL_TABLET | Freq: Every day | ORAL | Status: DC
  Administered 2016-01-23 – 2016-01-24 (×2): 25 mg via ORAL
  Filled 2016-01-23 (×2): qty 1

## 2016-01-23 MED ORDER — BUPIVACAINE HCL (PF) 0.5 % IJ SOLN
INTRAMUSCULAR | Status: AC
  Filled 2016-01-23: qty 10

## 2016-01-23 MED ORDER — ONDANSETRON HCL 4 MG PO TABS: 4.0000 mg | ORAL_TABLET | Freq: Four times a day (QID) | ORAL | Status: DC | PRN

## 2016-01-23 MED ORDER — DIAZEPAM 2 MG PO TABS: 2.0000 mg | ORAL_TABLET | Freq: Four times a day (QID) | ORAL | Status: DC | PRN

## 2016-01-23 MED ORDER — SUCCINYLCHOLINE CHLORIDE 20 MG/ML IJ SOLN
INTRAMUSCULAR | Status: AC
  Filled 2016-01-23: qty 1

## 2016-01-23 MED ORDER — ONDANSETRON HCL 4 MG/2ML IJ SOLN
INTRAMUSCULAR | Status: AC
  Filled 2016-01-23: qty 2

## 2016-01-23 MED ORDER — NITROGLYCERIN 0.4 MG SL SUBL: 0.4000 mg | SUBLINGUAL_TABLET | SUBLINGUAL | Status: DC | PRN

## 2016-01-23 MED ORDER — 0.9 % SODIUM CHLORIDE (POUR BTL) OPTIME
TOPICAL | Status: DC | PRN
  Administered 2016-01-23: 1000 mL

## 2016-01-23 MED ORDER — POLYETHYLENE GLYCOL 3350 17 G PO PACK: 17.0000 g | PACK | Freq: Every day | ORAL | Status: DC | PRN

## 2016-01-23 MED ORDER — METHIMAZOLE 5 MG PO TABS
5.0000 mg | ORAL_TABLET | Freq: Every day | ORAL | Status: DC
  Administered 2016-01-23 – 2016-01-24 (×2): 5 mg via ORAL
  Filled 2016-01-23 (×2): qty 1

## 2016-01-23 MED ORDER — ZOLPIDEM TARTRATE 5 MG PO TABS: 5.0000 mg | ORAL_TABLET | Freq: Every evening | ORAL | Status: DC | PRN

## 2016-01-23 MED ORDER — FENTANYL CITRATE (PF) 250 MCG/5ML IJ SOLN
INTRAMUSCULAR | Status: AC
  Filled 2016-01-23: qty 5

## 2016-01-23 MED ORDER — MIDAZOLAM HCL 2 MG/2ML IJ SOLN
INTRAMUSCULAR | Status: AC
  Filled 2016-01-23: qty 2

## 2016-01-23 SURGICAL SUPPLY — 57 items
APL SKNCLS STERI-STRIP NONHPOA (GAUZE/BANDAGES/DRESSINGS) ×1
BENZOIN TINCTURE PRP APPL 2/3 (GAUZE/BANDAGES/DRESSINGS) ×2 IMPLANT
BLADE SAW SGTL 18X1.27X75 (BLADE) ×2 IMPLANT
BLADE SURG ROTATE 9660 (MISCELLANEOUS) IMPLANT
CAPT HIP TOTAL 2 ×2 IMPLANT
CLSR STERI-STRIP ANTIMIC 1/2X4 (GAUZE/BANDAGES/DRESSINGS) ×4 IMPLANT
COVER SURGICAL LIGHT HANDLE (MISCELLANEOUS) ×2 IMPLANT
DRAPE IMP U-DRAPE 54X76 (DRAPES) ×6 IMPLANT
DRAPE INCISE IOBAN 66X45 STRL (DRAPES) IMPLANT
DRAPE ORTHO SPLIT 77X108 STRL (DRAPES) ×2
DRAPE PROXIMA HALF (DRAPES) ×2 IMPLANT
DRAPE SURG 17X23 STRL (DRAPES) ×2 IMPLANT
DRAPE SURG ORHT 6 SPLT 77X108 (DRAPES) ×2 IMPLANT
DRAPE U-SHAPE 47X51 STRL (DRAPES) ×2 IMPLANT
DRSG AQUACEL AG ADV 3.5X10 (GAUZE/BANDAGES/DRESSINGS) ×2 IMPLANT
DURAPREP 26ML APPLICATOR (WOUND CARE) IMPLANT
ELECT BLADE 4.0 EZ CLEAN MEGAD (MISCELLANEOUS)
ELECT CAUTERY BLADE 6.4 (BLADE) ×2 IMPLANT
ELECT REM PT RETURN 9FT ADLT (ELECTROSURGICAL) ×2
ELECTRODE BLDE 4.0 EZ CLN MEGD (MISCELLANEOUS) IMPLANT
ELECTRODE REM PT RTRN 9FT ADLT (ELECTROSURGICAL) ×1 IMPLANT
FACESHIELD WRAPAROUND (MASK) ×4 IMPLANT
GLOVE BIOGEL PI IND STRL 7.0 (GLOVE) IMPLANT
GLOVE BIOGEL PI IND STRL 8 (GLOVE) ×1 IMPLANT
GLOVE BIOGEL PI INDICATOR 7.0 (GLOVE)
GLOVE BIOGEL PI INDICATOR 8 (GLOVE) ×1
GLOVE ECLIPSE 7.0 STRL STRAW (GLOVE) IMPLANT
GLOVE ORTHO TXT STRL SZ7.5 (GLOVE) ×4 IMPLANT
GLOVE SURG SS PI 6.0 STRL IVOR (GLOVE) ×2 IMPLANT
GOWN STRL REUS W/ TWL LRG LVL3 (GOWN DISPOSABLE) ×3 IMPLANT
GOWN STRL REUS W/ TWL XL LVL3 (GOWN DISPOSABLE) ×1 IMPLANT
GOWN STRL REUS W/TWL LRG LVL3 (GOWN DISPOSABLE) ×3
GOWN STRL REUS W/TWL XL LVL3 (GOWN DISPOSABLE) ×2
KIT BASIN OR (CUSTOM PROCEDURE TRAY) ×2 IMPLANT
KIT ROOM TURNOVER OR (KITS) ×2 IMPLANT
MANIFOLD NEPTUNE II (INSTRUMENTS) ×2 IMPLANT
NDL SAFETY ECLIPSE 18X1.5 (NEEDLE) ×1 IMPLANT
NEEDLE HYPO 18GX1.5 SHARP (NEEDLE) ×1
NS IRRIG 1000ML POUR BTL (IV SOLUTION) ×2 IMPLANT
PACK TOTAL JOINT (CUSTOM PROCEDURE TRAY) ×2 IMPLANT
PACK UNIVERSAL I (CUSTOM PROCEDURE TRAY) ×2 IMPLANT
PAD ARMBOARD 7.5X6 YLW CONV (MISCELLANEOUS) ×2 IMPLANT
SUCTION FRAZIER HANDLE 10FR (MISCELLANEOUS) ×1
SUCTION TUBE FRAZIER 10FR DISP (MISCELLANEOUS) ×1 IMPLANT
SUT MNCRL AB 4-0 PS2 18 (SUTURE) ×2 IMPLANT
SUT VIC AB 0 CT1 27 (SUTURE) ×2
SUT VIC AB 0 CT1 27XBRD ANBCTR (SUTURE) ×1 IMPLANT
SUT VIC AB 1 CT1 27 (SUTURE) ×1
SUT VIC AB 1 CT1 27XBRD ANBCTR (SUTURE) ×1 IMPLANT
SUT VIC AB 2-0 CT1 27 (SUTURE) ×2
SUT VIC AB 2-0 CT1 TAPERPNT 27 (SUTURE) ×2 IMPLANT
SYR 50ML LL SCALE MARK (SYRINGE) ×2 IMPLANT
SYR BULB IRRIGATION 50ML (SYRINGE) ×2 IMPLANT
TOWEL OR 17X24 6PK STRL BLUE (TOWEL DISPOSABLE) ×2 IMPLANT
TOWEL OR 17X26 10 PK STRL BLUE (TOWEL DISPOSABLE) ×2 IMPLANT
TRAY FOLEY CATH 14FR (SET/KITS/TRAYS/PACK) IMPLANT
WATER STERILE IRR 1000ML POUR (IV SOLUTION) IMPLANT

## 2016-01-23 NOTE — Discharge Instructions (Signed)
INSTRUCTIONS AFTER JOINT REPLACEMENT   o Remove items at home which could result in a fall. This includes throw rugs or furniture in walking pathways o ICE to the affected joint every three hours while awake for 30 minutes at a time, for at least the first 3-5 days, and then as needed for pain and swelling.  Continue to use ice for pain and swelling. You may notice swelling that will progress down to the foot and ankle.  This is normal after surgery.  Elevate your leg when you are not up walking on it.   o Continue to use the breathing machine you got in the hospital (incentive spirometer) which will help keep your temperature down.  It is common for your temperature to cycle up and down following surgery, especially at night when you are not up moving around and exerting yourself.  The breathing machine keeps your lungs expanded and your temperature down.   DIET:  As you were doing prior to hospitalization, we recommend a well-balanced diet.  DRESSING / WOUND CARE / SHOWERING  You may change your dressing 3-5 days after surgery.  Then change the dressing every day with sterile gauze.  Please use good hand washing techniques before changing the dressing.  Do not use any lotions or creams on the incision until instructed by your surgeon. and You may shower 3 days after surgery, but keep the wounds dry during showering.  You may use an occlusive plastic wrap (Press'n Seal for example), NO SOAKING/SUBMERGING IN THE BATHTUB.  If the bandage gets wet, change with a clean dry gauze.  If the incision gets wet, pat the wound dry with a clean towel.  ACTIVITY  o Increase activity slowly as tolerated, but follow the weight bearing instructions below.   o No driving for 6 weeks or until further direction given by your physician.  You cannot drive while taking narcotics.  o No lifting or carrying greater than 10 lbs. until further directed by your surgeon. o Avoid periods of inactivity such as sitting longer  than an hour when not asleep. This helps prevent blood clots.  o You may return to work once you are authorized by your doctor.     WEIGHT BEARING   Weight bearing as tolerated with assist device (walker, cane, etc) as directed, use it as long as suggested by your surgeon or therapist, typically at least 4-6 weeks.   EXERCISES  Results after joint replacement surgery are often greatly improved when you follow the exercise, range of motion and muscle strengthening exercises prescribed by your doctor. Safety measures are also important to protect the joint from further injury. Any time any of these exercises cause you to have increased pain or swelling, decrease what you are doing until you are comfortable again and then slowly increase them. If you have problems or questions, call your caregiver or physical therapist for advice.   Rehabilitation is important following a joint replacement. After just a few days of immobilization, the muscles of the leg can become weakened and shrink (atrophy).  These exercises are designed to build up the tone and strength of the thigh and leg muscles and to improve motion. Often times heat used for twenty to thirty minutes before working out will loosen up your tissues and help with improving the range of motion but do not use heat for the first two weeks following surgery (sometimes heat can increase post-operative swelling).   These exercises can be done on a training (  exercise) mat, on the floor, on a table or on a bed. Use whatever works the best and is most comfortable for you.    Use music or television while you are exercising so that the exercises are a pleasant break in your day. This will make your life better with the exercises acting as a break in your routine that you can look forward to.   Perform all exercises about fifteen times, three times per day or as directed.  You should exercise both the operative leg and the other leg as well.  A  rehabilitation program following joint replacement surgery can speed recovery and prevent re-injury in the future due to weakened muscles. Contact your doctor or a physical therapist for more information on knee rehabilitation.    CONSTIPATION  Constipation is defined medically as fewer than three stools per week and severe constipation as less than one stool per week.  Even if you have a regular bowel pattern at home, your normal regimen is likely to be disrupted due to multiple reasons following surgery.  Combination of anesthesia, postoperative narcotics, change in appetite and fluid intake all can affect your bowels.   YOU MUST use at least one of the following options; they are listed in order of increasing strength to get the job done.  They are all available over the counter, and you may need to use some, POSSIBLY even all of these options:    Drink plenty of fluids (prune juice may be helpful) and high fiber foods Colace 100 mg by mouth twice a day  Senokot for constipation as directed and as needed Dulcolax (bisacodyl), take with full glass of water  Miralax (polyethylene glycol) once or twice a day as needed.  If you have tried all these things and are unable to have a bowel movement in the first 3-4 days after surgery call either your surgeon or your primary doctor.    If you experience loose stools or diarrhea, hold the medications until you stool forms back up.  If your symptoms do not get better within 1 week or if they get worse, check with your doctor.  If you experience "the worst abdominal pain ever" or develop nausea or vomiting, please contact the office immediately for further recommendations for treatment.   ITCHING:  If you experience itching with your medications, try taking only a single pain pill, or even half a pain pill at a time.  You can also use Benadryl over the counter for itching or also to help with sleep.   TED HOSE STOCKINGS:  Use stockings on both legs until  for at least 2 weeks or as directed by physician office. They may be removed at night for sleeping.  MEDICATIONS:  See your medication summary on the After Visit Summary that nursing will review with you.  You may have some home medications which will be placed on hold until you complete the course of blood thinner medication.  It is important for you to complete the blood thinner medication as prescribed.  PRECAUTIONS:  If you experience chest pain or shortness of breath - call 911 immediately for transfer to the hospital emergency department.   If you develop a fever greater that 101 F, purulent drainage from wound, increased redness or drainage from wound, foul odor from the wound/dressing, or calf pain - CONTACT YOUR SURGEON.  FOLLOW-UP APPOINTMENTS:  If you do not already have a post-op appointment, please call the office for an appointment to be seen by your surgeon.  Guidelines for how soon to be seen are listed in your After Visit Summary, but are typically between 1-4 weeks after surgery.  OTHER INSTRUCTIONS:   Knee Replacement:  Do not place pillow under knee, focus on keeping the knee straight while resting. CPM instructions: 0-90 degrees, 2 hours in the morning, 2 hours in the afternoon, and 2 hours in the evening. Place foam block, curve side up under heel at all times except when in CPM or when walking.  DO NOT modify, tear, cut, or change the foam block in any way.  MAKE SURE YOU:   Understand these instructions.   Get help right away if you are not doing well or get worse.    Thank you for letting us be a part of your medical care team.  It is a privilege we respect greatly.  We hope these instructions will help you stay on track for a fast and full recovery!   Information on my medicine - ELIQUIS (apixaban)  This medication education was reviewed with me or my healthcare representative as part of my discharge preparation.   The pharmacist that spoke with me during my hospital stay was:  Fayne Norrie, [facility]  Why was Eliquis prescribed for you? Eliquis was prescribed for you to reduce the risk of blood clots forming after orthopedic surgery.    What do You need to know about Eliquis? Take your Eliquis TWICE DAILY - one tablet in the morning and one tablet in the evening with or without food.  It would be best to take the dose about the same time each day.  If you have difficulty swallowing the tablet whole please discuss with your pharmacist how to take the medication safely.  Take Eliquis exactly as prescribed by your doctor and DO NOT stop taking Eliquis without talking to the doctor who prescribed the medication.  Stopping without other medication to take the place of Eliquis may increase your risk of developing a clot.  After discharge, you should have regular check-up appointments with your healthcare provider that is prescribing your Eliquis.  What do you do if you miss a dose? If a dose of ELIQUIS is not taken at the scheduled time, take it as soon as possible on the same day and twice-daily administration should be resumed.  The dose should not be doubled to make up for a missed dose.  Do not take more than one tablet of ELIQUIS at the same time.  Important Safety Information A possible side effect of Eliquis is bleeding. You should call your healthcare provider right away if you experience any of the following: ? Bleeding from an injury or your nose that does not stop. ? Unusual colored urine (red or dark brown) or unusual colored stools (red or black). ? Unusual bruising for unknown reasons. ? A serious fall or if you hit your head (even if there is no bleeding).  Some medicines may interact with Eliquis and might increase your risk of bleeding or clotting while on Eliquis. To help avoid this, consult your healthcare provider or pharmacist prior to using any new prescription or  non-prescription medications, including herbals, vitamins, non-steroidal anti-inflammatory drugs (NSAIDs) and supplements.  This website has more information on Eliquis (apixaban): http://www.eliquis.com/eliquis/home

## 2016-01-23 NOTE — Anesthesia Postprocedure Evaluation (Signed)
Anesthesia Post Note  Patient: Kristine [name]  Procedure(s) Performed: Procedure(s) (LRB): RIGHT TOTAL HIP ARTHROPLASTY ANTERIOR APPROACH (Right)  Patient location during evaluation: PACU Anesthesia Type: General Level of consciousness: sedated Pain management: satisfactory to patient Vital Signs Assessment: post-procedure vital signs reviewed and stable Respiratory status: spontaneous breathing Cardiovascular status: stable Anesthetic complications: no    Last Vitals:  Filed Vitals:   01/23/16 1122 01/23/16 1128  BP:  146/77  Pulse: 58 66  Temp:    Resp: 16 10    Last Pain:  Filed Vitals:   01/23/16 1129  PainSc: 10-Worst pain ever                 Kristine [name]

## 2016-01-23 NOTE — Op Note (Signed)
Kristine, [name] NO.:  [id number]  MEDICAL RECORD NO.:  [id number]  LOCATION:  [id number]                        FACILITY:  [facility]  PHYSICIAN:  Loreta Ave, M.D. DATE OF BIRTH:  [birth year]  DATE OF PROCEDURE:  01/23/2016 DATE OF DISCHARGE:                              OPERATIVE REPORT   PREOPERATIVE DIAGNOSIS:  Right hip end-stage arthritis, primary generalized.  POSTOPERATIVE DIAGNOSIS:  Right hip end-stage arthritis, primary generalized.  PROCEDURE:  Direct anterior right total hip replacement utilizing Stryker prosthesis.  Press-fit 52 mm acetabular component.  Screw fixation x2.  36 mm internal diameter liner.  A press-fit #2 Accolade stem with a 36+ 0 Biolox head.  SURGEON:  Loreta Ave, M.D.  ASSISTANT:  Mikey Kirschner, P.A., present throughout the entire case and necessary for timely completion of procedure.  ANESTHESIA:  General.  BLOOD LOSS:  Less than 200 mL.  BLOOD GIVEN:  None.  SPECIMENS:  None.  CULTURES:  None.  COMPLICATIONS:  None.  DRESSINGS:  Soft compressive.  PROCEDURE IN DETAIL:  The patient was brought to the operating room, placed on operating table in supine position.  After adequate anesthesia had been obtained, appropriate positioning, prepped and draped for direct anterior approach.  Longitudinal incision just behind ASIS going distally.  Skin and subcutaneous tissue divided.  Fascia of the tensor opened.  Muscle retracted anteriorly.  Front of the capsule exposed. Anterior capsule excised.  Retractors put in place.  Femoral neck cut 1 fingerbreadth above the lesser trochanter.  Acetabulum exposed.  Brought up to good bleeding bone.  Sized and fitted for a 52 mm component and hammered in place at 40 degrees of abduction.  Good capturing and fixation, augmented with 2 screws through the cup.  A 36 mm internal diameter liner.  Femur was exposed, brought up to good fitting with a #2 stem.  After  appropriate trials, I see there was the #2 stem and added a 36+ 0 Biolox head.  This gave great stability and equal leg lengths. Wound thoroughly irrigated.  I injected with Exparel.  Fascia closed with #1 Vicryl.  Subcutaneous and subcuticular closure.  Margins were injected with Marcaine.  Sterile compressive dressing applied.  Anesthesia reversed.  Brought to the recovery room.  Tolerated the surgery well.  No complications.     Loreta Ave, M.D.     DFM/MEDQ  D:  01/23/2016  T:  01/23/2016  Job:  [id number]

## 2016-01-23 NOTE — Progress Notes (Signed)
   01/23/16 1100  Clinical Encounter Type  Visited With Patient not available  Visit Type Spiritual support  Referral From Nurse  Spiritual Encounters  Spiritual Needs Literature  Chaplain attempted to assist patient with AD but patient already in early morning surgery. Nurse suggested we return tomorrow to assist.

## 2016-01-23 NOTE — Transfer of Care (Signed)
Immediate Anesthesia Transfer of Care Note  Patient: Kristine [name]  Procedure(s) Performed: Procedure(s): RIGHT TOTAL HIP ARTHROPLASTY ANTERIOR APPROACH (Right)  Patient Location: PACU  Anesthesia Type:General  Level of Consciousness: awake, alert  and oriented  Airway & Oxygen Therapy: Patient Spontanous Breathing and Patient connected to nasal cannula oxygen  Post-op Assessment: Report given to RN, Post -op Vital signs reviewed and stable and Patient moving all extremities X 4  Post vital signs: Reviewed and stable  Last Vitals:  Filed Vitals:   01/23/16 1026 01/23/16 1030  BP:  128/82  Pulse: 68 69  Temp: 36.3 C   Resp: 13 18    Complications: No apparent anesthesia complications

## 2016-01-23 NOTE — H&P (View-Only) (Signed)
TOTAL HIP ADMISSION H&P  Patient is admitted for right total hip arthroplasty.  Subjective:  Chief Complaint: right hip pain  HPI: Kristine [name], 75 y.o. female, has a history of pain and functional disability in the right hip(s) due to arthritis and patient has failed non-surgical conservative treatments for greater than 12 weeks to include NSAID's and/or analgesics and corticosteriod injections.  Onset of symptoms was gradual starting 1 years ago with rapidlly worsening course since that time.The patient noted no past surgery on the right hip(s).  Patient currently rates pain in the right hip at 10 out of 10 with activity. Patient has night pain, worsening of pain with activity and weight bearing and trendelenberg gait. Patient has evidence of subchondral sclerosis and joint space narrowing by imaging studies. This condition presents safety issues increasing the risk of falls.  There is no current active infection.  Patient Active Problem List   Diagnosis Date Noted  . 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 Angioedema 05/31/2015  . Fever 03/11/2015  . Acute blood loss anemia 03/11/2015  . Melena 03/10/2015  . GI bleed 03/09/2015  . PAD (peripheral artery disease) (HCC) 03/01/2012  . CAD (coronary artery disease) 01/20/2012  . HTN (hypertension)   . Hyperlipidemia   . PVD (peripheral vascular disease) (HCC)   . COPD (chronic obstructive pulmonary disease) (HCC)   . Hyperthyroidism   . OA (osteoarthritis of spine)   . STEMI (ST elevation myocardial infarction) (HCC) 04/07/2010   Past Medical History  Diagnosis Date  . STEMI (ST elevation myocardial infarction) [facility]) May 2011    Promus DES in the proximal, mid and distal RCA in May 2011  . HTN (hypertension)   . Hyperlipidemia   . PVD (peripheral vascular disease) (HCC)     Lower extremity Dopplers  May 2revealed an ABI of 0.83 in the right posterior   . COPD (chronic obstructive pulmonary disease) (HCC)   .  Hyperthyroidism     treated wtih radioactive iodine  . OA (osteoarthritis of spine)   . Stroke [facility])     Past Surgical History  Procedure Laterality Date  . Total hip arthroplasty  2004    left  . Abdominal hysterectomy      Had ovarian resection and required lysis of adhesions  . Cardiac catheterization  04/07/2010    EF 60-70%  . Coronary stents    . Esophagogastroduodenoscopy N/A 03/10/2015    Procedure: ESOPHAGOGASTRODUODENOSCOPY (EGD);  Surgeon: Wandalee Ferdinand, MD;  [facility] END[facility]COPY;  Service: Endoscopy;  Laterality: N/A;     (Not in a hospital admission) Allergies  Allergen Reactions  . Iodinated Diagnostic Agents Anaphylaxis  . Shellfish-Derived Products Anaphylaxis  . Ace Inhibitors Swelling    Angioedema of the tongue  . Hydrocodone Other (See Comments)    unknown  . Other Other (See Comments)    Allergic to melons.     Social History  Substance Use Topics  . Smoking status: Former Smoker -- 1.00 packs/day for 45 years    Types: Cigarettes    Quit date: 04/07/2010  . Smokeless tobacco: Not on file  . Alcohol Use: No    Family History  Problem Relation Age of Onset  . Heart attack Father   . Peripheral vascular disease Mother      Review of Systems  Constitutional: Negative.   HENT: Negative.   Eyes: Negative.   Respiratory: Negative.   Cardiovascular: Negative.   Gastrointestinal: Negative.   Genitourinary: Negative.   Musculoskeletal: Positive for  joint pain.  Skin: Negative.   Neurological: Negative.   Endo/Heme/Allergies: Negative.   Psychiatric/Behavioral: Negative.     Objective:  Physical Exam  Constitutional: She is oriented to person, place, and time. She appears well-developed and well-nourished.  HENT:  Head: Normocephalic and atraumatic.  Eyes: EOM are normal. Pupils are equal, round, and reactive to light.  Neck: Normal range of motion. Neck supple.  Cardiovascular: Normal rate and regular rhythm.   Respiratory: Effort normal and  breath sounds normal.  GI: Soft. Bowel sounds are normal.  Musculoskeletal:  Examination of her right hip reveals moderate tenderness over the greater trochanter.  Markedly positive log roll.  She has pain and weakness with hip flexion.  She is neurovascularly intact distally.    Neurological: She is alert and oriented to person, place, and time.  Skin: Skin is warm and dry.  Psychiatric: She has a normal mood and affect. Her b [id number]$@[id number]^2) as calculated from the following:   Height as of 07/09/15:  (1.549 m).   Weight as of 07/09/15: 58.514 kg (129 lb).   Imaging Review Plain radiographs demonstrate severe degenerative joint disease of the right hip(s). The bone quality appears to be fair for age and reported activity level.  Assessment/Plan:  End stage arthritis, right hip(s)  The patient history, physical examination, clinical judgement of the provider and imaging studies are consistent with end stage degenerative joint disease of the right hip(s) and total hip arthroplasty is deemed medically necessary. The treatment options including medical management, injection therapy, arthroscopy and arthroplasty were discussed at length. The risks and benefits of total hip arthroplasty were presented and reviewed. The risks due to aseptic loosening, infection, stiffness, dislocation/subluxation,  thromboembolic complications and other imponderables were discussed.  The patient acknowledged the explanation, agreed to proceed with the plan and consent was signed. Patient is being admitted for inpatient treatment for surgery, pain control, PT, OT, prophylactic antibiotics, VTE prophylaxis, progressive ambulation and ADL's and discharge planning.The patient is planning to be discharged home with home health services

## 2016-01-23 NOTE — Anesthesia Procedure Notes (Signed)
Procedure Name: Intubation Date/Time: 01/23/2016 8:40 AM Performed by: Sharlene Dory E Pre-anesthesia Checklist: Patient identified, Emergency Drugs available, Suction available, Patient being monitored and Timeout performed Patient Re-evaluated:Patient Re-evaluated prior to inductionOxygen Delivery Method: Circle system utilized Preoxygenation: Pre-oxygenation with 100% oxygen Intubation Type: IV induction Ventilation: Mask ventilation without difficulty Laryngoscope Size: Mac and 4 Grade View: Grade I Tube type: Oral Tube size: 7.0 mm Number of attempts: 1 Airway Equipment and Method: Stylet Placement Confirmation: ETT inserted through vocal cords under direct vision,  positive ETCO2 and breath sounds checked- equal and bilateral Secured at: 21 cm Tube secured with: Tape Dental Injury: Teeth and Oropharynx as per pre-operative assessment

## 2016-01-23 NOTE — Discharge Summary (Addendum)
Patient ID: Kristine [name] MRN: [id number] DOB/AGE: [birth year] 73 y.o.  Admit date: 01/23/2016 Discharge date: 01/24/2016  Admission Diagnoses:  Active Problems:   Status post total replacement of hip   Discharge Diagnoses:  Same  Past Medical History  Diagnosis Date  . STEMI (ST elevation myocardial infarction) [facility]) May 2011    Promus DES in the proximal, mid and distal RCA in May 2011  . HTN (hypertension)   . Hyperlipidemia   . PVD (peripheral vascular disease) (HCC)     Lower extremity Dopplers  May 2revealed an ABI of 0.83 in the right posterior   . COPD (chronic obstructive pulmonary disease) (HCC)   . Hyperthyroidism     treated wtih radioactive iodine  . OA (osteoarthritis of spine)   . Stroke (HCC)   . Shortness of breath dyspnea   . History of pneumonia   . Urinary urgency   . Urinary frequency   . History of anemia   . History of blood transfusion   . Hard of hearing     Surgeries: Procedure(s): RIGHT TOTAL HIP ARTHROPLASTY ANTERIOR APPROACH on 01/23/2016   Consultants:    Discharged Condition: Improved  Hospital Course: Kristine [name] is an 75 y.o. female who was admitted 01/23/2016 for operative treatment of primary localized osteoarthritis right hip. Patient has severe unremitting pain that affects sleep, daily activities, and work/hobbies. After pre-op clearance the patient was taken to the operating room on 01/23/2016 and underwent  Procedure(s): RIGHT TOTAL HIP ARTHROPLASTY ANTERIOR APPROACH.  Patient with a pre-op Hb of 13.0 developed ABLA on pod #1 with a Hb of 9.9.  She is currently stable but we will continue to follow.  Patient was given perioperative antibiotics:      Anti-infectives    Start     Dose/Rate Route Frequency Ordered Stop   01/23/16 1500  ceFAZolin (ANCEF) IVPB 1 g/50 mL premix     1 g 100 mL/hr over 30 Minutes Intravenous Every 6 hours 01/23/16 1258 01/23/16 2247   01/23/16 0800  ceFAZolin (ANCEF) IVPB 2 g/50 mL premix     2  g 100 mL/hr over 30 Minutes Intravenous To ShortStay Surgical 01/22/16 1213 01/23/16 0852       Patient was given sequential compression devices, early ambulation, and chemoprophylaxis to prevent DVT.  Patient benefited maximally from hospital stay and there were no complications.    Recent vital signs:  Patient Vitals for the past 24 hrs:  BP Temp Temp src Pulse Resp SpO2  01/24/16 0500 (!) 100/51 mmHg 98.2 F (36.8 C) - 72 18 100 %  01/23/16 1941 (!) 102/52 mmHg 98.7 F (37.1 C) Oral 69 18 100 %  01/23/16 1511 123/65 mmHg - - - - -  01/23/16 1225 (!) 109/53 mmHg 97.5 F (36.4 C) Oral 64 14 100 %  01/23/16 1158 124/82 mmHg - - (!) 58 12 100 %  01/23/16 1143 133/89 mmHg - - 64 (!) 4 100 %  01/23/16 1128 (!) 146/77 mmHg - - 66 10 100 %  01/23/16 1122 - - - (!) 58 16 99 %  01/23/16 1121 - - - 65 13 100 %  01/23/16 1115 131/78 mmHg - - - 12 -  01/23/16 1100 138/83 mmHg - - - 16 -  01/23/16 1053 - - - - 12 -  01/23/16 1045 126/74 mmHg - - 67 15 100 %  01/23/16 1030 128/82 mmHg - - 69 18 100 %  01/23/16 1026 - 97.4 F (36.3  C) - 68 13 100 %  01/23/16 0721 135/69 mmHg 97.9 F (36.6 C) Oral 68 20 100 %     Recent laboratory studies: No results for input(s): WBC, HGB, HCT, PLT, NA, K, CL, CO2, BUN, CREATININE, GLUCOSE, INR, CALCIUM in the last 72 hours.  Invalid input(s): PT, 2   Discharge Medications:     Medication List    STOP taking these medications        acetaminophen 500 MG tablet  Commonly known as:  TYLENOL     aspirin EC 81 MG tablet     ibuprofen 800 MG tablet  Commonly known as:  ADVIL,MOTRIN     mupirocin ointment 2 %  Commonly known as:  BACTROBAN      TAKE these medications        apixaban 2.5 MG Tabs tablet  Commonly known as:  ELIQUIS  Take 1 tab po q12 hours x 30 days following surgery to prevent blood clots     atorvastatin 20 MG tablet  Commonly known as:  LIPITOR  Take 1 tablet (20 mg total) by mouth daily.     bisacodyl 5 MG EC tablet   Commonly known as:  DULCOLAX  Take 1 tablet (5 mg total) by mouth daily as needed for moderate constipation.     fluticasone 50 MCG/ACT nasal spray  Commonly known as:  FLONASE  USE 2 SPRAYS IN EACH NOSTRIL QD AT NIGHT     hydrALAZINE 25 MG tablet  Commonly known as:  APRESOLINE  Take 1 tablet (25 mg total) by mouth daily.     hydrochlorothiazide 25 MG tablet  Commonly known as:  HYDRODIURIL  Take 1 tablet (25 mg total) by mouth daily.     methimazole 5 MG tablet  Commonly known as:  TAPAZOLE  Take 5 mg by mouth daily.     metoprolol succinate 50 MG 24 hr tablet  Commonly known as:  TOPROL-XL  TAKE 1 TABLET BY MOUTH DAILY. TAKE WITH OR IMMEDIATELY FOLLOWING A MEAL     morphine 15 MG tablet  Commonly known as:  MSIR  Take 1/2-1 tab po q4-6 hours prn pain     nitroGLYCERIN 0.4 MG SL tablet  Commonly known as:  NITROSTAT  Place 1 tablet (0.4 mg total) under the tongue every 5 (five) minutes as needed for chest pain.     ondansetron 4 MG tablet  Commonly known as:  ZOFRAN  Take 1 tablet (4 mg total) by mouth every 8 (eight) hours as needed for nausea or vomiting.     PROAIR HFA IN  Inhale into the lungs.     PROAIR HFA 108 (90 Base) MCG/ACT inhaler  Generic drug:  albuterol  Inhale 1 puff into the lungs every 6 (six) hours as needed for wheezing or shortness of breath.     Tavaborole 5 % Soln  Commonly known as:  KERYDIN  Apply 1 drop to each affected toenail once daily for 12 months        Diagnostic Studies: Dg Pelvis Portable  01/23/2016  CLINICAL DATA:  Post right hip arthroplasty EXAM: PORTABLE PELVIS 1-2 VIEWS COMPARISON:  Report 06/19/2003. FINDINGS: Single frontal view of the pelvis submitted. There is right hip prosthesis with anatomic alignment. Left hip prosthesis in anatomic alignment. Atherosclerotic calcifications of femoral arteries. IMPRESSION: Right hip prosthesis with anatomic alignment. Left hip prosthesis with anatomic alignment. Electronically  Signed   By: Natasha Mead M.D.   On: 01/23/2016 10:49  Disposition: 01-Home or Self Care    Follow-up Information    Follow up with Loreta Ave, MD. Schedule an appointment as soon as possible for a visit in 2 weeks.   Specialty:  Orthopedic Surgery   Contact information:   [address] [place] [address] [phone]        Signed: Otilio Saber 01/24/2016, 6:34 AM

## 2016-01-23 NOTE — Interval H&P Note (Signed)
History and Physical Interval Note:  01/23/2016 8:26 AM  [name]  has presented today for surgery, with the diagnosis of DJD RIGHT HIP  The various methods of treatment have been discussed with the patient and family. After consideration of risks, benefits and other options for treatment, the patient has consented to  Procedure(s): RIGHT TOTAL HIP ARTHROPLASTY ANTERIOR APPROACH (Right) as a surgical intervention .  The patient's history has been reviewed, patient examined, no change in status, stable for surgery.  I have reviewed the patient's chart and labs.  Questions were answered to the patient's satisfaction.     Kristine [name]

## 2016-01-23 NOTE — Evaluation (Signed)
Physical Therapy Evaluation Patient Details Name: Kristine [name] MRN: [id number] DOB: 10-Oct-1941 Today's Date: 01/23/2016   History of Present Illness  75 y.o. female admitted to [facility] on 01/23/16 for elective R THA direct anterior approach.  Pt with significant PMHx of STEMI, HTN, PVD, COPD, stroke, urinary urgency and frequency, HOH, and L THA.  Clinical Impression  Pt is POD #0 and is moving well.  She is min assist overall to take some pivotal steps to [facility] and then to the recliner.  Pt will likely progress well enough to d/c home with family assist and HHPT.   PT to follow acutely for deficits listed below.       Follow Up Recommendations Home health PT;Supervision for mobility/OOB    Equipment Recommendations  None recommended by PT    Recommendations for Other Services   NA    Precautions / Restrictions Precautions Precautions: None Restrictions RLE Weight Bearing: Weight bearing as tolerated      Mobility  Bed Mobility Overal bed mobility: Needs Assistance Bed Mobility: Supine to Sit     Supine to sit: Min assist;HOB elevated     General bed mobility comments: Min assist to help progress right leg to EOB. Verbal cues for sequencing and hand placement.   Transfers Overall transfer level: Needs assistance Equipment used: Rolling walker (2 wheeled) Transfers: Sit to/from [employer] Sit to Stand: Min assist Stand pivot transfers: Min assist       General transfer comment: Min assist to support trunk over painful right leg and verbal cues for hand placement and LE sequencing.  RW adjusted to pt's height.   Ambulation/Gait Ambulation/Gait assistance: Min assist Ambulation Distance (Feet): 3 Feet (x2) Assistive device: Rolling walker (2 wheeled) Gait Pattern/deviations: Step-to pattern;Antalgic;Trunk flexed     General Gait Details: Verbal cues for LE sequencing, upright posture, breathing during gait.  Min assist to support trunk when WB through  right leg.          Balance Overall balance assessment: Needs assistance Sitting-balance support: Feet supported;No upper extremity supported Sitting balance-Leahy Scale: Good     Standing balance support: Bilateral upper extremity supported Standing balance-Leahy Scale: Poor                               Pertinent Vitals/Pain Pain Assessment: 0-10 Pain Score: 5  Pain Location: right hip Pain Descriptors / Indicators: Aching;Burning Pain Intervention(s): Limited activity within patient's tolerance;Monitored during session;Repositioned    Home Living Family/patient expects to be discharged to:: Private residence Living Arrangements: Spouse/significant other;Children;Other relatives Available Help at Discharge: Family;Available 24 hours/day Type of Home: House Home Access: Stairs to enter Entrance Stairs-Rails: None Entrance Stairs-Number of Steps: 2 Home Layout: Two level Home Equipment: Walker - 2 wheels;Bedside commode;Cane - single point      Prior Function Level of Independence: Independent with assistive device(s)         Comments: walked with cane     Hand Dominance   Dominant Hand: Right    Extremity/Trunk Assessment   Upper Extremity Assessment: Defer to OT evaluation           Lower Extremity Assessment: RLE deficits/detail RLE Deficits / Details: right leg with normal post op pain and weakness.  Pt with at least 3/5 ankle, 3-/5 knee, 2/5 hip    Cervical / Trunk Assessment: Normal  Communication   Communication: HOH  Cognition Arousal/Alertness: Lethargic;Suspect due to medications Behavior During Therapy:  WFL for tasks assessed/performed Overall Cognitive Status: Within Functional Limits for tasks assessed                               Assessment/Plan    PT Assessment Patient needs continued PT services  PT Diagnosis Difficulty walking;Abnormality of gait;Generalized weakness;Acute pain   PT Problem List  Decreased strength;Decreased range of motion;Decreased activity tolerance;Decreased balance;Decreased mobility;Decreased knowledge of use of DME;Pain  PT Treatment Interventions DME instruction;Gait training;Stair training;Functional mobility training;Therapeutic activities;Therapeutic exercise;Balance training;Neuromuscular re-education;Patient/family education;Manual techniques;Modalities   PT Goals (Current goals can be found in the Care Plan section) Acute Rehab PT Goals Patient Stated Goal: to go home at discharge PT Goal Formulation: With patient Time For Goal Achievement: 01/30/16 Potential to Achieve Goals: Good    Frequency 7X/week    End of Session Equipment Utilized During Treatment: Gait belt Activity Tolerance: Patient limited by pain;Patient limited by lethargy Patient left: in chair;with call bell/phone within reach;with family/visitor present Nurse Communication: Mobility status        Time: 1650-1720 PT Time Calculation (min) (ACUTE ONLY): 30 min   Charges:   PT Evaluation $PT Eval Moderate Complexity: 1 Procedure PT Treatments $Therapeutic Activity: 8-22 mins        Jacqulin Brandenburger B. Nico Rogness, PT, DPT [phone]   01/23/2016, 5:33 PM

## 2016-01-23 NOTE — Progress Notes (Signed)
Utilization review completed.  

## 2016-01-24 ENCOUNTER — Encounter (HOSPITAL_COMMUNITY): Payer: Self-pay | Admitting: Orthopedic Surgery

## 2016-01-24 LAB — BASIC METABOLIC PANEL
Anion gap: 9 (ref 5–15)
BUN: 10 mg/dL (ref 6–20)
CALCIUM: 8.5 mg/dL — AB (ref 8.9–10.3)
CO2: 24 mmol/L (ref 22–32)
CREATININE: 0.63 mg/dL (ref 0.44–1.00)
Chloride: 106 mmol/L (ref 101–111)
GFR calc non Af Amer: >60 mL/min (ref >60)
Glucose, Bld: 116 mg/dL — ABNORMAL HIGH (ref 65–99)
Potassium: 4.1 mmol/L (ref 3.5–5.1)
SODIUM: 139 mmol/L (ref 135–145)

## 2016-01-24 LAB — CBC
HCT: 30 % — ABNORMAL LOW (ref 36.0–46.0)
Hemoglobin: 9.9 g/dL — ABNORMAL LOW (ref 12.0–15.0)
MCH: 29.1 pg (ref 26.0–34.0)
MCHC: 33 g/dL (ref 30.0–36.0)
MCV: 88.2 fL (ref 78.0–100.0)
Platelets: 275 10*3/uL (ref 150–400)
RBC: 3.4 MIL/uL — ABNORMAL LOW (ref 3.87–5.11)
RDW: 15.6 % — AB (ref 11.5–15.5)
WBC: 4.7 10*3/uL (ref 4.0–10.5)

## 2016-01-24 NOTE — Progress Notes (Signed)
Occupational Therapy Treatment Patient Details Name: Kristine [name] MRN: [id number] DOB: 10-14-1941 Today's Date: 01/24/2016    History of present illness 75 y.o. female admitted to [facility] on 01/23/16 for elective R THA direct anterior approach.  Pt with significant PMHx of STEMI, HTN, PVD, COPD, stroke, urinary urgency and frequency, HOH, and L THA.   OT comments  Completed education on use of AE and safe bathroom management with grand daughter. OT goals met. OT signing off. Pt to D/C home this pm.  Follow Up Recommendations  No OT follow up;Supervision/Assistance - 24 hour    Equipment Recommendations  None recommended by OT    Recommendations for Other Services      Precautions / Restrictions Precautions Precautions: None Restrictions RLE Weight Bearing: Weight bearing as tolerated       Mobility Bed Mobility Overal bed mobility: Needs Assistance Bed Mobility: Supine to Sit     Supine to sit: Min assist     General bed mobility comments: OOB in chair  Transfers Overall transfer level: Needs assistance Equipment used: Rolling walker (2 wheeled) Transfers: Sit to/from Stand Sit to Stand: Supervision Stand pivot transfers: Supervision       General transfer comment: good safety during session    Balance Overall balance assessment: Needs assistance Sitting-balance support: Feet supported;No upper extremity supported Sitting balance-Leahy Scale: Good     Standing balance support: Bilateral upper extremity supported;Single extremity supported;No upper extremity supported Standing balance-Leahy Scale: Fair                     ADL                                         General ADL Comments: Educated pt/grand daughter regarding use of AE. Pt able to return demonstrate. [name] able to demonstrate ability to safely assist her grand mother with toilet tranfers.       Vision                     Perception     Praxis       Cognition   Behavior During Therapy: WFL for tasks assessed/performed Overall Cognitive Status: Within Functional Limits for tasks assessed                                               General Comments      Pertinent Vitals/ Pain       Pain Assessment: Faces Pain Score: 10-Worst pain ever (at times up to "15" with movement of the right leg) Faces Pain Scale: Hurts a little bit Pain Location: R hip Pain Descriptors / Indicators: Discomfort Pain Intervention(s): Limited activity within patient's tolerance  Home Living                                          Prior Functioning/Environment              Frequency Min 2X/week     Progress Toward Goals  OT Goals(current goals can now be found in the care plan section)  Progress towards OT goals: Goals met/education completed, patient discharged from OT  Acute Rehab OT Goals Patient Stated Goal: to be independent OT Goal Formulation: With patient Time For Goal Achievement: 01/31/16 Potential to Achieve Goals: Good ADL Goals Pt Will Perform Lower Body Dressing: with supervision;with set-up;with caregiver independent in assisting;sit to/from stand;with adaptive equipment  Plan Discharge plan remains appropriate    Co-evaluation                 End of Session Equipment Utilized During Treatment: Rolling walker;Gait belt   Activity Tolerance Patient tolerated treatment well   Patient Left in chair;with call bell/phone within reach;with family/visitor present   Nurse Communication Mobility status        Time: 7672-0947 OT Time Calculation (min): 15 min  Charges: OT General Charges $OT Visit: 1 Procedure OT Treatments $Therapeutic Activity: 8-22 mins  Jaslen Adcox,HILLARY 01/24/2016, 3:07 PM   [facility], OTR/L  [phone] 01/24/2016

## 2016-01-24 NOTE — Progress Notes (Signed)
**Note De-Identified Kristine Obfuscation** Occupational Therapy Evaluation Patient Details Name: Kristine [name] MRN: [id number] DOB: 07-29-1941 Today's Date: 01/24/2016    History of Present Illness 75 y.o. female admitted to [facility] on 01/23/16 for elective R THA direct anterior approach.  Pt with significant PMHx of STEMI, HTN, PVD, COPD, stroke, urinary urgency and frequency, HOH, and L THA.   Clinical Impression   PTA, pt mod I with mobility and ADL. Pt requires assistance with LB ADL. Will return this pm to copmlete education prior to D/C. Pt requesting rollator and states she discussed this with "someone at the doctor's office". Pt would benefit from rollator. Discussed with case Production designer, theatre/television/film.     Follow Up Recommendations  No OT follow up;Supervision/Assistance - 24 hour (initially)    Equipment Recommendations  Other (comment) (rollator)    Recommendations for Other Services       Precautions / Restrictions Precautions Precautions: None Restrictions RLE Weight Bearing: Weight bearing as tolerated      Mobility Bed Mobility Overal bed mobility: Needs Assistance Bed Mobility: Supine to Sit     Supine to sit: Supervision;HOB elevated     General bed mobility comments: Educated on using sheet to assist with moving leg off bed  Transfers Overall transfer level: Needs assistance Equipment used: Rolling walker (2 wheeled)   Sit to Stand: Supervision Stand pivot transfers: Supervision       General transfer comment: vc for safety    Balance Overall balance assessment: Needs assistance   Sitting balance-Leahy Scale: Good       Standing balance-Leahy Scale: Fair                              ADL Overall ADL's : Needs assistance/impaired     Grooming: Set up;Standing   Upper Body Bathing: Set up   Lower Body Bathing: Minimal assistance;Sit to/from stand   Upper Body Dressing : Set up;Sitting   Lower Body Dressing: Minimal assistance;Sit to/from stand   Toilet Transfer:  Supervision/safety;RW;BSC (over toilet)   Toileting- Clothing Manipulation and Hygiene: Modified independent       Functional mobility during ADLs: Supervision/safety;Rolling walker General ADL Comments: Pt states she had difficulty with LB dressing prior to surgery. Will educate t on use of sock aid. Pt has reacher she can use for LB dressing. Educated on technique,      Advice worker      Pertinent Vitals/Pain Pain Assessment: 0-10 Pain Score: 3  Pain Location:  rhip Pain Descriptors / Indicators: Discomfort Pain Intervention(s): Limited activity within patient's tolerance     Hand Dominance Right   Extremity/Trunk Assessment Upper Extremity Assessment Upper Extremity Assessment: Overall WFL for tasks assessed   Lower Extremity Assessment Lower Extremity Assessment: Defer to PT evaluation RLE Deficits / Details: right leg with normal post op pain and weakness.  Pt with at least 3/5 ankle, 3-/5 knee, 2/5 hip   Cervical / Trunk Assessment Cervical / Trunk Assessment: Normal   Communication Communication Communication: HOH   Cognition Arousal/Alertness: Lethargic;Suspect due to medications Behavior During Therapy: [facility] for tasks assessed/performed Overall Cognitive Status: Within Functional Limits for tasks assessed                     General Comments       Exercises       Shoulder Instructions      Home Living Family/patient expects to be discharged  to:: Private residence Living Arrangements: Spouse/significant other;Children;Other relatives Available Help at Discharge: Family;Available 24 hours/day Type of Home: House Home Access: Stairs to enter [employer] of Steps: 2 Entrance Stairs-Rails: None Home Layout: Two level Alternate Level Stairs-Number of Steps: flight   Bathroom Shower/Tub: Other (comment) (pt reports she takes a sponge bath)   Bathroom Toilet: Standard Bathroom Accessibility: Yes How  Accessible: Accessible Kristine walker Home Equipment: Walker - 2 wheels;Bedside commode;Cane - single point          Prior Functioning/Environment Level of Independence: Independent with assistive device(s)        Comments: walked with cane    OT Diagnosis: Generalized weakness;Acute pain   OT Problem List: Decreased strength;Decreased range of motion;Decreased activity tolerance;Impaired balance (sitting and/or standing);Decreased safety awareness;Decreased knowledge of use of DME or AE;Pain   OT Treatment/Interventions: Self-care/ADL training;DME and/or AE instruction;Therapeutic activities;Patient/family education    OT Goals(Current goals can be found in the care plan section) Acute Rehab OT Goals Patient Stated Goal: to be independent OT Goal Formulation: With patient Time For Goal Achievement: 01/31/16 Potential to Achieve Goals: Good  OT Frequency: Min 2X/week   Barriers to D/C:            Co-evaluation              End of Session Equipment Utilized During Treatment: Gait belt;Rolling walker Nurse Communication: Mobility status  Activity Tolerance: Patient tolerated treatment well Patient left: in chair;with call bell/phone within reach;with family/visitor present   Time: 4098-1191 OT Time Calculation (min): 35 min Charges:  OT General Charges $OT Visit: 1 Procedure OT Evaluation $OT Eval Moderate Complexity: 1 Procedure OT Treatments $Self Care/Home Management : 8-22 mins G-Codes:    Abdullah Rizzi,HILLARY 2016/02/01, 10:13 AM   Luisa Dago, OTR/L  [phone] 2016/02/01

## 2016-01-24 NOTE — Progress Notes (Signed)
Physical Therapy Treatment Patient Details Name: Kristine [name] MRN: [id number] DOB: Apr 10, 1941 Today's Date: 01/24/2016    History of Present Illness 75 y.o. female admitted to [facility] on 01/23/16 for elective R THA direct anterior approach.  Pt with significant PMHx of STEMI, HTN, PVD, COPD, stroke, urinary urgency and frequency, HOH, and L THA.    PT Comments    Pt is POD #1 and this is her second session.  She was able to demonstrate better ability on the stairs, but still needed verbal cues to reinforce correct LE sequencing.  Granddaughter present and able to help her remember when she goes home.  Pt plans to d/c home later this PM.  PT will follow acutely until d/c is confirmed.    Follow Up Recommendations  Home health PT;Supervision for mobility/OOB     Equipment Recommendations  None recommended by PT    Recommendations for Other Services   NA     Precautions / Restrictions Precautions Precautions: None Restrictions RLE Weight Bearing: Weight bearing as tolerated    Mobility  Bed Mobility Overal bed mobility: Needs Assistance Bed Mobility: Supine to Sit     Supine to sit: Min assist     General bed mobility comments: Min assist to help progress right leg to EOB.   Transfers Overall transfer level: Needs assistance Equipment used: Rolling walker (2 wheeled) Transfers: Sit to/from Stand Sit to Stand: Min guard         General transfer comment: Min guard assist for safety  Ambulation/Gait Ambulation/Gait assistance: Supervision Ambulation Distance (Feet): 90 Feet Assistive device: Rolling walker (2 wheeled) Gait Pattern/deviations: Step-through pattern;Antalgic Gait velocity: decreased Gait velocity interpretation: Below normal speed for age/gender General Gait Details: mildly antalgic gait pattern, verbal cues for upright posture and safe proximity to RW   Stairs Stairs: Yes Stairs assistance: Min assist Stair Management: No rails;Backwards;With  walker Number of Stairs: 2 (x2 trials) General stair comments: Educated pt and her granddaughter on stairs backwards with RW. Verbal cues for correct LE sequencing.           Balance Overall balance assessment: Needs assistance Sitting-balance support: Feet supported;No upper extremity supported Sitting balance-Leahy Scale: Good     Standing balance support: Bilateral upper extremity supported;Single extremity supported;No upper extremity supported Standing balance-Leahy Scale: Fair                      Cognition Arousal/Alertness: Awake/alert Behavior During Therapy: WFL for tasks assessed/performed Overall Cognitive Status: Within Functional Limits for tasks assessed                      Exercises Total Joint Exercises Ankle Circles/Pumps: AROM;Both;10 reps Quad Sets: AROM;Right;10 reps Heel Slides: AAROM;Right;10 reps Hip ABduction/ADduction: AAROM;Right;10 reps        Pertinent Vitals/Pain Pain Assessment: Faces Pain Score: 10-Worst pain ever (at times up to "15" with movement of the right leg) Faces Pain Scale: Hurts little more Pain Location: right hip Pain Descriptors / Indicators: Aching;Burning;Grimacing;Guarding Pain Intervention(s): Limited activity within patient's tolerance;Monitored during session;Repositioned;Ice applied           PT Goals (current goals can now be found in the care plan section) Acute Rehab PT Goals Patient Stated Goal: to be independent Progress towards PT goals: Progressing toward goals    Frequency  7X/week    PT Plan Current plan remains appropriate       End of Session Equipment Utilized During Treatment: Gait belt Activity  Tolerance: Patient tolerated treatment well Patient left: in bed;with call bell/phone within reach;with family/visitor present     Time: 1610-9604 PT Time Calculation (min) (ACUTE ONLY): 23 min  Charges:  $Gait Training: 23-37 mins $Therapeutic Exercise: 8-22 mins                       Arnez Stoneking B. Neaveh Belanger, PT, DPT [phone]   01/24/2016, 2:53 PM

## 2016-01-24 NOTE — Progress Notes (Signed)
Pt discharge education and instructions completed with pt and family. All voices understanding and denies any questions. Pt discharge home with family to transport her home. Pt IV removed; pt handed her prescriptions for Morphine, zofran, dulcolax and eliquis. Pt hip incision dsg remains, clean, dry and intact. Pt home need equipments delivered to pt at bedside. Pt transported off unit via wheelchair with belongings and family to the side. Dionne Bucy RN

## 2016-01-24 NOTE — Care Management Note (Signed)
Case Management Note  Patient Details  Name: Kristine [name] MRN: [id number] Date of Birth: 03-19-1941  Subjective/Objective:         S/p right total hip arthroplasty           Action/Plan: Set up with [facility] for HHPT by MD office.Spoke with patient, no change in discharge plan. Patient stated that her husband, daughter and other family will be able to assist her after discharge. She stated that she has a rolling walker at home. Contacted James with [facility] and requested 3N1 be delivered to patient's room.          Expected Discharge Date:                  Expected Discharge Plan:  Home w Home Health Services  In-House Referral:  NA  Discharge planning Services  CM Consult  Post Acute Care Choice:  Home Health, Durable Medical Equipment Choice offered to:  Patient  DME Arranged:  3-N-1 DME Agency:  [facility]  HH Arranged:  PT HH Agency:  [facility]  Status of Service:  Completed, signed off  Medicare Important Message Given:    Date Medicare IM Given:    Medicare IM give by:    Date Additional Medicare IM Given:    Additional Medicare Important Message give by:     If discussed at Long Length of Stay Meetings, dates discussed:    Additional Comments:  Monica Becton, RN 01/24/2016, 10:26 AM

## 2016-01-24 NOTE — Progress Notes (Signed)
Physical Therapy Treatment Patient Details Name: Kristine [name] MRN: [id number] DOB: [birth year] Today's Date: 01/24/2016    History of Present Illness 75 y.o. female admitted to [facility] on 01/23/16 for elective R THA direct anterior approach.  Pt with significant PMHx of STEMI, HTN, PVD, COPD, stroke, urinary urgency and frequency, HOH, and L THA.    PT Comments    Pt is POD #1 and is progressing well with mobility into the hallway with RW.  She was able to practice stairs, but would benefit from reinforcement before she leaves this afternoon.  Exercise handout and stair education handout given.   Follow Up Recommendations  Home health PT;Supervision for mobility/OOB     Equipment Recommendations  None recommended by PT    Recommendations for Other Services   NA     Precautions / Restrictions Precautions Precautions: None Restrictions RLE Weight Bearing: Weight bearing as tolerated    Mobility  Bed Mobility Overal bed mobility: Needs Assistance Bed Mobility: Supine to Sit     Supine to sit: Supervision;HOB elevated     General bed mobility comments: Educated on using sheet to assist with moving leg off bed  Transfers Overall transfer level: Needs assistance Equipment used: Rolling walker (2 wheeled) Transfers: Sit to/from Stand Sit to Stand: Min assist Stand pivot transfers: Supervision       General transfer comment: Min assist from low recliner chair.  Verbal cues to scoot closer to the edge when going to standing next time.  Verbal cues for safe hand placement.  Uncontrolled descent to sit.  Pt needed assist at trunk to power up to standing.   Ambulation/Gait Ambulation/Gait assistance: Min assist Ambulation Distance (Feet): 180 Feet Assistive device: Rolling walker (2 wheeled) Gait Pattern/deviations: Step-through pattern;Antalgic;Trunk flexed Gait velocity: decreased Gait velocity interpretation: Below normal speed for age/gender General Gait Details: Min  assist to support trunk for balance during gait.  Verbal cues for upright posture and for correct LE sequencing.    Stairs Stairs: Yes Stairs assistance: Min assist Stair Management: No rails;Step to pattern;Backwards;With walker Number of Stairs: 2 General stair comments: Educated pt and her grandson in reverse technique with RW.  Min assist to support trunk and stabilize RW while stepping up and down.  Verbal cues for correct LE sequencing.  [place] given for reinforcement.          Balance Overall balance assessment: Needs assistance Sitting-balance support: Feet supported;No upper extremity supported Sitting balance-Leahy Scale: Good     Standing balance support: Bilateral upper extremity supported;No upper extremity supported;Single extremity supported Standing balance-Leahy Scale: Fair                      Cognition Arousal/Alertness: Awake/alert Behavior During Therapy: WFL for tasks assessed/performed Overall Cognitive Status: Within Functional Limits for tasks assessed                      Exercises Total Joint Exercises Ankle Circles/Pumps: AROM;Both;10 reps Quad Sets: AROM;Right;10 reps Heel Slides: AAROM;Right;10 reps Hip ABduction/ADduction: AAROM;Right;10 reps        Pertinent Vitals/Pain Pain Assessment: 0-10 Pain Score: 10-Worst pain ever (at times up to "15" with movement of the right leg) Pain Location: right hip and thigh Pain Descriptors / Indicators: Aching;Burning;Guarding;Grimacing Pain Intervention(s): Limited activity within patient's tolerance;Monitored during session;Repositioned;Ice applied    Home Living Family/patient expects to be discharged to:: Private residence Living Arrangements: Spouse/significant other;Children;Other relatives Available Help at Discharge: Family;Available 24 hours/day Type of  Home: House Home Access: Stairs to enter Entrance Stairs-Rails: None Home Layout: Two level Home Equipment: Environmental consultant - 2  wheels;Bedside commode;Cane - single point      Prior Function Level of Independence: Independent with assistive device(s)      Comments: walked with cane   PT Goals (current goals can now be found in the care plan section) Acute Rehab PT Goals Patient Stated Goal: to be independent Progress towards PT goals: Progressing toward goals    Frequency  7X/week    PT Plan Current plan remains appropriate       End of Session Equipment Utilized During Treatment: Gait belt Activity Tolerance: Patient limited by pain Patient left: in chair;with call bell/phone within reach;with family/visitor present     Time: 1050-1120 PT Time Calculation (min) (ACUTE ONLY): 30 min  Charges:  $Gait Training: 8-22 mins $Therapeutic Exercise: 8-22 mins                      Kristine [name] B. Braxton Weisbecker, PT, DPT [phone]   01/24/2016, 11:28 AM

## 2016-01-24 NOTE — Progress Notes (Signed)
Subjective: 1 Day Post-Op Procedure(s) (LRB): RIGHT TOTAL HIP ARTHROPLASTY ANTERIOR APPROACH (Right) Patient reports pain as mild.  No nausea/vomiting, lightheadedness/dizziness, chest pain/sob.  Tolerating diet.  Objective: Vital signs in last 24 hours: Temp:  [97.4 F (36.3 C)-98.7 F (37.1 C)] 98.2 F (36.8 C) (02/16 0500) Pulse Rate:  [58-72] 72 (02/16 0500) Resp:  [4-20] 18 (02/16 0500) BP: (100-146)/(51-89) 100/51 mmHg (02/16 0500) SpO2:  [99 %-100 %] 100 % (02/16 0500)  Intake/Output from previous day: 02/15 0701 - 02/16 0700 In: 1060 [P.O.:360; I.V.:700] Out: 500 [Urine:200; Blood:300] Intake/Output this shift: Total I/O In: 360 [P.O.:360] Out: 200 [Urine:200]  No results for input(s): HGB in the last 72 hours. No results for input(s): WBC, RBC, HCT, PLT in the last 72 hours. No results for input(s): NA, K, CL, CO2, BUN, CREATININE, GLUCOSE, CALCIUM in the last 72 hours. No results for input(s): LABPT, INR in the last 72 hours.  Neurologically intact Neurovascular intact Sensation intact distally Intact pulses distally Dorsiflexion/Plantar flexion intact Compartment soft  Assessment/Plan: 1 Day Post-Op Procedure(s) (LRB): RIGHT TOTAL HIP ARTHROPLASTY ANTERIOR APPROACH (Right) Advance diet Up with therapy Discharge home with home health as long as she progresses with PT today WBAT RLE-anterior hip precautions Dry dressing change prn  Otilio Saber 01/24/2016, 6:39 AM

## 2016-01-25 DIAGNOSIS — M4800 Spinal stenosis, site unspecified: Secondary | ICD-10-CM | POA: Diagnosis not present

## 2016-01-25 DIAGNOSIS — I2510 Atherosclerotic heart disease of native coronary artery without angina pectoris: Secondary | ICD-10-CM | POA: Diagnosis not present

## 2016-01-25 DIAGNOSIS — Z8673 Personal history of transient ischemic attack (TIA), and cerebral infarction without residual deficits: Secondary | ICD-10-CM | POA: Diagnosis not present

## 2016-01-25 DIAGNOSIS — E785 Hyperlipidemia, unspecified: Secondary | ICD-10-CM | POA: Diagnosis not present

## 2016-01-25 DIAGNOSIS — Z96641 Presence of right artificial hip joint: Secondary | ICD-10-CM | POA: Diagnosis not present

## 2016-01-25 DIAGNOSIS — I252 Old myocardial infarction: Secondary | ICD-10-CM | POA: Diagnosis not present

## 2016-01-25 DIAGNOSIS — J449 Chronic obstructive pulmonary disease, unspecified: Secondary | ICD-10-CM | POA: Diagnosis not present

## 2016-01-25 DIAGNOSIS — Z87891 Personal history of nicotine dependence: Secondary | ICD-10-CM | POA: Diagnosis not present

## 2016-01-25 DIAGNOSIS — Z471 Aftercare following joint replacement surgery: Secondary | ICD-10-CM | POA: Diagnosis not present

## 2016-01-25 DIAGNOSIS — I739 Peripheral vascular disease, unspecified: Secondary | ICD-10-CM | POA: Diagnosis not present

## 2016-01-26 DIAGNOSIS — M4800 Spinal stenosis, site unspecified: Secondary | ICD-10-CM | POA: Diagnosis not present

## 2016-01-26 DIAGNOSIS — Z471 Aftercare following joint replacement surgery: Secondary | ICD-10-CM | POA: Diagnosis not present

## 2016-01-26 DIAGNOSIS — J449 Chronic obstructive pulmonary disease, unspecified: Secondary | ICD-10-CM | POA: Diagnosis not present

## 2016-01-26 DIAGNOSIS — Z96641 Presence of right artificial hip joint: Secondary | ICD-10-CM | POA: Diagnosis not present

## 2016-01-26 DIAGNOSIS — I2510 Atherosclerotic heart disease of native coronary artery without angina pectoris: Secondary | ICD-10-CM | POA: Diagnosis not present

## 2016-01-26 DIAGNOSIS — I739 Peripheral vascular disease, unspecified: Secondary | ICD-10-CM | POA: Diagnosis not present

## 2016-01-28 DIAGNOSIS — Z96641 Presence of right artificial hip joint: Secondary | ICD-10-CM | POA: Diagnosis not present

## 2016-01-28 DIAGNOSIS — I739 Peripheral vascular disease, unspecified: Secondary | ICD-10-CM | POA: Diagnosis not present

## 2016-01-28 DIAGNOSIS — I2510 Atherosclerotic heart disease of native coronary artery without angina pectoris: Secondary | ICD-10-CM | POA: Diagnosis not present

## 2016-01-28 DIAGNOSIS — J449 Chronic obstructive pulmonary disease, unspecified: Secondary | ICD-10-CM | POA: Diagnosis not present

## 2016-01-28 DIAGNOSIS — M4800 Spinal stenosis, site unspecified: Secondary | ICD-10-CM | POA: Diagnosis not present

## 2016-01-28 DIAGNOSIS — Z471 Aftercare following joint replacement surgery: Secondary | ICD-10-CM | POA: Diagnosis not present

## 2016-01-30 DIAGNOSIS — M4800 Spinal stenosis, site unspecified: Secondary | ICD-10-CM | POA: Diagnosis not present

## 2016-01-30 DIAGNOSIS — J449 Chronic obstructive pulmonary disease, unspecified: Secondary | ICD-10-CM | POA: Diagnosis not present

## 2016-01-30 DIAGNOSIS — I2510 Atherosclerotic heart disease of native coronary artery without angina pectoris: Secondary | ICD-10-CM | POA: Diagnosis not present

## 2016-01-30 DIAGNOSIS — Z96641 Presence of right artificial hip joint: Secondary | ICD-10-CM | POA: Diagnosis not present

## 2016-01-30 DIAGNOSIS — I739 Peripheral vascular disease, unspecified: Secondary | ICD-10-CM | POA: Diagnosis not present

## 2016-01-30 DIAGNOSIS — Z471 Aftercare following joint replacement surgery: Secondary | ICD-10-CM | POA: Diagnosis not present

## 2016-01-31 DIAGNOSIS — I2510 Atherosclerotic heart disease of native coronary artery without angina pectoris: Secondary | ICD-10-CM | POA: Diagnosis not present

## 2016-01-31 DIAGNOSIS — J449 Chronic obstructive pulmonary disease, unspecified: Secondary | ICD-10-CM | POA: Diagnosis not present

## 2016-01-31 DIAGNOSIS — M4800 Spinal stenosis, site unspecified: Secondary | ICD-10-CM | POA: Diagnosis not present

## 2016-01-31 DIAGNOSIS — Z471 Aftercare following joint replacement surgery: Secondary | ICD-10-CM | POA: Diagnosis not present

## 2016-01-31 DIAGNOSIS — I739 Peripheral vascular disease, unspecified: Secondary | ICD-10-CM | POA: Diagnosis not present

## 2016-01-31 DIAGNOSIS — Z96641 Presence of right artificial hip joint: Secondary | ICD-10-CM | POA: Diagnosis not present

## 2016-02-05 DIAGNOSIS — M1611 Unilateral primary osteoarthritis, right hip: Secondary | ICD-10-CM | POA: Diagnosis not present

## 2016-02-05 DIAGNOSIS — M25551 Pain in right hip: Secondary | ICD-10-CM | POA: Diagnosis not present

## 2016-02-05 DIAGNOSIS — R262 Difficulty in walking, not elsewhere classified: Secondary | ICD-10-CM | POA: Diagnosis not present

## 2016-02-05 DIAGNOSIS — Z96641 Presence of right artificial hip joint: Secondary | ICD-10-CM | POA: Diagnosis not present

## 2016-02-05 DIAGNOSIS — M6281 Muscle weakness (generalized): Secondary | ICD-10-CM | POA: Diagnosis not present

## 2016-02-08 DIAGNOSIS — M6281 Muscle weakness (generalized): Secondary | ICD-10-CM | POA: Diagnosis not present

## 2016-02-08 DIAGNOSIS — M25551 Pain in right hip: Secondary | ICD-10-CM | POA: Diagnosis not present

## 2016-02-08 DIAGNOSIS — Z96641 Presence of right artificial hip joint: Secondary | ICD-10-CM | POA: Diagnosis not present

## 2016-02-08 DIAGNOSIS — R262 Difficulty in walking, not elsewhere classified: Secondary | ICD-10-CM | POA: Diagnosis not present

## 2016-02-13 DIAGNOSIS — M6281 Muscle weakness (generalized): Secondary | ICD-10-CM | POA: Diagnosis not present

## 2016-02-13 DIAGNOSIS — R262 Difficulty in walking, not elsewhere classified: Secondary | ICD-10-CM | POA: Diagnosis not present

## 2016-02-13 DIAGNOSIS — Z96641 Presence of right artificial hip joint: Secondary | ICD-10-CM | POA: Diagnosis not present

## 2016-02-13 DIAGNOSIS — M25551 Pain in right hip: Secondary | ICD-10-CM | POA: Diagnosis not present

## 2016-02-14 ENCOUNTER — Ambulatory Visit: Payer: Medicare Other | Admitting: Podiatry

## 2016-02-15 DIAGNOSIS — R262 Difficulty in walking, not elsewhere classified: Secondary | ICD-10-CM | POA: Diagnosis not present

## 2016-02-15 DIAGNOSIS — M6281 Muscle weakness (generalized): Secondary | ICD-10-CM | POA: Diagnosis not present

## 2016-02-15 DIAGNOSIS — Z96641 Presence of right artificial hip joint: Secondary | ICD-10-CM | POA: Diagnosis not present

## 2016-02-15 DIAGNOSIS — M25551 Pain in right hip: Secondary | ICD-10-CM | POA: Diagnosis not present

## 2016-02-19 DIAGNOSIS — M6281 Muscle weakness (generalized): Secondary | ICD-10-CM | POA: Diagnosis not present

## 2016-02-19 DIAGNOSIS — M25551 Pain in right hip: Secondary | ICD-10-CM | POA: Diagnosis not present

## 2016-02-19 DIAGNOSIS — Z96641 Presence of right artificial hip joint: Secondary | ICD-10-CM | POA: Diagnosis not present

## 2016-02-19 DIAGNOSIS — R262 Difficulty in walking, not elsewhere classified: Secondary | ICD-10-CM | POA: Diagnosis not present

## 2016-02-21 DIAGNOSIS — Z96641 Presence of right artificial hip joint: Secondary | ICD-10-CM | POA: Diagnosis not present

## 2016-02-21 DIAGNOSIS — M6281 Muscle weakness (generalized): Secondary | ICD-10-CM | POA: Diagnosis not present

## 2016-02-21 DIAGNOSIS — M25551 Pain in right hip: Secondary | ICD-10-CM | POA: Diagnosis not present

## 2016-02-21 DIAGNOSIS — R262 Difficulty in walking, not elsewhere classified: Secondary | ICD-10-CM | POA: Diagnosis not present

## 2016-02-26 DIAGNOSIS — M25551 Pain in right hip: Secondary | ICD-10-CM | POA: Diagnosis not present

## 2016-02-26 DIAGNOSIS — R262 Difficulty in walking, not elsewhere classified: Secondary | ICD-10-CM | POA: Diagnosis not present

## 2016-02-26 DIAGNOSIS — M6281 Muscle weakness (generalized): Secondary | ICD-10-CM | POA: Diagnosis not present

## 2016-02-26 DIAGNOSIS — Z96641 Presence of right artificial hip joint: Secondary | ICD-10-CM | POA: Diagnosis not present

## 2016-02-28 DIAGNOSIS — R262 Difficulty in walking, not elsewhere classified: Secondary | ICD-10-CM | POA: Diagnosis not present

## 2016-02-28 DIAGNOSIS — Z96641 Presence of right artificial hip joint: Secondary | ICD-10-CM | POA: Diagnosis not present

## 2016-02-28 DIAGNOSIS — M25551 Pain in right hip: Secondary | ICD-10-CM | POA: Diagnosis not present

## 2016-02-28 DIAGNOSIS — M6281 Muscle weakness (generalized): Secondary | ICD-10-CM | POA: Diagnosis not present

## 2016-03-04 ENCOUNTER — Other Ambulatory Visit (HOSPITAL_COMMUNITY): Payer: Self-pay | Admitting: Orthopedic Surgery

## 2016-03-04 ENCOUNTER — Ambulatory Visit (HOSPITAL_COMMUNITY)
Admission: RE | Admit: 2016-03-04 | Discharge: 2016-03-04 | Disposition: A | Discharge status: Home / Self Care | Payer: Medicare Other | Source: Ambulatory Visit | Attending: Internal Medicine | Admitting: Internal Medicine

## 2016-03-04 DIAGNOSIS — E785 Hyperlipidemia, unspecified: Secondary | ICD-10-CM | POA: Insufficient documentation

## 2016-03-04 DIAGNOSIS — M79604 Pain in right leg: Secondary | ICD-10-CM

## 2016-03-04 DIAGNOSIS — M7989 Other specified soft tissue disorders: Secondary | ICD-10-CM | POA: Diagnosis not present

## 2016-03-04 DIAGNOSIS — I10 Essential (primary) hypertension: Secondary | ICD-10-CM | POA: Diagnosis not present

## 2016-03-04 DIAGNOSIS — Z96641 Presence of right artificial hip joint: Secondary | ICD-10-CM | POA: Diagnosis not present

## 2016-03-04 DIAGNOSIS — I739 Peripheral vascular disease, unspecified: Secondary | ICD-10-CM | POA: Diagnosis not present

## 2016-03-06 DIAGNOSIS — R262 Difficulty in walking, not elsewhere classified: Secondary | ICD-10-CM | POA: Diagnosis not present

## 2016-03-06 DIAGNOSIS — M25551 Pain in right hip: Secondary | ICD-10-CM | POA: Diagnosis not present

## 2016-03-06 DIAGNOSIS — Z96641 Presence of right artificial hip joint: Secondary | ICD-10-CM | POA: Diagnosis not present

## 2016-03-06 DIAGNOSIS — M6281 Muscle weakness (generalized): Secondary | ICD-10-CM | POA: Diagnosis not present

## 2016-03-11 DIAGNOSIS — Z96641 Presence of right artificial hip joint: Secondary | ICD-10-CM | POA: Diagnosis not present

## 2016-03-11 DIAGNOSIS — M25551 Pain in right hip: Secondary | ICD-10-CM | POA: Diagnosis not present

## 2016-03-11 DIAGNOSIS — M6281 Muscle weakness (generalized): Secondary | ICD-10-CM | POA: Diagnosis not present

## 2016-03-11 DIAGNOSIS — R262 Difficulty in walking, not elsewhere classified: Secondary | ICD-10-CM | POA: Diagnosis not present

## 2016-03-12 ENCOUNTER — Encounter: Payer: Self-pay | Admitting: Podiatry

## 2016-03-12 ENCOUNTER — Ambulatory Visit (INDEPENDENT_AMBULATORY_CARE_PROVIDER_SITE_OTHER): Payer: Medicare Other | Admitting: Podiatry

## 2016-03-12 DIAGNOSIS — Q828 Other specified congenital malformations of skin: Secondary | ICD-10-CM

## 2016-03-12 NOTE — Progress Notes (Signed)
Subjective:     Patient ID: Kristine [name], female   DOB: 22-Dec-1940, 75 y.o.   MRN: [id number]  HPI this patient presents the office with chief complaint of painful calluses on the bottom of her left foot. She states that she has had hip surgery and has come been healing from that surgery. She states that her foot is painful walking and wearing shoes. She presents for preventative foot care services     Review of Systems     Objective:   Physical Exam GENERAL APPEARANCE: Alert, conversant. Appropriately groomed. No acute distress.  VASCULAR: Pedal pulses are  palpable at  [facility].  Capillary refill time is immediate to all digits,  Normal temperature gradient.  Digital hair growth is present bilateral  NEUROLOGIC: sensation is normal to 5.07 monofilament at 5/5 sites bilateral.  Light touch is intact bilateral, Muscle strength normal.  MUSCULOSKELETAL: acceptable muscle strength, tone and stability bilateral.  Intrinsic muscluature intact bilateral.  Rectus appearance of foot and digits noted bilateral.   DERMATOLOGIC: skin color, texture, and turgor are within normal limits.  No preulcerative lesions or ulcers  are seen, no interdigital maceration noted.  No open lesions present.   No drainage noted.Porokeratosis sub 3,4 left foot.  [place] durum fifth toe left foot.  Porokeratosis lateral aspect fifth metabase left foot.  NAILS  Thick disfigured discolored nails both feet.      Assessment:     Porokeratosis left foot.    Plan:     ROV  Debridement of porokeratosis left foot.  RTC 10 weeks.   Helane GuntherGregory Hall Birchard DPM

## 2016-03-13 DIAGNOSIS — M25551 Pain in right hip: Secondary | ICD-10-CM | POA: Diagnosis not present

## 2016-03-13 DIAGNOSIS — M6281 Muscle weakness (generalized): Secondary | ICD-10-CM | POA: Diagnosis not present

## 2016-03-13 DIAGNOSIS — R262 Difficulty in walking, not elsewhere classified: Secondary | ICD-10-CM | POA: Diagnosis not present

## 2016-03-13 DIAGNOSIS — Z96641 Presence of right artificial hip joint: Secondary | ICD-10-CM | POA: Diagnosis not present

## 2016-03-18 DIAGNOSIS — R262 Difficulty in walking, not elsewhere classified: Secondary | ICD-10-CM | POA: Diagnosis not present

## 2016-03-18 DIAGNOSIS — M25551 Pain in right hip: Secondary | ICD-10-CM | POA: Diagnosis not present

## 2016-03-18 DIAGNOSIS — M6281 Muscle weakness (generalized): Secondary | ICD-10-CM | POA: Diagnosis not present

## 2016-03-18 DIAGNOSIS — Z96641 Presence of right artificial hip joint: Secondary | ICD-10-CM | POA: Diagnosis not present

## 2016-03-20 DIAGNOSIS — Z96641 Presence of right artificial hip joint: Secondary | ICD-10-CM | POA: Diagnosis not present

## 2016-03-20 DIAGNOSIS — M6281 Muscle weakness (generalized): Secondary | ICD-10-CM | POA: Diagnosis not present

## 2016-03-20 DIAGNOSIS — R262 Difficulty in walking, not elsewhere classified: Secondary | ICD-10-CM | POA: Diagnosis not present

## 2016-03-20 DIAGNOSIS — M25551 Pain in right hip: Secondary | ICD-10-CM | POA: Diagnosis not present

## 2016-03-26 DIAGNOSIS — M25551 Pain in right hip: Secondary | ICD-10-CM | POA: Diagnosis not present

## 2016-03-26 DIAGNOSIS — Z96641 Presence of right artificial hip joint: Secondary | ICD-10-CM | POA: Diagnosis not present

## 2016-03-26 DIAGNOSIS — R262 Difficulty in walking, not elsewhere classified: Secondary | ICD-10-CM | POA: Diagnosis not present

## 2016-03-26 DIAGNOSIS — M6281 Muscle weakness (generalized): Secondary | ICD-10-CM | POA: Diagnosis not present

## 2016-03-28 DIAGNOSIS — M6281 Muscle weakness (generalized): Secondary | ICD-10-CM | POA: Diagnosis not present

## 2016-03-28 DIAGNOSIS — R262 Difficulty in walking, not elsewhere classified: Secondary | ICD-10-CM | POA: Diagnosis not present

## 2016-03-28 DIAGNOSIS — M25551 Pain in right hip: Secondary | ICD-10-CM | POA: Diagnosis not present

## 2016-03-28 DIAGNOSIS — Z96641 Presence of right artificial hip joint: Secondary | ICD-10-CM | POA: Diagnosis not present

## 2016-04-01 ENCOUNTER — Other Ambulatory Visit: Payer: Self-pay | Admitting: Cardiovascular Disease

## 2016-04-08 DIAGNOSIS — M25571 Pain in right ankle and joints of right foot: Secondary | ICD-10-CM | POA: Diagnosis not present

## 2016-04-16 ENCOUNTER — Ambulatory Visit: Payer: Medicare Other | Admitting: Podiatry

## 2016-04-22 DIAGNOSIS — M25571 Pain in right ankle and joints of right foot: Secondary | ICD-10-CM | POA: Diagnosis not present

## 2016-05-14 ENCOUNTER — Ambulatory Visit (INDEPENDENT_AMBULATORY_CARE_PROVIDER_SITE_OTHER): Payer: Medicare Other | Admitting: Podiatry

## 2016-05-14 ENCOUNTER — Encounter: Payer: Self-pay | Admitting: Podiatry

## 2016-05-14 DIAGNOSIS — Q828 Other specified congenital malformations of skin: Secondary | ICD-10-CM

## 2016-05-14 DIAGNOSIS — M79676 Pain in unspecified toe(s): Secondary | ICD-10-CM

## 2016-05-14 DIAGNOSIS — B351 Tinea unguium: Secondary | ICD-10-CM

## 2016-05-14 NOTE — Progress Notes (Signed)
Patient ID: Kristine [name], female   DOB: Sep 06, 1941, 75 y.o.   MRN: [id number] Complaint:  Visit Type: Patient returns to my office for continued preventative foot care services. Complaint: Patient states" my nails have grown long and thick and become painful to walk and wear shoes"  She also has developed callus on left foot which hurts when walking.  She presents for preventative foot care services. No changes to ROS  Podiatric Exam: Vascular: dorsalis pedis and posterior tibial pulses are palpable bilateral. Capillary return is immediate. Temperature gradient is WNL. Skin turgor WNL  Sensorium: Normal Semmes Weinstein monofilament test. Normal tactile sensation bilaterally. Nail Exam: Pt has thick disfigured discolored nails with subungual debris noted bilateral entire nail hallux through fifth toenails Ulcer Exam: There is no evidence of ulcer or pre-ulcerative changes or infection. Orthopedic Exam: Muscle tone and strength are WNL. No limitations in general ROM. No crepitus or effusions noted. Foot type and digits show no abnormalities. Bony prominence unremarkable.. Skin:  Porokeratosis sub 3,4 left foot..  No infection or ulcers  Diagnosis:  Tinea unguium, Pain in right toe, pain in left toes Porokeratosis Left,  .  Treatment & Plan Procedures and Treatment: Consent by patient was obtained for treatment procedures. The patient understood the discussion of treatment and procedures well. All questions were answered thoroughly reviewed. Debridement of mycotic and hypertrophic toenails, 1 through 5 bilateral and clearing of subungual debris. No ulceration, no infection noted. Debridement of porokeratosis .  Debridement of porokeratosis left foot Return Visit-Office Procedure: Patient instructed to return to the office for a follow up visit 10 weeks for continued evaluation and treatment.  Helane GuntherGregory Jonny Dearden DPM

## 2016-05-20 DIAGNOSIS — M25571 Pain in right ankle and joints of right foot: Secondary | ICD-10-CM | POA: Diagnosis not present

## 2016-06-03 DIAGNOSIS — S8254XA Nondisplaced fracture of medial malleolus of right tibia, initial encounter for closed fracture: Secondary | ICD-10-CM | POA: Diagnosis not present

## 2016-07-01 DIAGNOSIS — S8254XD Nondisplaced fracture of medial malleolus of right tibia, subsequent encounter for closed fracture with routine healing: Secondary | ICD-10-CM | POA: Diagnosis not present

## 2016-07-01 DIAGNOSIS — M25551 Pain in right hip: Secondary | ICD-10-CM | POA: Diagnosis not present

## 2016-07-23 ENCOUNTER — Ambulatory Visit (INDEPENDENT_AMBULATORY_CARE_PROVIDER_SITE_OTHER): Payer: Medicare Other | Admitting: Podiatry

## 2016-07-23 DIAGNOSIS — B351 Tinea unguium: Secondary | ICD-10-CM | POA: Diagnosis not present

## 2016-07-23 DIAGNOSIS — M79676 Pain in unspecified toe(s): Secondary | ICD-10-CM

## 2016-07-23 DIAGNOSIS — Q828 Other specified congenital malformations of skin: Secondary | ICD-10-CM

## 2016-07-23 NOTE — Progress Notes (Signed)
Patient ID: Kristine [name], female   DOB: 06-14-1941, 75 y.o.   MRN: [id number] Complaint:  Visit Type: Patient returns to my office for continued preventative foot care services. Complaint: Patient states" my nails have grown long and thick and become painful to walk and wear shoes"  She also has developed callus on left foot which hurts when walking.  She presents for preventative foot care services. No changes to ROS  Podiatric Exam: Vascular: dorsalis pedis and posterior tibial pulses are palpable bilateral. Capillary return is immediate. Temperature gradient is WNL. Skin turgor WNL  Sensorium: Normal Semmes Weinstein monofilament test. Normal tactile sensation bilaterally. Nail Exam: Pt has thick disfigured discolored nails with subungual debris noted bilateral entire nail hallux through fifth toenails Ulcer Exam: There is no evidence of ulcer or pre-ulcerative changes or infection. Orthopedic Exam: Muscle tone and strength are WNL. No limitations in general ROM. No crepitus or effusions noted. Foot type and digits show no abnormalities. Bony prominence unremarkable.. Skin:  Porokeratosis sub 3,4 left foot..  No infection or ulcers  Diagnosis:  Tinea unguium, Pain in right toe, pain in left toes Porokeratosis Left,  .  Treatment & Plan Procedures and Treatment: Consent by patient was obtained for treatment procedures. The patient understood the discussion of treatment and procedures well. All questions were answered thoroughly reviewed. Debridement of mycotic and hypertrophic toenails, 1 through 5 bilateral and clearing of subungual debris. No ulceration, no infection noted. Debridement of porokeratosis .  Debridement of porokeratosis left foot Return Visit-Office Procedure: Patient instructed to return to the office for a follow up visit 10 weeks for continued evaluation and treatment.  Helane GuntherGregory Cabe Lashley DPM

## 2016-07-29 ENCOUNTER — Other Ambulatory Visit: Payer: Self-pay | Admitting: Cardiovascular Disease

## 2016-08-14 ENCOUNTER — Other Ambulatory Visit: Payer: Self-pay | Admitting: *Deleted

## 2016-08-14 DIAGNOSIS — I2510 Atherosclerotic heart disease of native coronary artery without angina pectoris: Secondary | ICD-10-CM

## 2016-08-14 MED ORDER — NITROGLYCERIN 0.4 MG SL SUBL: 0.4000 mg | SUBLINGUAL_TABLET | SUBLINGUAL | 0 refills | Status: DC | PRN

## 2016-09-28 NOTE — Progress Notes (Signed)
Chief Complaint  Patient presents with  . Leg Pain    History of Present Illness: 75 yo female with history of CAD s/p inferior STEMI May 2011 treated with 3 DES in the proximal, mid and distal RCA, HTN, HLD, PAD, COPD here today for cardiac follow up. She has been followed in the past by Dr. Deborah Chalk. She is known to have anomalous takeoff of all coronaries. She had abnormal LE arterial dopplers in May 2011 and was seen by Dr. Betti Cruz in [facility] in August 2011. She has not been followed in [facility] office. Lower extremity arterial dopplers August 2016 showed ABI 0.73 on the right and 0.94 on the left. Conservative management has been pursued with her LE arterial disease. Stress myoview 10/05/13 with no ischemia. She was admitted in April 2016 to [facility] with GI bleeding.   She is here today for cardiac and PV follow up. She denies any chest pain or SOB. She has occasional chest fullness that improves with belching. She has no pain in her legs. No rest pain or ulcerations. She feels well.   Primary Care Physician: Perry Mount, MD  Past Medical History:  Diagnosis Date  . COPD (chronic obstructive pulmonary disease) (HCC)   . Hard of hearing   . History of anemia   . History of blood transfusion   . History of pneumonia   . HTN (hypertension)   . Hyperlipidemia   . Hyperthyroidism    treated wtih radioactive iodine  . OA (osteoarthritis of spine)   . PVD (peripheral vascular disease) (HCC)    Lower extremity Dopplers  May 2revealed an ABI of 0.83 in the right posterior   . Shortness of breath dyspnea   . STEMI (ST elevation myocardial infarction) [facility]) May 2011   Promus DES in the proximal, mid and distal RCA in May 2011  . Stroke (HCC)   . Urinary frequency   . Urinary urgency     Past Surgical History:  Procedure Laterality Date  . ABDOMINAL HYSTERECTOMY     Had ovarian resection and required lysis of adhesions  . CARDIAC CATHETERIZATION  04/07/2010   EF 60-70%  . CORONARY STENTS     x3  . ESOPHAGOGASTRODUODENOSCOPY N/A 03/10/2015   Procedure: ESOPHAGOGASTRODUODENOSCOPY (EGD);  Surgeon: Wandalee Ferdinand, MD;  Location: [facility] ENDOSCOPY;  Service: Endoscopy;  Laterality: N/A;  . OVARY SURGERY    . TOTAL HIP ARTHROPLASTY  2004   left  . TOTAL HIP ARTHROPLASTY Right 01/23/2016   Procedure: RIGHT TOTAL HIP ARTHROPLASTY ANTERIOR APPROACH;  Surgeon: Loreta Ave, MD;  Location: [facility] OR;  Service: Orthopedics;  Laterality: Right;    Current Outpatient Prescriptions  Medication Sig Dispense Refill  . Albuterol Sulfate (PROAIR HFA IN) Inhale into the lungs.    Marland Kitchen atorvastatin (LIPITOR) 20 MG tablet Take 1 tablet (20 mg total) by mouth daily. 90 tablet 2  . fluticasone (FLONASE) 50 MCG/ACT nasal spray USE 2 SPRAYS IN EACH NOSTRIL QD AT NIGHT  5  . hydrALAZINE (APRESOLINE) 25 MG tablet TAKE 1 TABLET(25 MG) BY MOUTH DAILY 30 tablet 11  . hydrochlorothiazide (HYDRODIURIL) 25 MG tablet TAKE 1 TABLET BY MOUTH DAILY 30 tablet 3  . methimazole (TAPAZOLE) 5 MG tablet Take 5 mg by mouth daily.    . metoprolol succinate (TOPROL-XL) 50 MG 24 hr tablet TAKE 1 TABLET BY MOUTH DAILY. TAKE WITH OR IMMEDIATELY FOLLOWING A MEAL 30 tablet 11  . nitroGLYCERIN (NITROSTAT) 0.4 MG SL tablet Place 1 tablet (0.4 mg total)  under the tongue every 5 (five) minutes as needed for chest pain. 25 tablet 0  . PROAIR HFA 108 (90 BASE) MCG/ACT inhaler Inhale 1 puff into the lungs every 6 (six) hours as needed for wheezing or shortness of breath.     Laurann Montana. Tavaborole (KERYDIN) 5 % SOLN Apply 1 drop to each affected toenail once daily for 12 months 10 mL 11   No current facility-administered medications for this visit.     Allergies  Allergen Reactions  . Ace Inhibitors Swelling    Angioedema of the tongue  . Iodinated Diagnostic Agents Anaphylaxis  . Other Anaphylaxis    Allergic to melons.   Marland Kitchen. Shellfish-Derived Products Anaphylaxis  . Hydrocodone Other (See Comments)    unspecified    Social History   Social History    . Marital status: Married    Spouse name: N/A  . Number of children: 3  . Years of education: N/A   Occupational History  . Retired    Social History Main Topics  . Smoking status: Former Smoker    Packs/day: 1.00    Years: 45.00    Types: Cigarettes    Quit date: 04/07/2010  . Smokeless tobacco: Not on file  . Alcohol use Yes     Comment: rarely   . Drug use: No  . Sexual activity: Not on file   Other Topics Concern  . Not on file   Social History Narrative  . No narrative on file    Family History  Problem Relation Age of Onset  . Heart attack Father   . Peripheral vascular disease Mother     Review of Systems:  As stated in the HPI and otherwise negative.   BP 120/68   Pulse 73   Ht 5' 1.5" (1.562 m)   Wt 131 lb (59.4 kg)   BMI 24.35 kg/m   Physical Examination: General: Well developed, well nourished, NAD  HEENT: OP clear, mucus membranes moist  SKIN: warm, dry. No rashes. Neuro: No focal deficits  Musculoskeletal: Muscle strength 5/5 all ext  Psychiatric: Mood and affect normal  Neck: No JVD, no carotid bruits, no thyromegaly, no lymphadenopathy.  Lungs:Clear bilaterally, no wheezes, rhonci, crackles Cardiovascular: Regular rate and rhythm. No murmurs, gallops or rubs. Abdomen:Soft. Bowel sounds present. Non-tender.  Extremities: No lower extremity edema. Pulses are trace in the bilateral DP/PT.  ABI 07/09/15: Right 0.73. Left 0.94  Stress myoview 10/05/13:  Stress Procedure: The patient received IV Lexiscan 0.4 mg over 15-seconds. Technetium 4456m Sestamibi injected at 30-seconds. Quantitative spect images were obtained after a 45 minute delay. Patient complained of a weak feeling, chest tightness and stomach hurting. 75 mg aminophylline was given by Bonnita LevanJackie Smith, RN six minutes into recovery. Symptoms subsided after a few minutes.  Stress ECG: No significant change from baseline ECG  QPS Raw Data Images: Normal; no motion artifact; normal  heart/lung ratio. Stress Images: Normal homogeneous uptake in all areas of the myocardium. Rest Images: Normal homogeneous uptake in all areas of the myocardium. Subtraction (SDS): No evidence of ischemia. Transient Ischemic Dilatation (Normal <1.22): 0.97 Lung/Heart Ratio (Normal <0.45): 0.26  Quantitative Gated Spect Images QGS EDV: 55 ml QGS ESV: 16 ml  Impression Exercise Capacity: Lexiscan with no exercise. BP Response: Normal blood pressure response. Clinical Symptoms: Mild chest pain/dyspnea. ECG Impression: No significant ST segment change suggestive of ischemia. Comparison with Prior Nuclear Study: No previous nuclear study performed  Overall Impression: Normal stress nuclear study.  LV Ejection Fraction:  71%. LV Wall Motion: NL LV Function; NL Wall Motion  EKG:  EKG is ordered today. The ekg ordered today demonstrates NSR, rate 73 bpm.   Recent Labs: 01/10/2016: ALT 10 01/24/2016: BUN 10; Creatinine, Ser 0.63; Hemoglobin 9.9; Platelets 275; Potassium 4.1; Sodium 139   Lipid Panel Lipid Panel     Component Value Date/Time   CHOL 154 07/31/2014 1153   TRIG 103.0 07/31/2014 1153   HDL 50.00 07/31/2014 1153   CHOLHDL 3 07/31/2014 1153   VLDL 20.6 07/31/2014 1153   LDLCALC 83 07/31/2014 1153   LDLDIRECT 138.9 07/23/2011 0815      Wt Readings from Last 3 Encounters:  09/29/16 131 lb (59.4 kg)  01/10/16 123 lb 9.6 oz (56.1 kg)  07/09/15 129 lb (58.5 kg)     Other studies Reviewed: Additional studies/ records that were reviewed today include: . Review of the above records demonstrates:    Assessment and Plan:   1. PAD: Stable with no symptoms of claudication.  No rest pain or ulcerations. She no longer smokes. Will encourage ambulation daily. Conservative management for now unless her clinical picture changes. Repeat ABI now.  2. CAD: No chest pain suggestive of angina. Stress myoview 2014 with no ischemia.  Continue ASA/statin/beta blocker.    Current medicines are reviewed at length with the patient today.  The patient does not have concerns regarding medicines.  The following changes have been made:  no change  Labs/ tests ordered today include:   Orders Placed This Encounter  Procedures  . EKG 12-Lead    Disposition:   FU with me in 12  months  Signed, Verne Carrow, MD 09/29/2016 9:34 AM    [facility] [address] [place], [place], [place]  [address] Phone: [phone]; Fax: [phone]

## 2016-09-29 ENCOUNTER — Encounter (INDEPENDENT_AMBULATORY_CARE_PROVIDER_SITE_OTHER): Payer: Self-pay

## 2016-09-29 ENCOUNTER — Encounter: Payer: Self-pay | Admitting: Cardiovascular Disease

## 2016-09-29 ENCOUNTER — Ambulatory Visit (INDEPENDENT_AMBULATORY_CARE_PROVIDER_SITE_OTHER): Payer: Medicare Other | Admitting: Cardiovascular Disease

## 2016-09-29 VITALS — BP 120/68 | HR 73 | Ht 61.5 in | Wt 131.0 lb

## 2016-09-29 DIAGNOSIS — I2510 Atherosclerotic heart disease of native coronary artery without angina pectoris: Secondary | ICD-10-CM

## 2016-09-29 DIAGNOSIS — I739 Peripheral vascular disease, unspecified: Secondary | ICD-10-CM | POA: Diagnosis not present

## 2016-09-29 NOTE — Addendum Note (Signed)
Addended by: Dossie ArbourADELMAN, Renly Roots L on: 09/29/2016 09:39 AM   Modules accepted: Orders

## 2016-09-29 NOTE — Patient Instructions (Signed)
Medication Instructions:  Your physician recommends that you continue on your current medications as directed. Please refer to the Current Medication list given to you today.    Labwork: none  Testing/Procedures: Your physician has requested that you have an ankle brachial index (ABI). During this test an ultrasound and blood pressure cuff are used to evaluate the arteries that supply the arms and legs with blood. Allow thirty minutes for this exam. There are no restrictions or special instructions.    Follow-Up: Your physician wants you to follow-up in: 12 months.  You will receive a reminder letter in the mail two months in advance. If you don't receive a letter, please call our office to schedule the follow-up appointment.   Any Other Special Instructions Will Be Listed Below (If Applicable).     If you need a refill on your cardiac medications before your next appointment, please call your pharmacy.   

## 2016-10-01 ENCOUNTER — Encounter: Payer: Self-pay | Admitting: Podiatry

## 2016-10-01 ENCOUNTER — Ambulatory Visit (INDEPENDENT_AMBULATORY_CARE_PROVIDER_SITE_OTHER): Payer: Medicare Other | Admitting: Podiatry

## 2016-10-01 VITALS — Ht 61.5 in | Wt 131.0 lb

## 2016-10-01 DIAGNOSIS — B351 Tinea unguium: Secondary | ICD-10-CM

## 2016-10-01 DIAGNOSIS — Q828 Other specified congenital malformations of skin: Secondary | ICD-10-CM | POA: Diagnosis not present

## 2016-10-01 DIAGNOSIS — M79676 Pain in unspecified toe(s): Secondary | ICD-10-CM

## 2016-10-01 NOTE — Progress Notes (Signed)
Patient ID: Kristine [name], female   DOB: 12/23/1940, 75 y.o.   MRN: [id number] Complaint:  Visit Type: Patient returns to my office for continued preventative foot care services. Complaint: Patient states" my nails have grown long and thick and become painful to walk and wear shoes"  She also has developed callus on left foot which hurts when walking.  She presents for preventative foot care services. No changes to ROS  Podiatric Exam: Vascular: dorsalis pedis and posterior tibial pulses are palpable bilateral. Capillary return is immediate. Temperature gradient is WNL. Skin turgor WNL  Sensorium: Normal Semmes Weinstein monofilament test. Normal tactile sensation bilaterally. Nail Exam: Pt has thick disfigured discolored nails with subungual debris noted bilateral entire nail hallux through fifth toenails Ulcer Exam: There is no evidence of ulcer or pre-ulcerative changes or infection. Orthopedic Exam: Muscle tone and strength are WNL. No limitations in general ROM. No crepitus or effusions noted. Foot type and digits show no abnormalities. Bony prominence unremarkable.. Skin:  Porokeratosis sub 3,4 left foot..  No infection or ulcers  Diagnosis:  Tinea unguium, Pain in right toe, pain in left toes Porokeratosis Left,  .  Treatment & Plan Procedures and Treatment: Consent by patient was obtained for treatment procedures. The patient understood the discussion of treatment and procedures well. All questions were answered thoroughly reviewed. Debridement of mycotic and hypertrophic toenails, 1 through 5 bilateral and clearing of subungual debris. No ulceration, no infection noted. Debridement of porokeratosis .  Debridement of porokeratosis left foot Return Visit-Office Procedure: Patient instructed to return to the office for a follow up visit 10 weeks for continued evaluation and treatment.  Cedar Ditullio DPM 

## 2016-10-08 ENCOUNTER — Other Ambulatory Visit: Payer: Self-pay | Admitting: Cardiovascular Disease

## 2016-10-16 ENCOUNTER — Other Ambulatory Visit: Payer: Self-pay | Admitting: Cardiovascular Disease

## 2016-10-16 DIAGNOSIS — I739 Peripheral vascular disease, unspecified: Secondary | ICD-10-CM

## 2016-10-17 ENCOUNTER — Encounter (HOSPITAL_COMMUNITY): Payer: Medicare Other

## 2016-10-22 ENCOUNTER — Ambulatory Visit (HOSPITAL_COMMUNITY)
Admission: RE | Admit: 2016-10-22 | Discharge: 2016-10-22 | Disposition: A | Discharge status: Home / Self Care | Payer: Medicare Other | Source: Ambulatory Visit | Attending: Cardiology | Admitting: Cardiology

## 2016-10-22 DIAGNOSIS — I739 Peripheral vascular disease, unspecified: Secondary | ICD-10-CM | POA: Diagnosis not present

## 2016-12-11 ENCOUNTER — Ambulatory Visit: Payer: Medicare Other | Admitting: Podiatry

## 2016-12-17 ENCOUNTER — Ambulatory Visit (INDEPENDENT_AMBULATORY_CARE_PROVIDER_SITE_OTHER): Payer: Medicare Other | Admitting: Podiatry

## 2016-12-17 ENCOUNTER — Encounter: Payer: Self-pay | Admitting: Podiatry

## 2016-12-17 VITALS — Ht 61.5 in | Wt 131.0 lb

## 2016-12-17 DIAGNOSIS — M79676 Pain in unspecified toe(s): Secondary | ICD-10-CM | POA: Diagnosis not present

## 2016-12-17 DIAGNOSIS — B351 Tinea unguium: Secondary | ICD-10-CM

## 2016-12-17 DIAGNOSIS — Q828 Other specified congenital malformations of skin: Secondary | ICD-10-CM

## 2016-12-17 NOTE — Progress Notes (Signed)
Patient ID: [name], female   DOB: 05-04-1941, 76 y.o.   MRN: [id number] Complaint:  Visit Type: Patient returns to my office for continued preventative foot care services. Complaint: Patient states" my nails have grown long and thick and become painful to walk and wear shoes"  She also has developed callus on left foot which hurts when walking.  She presents for preventative foot care services. No changes to ROS  Podiatric Exam: Vascular: dorsalis pedis and posterior tibial pulses are palpable bilateral. Capillary return is immediate. Temperature gradient is WNL. Skin turgor WNL  Sensorium: Normal Semmes Weinstein monofilament test. Normal tactile sensation bilaterally. Nail Exam: Pt has thick disfigured discolored nails with subungual debris noted bilateral entire nail hallux through fifth toenails Ulcer Exam: There is no evidence of ulcer or pre-ulcerative changes or infection. Orthopedic Exam: Muscle tone and strength are WNL. No limitations in general ROM. No crepitus or effusions noted. Foot type and digits show no abnormalities. Bony prominence unremarkable.. Skin:  Porokeratosis sub 3,4 left foot..  No infection or ulcers.  HD 5th toe left foot Diagnosis:  Tinea unguium, Pain in right toe, pain in left toes Porokeratosis Left,  .  Treatment & Plan Procedures and Treatment: Consent by patient was obtained for treatment procedures. The patient understood the discussion of treatment and procedures well. All questions were answered thoroughly reviewed. Debridement of mycotic and hypertrophic toenails, 1 through 5 bilateral and clearing of subungual debris. No ulceration, no infection noted.   Debridement of porokeratosis left foot and HD 5th left foot. Return Visit-Office Procedure: Patient instructed to return to the office for a follow up visit 6 weeks for continued evaluation and treatment.  Helane GuntherGregory Byrd Terrero DPM

## 2016-12-30 DIAGNOSIS — E7800 Pure hypercholesterolemia, unspecified: Secondary | ICD-10-CM | POA: Diagnosis not present

## 2016-12-30 DIAGNOSIS — M2550 Pain in unspecified joint: Secondary | ICD-10-CM | POA: Diagnosis not present

## 2016-12-30 DIAGNOSIS — E039 Hypothyroidism, unspecified: Secondary | ICD-10-CM | POA: Diagnosis not present

## 2016-12-30 DIAGNOSIS — I10 Essential (primary) hypertension: Secondary | ICD-10-CM | POA: Diagnosis not present

## 2017-01-12 ENCOUNTER — Other Ambulatory Visit: Payer: Self-pay | Admitting: Internal Medicine

## 2017-01-12 DIAGNOSIS — E2839 Other primary ovarian failure: Secondary | ICD-10-CM

## 2017-01-12 DIAGNOSIS — Z1231 Encounter for screening mammogram for malignant neoplasm of breast: Secondary | ICD-10-CM

## 2017-01-15 ENCOUNTER — Other Ambulatory Visit: Payer: Self-pay | Admitting: Internal Medicine

## 2017-01-15 ENCOUNTER — Other Ambulatory Visit: Payer: Self-pay | Admitting: Hematology and Oncology

## 2017-01-15 DIAGNOSIS — N63 Unspecified lump in unspecified breast: Secondary | ICD-10-CM

## 2017-01-15 DIAGNOSIS — E2839 Other primary ovarian failure: Secondary | ICD-10-CM

## 2017-01-19 ENCOUNTER — Other Ambulatory Visit: Payer: BC Managed Care – PPO

## 2017-01-19 ENCOUNTER — Ambulatory Visit: Payer: BC Managed Care – PPO

## 2017-01-29 ENCOUNTER — Encounter: Payer: Self-pay | Admitting: Podiatry

## 2017-01-29 ENCOUNTER — Ambulatory Visit (INDEPENDENT_AMBULATORY_CARE_PROVIDER_SITE_OTHER): Payer: Medicare Other | Admitting: Podiatry

## 2017-01-29 VITALS — Ht 61.5 in | Wt 131.0 lb

## 2017-01-29 DIAGNOSIS — L84 Corns and callosities: Secondary | ICD-10-CM

## 2017-01-29 DIAGNOSIS — Q828 Other specified congenital malformations of skin: Secondary | ICD-10-CM | POA: Diagnosis not present

## 2017-01-29 NOTE — Progress Notes (Signed)
Subjective:     Patient ID: Kristine [name], female   DOB: 1941/08/06, [age] y.Vear Clocko.   MRN: [id number]  HPI this patient presents the office with chief complaint of painful calluses on the bottom of her left foot.  She states that her foot is painful walking and wearing shoes. She presents for preventative foot care services.  She has painful corn fifth toe left foot.  She presents for debridement of painful skin lesions.     Review of Systems     Objective:   Physical Exam GENERAL APPEARANCE: Alert, conversant. Appropriately groomed. No acute distress.  VASCULAR: Pedal pulses are  palpable at  [facility] bilateral.  Capillary refill time is immediate to all digits,  Normal temperature gradient.  Digital hair growth is present bilateral  NEUROLOGIC: sensation is normal to 5.07 monofilament at 5/5 sites bilateral.  Light touch is intact bilateral, Muscle strength normal.  MUSCULOSKELETAL: acceptable muscle strength, tone and stability bilateral.  Intrinsic muscluature intact bilateral.  Rectus appearance of foot and digits noted bilateral.   DERMATOLOGIC: skin color, texture, and turgor are within normal limits.  No preulcerative lesions or ulcers  are seen, no interdigital maceration noted.  No open lesions present.   No drainage noted.Porokeratosis sub 3,4 left foot.  Heloma durum fifth toe left foot.  Porokeratosis lateral aspect fifth metabase left foot.  NAILS  Thick disfigured discolored nails both feet.      Assessment:     Porokeratosis left foot.    Plan:     ROV  Debridement of porokeratosis left foot.  RTC 6 weeks.   Helane GuntherGregory Nioka Thorington DPM

## 2017-02-17 ENCOUNTER — Other Ambulatory Visit: Payer: Self-pay | Admitting: Cardiovascular Disease

## 2017-02-19 NOTE — Telephone Encounter (Signed)
Last lipid panel in epic is from 07/31/14. Please advise on refill. Thanks, MI

## 2017-02-20 ENCOUNTER — Other Ambulatory Visit: Payer: Self-pay | Admitting: Cardiovascular Disease

## 2017-02-20 MED ORDER — ATORVASTATIN CALCIUM 20 MG PO TABS: 20.0000 mg | ORAL_TABLET | Freq: Every day | ORAL | 1 refills | Status: DC

## 2017-02-20 NOTE — Telephone Encounter (Signed)
OK to refill for 2 month supply. I placed call to pt to see if she has had lab work done elsewhere recently. Left message to call back.  Further refills to be determined after talking with pt

## 2017-02-20 NOTE — Telephone Encounter (Signed)
Patient returned your call and she stated that she had labs drawn in January of this year. A lipid panel was included in this as she read off to me what all labs were drawn. She will contact her pcp and ask that they fax a copy to the office.

## 2017-02-25 NOTE — Telephone Encounter (Signed)
I spoke with pt. She reports primary care physician is Dr. Margaretmary BayleyPreston Clark.  She will call his office today to request labs be faxed to us

## 2017-02-25 NOTE — Telephone Encounter (Signed)
We have not received lab work. I placed call to pt and left message to call office

## 2017-03-02 ENCOUNTER — Other Ambulatory Visit: Payer: Self-pay | Admitting: *Deleted

## 2017-03-02 MED ORDER — ATORVASTATIN CALCIUM 20 MG PO TABS: 20.0000 mg | ORAL_TABLET | Freq: Every day | ORAL | 3 refills | Status: DC

## 2017-03-02 NOTE — Telephone Encounter (Signed)
Labs have been scanned into chart. Additional refills for Atorvastatin sent to pharmacy

## 2017-03-11 ENCOUNTER — Ambulatory Visit (INDEPENDENT_AMBULATORY_CARE_PROVIDER_SITE_OTHER): Payer: Medicare Other | Admitting: Podiatry

## 2017-03-11 DIAGNOSIS — Q828 Other specified congenital malformations of skin: Secondary | ICD-10-CM | POA: Diagnosis not present

## 2017-03-11 NOTE — Progress Notes (Signed)
Subjective:     Patient ID: Kristine [name], female   DOB: 08/25/1941, 76 y.o.   MRN: [id number]  HPI this patient presents the office with chief complaint of painful calluses on the bottom of her left foot.  She states that her foot is painful walking and wearing shoes. She presents for preventative foot care services.  She has painful corn fifth toe left foot.  She presents for debridement of painful skin lesions.     Review of Systems     Objective:   Physical Exam GENERAL APPEARANCE: Alert, conversant. Appropriately groomed. No acute distress.  VASCULAR: Pedal pulses are  palpable at  [facility] and PT bilateral.  Capillary refill time is immediate to all digits,  Normal temperature gradient.  Digital hair growth is present bilateral  NEUROLOGIC: sensation is normal to 5.07 monofilament at 5/5 sites bilateral.  Light touch is intact bilateral, Muscle strength normal.  MUSCULOSKELETAL: acceptable muscle strength, tone and stability bilateral.  Intrinsic muscluature intact bilateral.  Rectus appearance of foot and digits noted bilateral.   DERMATOLOGIC: skin color, texture, and turgor are within normal limits.  No preulcerative lesions or ulcers  are seen, no interdigital maceration noted.  No open lesions present.   No drainage noted.Porokeratosis sub 3,4 left foot.  Heloma durum fifth toe left foot.  Porokeratosis lateral aspect fifth metabase left foot.  NAILS  Thick disfigured discolored nails both feet.      Assessment:     Porokeratosis left foot.    Plan:     ROV  Debridement of porokeratosis left foot.  RTC 6 weeks. Discussed possible surgery for possible osteotomy left foot to help eliminate her IPK.  To make an appointment with Dr. Al Corpus.     Helane Gunther DPM

## 2017-03-26 ENCOUNTER — Ambulatory Visit (INDEPENDENT_AMBULATORY_CARE_PROVIDER_SITE_OTHER): Payer: Medicare Other | Admitting: Podiatry

## 2017-03-26 ENCOUNTER — Encounter: Payer: Self-pay | Admitting: Podiatry

## 2017-03-26 ENCOUNTER — Ambulatory Visit (INDEPENDENT_AMBULATORY_CARE_PROVIDER_SITE_OTHER): Payer: Medicare Other

## 2017-03-26 DIAGNOSIS — M778 Other enthesopathies, not elsewhere classified: Secondary | ICD-10-CM

## 2017-03-26 DIAGNOSIS — M2042 Other hammer toe(s) (acquired), left foot: Secondary | ICD-10-CM

## 2017-03-26 DIAGNOSIS — M7752 Other enthesopathy of left foot: Secondary | ICD-10-CM

## 2017-03-26 DIAGNOSIS — Q828 Other specified congenital malformations of skin: Secondary | ICD-10-CM

## 2017-03-26 DIAGNOSIS — M779 Enthesopathy, unspecified: Secondary | ICD-10-CM

## 2017-03-26 NOTE — Progress Notes (Signed)
She presents today chief complaint of painful lesions to the plantar aspect of her left foot.  Objective: Vital signs are stable she is alert and oriented 3. She has pain on palpation fifth metatarsophalangeal joint area of the left foot. Kristine [name] is present.  Assessment: Capsulitis fifth metatarsophalangeal joint. Porokeratosis plantar aspect left foot.  Plan: Injected the area today with dexamethasone and local anesthetic debris already hyperkeratoses placed padding and discussed for shoe gear stretching exercise ice therapies. Follow up with her as needed.

## 2017-03-30 ENCOUNTER — Other Ambulatory Visit: Payer: Self-pay | Admitting: Cardiovascular Disease

## 2017-03-30 DIAGNOSIS — I2510 Atherosclerotic heart disease of native coronary artery without angina pectoris: Secondary | ICD-10-CM

## 2017-04-08 ENCOUNTER — Ambulatory Visit
Admission: RE | Admit: 2017-04-08 | Discharge: 2017-04-08 | Disposition: A | Discharge status: Home / Self Care | Payer: Medicare Other | Source: Ambulatory Visit | Attending: Internal Medicine | Admitting: Internal Medicine

## 2017-04-08 DIAGNOSIS — Z1231 Encounter for screening mammogram for malignant neoplasm of breast: Secondary | ICD-10-CM

## 2017-04-08 DIAGNOSIS — E2839 Other primary ovarian failure: Secondary | ICD-10-CM

## 2017-04-22 ENCOUNTER — Encounter: Payer: Self-pay | Admitting: Podiatry

## 2017-04-22 ENCOUNTER — Ambulatory Visit (INDEPENDENT_AMBULATORY_CARE_PROVIDER_SITE_OTHER): Payer: Medicare Other | Admitting: Podiatry

## 2017-04-22 DIAGNOSIS — Q828 Other specified congenital malformations of skin: Secondary | ICD-10-CM | POA: Diagnosis not present

## 2017-04-22 DIAGNOSIS — M2042 Other hammer toe(s) (acquired), left foot: Secondary | ICD-10-CM

## 2017-04-22 NOTE — Progress Notes (Signed)
Subjective:     Patient ID: Kristine [name], female   DOB: 1941-06-01, 76 y.o.   MRN: [id number]  HPI this patient presents the office with chief complaint of painful calluses on the bottom of her left foot.  She states that her foot is painful walking and wearing shoes. She presents for preventative foot care services.  She has painful corn fifth toe left foot.  She presents for debridement of painful skin lesions. She says she talked with Dr.  Al CorpusHyatt for evaluation of her feet for possible surgery.     Review of Systems     Objective:   Physical Exam GENERAL APPEARANCE: Alert, conversant. Appropriately groomed. No acute distress.  VASCULAR: Pedal pulses are  palpable at  [facility] and PT bilateral.  Capillary refill time is immediate to all digits,  Normal temperature gradient.  Digital hair growth is present bilateral  NEUROLOGIC: sensation is normal to 5.07 monofilament at 5/5 sites bilateral.  Light touch is intact bilateral, Muscle strength normal.  MUSCULOSKELETAL: acceptable muscle strength, tone and stability bilateral.  Intrinsic muscluature intact bilateral.  Rectus appearance of foot and digits noted bilateral.   DERMATOLOGIC: skin color, texture, and turgor are within normal limits.  No preulcerative lesions or ulcers  are seen, no interdigital maceration noted.  No open lesions present.   No drainage noted.Porokeratosis sub 3,4 left foot.  Heloma durum fifth toe left foot.  Porokeratosis lateral aspect fifth metabase left foot.  NAILS  Thick disfigured discolored nails both feet.      Assessment:     Porokeratosis left foot.    Plan:     ROV  Debridement of porokeratosis left foot.  RTC 6 weeks.   Helane GuntherGregory Rayla Pember DPM

## 2017-06-03 ENCOUNTER — Ambulatory Visit (INDEPENDENT_AMBULATORY_CARE_PROVIDER_SITE_OTHER): Payer: Medicare Other | Admitting: Podiatry

## 2017-06-03 ENCOUNTER — Encounter: Payer: Self-pay | Admitting: Podiatry

## 2017-06-03 DIAGNOSIS — M7752 Other enthesopathy of left foot: Secondary | ICD-10-CM

## 2017-06-03 DIAGNOSIS — Q828 Other specified congenital malformations of skin: Secondary | ICD-10-CM | POA: Diagnosis not present

## 2017-06-03 DIAGNOSIS — M779 Enthesopathy, unspecified: Secondary | ICD-10-CM

## 2017-06-03 DIAGNOSIS — M778 Other enthesopathies, not elsewhere classified: Secondary | ICD-10-CM

## 2017-06-03 NOTE — Progress Notes (Signed)
Subjective:     Patient ID: Kristine [name], female   DOB: [birth year], 76 y.o.   MRN: [id number]  HPI this patient presents the office with chief complaint of painful calluses on the bottom of her left foot.  She states that her foot is painful walking and wearing shoes. She presents for preventative foot care services.  She has painful corn fifth toe left foot.  She presents for debridement of painful skin lesions. She says she talked with Dr.  Al CorpusHyatt for evaluation of her feet for possible surgery.     Review of Systems     Objective:   Physical Exam GENERAL APPEARANCE: Alert, conversant. Appropriately groomed. No acute distress.  VASCULAR: Pedal pulses are  palpable at  [facility] bilateral.  Capillary refill time is immediate to all digits,  Normal temperature gradient.  Digital hair growth is present bilateral  NEUROLOGIC: sensation is normal to 5.07 monofilament at 5/5 sites bilateral.  Light touch is intact bilateral, Muscle strength normal.  MUSCULOSKELETAL: acceptable muscle strength, tone and stability bilateral.  Intrinsic muscluature intact bilateral.  Rectus appearance of foot and digits noted bilateral.   DERMATOLOGIC: skin color, texture, and turgor are within normal limits.  No preulcerative lesions or ulcers  are seen, no interdigital maceration noted.  No open lesions present.   No drainage noted.Porokeratosis sub 3,4 left foot.  Heloma durum fifth toe left foot.  Porokeratosis lateral aspect fifth metabase left foot.  NAILS  Thick disfigured discolored nails both feet.      Assessment:     Porokeratosis left foot.    Plan:     ROV  Debridement of porokeratosis left foot.  RTC 6 weeks.   Helane GuntherGregory Macaulay Reicher DPM

## 2017-07-15 ENCOUNTER — Encounter: Payer: Self-pay | Admitting: Podiatry

## 2017-07-15 ENCOUNTER — Ambulatory Visit (INDEPENDENT_AMBULATORY_CARE_PROVIDER_SITE_OTHER): Payer: Medicare Other | Admitting: Podiatry

## 2017-07-15 DIAGNOSIS — B351 Tinea unguium: Secondary | ICD-10-CM | POA: Diagnosis not present

## 2017-07-15 DIAGNOSIS — M79676 Pain in unspecified toe(s): Secondary | ICD-10-CM

## 2017-07-15 DIAGNOSIS — Q828 Other specified congenital malformations of skin: Secondary | ICD-10-CM

## 2017-07-15 NOTE — Progress Notes (Signed)
Patient ID: [name], female   DOB: 03-Dec-1941, 76 y.o.   MRN: [id number] Complaint:  Visit Type: Patient returns to my office for continued preventative foot care services. Complaint: Patient states" my nails have grown long and thick and become painful to walk and wear shoes"  She also has developed callus on left foot which hurts when walking.  She presents for preventative foot care services. No changes to ROS  Podiatric Exam: Vascular: dorsalis pedis and posterior tibial pulses are palpable bilateral. Capillary return is immediate. Temperature gradient is WNL. Skin turgor WNL  Sensorium: Normal Semmes Weinstein monofilament test. Normal tactile sensation bilaterally. Nail Exam: Pt has thick disfigured discolored nails with subungual debris noted bilateral entire nail hallux through fifth toenails Ulcer Exam: There is no evidence of ulcer or pre-ulcerative changes or infection. Orthopedic Exam: Muscle tone and strength are WNL. No limitations in general ROM. No crepitus or effusions noted. Foot type and digits show no abnormalities. Bony prominence unremarkable.. Skin:  Porokeratosis sub 3,4 left foot..  No infection or ulcers  Diagnosis:  Tinea unguium, Pain in right toe, pain in left toes Porokeratosis Left,  .  Treatment & Plan Procedures and Treatment: Consent by patient was obtained for treatment procedures. The patient understood the discussion of treatment and procedures well. All questions were answered thoroughly reviewed. Debridement of mycotic and hypertrophic toenails, 1 through 5 bilateral and clearing of subungual debris. No ulceration, no infection noted. Debridement of porokeratosis .  Debridement of porokeratosis left foot Return Visit-Office Procedure: Patient instructed to return to the office for a follow up visit 6 weeks for continued evaluation and treatment.  Helane GuntherGregory Terrie Grajales DPM

## 2017-08-19 ENCOUNTER — Other Ambulatory Visit: Payer: Self-pay | Admitting: Cardiovascular Disease

## 2017-08-26 ENCOUNTER — Ambulatory Visit: Payer: Medicare Other | Admitting: Podiatry

## 2017-09-16 ENCOUNTER — Ambulatory Visit (INDEPENDENT_AMBULATORY_CARE_PROVIDER_SITE_OTHER): Payer: Medicare Other | Admitting: Podiatry

## 2017-09-16 ENCOUNTER — Encounter: Payer: Self-pay | Admitting: Podiatry

## 2017-09-16 DIAGNOSIS — B351 Tinea unguium: Secondary | ICD-10-CM

## 2017-09-16 DIAGNOSIS — M79676 Pain in unspecified toe(s): Secondary | ICD-10-CM

## 2017-09-16 DIAGNOSIS — Q828 Other specified congenital malformations of skin: Secondary | ICD-10-CM

## 2017-09-16 NOTE — Progress Notes (Signed)
Patient ID: Kristine [name], female   DOB: [birth year], 76 y.o.   MRN: [id number] Complaint:  Visit Type: Patient returns to my office for continued preventative foot care services. Complaint: Patient states" my nails have grown long and thick and become painful to walk and wear shoes"  She also has developed callus on left foot which hurts when walking.  She presents for preventative foot care services. No changes to ROS  Podiatric Exam: Vascular: dorsalis pedis and posterior tibial pulses are palpable bilateral. Capillary return is immediate. Temperature gradient is WNL. Skin turgor WNL  Sensorium: Normal Semmes Weinstein monofilament test. Normal tactile sensation bilaterally. Nail Exam: Pt has thick disfigured discolored nails with subungual debris noted bilateral entire nail hallux through fifth toenails Ulcer Exam: There is no evidence of ulcer or pre-ulcerative changes or infection. Orthopedic Exam: Muscle tone and strength are WNL. No limitations in general ROM. No crepitus or effusions noted. Foot type and digits show no abnormalities. Bony prominence unremarkable.. Skin:  Porokeratosis sub 3,4 left foot..  No infection or ulcers  Diagnosis:  Tinea unguium, Pain in right toe, pain in left toes Porokeratosis Left,  .  Treatment & Plan Procedures and Treatment: Consent by patient was obtained for treatment procedures. The patient understood the discussion of treatment and procedures well. All questions were answered thoroughly reviewed. Debridement of mycotic and hypertrophic toenails, 1 through 5 bilateral and clearing of subungual debris. No ulceration, no infection noted. .  Debridement of porokeratosis left foot Return Visit-Office Procedure: Patient instructed to return to the office for a follow up visit 9 weeks for continued evaluation and treatment.  Helane Gunther DPM

## 2017-09-25 ENCOUNTER — Other Ambulatory Visit (HOSPITAL_COMMUNITY): Payer: Self-pay | Admitting: General Practice

## 2017-09-25 ENCOUNTER — Ambulatory Visit (HOSPITAL_COMMUNITY)
Admission: RE | Admit: 2017-09-25 | Discharge: 2017-09-25 | Disposition: A | Discharge status: Home / Self Care | Payer: Medicare Other | Source: Ambulatory Visit | Attending: Internal Medicine | Admitting: Internal Medicine

## 2017-09-25 ENCOUNTER — Other Ambulatory Visit: Payer: Self-pay | Admitting: Cardiovascular Disease

## 2017-09-25 ENCOUNTER — Other Ambulatory Visit: Payer: Self-pay | Admitting: Internal Medicine

## 2017-09-25 ENCOUNTER — Telehealth: Payer: Self-pay | Admitting: Cardiovascular Disease

## 2017-09-25 ENCOUNTER — Encounter (HOSPITAL_COMMUNITY): Payer: Self-pay

## 2017-09-25 ENCOUNTER — Other Ambulatory Visit: Payer: Self-pay | Admitting: *Deleted

## 2017-09-25 ENCOUNTER — Encounter: Payer: Self-pay | Admitting: Cardiovascular Disease

## 2017-09-25 DIAGNOSIS — R0602 Shortness of breath: Secondary | ICD-10-CM | POA: Diagnosis not present

## 2017-09-25 DIAGNOSIS — R062 Wheezing: Secondary | ICD-10-CM | POA: Insufficient documentation

## 2017-09-25 DIAGNOSIS — Z87891 Personal history of nicotine dependence: Secondary | ICD-10-CM | POA: Insufficient documentation

## 2017-09-25 DIAGNOSIS — J449 Chronic obstructive pulmonary disease, unspecified: Secondary | ICD-10-CM | POA: Insufficient documentation

## 2017-09-25 DIAGNOSIS — I739 Peripheral vascular disease, unspecified: Secondary | ICD-10-CM

## 2017-09-25 NOTE — Telephone Encounter (Signed)
New mssage    Pt is asking for a call back. He said he had a missed call.

## 2017-09-25 NOTE — Telephone Encounter (Signed)
Pt is due for follow up with Dr. Clifton JamesMcAlhany.  I spoke with pt's husband and scheduled appt for 09/28/17 at 9:00

## 2017-09-28 ENCOUNTER — Ambulatory Visit (INDEPENDENT_AMBULATORY_CARE_PROVIDER_SITE_OTHER): Payer: Medicare Other | Admitting: Cardiovascular Disease

## 2017-09-28 VITALS — BP 142/80 | HR 66 | Ht 61.5 in | Wt 127.0 lb

## 2017-09-28 DIAGNOSIS — I739 Peripheral vascular disease, unspecified: Secondary | ICD-10-CM | POA: Diagnosis not present

## 2017-09-28 DIAGNOSIS — I2510 Atherosclerotic heart disease of native coronary artery without angina pectoris: Secondary | ICD-10-CM

## 2017-09-28 NOTE — Patient Instructions (Signed)
Medication Instructions:  Your physician recommends that you continue on your current medications as directed. Please refer to the Current Medication list given to you today.   Labwork: none  Testing/Procedures: Your physician has requested that you have an ankle brachial index (ABI). During this test an ultrasound and blood pressure cuff are used to evaluate the arteries that supply the arms and legs with blood. Allow thirty minutes for this exam. There are no restrictions or special instructions. [facility]    Follow-Up: Your physician recommends that you schedule a follow-up appointment in: 12 months. Please call our office in about 9 months to schedule this appointment    Any Other Special Instructions Will Be Listed Below (If Applicable).     If you need a refill on your cardiac medications before your next appointment, please call your pharmacy.

## 2017-09-28 NOTE — Progress Notes (Signed)
Chief Complaint  Patient presents with  . Follow-up    PAD    History of Present Illness: 76 yo female with history of CAD, HTN, HLD, COPD, prior TIA and PAD who is here today for cardiac follow up. She had an inferior STEMI in 2011 treated with 3 drug eluting stents in the proximal, mid and distal RCA. She is known to have anomalous takeoff of all coronaries. She has PAD with abnormal LE arterial dopplers since May 2011. She was seen in 2011 by Dr. Hart Rochester but has had serial LE arterial testing in our office since. Last ABI  November 2017 showed ABI 0.76 on the right and 1.03 on the left. Nuclear stress test in October 2014 with no ischemia.    She is here today for follow up. The patient denies any dyspnea, palpitations, lower extremity edema, orthopnea, PND, dizziness, near syncope or syncope. She had one episode of chest pain last week. This occurred while in bed and awoke her from her sleep. The pain lasted for several minutes. She took 2 SL NTG. The pain was right sided. She has no exertional chest pain.   Primary Care Physician: Laurena Slimmer, MD  Past Medical History:  Diagnosis Date  . COPD (chronic obstructive pulmonary disease) (HCC)   . Hard of hearing   . History of anemia   . History of blood transfusion   . History of pneumonia   . HTN (hypertension)   . Hyperlipidemia   . Hyperthyroidism    treated wtih radioactive iodine  . OA (osteoarthritis of spine)   . PVD (peripheral vascular disease) (HCC)    Lower extremity Dopplers  May 2revealed an ABI of 0.83 in the right posterior   . Shortness of breath dyspnea   . STEMI (ST elevation myocardial infarction) [facility]) May 2011   Promus DES in the proximal, mid and distal RCA in May 2011  . Stroke (HCC)   . Urinary frequency   . Urinary urgency     Past Surgical History:  Procedure Laterality Date  . ABDOMINAL HYSTERECTOMY     Had ovarian resection and required lysis of adhesions  . CARDIAC CATHETERIZATION  04/07/2010     EF 60-70%  . CORONARY STENTS     x3  . ESOPHAGOGASTRODUODENOSCOPY N/A 03/10/2015   Procedure: ESOPHAGOGASTRODUODENOSCOPY (EGD);  Surgeon: Wandalee Ferdinand, MD;  Location: [facility] ENDOSCOPY;  Service: Endoscopy;  Laterality: N/A;  . OVARY SURGERY    . TOTAL HIP ARTHROPLASTY  2004   left  . TOTAL HIP ARTHROPLASTY Right 01/23/2016   Procedure: RIGHT TOTAL HIP ARTHROPLASTY ANTERIOR APPROACH;  Surgeon: Loreta Ave, MD;  Location: [facility] OR;  Service: Orthopedics;  Laterality: Right;    Current Outpatient Prescriptions  Medication Sig Dispense Refill  . Albuterol Sulfate (PROAIR HFA IN) Inhale into the lungs.    Ailene Ards ELLIPTA 62.5-25 MCG/INH AEPB Inhale 1 puff into the lungs daily.  3  . atorvastatin (LIPITOR) 20 MG tablet Take 1 tablet (20 mg total) by mouth daily. 90 tablet 3  . fluticasone (FLONASE) 50 MCG/ACT nasal spray USE 2 SPRAYS IN EACH NOSTRIL QD AT NIGHT  5  . hydrALAZINE (APRESOLINE) 25 MG tablet Take 1 tablet (25 mg total) by mouth daily. Please call and schedule a one year follow up appointment for further refills 1st attempt 30 tablet 0  . hydrochlorothiazide (HYDRODIURIL) 25 MG tablet TAKE 1 TABLET BY MOUTH DAILY 90 tablet 3  . methimazole (TAPAZOLE) 5 MG tablet Take 5  mg by mouth daily.    . metoprolol succinate (TOPROL-XL) 50 MG 24 hr tablet TAKE 1 TABLET BY MOUTH DAILY. TAKE WITH OR IMMEDIATELY FOLLOWING A MEAL 90 tablet 3  . nitroGLYCERIN (NITROSTAT) 0.4 MG SL tablet Place 1 tablet (0.4 mg total) under the tongue every 5 (five) minutes as needed for chest pain. 25 tablet 1  . predniSONE (STERAPRED UNI-PAK 48 TAB) 10 MG (48) TBPK tablet Take 1 tablet by mouth as directed. follow package directions  0  . PROAIR HFA 108 (90 BASE) MCG/ACT inhaler Inhale 1 puff into the lungs every 6 (six) hours as needed for wheezing or shortness of breath.     Laurann [place]. Tavaborole (KERYDIN) 5 % SOLN Apply 1 drop to each affected toenail once daily for 12 months 10 mL 11   No current facility-administered  medications for this visit.     Allergies  Allergen Reactions  . Ace Inhibitors Swelling    Angioedema of the tongue  . Iodinated Diagnostic Agents Anaphylaxis  . Other Anaphylaxis    Allergic to melons.   Marland Kitchen. Shellfish-Derived Products Anaphylaxis  . Hydrocodone Other (See Comments)    unspecified    Social History   Social History  . Marital status: Married    Spouse name: N/A  . Number of children: 3  . Years of education: N/A   Occupational History  . Retired    Social History Main Topics  . Smoking status: Former Smoker    Packs/day: 1.00    Years: 45.00    Types: Cigarettes    Quit date: 04/07/2010  . Smokeless tobacco: Never Used  . Alcohol use Yes     Comment: rarely   . Drug use: No  . Sexual activity: Not on file   Other Topics Concern  . Not on file   Social History Narrative  . No narrative on file    Family History  Problem Relation Age of Onset  . Peripheral vascular disease Mother   . Heart attack Father     Review of Systems:  As stated in the HPI and otherwise negative.   BP (!) 142/80   Pulse 66   Ht 5' 1.5" (1.562 m)   Wt 127 lb (57.6 kg)   SpO2 99%   BMI 23.61 kg/m   Physical Examination:  General: Well developed, well nourished, NAD  HEENT: OP clear, mucus membranes moist  SKIN: warm, dry. No rashes. Neuro: No focal deficits  Musculoskeletal: Muscle strength 5/5 all ext  Psychiatric: Mood and affect normal  Neck: No JVD, no carotid bruits, no thyromegaly, no lymphadenopathy.  Lungs:Clear bilaterally, no wheezes, rhonci, crackles Cardiovascular: Regular rate and rhythm. No murmurs, gallops or rubs. Abdomen:Soft. Bowel sounds present. Non-tender.  Extremities: No lower extremity edema. Pulses are trace in the bilateral DP/PT.  Stress myoview 10/05/13:  Stress ECG: No significant change from baseline ECG QPS Raw Data Images: Normal; no motion artifact; normal heart/lung ratio. Stress Images: Normal homogeneous uptake in  all areas of the myocardium. Rest Images: Normal homogeneous uptake in all areas of the myocardium. Subtraction (SDS): No evidence of ischemia. Transient Ischemic Dilatation (Normal <1.22): 0.97 Lung/Heart Ratio (Normal <0.45): 0.26 Impression Exercise Capacity: Lexiscan with no exercise. BP Response: Normal blood pressure response. Clinical Symptoms: Mild chest pain/dyspnea. ECG Impression: No significant ST segment change suggestive of ischemia. Comparison with Prior Nuclear Study: No previous nuclear study performed Overall Impression: Normal stress nuclear study. LV Ejection Fraction: 71%. LV Wall Motion: NL LV Function; NL  Wall Motion  EKG:  EKG is ordered today. The ekg ordered today demonstrates NSR, rate 66 bpm. RAE. Non-specific ST abnormality. Unchanged.   Recent Labs: No results found for requested labs within last 8760 hours.   Lipid Panel Lipid Panel     Component Value Date/Time   CHOL 154 07/31/2014 1153   TRIG 103.0 07/31/2014 1153   HDL 50.00 07/31/2014 1153   CHOLHDL 3 07/31/2014 1153   VLDL 20.6 07/31/2014 1153   LDLCALC 83 07/31/2014 1153   LDLDIRECT 138.9 07/23/2011 0815      Wt Readings from Last 3 Encounters:  09/28/17 127 lb (57.6 kg)  01/29/17 131 lb (59.4 kg)  12/17/16 131 lb (59.4 kg)     Other studies Reviewed: Additional studies/ records that were reviewed today include: . Review of the above records demonstrates:    Assessment and Plan:   1. PAD: She has no lifestyle limiting claudication, rest pain or ulcerations. Will continue conservative management of her PAD with yearly non-invasive testing. Repeat ABI now.   2. CAD: She has no chest pain that is suggestive of angina. Will continue ASA, statin and beta blocker.    Current medicines are reviewed at length with the patient today.  The patient does not have concerns regarding medicines.  The following changes have been made:  no change  Labs/ tests ordered today  include:   Orders Placed This Encounter  Procedures  . EKG 12-Lead    Disposition:   FU with me in 12  months  Signed, Verne Carrow, MD 09/28/2017 9:33 AM    [facility] [address] [place], [place], [place]  [address] Phone: [phone]; Fax: [phone]

## 2017-10-26 ENCOUNTER — Ambulatory Visit (HOSPITAL_COMMUNITY)
Admission: RE | Admit: 2017-10-26 | Discharge: 2017-10-26 | Disposition: A | Discharge status: Home / Self Care | Payer: Medicare Other | Source: Ambulatory Visit | Attending: Internal Medicine | Admitting: Internal Medicine

## 2017-10-26 DIAGNOSIS — I739 Peripheral vascular disease, unspecified: Secondary | ICD-10-CM | POA: Insufficient documentation

## 2017-10-28 ENCOUNTER — Telehealth: Payer: Self-pay | Admitting: Cardiovascular Disease

## 2017-10-28 NOTE — Telephone Encounter (Signed)
close

## 2017-11-10 ENCOUNTER — Encounter: Payer: Self-pay | Admitting: Cardiovascular Disease

## 2017-11-10 ENCOUNTER — Ambulatory Visit: Payer: Medicare Other | Admitting: Cardiovascular Disease

## 2017-11-10 DIAGNOSIS — I739 Peripheral vascular disease, unspecified: Secondary | ICD-10-CM

## 2017-11-10 NOTE — Progress Notes (Signed)
11/10/2017 Kristine ClockBertha O [name]   Jan 15, 1941  [id number]  Primary Physician Laurena Slimmerlark, Preston S, MD Primary Cardiologist: Runell GessJonathan J Berry MD Nicholes CalamityFACP, FACC, FAHA, [place]  HPI:  Kristine [name] is a 76 y.o. married African-American female mother of 3 mg R9 grandchildren referred by Dr. Clifton JamesMcAlhany  for peripheral vascular evaluation because of the pain. She has a history of CAD status post inferior STEMI S 11 treated with stenting of her RCA. She has treated hypertension and hyperlipidemia. She has had a stroke in the past as well. She stopped smoking at the time of her heart attack. She had Dopplers that showed ABIs of 0.68 on the right and 0.85 on the left suggesting tibial vessel disease. She really denies claudication but mostly complains of toe pain.   Current Meds  Medication Sig  . Albuterol Sulfate (PROAIR HFA IN) Inhale into the lungs.  Ailene Ards. ANORO ELLIPTA 62.5-25 MCG/INH AEPB Inhale 1 puff into the lungs daily.  Marland Kitchen. atorvastatin (LIPITOR) 20 MG tablet Take 1 tablet (20 mg total) by mouth daily.  . hydrALAZINE (APRESOLINE) 25 MG tablet Take 1 tablet (25 mg total) by mouth daily.  . hydrochlorothiazide (HYDRODIURIL) 25 MG tablet TAKE 1 TABLET BY MOUTH DAILY  . methimazole (TAPAZOLE) 5 MG tablet Take 5 mg by mouth daily.  . metoprolol succinate (TOPROL-XL) 50 MG 24 hr tablet TAKE 1 TABLET BY MOUTH DAILY. TAKE WITH OR IMMEDIATELY FOLLOWING A MEAL  . nitroGLYCERIN (NITROSTAT) 0.4 MG SL tablet Place 1 tablet (0.4 mg total) under the tongue every 5 (five) minutes as needed for chest pain.  Marland Kitchen. PROAIR HFA 108 (90 BASE) MCG/ACT inhaler Inhale 1 puff into the lungs every 6 (six) hours as needed for wheezing or shortness of breath.   . Tavaborole (KERYDIN) 5 % SOLN Apply 1 drop to each affected toenail once daily for 12 months     Allergies  Allergen Reactions  . Ace Inhibitors Swelling    Angioedema of the tongue  . Iodinated Diagnostic Agents Anaphylaxis  . Other Anaphylaxis    Allergic to melons.   Marland Kitchen.  Shellfish-Derived Products Anaphylaxis  . Hydrocodone Other (See Comments)    unspecified    Social History   Socioeconomic History  . Marital status: Married    Spouse name: Not on file  . Number of children: 3  . Years of education: Not on file  . Highest education level: Not on file  Social Needs  . Financial resource strain: Not on file  . Food insecurity - worry: Not on file  . Food insecurity - inability: Not on file  . Transportation needs - medical: Not on file  . Transportation needs - non-medical: Not on file  Occupational History  . Occupation: Retired  Tobacco Use  . Smoking status: Former Smoker    Packs/day: 1.00    Years: 45.00    Pack years: 45.00    Types: Cigarettes    Last attempt to quit: 04/07/2010    Years since quitting: 7.6  . Smokeless tobacco: Never Used  Substance and Sexual Activity  . Alcohol use: Yes    Comment: rarely   . Drug use: No  . Sexual activity: Not on file  Other Topics Concern  . Not on file  Social History Narrative  . Not on file     Review of Systems: General: negative for chills, fever, night sweats or weight changes.  Cardiovascular: negative for chest pain, dyspnea on exertion, edema, orthopnea, palpitations, paroxysmal nocturnal  dyspnea or shortness of breath Dermatological: negative for rash Respiratory: negative for cough or wheezing Urologic: negative for hematuria Abdominal: negative for nausea, vomiting, diarrhea, bright red blood per rectum, melena, or hematemesis Neurologic: negative for visual changes, syncope, or dizziness All other systems reviewed and are otherwise negative except as noted above.    Blood pressure (!) 150/88, pulse 76, height 5' 1.5" (1.562 m), weight 127 lb (57.6 kg).  General appearance: alert and no distress Neck: no adenopathy, no carotid bruit, no JVD, supple, symmetrical, trachea midline and thyroid not enlarged, symmetric, no tenderness/mass/nodules Lungs: clear to auscultation  bilaterally Heart: regular rate and rhythm, S1, S2 normal, no murmur, click, rub or gallop Extremities: extremities normal, atraumatic, no cyanosis or edema Pulses: 1+ pedal pulses bilaterally Skin: Skin color, texture, turgor normal. No rashes or lesions Neurologic: Alert and oriented X 3, normal strength and tone. Normal symmetric reflexes. Normal coordination and gait  EKG not performed today  ASSESSMENT AND PLAN:   Peripheral arterial disease (HCC) Ms. Kristine [name] was referred by Dr. Clifton JamesMcAlhany for evaluation of PAD. She has a history of CAD status post STEMI in 2011 with stenting of her RCA. In addition she has treated hypertension and hyperlipidemia. She complains of pain in her toes but really denies claudication. Recent lower extremity showed Doppler studies performed 10/27/17 revealed a right ABI of 0.68 and a left upper knee 5 suggesting tibial vessel disease. There is no evidence of critical limb ischemia. This point I do not think her symptoms are vascular nature and we'll see her back as needed.      Runell GessJonathan J. Berry MD FACP,FACC,FAHA, [facility], [facility]SCAI 11/10/2017 2:46 PM

## 2017-11-10 NOTE — Patient Instructions (Signed)
Medication Instructions: Your physician recommends that you continue on your current medications as directed. Please refer to the Current Medication list given to you today.   Follow-Up: Your physician recommends that you schedule a follow-up appointment as needed with Dr. Berry.    

## 2017-11-10 NOTE — Assessment & Plan Note (Signed)
Ms. Kristine [name] was referred by Dr. Clifton JamesMcAlhany for evaluation of PAD. She has a history of CAD status post STEMI in 2011 with stenting of her RCA. In addition she has treated hypertension and hyperlipidemia. She complains of pain in her toes but really denies claudication. Recent lower extremity showed Doppler studies performed 10/27/17 revealed a right ABI of 0.68 and a left upper knee 5 suggesting tibial vessel disease. There is no evidence of critical limb ischemia. This point I do not think her symptoms are vascular nature and we'll see her back as needed.

## 2017-11-27 ENCOUNTER — Ambulatory Visit: Payer: Medicare Other | Admitting: Podiatry

## 2018-01-05 ENCOUNTER — Ambulatory Visit: Payer: Medicare Other | Admitting: Podiatry

## 2018-01-05 ENCOUNTER — Encounter: Payer: Self-pay | Admitting: Podiatry

## 2018-01-05 DIAGNOSIS — B351 Tinea unguium: Secondary | ICD-10-CM

## 2018-01-05 DIAGNOSIS — M79676 Pain in unspecified toe(s): Secondary | ICD-10-CM

## 2018-01-05 DIAGNOSIS — Q828 Other specified congenital malformations of skin: Secondary | ICD-10-CM | POA: Diagnosis not present

## 2018-01-05 DIAGNOSIS — M2042 Other hammer toe(s) (acquired), left foot: Secondary | ICD-10-CM

## 2018-01-05 NOTE — Progress Notes (Signed)
Patient ID: Kristine ClockBertha O [name], female   DOB: 04/30/1941, 77 y.o.   MRN: [id number] Complaint:  Visit Type: Patient returns to my office for continued preventative foot care services. Complaint: Patient states" my nails have grown long and thick and become painful to walk and wear shoes"  She also has developed callus on left foot which hurts when walking.  She presents for preventative foot care services. No changes to ROS  Podiatric Exam: Vascular: dorsalis pedis and posterior tibial pulses are palpable bilateral. Capillary return is immediate. Temperature gradient is WNL. Skin turgor WNL  Sensorium: Normal Semmes Weinstein monofilament test. Normal tactile sensation bilaterally. Nail Exam: Pt has thick disfigured discolored nails with subungual debris noted bilateral entire nail hallux through fifth toenails Ulcer Exam: There is no evidence of ulcer or pre-ulcerative changes or infection. Orthopedic Exam: Muscle tone and strength are WNL. No limitations in general ROM. No crepitus or effusions noted. Foot type and digits show no abnormalities. Bony prominence unremarkable.. Skin:  Porokeratosis sub 3,4 left foot..  No infection or ulcers.  Heloma durum fifth toe left foot.  Diagnosis:  Tinea unguium, Pain in right toe, pain in left toes   Porokeratosis Left,  .Heloma durum fifth toe left foot.  Treatment & Plan Procedures and Treatment: Consent by patient was obtained for treatment procedures. The patient understood the discussion of treatment and procedures well. All questions were answered thoroughly reviewed. Debridement of mycotic and hypertrophic toenails, 1 through 5 bilateral and clearing of subungual debris. No ulceration, no infection noted. .  Debridement of porokeratosis left foot.  Debridement of corn fifth toe left foot. Return Visit-Office Procedure: Patient instructed to return to the office for a follow up visit 9 weeks for continued evaluation and treatment.  Helane GuntherGregory Colene Mines DPM

## 2018-02-12 ENCOUNTER — Emergency Department (HOSPITAL_COMMUNITY): Payer: Medicare Other

## 2018-02-12 ENCOUNTER — Encounter (HOSPITAL_COMMUNITY): Payer: Self-pay | Admitting: Emergency Medicine

## 2018-02-12 ENCOUNTER — Other Ambulatory Visit: Payer: Self-pay

## 2018-02-12 ENCOUNTER — Emergency Department (HOSPITAL_COMMUNITY)
Admission: EM | Admit: 2018-02-12 | Discharge: 2018-02-12 | Disposition: A | Discharge status: Home / Self Care | Payer: Medicare Other | Attending: Emergency Medicine | Admitting: Emergency Medicine

## 2018-02-12 DIAGNOSIS — R112 Nausea with vomiting, unspecified: Secondary | ICD-10-CM

## 2018-02-12 DIAGNOSIS — N390 Urinary tract infection, site not specified: Secondary | ICD-10-CM | POA: Diagnosis not present

## 2018-02-12 DIAGNOSIS — Z87891 Personal history of nicotine dependence: Secondary | ICD-10-CM | POA: Diagnosis not present

## 2018-02-12 DIAGNOSIS — K5289 Other specified noninfective gastroenteritis and colitis: Secondary | ICD-10-CM | POA: Insufficient documentation

## 2018-02-12 DIAGNOSIS — R197 Diarrhea, unspecified: Secondary | ICD-10-CM

## 2018-02-12 DIAGNOSIS — J449 Chronic obstructive pulmonary disease, unspecified: Secondary | ICD-10-CM | POA: Insufficient documentation

## 2018-02-12 DIAGNOSIS — K529 Noninfective gastroenteritis and colitis, unspecified: Secondary | ICD-10-CM

## 2018-02-12 DIAGNOSIS — Z79899 Other long term (current) drug therapy: Secondary | ICD-10-CM | POA: Insufficient documentation

## 2018-02-12 DIAGNOSIS — I10 Essential (primary) hypertension: Secondary | ICD-10-CM | POA: Diagnosis not present

## 2018-02-12 DIAGNOSIS — R109 Unspecified abdominal pain: Secondary | ICD-10-CM | POA: Diagnosis present

## 2018-02-12 LAB — URINALYSIS, ROUTINE W REFLEX MICROSCOPIC
BILIRUBIN URINE: NEGATIVE
Glucose, UA: NEGATIVE mg/dL
KETONES UR: NEGATIVE mg/dL
Nitrite: NEGATIVE
PH: 5 (ref 5.0–8.0)
Protein, ur: NEGATIVE mg/dL
SPECIFIC GRAVITY, URINE: 1.027 (ref 1.005–1.030)

## 2018-02-12 LAB — COMPREHENSIVE METABOLIC PANEL
ALT: 14 U/L (ref 14–54)
AST: 26 U/L (ref 15–41)
Albumin: 3.7 g/dL (ref 3.5–5.0)
Alkaline Phosphatase: 94 U/L (ref 38–126)
Anion gap: 11 (ref 5–15)
BILIRUBIN TOTAL: 0.9 mg/dL (ref 0.3–1.2)
BUN: 16 mg/dL (ref 6–20)
CHLORIDE: 101 mmol/L (ref 101–111)
CO2: 27 mmol/L (ref 22–32)
CREATININE: 0.7 mg/dL (ref 0.44–1.00)
Calcium: 9.3 mg/dL (ref 8.9–10.3)
Glucose, Bld: 115 mg/dL — ABNORMAL HIGH (ref 65–99)
Potassium: 3.7 mmol/L (ref 3.5–5.1)
Sodium: 139 mmol/L (ref 135–145)
TOTAL PROTEIN: 7.9 g/dL (ref 6.5–8.1)

## 2018-02-12 LAB — CBC
HCT: 41.9 % (ref 36.0–46.0)
Hemoglobin: 13.7 g/dL (ref 12.0–15.0)
MCH: 29.4 pg (ref 26.0–34.0)
MCHC: 32.7 g/dL (ref 30.0–36.0)
MCV: 89.9 fL (ref 78.0–100.0)
PLATELETS: 372 10*3/uL (ref 150–400)
RBC: 4.66 MIL/uL (ref 3.87–5.11)
RDW: 15.1 % (ref 11.5–15.5)
WBC: 4.5 10*3/uL (ref 4.0–10.5)

## 2018-02-12 LAB — TYPE AND SCREEN
ABO/RH(D): AB POS
ANTIBODY SCREEN: NEGATIVE
PT AG Type: NEGATIVE

## 2018-02-12 MED ORDER — CEPHALEXIN 500 MG PO CAPS: 500.0000 mg | ORAL_CAPSULE | Freq: Three times a day (TID) | ORAL | 0 refills | Status: DC

## 2018-02-12 MED ORDER — LOPERAMIDE HCL 2 MG PO CAPS
4.0000 mg | ORAL_CAPSULE | Freq: Once | ORAL | Status: AC
  Administered 2018-02-12: 4 mg via ORAL
  Filled 2018-02-12: qty 2

## 2018-02-12 MED ORDER — SODIUM CHLORIDE 0.9 % IV BOLUS (SEPSIS)
500.0000 mL | Freq: Once | INTRAVENOUS | Status: AC
  Administered 2018-02-12: 500 mL via INTRAVENOUS

## 2018-02-12 MED ORDER — BARIUM SULFATE 2.1 % PO SUSP
ORAL | Status: AC
  Filled 2018-02-12: qty 2

## 2018-02-12 MED ORDER — ONDANSETRON 8 MG PO TBDP: 8.0000 mg | ORAL_TABLET | Freq: Three times a day (TID) | ORAL | 0 refills | Status: DC | PRN

## 2018-02-12 MED ORDER — ONDANSETRON HCL 4 MG/2ML IJ SOLN
4.0000 mg | Freq: Once | INTRAMUSCULAR | Status: AC
  Administered 2018-02-12: 4 mg via INTRAVENOUS
  Filled 2018-02-12: qty 2

## 2018-02-12 MED ORDER — SODIUM CHLORIDE 0.9 % IV BOLUS (SEPSIS)
500.0000 mL | Freq: Once | INTRAVENOUS | Status: AC
Start: 2018-02-12 — End: 2018-02-12
  Administered 2018-02-12: 500 mL via INTRAVENOUS

## 2018-02-12 NOTE — ED Notes (Signed)
Pt was unable to provide urine sample. Will try again later.

## 2018-02-12 NOTE — ED Provider Notes (Addendum)
[facility] EMERGENCY DEPARTMENT Provider Note   CSN: [id number] Arrival date & time: 02/12/18  0907     History   Chief Complaint Chief Complaint  Patient presents with  . Abdominal Pain    HPI Kristine [name] is a 77 y.o. female.  Patient c/o mid abd pain in past week. Gradual onset, constant, persistent, mild, non radiating. Denies hx same. No specific exacerbating or alleviating factors. Episode of nausea and vomiting last night. Emesis not bloody or bilious. No abd distension. Denies dysuria or gu c/o. Is having normal bms - states darker in color, not black, no melena. Denies fever or chills. No back/flank pain.    The history is provided by the patient and a relative.  Abdominal Pain   Associated symptoms include vomiting. Pertinent negatives include fever, dysuria and headaches.    Past Medical History:  Diagnosis Date  . COPD (chronic obstructive pulmonary disease) (HCC)   . Hard of hearing   . History of anemia   . History of blood transfusion   . History of pneumonia   . HTN (hypertension)   . Hyperlipidemia   . Hyperthyroidism    treated wtih radioactive iodine  . OA (osteoarthritis of spine)   . PVD (peripheral vascular disease) (HCC)    Lower extremity Dopplers  May 2revealed an ABI of 0.83 in the right posterior   . Shortness of breath dyspnea   . STEMI (ST elevation myocardial infarction) [facility]) May 2011   Promus DES in the proximal, mid and distal RCA in May 2011  . Stroke (HCC)   . Urinary frequency   . Urinary urgency     Patient Active Problem List   Diagnosis Date Noted  . 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 Angioedema 05/31/2015  . Fever 03/11/2015  . Acute blood loss anemia 03/11/2015  . Melena 03/10/2015  . GI bleed 03/09/2015  . Peripheral arterial disease (HCC) 03/01/2012  . CAD (coronary artery disease) 01/20/2012  . HTN (hypertension)   .  Hyperlipidemia   . PVD (peripheral vascular disease) (HCC)   . COPD (chronic obstructive pulmonary disease) (HCC)   . Hyperthyroidism   . OA (osteoarthritis of spine)   . STEMI (ST elevation myocardial infarction) (HCC) 04/07/2010    Past Surgical History:  Procedure Laterality Date  . ABDOMINAL HYSTERECTOMY     Had ovarian resection and required lysis of adhesions  . CARDIAC CATHETERIZATION  04/07/2010   EF 60-70%  . CORONARY STENTS     x3  . ESOPHAGOGASTRODUODENOSCOPY N/A 03/10/2015   Procedure: ESOPHAGOGASTRODUODENOSCOPY (EGD);  Surgeon: Wandalee Ferdinand, MD;  Location: [facility] ENDOSCOPY;  Service: Endoscopy;  Laterality: N/A;  . OVARY SURGERY    . TOTAL HIP ARTHROPLASTY  2004   left  . TOTAL HIP ARTHROPLASTY Right 01/23/2016   Procedure: RIGHT TOTAL HIP ARTHROPLASTY ANTERIOR APPROACH;  Surgeon: Loreta Ave, MD;  [facility] OR;  Service: Orthopedics;  Laterality: Right;    OB History    No data available       Home Medications    Prior to Admission medications   Medication Sig Start Date End Date Taking? Authorizing Provider  Albuterol Sulfate (PROAIR HFA IN) Inhale into the lungs.    [provider]  ANORO ELLIPTA 62.5-25 MCG/INH AEPB Inhale 1 puff into the lungs daily. 09/25/17   [provider]  atorvastatin (LIPITOR) 20 MG tablet Take 1 tablet (20 mg total) by mouth  daily. 03/02/17   Kathleene Hazel, MD  hydrALAZINE (APRESOLINE) 25 MG tablet Take 1 tablet (25 mg total) by mouth daily. 09/28/17   Kathleene Hazel, MD  hydrochlorothiazide (HYDRODIURIL) 25 MG tablet TAKE 1 TABLET BY MOUTH DAILY 10/08/16   Kathleene Hazel, MD  methimazole (TAPAZOLE) 5 MG tablet Take 5 mg by mouth daily.    [provider]  metoprolol succinate (TOPROL-XL) 50 MG 24 hr tablet TAKE 1 TABLET BY MOUTH DAILY. TAKE WITH OR IMMEDIATELY FOLLOWING A MEAL 10/08/16   Kathleene Hazel, MD  nitroGLYCERIN (NITROSTAT) 0.4 MG SL tablet Place 1 tablet (0.4 mg  total) under the tongue every 5 (five) minutes as needed for chest pain. 03/31/17   Kathleene Hazel, MD  PROAIR HFA 108 (90 BASE) MCG/ACT inhaler Inhale 1 puff into the lungs every 6 (six) hours as needed for wheezing or shortness of breath.  10/31/13   [provider]  Tavaborole (KERYDIN) 5 % SOLN Apply 1 drop to each affected toenail once daily for 12 months 02/07/14   Alvan Dame, DPM    Family History Family History  Problem Relation Age of Onset  . Peripheral vascular disease Mother   . Heart attack Father     Social History Social History   Tobacco Use  . Smoking status: Former Smoker    Packs/day: 1.00    Years: 45.00    Pack years: 45.00    Types: Cigarettes    Last attempt to quit: 04/07/2010    Years since quitting: 7.8  . Smokeless tobacco: Never Used  Substance Use Topics  . Alcohol use: Yes    Comment: rarely   . Drug use: No     Allergies   Ace inhibitors; Iodinated diagnostic agents; Other; Shellfish-derived products; and Hydrocodone   Review of Systems Review of Systems  Constitutional: Negative for chills and fever.  HENT: Negative for sore throat.   Eyes: Negative for redness.  Respiratory: Negative for cough and shortness of breath.   Cardiovascular: Negative for chest pain.  Gastrointestinal: Positive for abdominal pain and vomiting.  Genitourinary: Negative for dysuria and flank pain.  Musculoskeletal: Negative for back pain.  Skin: Negative for rash.  Neurological: Negative for headaches.  Hematological: Does not bruise/bleed easily.  Psychiatric/Behavioral: Negative for confusion.     Physical Exam Updated Vital Signs BP 126/76   Pulse 77   Temp 98.6 F (37 C) (Oral)   Resp 17   Ht 1.562 m (5' 1.5")   Wt 58.5 kg (129 lb)   SpO2 99%   BMI 23.98 kg/m   Physical Exam  Constitutional: She appears well-developed and well-nourished. No distress.  HENT:  Mouth/Throat: Oropharynx is clear and moist.  Eyes:  Conjunctivae are normal. No scleral icterus.  Neck: Neck supple. No tracheal deviation present.  Cardiovascular: Normal rate, regular rhythm, normal heart sounds and intact distal pulses.  Pulmonary/Chest: Effort normal and breath sounds normal. No respiratory distress.  Abdominal: Soft. Normal appearance and bowel sounds are normal. She exhibits no distension. There is tenderness. There is no guarding.  Mid to left abd tenderness.   Genitourinary:  Genitourinary Comments: No cva tenderness  Musculoskeletal: She exhibits no edema.  Neurological: She is alert.  Skin: Skin is warm and dry. No rash noted. She is not diaphoretic.  Psychiatric: She has a normal mood and affect.  Nursing note and vitals reviewed.    ED Treatments / Results  Labs (all labs ordered are listed, but only abnormal  results are displayed) Results for orders placed or performed during the hospital encounter of 02/12/18  Comprehensive metabolic panel  Result Value Ref Range   Sodium 139 135 - 145 mmol/L   Potassium 3.7 3.5 - 5.1 mmol/L   Chloride 101 101 - 111 mmol/L   CO2 27 22 - 32 mmol/L   Glucose, Bld 115 (H) 65 - 99 mg/dL   BUN 16 6 - 20 mg/dL   Creatinine, Ser 6.960.70 0.44 - 1.00 mg/dL   Calcium 9.3 8.9 - 29.510.3 mg/dL   Total Protein 7.9 6.5 - 8.1 g/dL   Albumin 3.7 3.5 - 5.0 g/dL   AST 26 15 - 41 U/L   ALT 14 14 - 54 U/L   Alkaline Phosphatase 94 38 - 126 U/L   Total Bilirubin 0.9 0.3 - 1.2 mg/dL   GFR calc non Af Amer >60 >60 mL/min   GFR calc Af Amer >60 >60 mL/min   Anion gap 11 5 - 15  CBC  Result Value Ref Range   WBC 4.5 4.0 - 10.5 K/uL   RBC 4.66 3.87 - 5.11 MIL/uL   Hemoglobin 13.7 12.0 - 15.0 g/dL   HCT 28.441.9 13.236.0 - 44.046.0 %   MCV 89.9 78.0 - 100.0 fL   MCH 29.4 26.0 - 34.0 pg   MCHC 32.7 30.0 - 36.0 g/dL   RDW 10.215.1 72.511.5 - 36.615.5 %   Platelets 372 150 - 400 K/uL  Urinalysis, Routine w reflex microscopic  Result Value Ref Range   Color, Urine YELLOW YELLOW   APPearance CLEAR CLEAR    Specific Gravity, Urine 1.027 1.005 - 1.030   pH 5.0 5.0 - 8.0   Glucose, UA NEGATIVE NEGATIVE mg/dL   Hgb urine dipstick MODERATE (A) NEGATIVE   Bilirubin Urine NEGATIVE NEGATIVE   Ketones, ur NEGATIVE NEGATIVE mg/dL   Protein, ur NEGATIVE NEGATIVE mg/dL   Nitrite NEGATIVE NEGATIVE   Leukocytes, UA TRACE (A) NEGATIVE   RBC / HPF 6-30 0 - 5 RBC/hpf   WBC, UA 6-30 0 - 5 WBC/hpf   Bacteria, UA RARE (A) NONE SEEN   Squamous Epithelial / LPF 0-5 (A) NONE SEEN  Type and screen [facility]  Result Value Ref Range   ABO/RH(D) AB POS    Antibody Screen NEG    Sample Expiration 02/15/2018    Antibody Identification ANTI A1    PT AG Type      NEGATIVE FOR A1 ANTIGEN Performed at [facility], [address] [place], [place] [address]     EKG  EKG Interpretation None       Radiology Ct Abdomen Pelvis Wo Contrast  Result Date: 02/12/2018 CLINICAL DATA:  Lower abdominal pain radiating to RIGHT beginning last night. Diarrhea, nausea and vomiting. Dark stools for 1 week. EXAM: CT ABDOMEN AND PELVIS WITHOUT CONTRAST TECHNIQUE: Multidetector CT imaging of the abdomen and pelvis was performed following the standard protocol without IV contrast. Enteric contrast administered. COMPARISON:  None. FINDINGS: Mild motion degraded examination. LOWER CHEST: Stable sub solid 6 mm lingular sub pleural pulmonary nodule, benign. Heart size is normal. At least mild coronary artery calcifications, incompletely evaluated. Small amount of retained versus refluxed contrast in distal esophagus. HEPATOBILIARY: Normal. PANCREAS: Normal. SPLEEN: Normal. ADRENALS/URINARY TRACT: Kidneys are orthotopic, demonstrating normal size and morphology. No nephrolithiasis, hydronephrosis; limited assessment for renal masses on this nonenhanced examination. The unopacified ureters are normal in course and caliber. Urinary bladder is partially distended and unremarkable. Normal adrenal glands.  STOMACH/BOWEL:  The stomach, small and large bowel are normal in course and caliber without inflammatory changes, moderate to severe descending and sigmoid colonic diverticulosis, mild within remaining colon. Colonic air contrast levels. The appendix is not discretely identified, however there are no inflammatory changes in the right lower quadrant. VASCULAR/LYMPHATIC: Aortoiliac vessels are normal in course and caliber. Moderate to severe calcific atherosclerosis. No lymphadenopathy by CT size criteria. REPRODUCTIVE: Status post hysterectomy. OTHER: No intraperitoneal free fluid or free air. MUSCULOSKELETAL: Streak artifact from bilateral hip total arthroplasties. IMPRESSION: 1. Mild motion degraded examination. 2. Colonic air-contrast fluid levels with enteritis. Colonic diverticulosis without acute diverticulitis. Aortic Atherosclerosis (ICD10-I70.0). Electronically Signed   By: Awilda Metro M.D.   On: 02/12/2018 14:13    Procedures Procedures (including critical care time)  Medications Ordered in ED Medications  sodium chloride 0.9 % bolus 500 mL (not administered)  ondansetron (ZOFRAN) injection 4 mg (not administered)     Initial Impression / Assessment and Plan / ED Course  I have reviewed the triage vital signs and the nursing notes.  Pertinent labs & imaging results that were available during my care of the patient were reviewed by me and considered in my medical decision making (see chart for details).  Iv ns. Labs.   CT ordered.  Reviewed nursing notes and prior charts for additional history.   Additional iv ns bolus. Po fluids.  Ct reviewed - negative for acute process w exception of possible enteritis.   Recheck, tolerating po. No recurrent emesis. Pt with episodes of diarrhea in ED x 2. Pt denies recent antibiotic use or known ill contacts.   ?possible uti on labs. Will give rx.  Pt appears stable for d/c.     Final Clinical Impressions(s) / ED Diagnoses   Final diagnoses:    None    ED Discharge Orders    None             Cathren Laine, MD 02/12/18 1521

## 2018-02-12 NOTE — ED Notes (Addendum)
[facility] had [id number]

## 2018-02-12 NOTE — ED Notes (Signed)
Patient transported to CT 

## 2018-02-12 NOTE — ED Triage Notes (Signed)
Pt to ER for evaluation of lower abdominal pain beginning in the center and radiating to the right onset last night with diarrhea and n/v. Pt reports dark stool x 7-8 days. Pt in NAD. A/o x4.

## 2018-02-12 NOTE — ED Notes (Signed)
D/c reviewed with patient and spouse. No further questions at this time 

## 2018-02-12 NOTE — Discharge Instructions (Addendum)
It was our pleasure to provide your ER care today - we hope that you feel better.  Rest. Drink plenty of fluids.  You may take zofran as need if nauseated.   The lab tests show a possible urine infection p take antibiotic as prescribed.   You may try imodium as need for diarrhea.  Follow up with primary care doctor in the next 1-2 days if symptoms fail to improve/resolve.  Return to ER if worse, new symptoms, persistent vomiting, severe abdominal pain, other concern.

## 2018-02-12 NOTE — ED Notes (Signed)
Pt ambulatory to restroom. Steady gait noted.

## 2018-02-17 ENCOUNTER — Encounter (HOSPITAL_COMMUNITY): Payer: Self-pay

## 2018-02-17 ENCOUNTER — Other Ambulatory Visit: Payer: Self-pay

## 2018-02-17 ENCOUNTER — Emergency Department (HOSPITAL_COMMUNITY): Payer: Medicare Other

## 2018-02-17 ENCOUNTER — Emergency Department (HOSPITAL_COMMUNITY)
Admission: EM | Admit: 2018-02-17 | Discharge: 2018-02-17 | Disposition: A | Discharge status: Home / Self Care | Payer: Medicare Other | Attending: Emergency Medicine | Admitting: Emergency Medicine

## 2018-02-17 DIAGNOSIS — K5900 Constipation, unspecified: Secondary | ICD-10-CM | POA: Insufficient documentation

## 2018-02-17 DIAGNOSIS — Z7982 Long term (current) use of aspirin: Secondary | ICD-10-CM | POA: Insufficient documentation

## 2018-02-17 DIAGNOSIS — R197 Diarrhea, unspecified: Secondary | ICD-10-CM | POA: Diagnosis not present

## 2018-02-17 DIAGNOSIS — Z87891 Personal history of nicotine dependence: Secondary | ICD-10-CM | POA: Diagnosis not present

## 2018-02-17 DIAGNOSIS — I10 Essential (primary) hypertension: Secondary | ICD-10-CM | POA: Diagnosis not present

## 2018-02-17 DIAGNOSIS — Z79899 Other long term (current) drug therapy: Secondary | ICD-10-CM | POA: Diagnosis not present

## 2018-02-17 DIAGNOSIS — Z8673 Personal history of transient ischemic attack (TIA), and cerebral infarction without residual deficits: Secondary | ICD-10-CM | POA: Insufficient documentation

## 2018-02-17 DIAGNOSIS — R112 Nausea with vomiting, unspecified: Secondary | ICD-10-CM | POA: Insufficient documentation

## 2018-02-17 DIAGNOSIS — I252 Old myocardial infarction: Secondary | ICD-10-CM | POA: Insufficient documentation

## 2018-02-17 DIAGNOSIS — Z91013 Allergy to seafood: Secondary | ICD-10-CM | POA: Diagnosis not present

## 2018-02-17 DIAGNOSIS — J449 Chronic obstructive pulmonary disease, unspecified: Secondary | ICD-10-CM | POA: Diagnosis not present

## 2018-02-17 DIAGNOSIS — R109 Unspecified abdominal pain: Secondary | ICD-10-CM | POA: Diagnosis present

## 2018-02-17 LAB — COMPREHENSIVE METABOLIC PANEL
ALBUMIN: 3.9 g/dL (ref 3.5–5.0)
ALT: 16 U/L (ref 14–54)
AST: 29 U/L (ref 15–41)
Alkaline Phosphatase: 89 U/L (ref 38–126)
Anion gap: 12 (ref 5–15)
BUN: 19 mg/dL (ref 6–20)
CHLORIDE: 99 mmol/L — AB (ref 101–111)
CO2: 26 mmol/L (ref 22–32)
CREATININE: 0.84 mg/dL (ref 0.44–1.00)
Calcium: 9.5 mg/dL (ref 8.9–10.3)
GFR calc Af Amer: >60 mL/min (ref >60)
GLUCOSE: 106 mg/dL — AB (ref 65–99)
Potassium: 4.1 mmol/L (ref 3.5–5.1)
Sodium: 137 mmol/L (ref 135–145)
Total Bilirubin: 0.6 mg/dL (ref 0.3–1.2)
Total Protein: 8 g/dL (ref 6.5–8.1)

## 2018-02-17 LAB — CBC
HEMATOCRIT: 42.8 % (ref 36.0–46.0)
Hemoglobin: 14.1 g/dL (ref 12.0–15.0)
MCH: 29.3 pg (ref 26.0–34.0)
MCHC: 32.9 g/dL (ref 30.0–36.0)
MCV: 88.8 fL (ref 78.0–100.0)
PLATELETS: 292 10*3/uL (ref 150–400)
RBC: 4.82 MIL/uL (ref 3.87–5.11)
RDW: 14.6 % (ref 11.5–15.5)
WBC: 6.6 10*3/uL (ref 4.0–10.5)

## 2018-02-17 LAB — URINALYSIS, ROUTINE W REFLEX MICROSCOPIC
Bilirubin Urine: NEGATIVE
GLUCOSE, UA: NEGATIVE mg/dL
HGB URINE DIPSTICK: NEGATIVE
Ketones, ur: NEGATIVE mg/dL
Leukocytes, UA: NEGATIVE
Nitrite: NEGATIVE
PH: 6 (ref 5.0–8.0)
Protein, ur: NEGATIVE mg/dL
Specific Gravity, Urine: 1.029 (ref 1.005–1.030)

## 2018-02-17 LAB — LIPASE, BLOOD: LIPASE: 37 U/L (ref 11–51)

## 2018-02-17 MED ORDER — MORPHINE SULFATE (PF) 4 MG/ML IV SOLN
4.0000 mg | Freq: Once | INTRAVENOUS | Status: AC
  Administered 2018-02-17: 4 mg via INTRAVENOUS
  Filled 2018-02-17: qty 1

## 2018-02-17 MED ORDER — ONDANSETRON HCL 4 MG/2ML IJ SOLN
4.0000 mg | Freq: Once | INTRAMUSCULAR | Status: AC
  Administered 2018-02-17: 4 mg via INTRAVENOUS
  Filled 2018-02-17: qty 2

## 2018-02-17 MED ORDER — POLYETHYLENE GLYCOL 3350 17 G PO PACK: 17.0000 g | PACK | Freq: Every day | ORAL | 0 refills | Status: DC

## 2018-02-17 MED ORDER — SODIUM CHLORIDE 0.9 % IV BOLUS (SEPSIS)
1000.0000 mL | Freq: Once | INTRAVENOUS | Status: AC
  Administered 2018-02-17: 1000 mL via INTRAVENOUS

## 2018-02-17 NOTE — ED Notes (Signed)
Pt unable to have a BM while here at the hospital today.

## 2018-02-17 NOTE — ED Notes (Signed)
Pt stable, ambulatory, and verbalizes understanding of d/c instructions.  

## 2018-02-17 NOTE — ED Provider Notes (Signed)
[facility] Provider Note   CSN: [id number] Arrival date & time: 02/17/18  [id number]     History   Chief Complaint Chief Complaint  Patient presents with  . Abdominal Pain    HPI Kristine [name] is a 77 y.o. female.  Pt presents to the ED today with abdominal pain, n/v/d.  She was seen here on 3/8 for the same.  She had a ct scan which unremarkable.  Pt was feeling better, but then started having same sx today.  Pt denies f/c.      Past Medical History:  Diagnosis Date  . COPD (chronic obstructive pulmonary disease) (HCC)   . Hard of hearing   . History of anemia   . History of blood transfusion   . History of pneumonia   . HTN (hypertension)   . Hyperlipidemia   . Hyperthyroidism    treated wtih radioactive iodine  . OA (osteoarthritis of spine)   . PVD (peripheral vascular disease) (HCC)    Lower extremity Dopplers  May 2revealed an ABI of 0.83 in the right posterior   . Shortness of breath dyspnea   . STEMI (ST elevation myocardial infarction) [facility]) May 2011   Promus DES in the proximal, mid and distal RCA in May 2011  . Stroke (HCC)   . Urinary frequency   . Urinary urgency     Patient Active Problem List   Diagnosis Date Noted  . 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 Angioedema 05/31/2015  . Fever 03/11/2015  . Acute blood loss anemia 03/11/2015  . Melena 03/10/2015  . GI bleed 03/09/2015  . Peripheral arterial disease (HCC) 03/01/2012  . CAD (coronary artery disease) 01/20/2012  . HTN (hypertension)   . Hyperlipidemia   . PVD (peripheral vascular disease) (HCC)   . COPD (chronic obstructive pulmonary disease) (HCC)   . Hyperthyroidism   . OA (osteoarthritis of spine)   . STEMI (ST elevation myocardial infarction) (HCC) 04/07/2010    Past Surgical History:  Procedure Laterality Date  . ABDOMINAL HYSTERECTOMY     Had ovarian resection and required  lysis of adhesions  . CARDIAC CATHETERIZATION  04/07/2010   EF 60-70%  . CORONARY STENTS     x3  . ESOPHAGOGASTRODUODENOSCOPY N/A 03/10/2015   Procedure: ESOPHAGOGASTRODUODENOSCOPY (EGD);  Surgeon: Wandalee FerdinandSam Ganem, MD;  [facility];  Service: Endoscopy;  Laterality: N/A;  . OVARY SURGERY    . TOTAL HIP ARTHROPLASTY  2004   left  . TOTAL HIP ARTHROPLASTY Right 01/23/2016   Procedure: RIGHT TOTAL HIP ARTHROPLASTY ANTERIOR APPROACH;  Surgeon: Loreta Aveaniel F Murphy, MD;  [facility];  Service: Orthopedics;  Laterality: Right;    OB History    No data available       Home Medications    Prior to Admission medications   Medication Sig Start Date End Date Taking? Authorizing Provider  acetaminophen (TYLENOL) 325 MG tablet Take 650 mg by mouth every 6 (six) hours as needed for mild pain.    [provider]  albuterol (PROVENTIL HFA;VENTOLIN HFA) 108 (90 Base) MCG/ACT inhaler Inhale 1 puff into the lungs every 6 (six) hours as needed for wheezing or shortness of breath.    [provider]  ANORO ELLIPTA 62.5-25 MCG/INH AEPB Inhale 1 puff into the lungs daily. 09/25/17   [provider]  aspirin EC 81 MG tablet Take 81 mg by mouth daily.    [provider]  atorvastatin (LIPITOR) 20 MG tablet Take 1 tablet (20 mg total) by mouth daily. 03/02/17   Kathleene Hazel, MD  cephALEXin (KEFLEX) 500 MG capsule Take 1 capsule (500 mg total) by mouth 3 (three) times daily. 02/12/18   Cathren Laine, MD  hydrALAZINE (APRESOLINE) 25 MG tablet Take 1 tablet (25 mg total) by mouth daily. 09/28/17   Kathleene Hazel, MD  hydrochlorothiazide (HYDRODIURIL) 25 MG tablet TAKE 1 TABLET BY MOUTH DAILY Patient taking differently: 25mg  by mouth once daily 10/08/16   Kathleene Hazel, MD  methimazole (TAPAZOLE) 5 MG tablet Take 5 mg by mouth daily.    [provider]  metoprolol succinate (TOPROL-XL) 50 MG 24 hr tablet TAKE 1 TABLET BY MOUTH DAILY. TAKE WITH  OR IMMEDIATELY FOLLOWING A MEAL 10/08/16   Kathleene Hazel, MD  nitroGLYCERIN (NITROSTAT) 0.4 MG SL tablet Place 1 tablet (0.4 mg total) under the tongue every 5 (five) minutes as needed for chest pain. 03/31/17   Kathleene Hazel, MD  ondansetron (ZOFRAN ODT) 8 MG disintegrating tablet Take 1 tablet (8 mg total) by mouth every 8 (eight) hours as needed for nausea or vomiting. 02/12/18   Cathren Laine, MD  polyethylene glycol [facility]) packet Take 17 g by mouth daily. 02/17/18   Jacalyn Lefevre, MD  Tavaborole (KERYDIN) 5 % SOLN Apply 1 drop to each affected toenail once daily for 12 months 02/07/14   Alvan Dame, DPM    Family History Family History  Problem Relation Age of Onset  . Peripheral vascular disease Mother   . Heart attack Father     Social History Social History   Tobacco Use  . Smoking status: Former Smoker    Packs/day: 1.00    Years: 45.00    Pack years: 45.00    Types: Cigarettes    Last attempt to quit: 04/07/2010    Years since quitting: 7.8  . Smokeless tobacco: Never Used  Substance Use Topics  . Alcohol use: Yes    Comment: rarely   . Drug use: No     Allergies   Ace inhibitors; Iodinated diagnostic agents; Other; Shellfish-derived products; and Hydrocodone   Review of Systems Review of Systems  Gastrointestinal: Positive for abdominal pain, diarrhea, nausea and vomiting.  All other systems reviewed and are negative.    Physical Exam Updated Vital Signs BP 120/71   Pulse 71   Temp 97.7 F (36.5 C) (Oral)   Resp 16   Ht 5' 1.5" (1.562 m)   Wt 58.5 kg (129 lb)   SpO2 98%   BMI 23.98 kg/m   Physical Exam  Constitutional: She is oriented to person, place, and time. She appears well-developed and well-nourished.  HENT:  Head: Normocephalic and atraumatic.  Mouth/Throat: Oropharynx is clear and moist.  Eyes: EOM are normal. Pupils are equal, round, and reactive to light.  Cardiovascular: Normal rate, regular rhythm, normal heart  sounds and intact distal pulses.  Pulmonary/Chest: Effort normal and breath sounds normal.  Abdominal: Soft. Normal appearance. Bowel sounds are decreased. There is generalized tenderness.  Neurological: She is alert and oriented to person, place, and time.  Skin: Skin is warm and dry. Capillary refill takes less than 2 seconds.  Psychiatric: She has a normal mood and affect. Her behavior is normal.  Nursing note and vitals reviewed.    ED Treatments / Results  Labs (all labs ordered are listed, but only abnormal results are displayed) Labs Reviewed  COMPREHENSIVE METABOLIC PANEL - Abnormal;  Notable for the following components:      Result Value   Chloride 99 (*)    Glucose, Bld 106 (*)    All other components within normal limits  GASTROINTESTINAL PANEL BY PCR, STOOL (REPLACES STOOL CULTURE)  C DIFFICILE QUICK SCREEN W PCR REFLEX  LIPASE, BLOOD  CBC  URINALYSIS, ROUTINE W REFLEX MICROSCOPIC    EKG  EKG Interpretation None       Radiology Dg Abdomen Acute W/chest  Result Date: 02/17/2018 CLINICAL DATA:  Abdominal pain EXAM: DG ABDOMEN ACUTE W/ 1V CHEST COMPARISON:  CT abdomen and pelvis February 12, 2018. FINDINGS: PA chest: No edema or consolidation. Heart size and pulmonary vascularity are normal. No adenopathy. There is aortic atherosclerosis. Supine and upright abdomen: There are multiple contrast filled diverticula throughout the left colon and sigmoid colon regions. There is moderate stool in the colon. There is no bowel dilatation or air-fluid level to suggest bowel obstruction. No free air. There are phleboliths in the pelvis. There are total hip replacements bilaterally. IMPRESSION: Multiple left-sided colonic diverticula without inflammation seen. Moderate stool in colon. No bowel obstruction or free air evident. Electronically Signed   By: Bretta Bang III M.D.   On: 02/17/2018 12:32    Procedures Procedures (including critical care time)  Medications Ordered  in ED Medications  sodium chloride 0.9 % bolus 1,000 mL (1,000 mLs Intravenous New Bag/Given 02/17/18 1120)  ondansetron (ZOFRAN) injection 4 mg (4 mg Intravenous Given 02/17/18 1127)  morphine 4 MG/ML injection 4 mg (4 mg Intravenous Given 02/17/18 1127)     Initial Impression / Assessment and Plan / ED Course  I have reviewed the triage vital signs and the nursing notes.  Pertinent labs & imaging results that were available during my care of the patient were reviewed by me and considered in my medical decision making (see chart for details).     Pt is feeling much better.  She is able to tolerate po fluids.  Her xray still shows contrast from 3/8 in her descending colon.  I suspect the diarrhea is from liquid moving around the large amount of stool.  Pt is encouraged to increase fiber and eat healthier foods.  Pt is stable for d/c.  Final Clinical Impressions(s) / ED Diagnoses   Final diagnoses:  Constipation, unspecified constipation type    ED Discharge Orders        Ordered    polyethylene glycol (MIRALAX) packet  Daily     02/17/18 1418       Jacalyn Lefevre, MD 02/17/18 1423

## 2018-02-17 NOTE — ED Triage Notes (Signed)
Pt presents for lower abd pain with nausea/vomiting/diarrhea starting this AM. Pt seen Friday for the same but improved over the weekend. Pt reports was constipated through weekend so tried to eat yogurt to help and now feels worse.

## 2018-02-17 NOTE — Discharge Instructions (Signed)
OTC fiber like metamucil

## 2018-03-08 ENCOUNTER — Other Ambulatory Visit: Payer: Self-pay | Admitting: Family Medicine

## 2018-03-08 DIAGNOSIS — J449 Chronic obstructive pulmonary disease, unspecified: Secondary | ICD-10-CM

## 2018-03-08 DIAGNOSIS — Z87891 Personal history of nicotine dependence: Secondary | ICD-10-CM

## 2018-03-08 DIAGNOSIS — Z1231 Encounter for screening mammogram for malignant neoplasm of breast: Secondary | ICD-10-CM

## 2018-03-09 ENCOUNTER — Ambulatory Visit: Payer: Medicare Other | Admitting: Podiatry

## 2018-03-09 ENCOUNTER — Encounter: Payer: Self-pay | Admitting: Podiatry

## 2018-03-09 DIAGNOSIS — Q828 Other specified congenital malformations of skin: Secondary | ICD-10-CM | POA: Diagnosis not present

## 2018-03-09 DIAGNOSIS — M79676 Pain in unspecified toe(s): Secondary | ICD-10-CM | POA: Diagnosis not present

## 2018-03-09 DIAGNOSIS — B351 Tinea unguium: Secondary | ICD-10-CM | POA: Diagnosis not present

## 2018-03-09 NOTE — Progress Notes (Signed)
Patient ID: [name], female   DOB: 11-Apr-1941, 77 y.o.   MRN: [id number] Complaint:  Visit Type: Patient returns to my office for continued preventative foot care services. Complaint: Patient states" my nails have grown long and thick and become painful to walk and wear shoes"  She also has developed callus on left foot which hurts when walking.  She presents for preventative foot care services. No changes to ROS  Podiatric Exam: Vascular: dorsalis pedis and posterior tibial pulses are palpable bilateral. Capillary return is immediate. Temperature gradient is WNL. Skin turgor WNL  Sensorium: Normal Semmes Weinstein monofilament test. Normal tactile sensation bilaterally. Nail Exam: Pt has thick disfigured discolored nails with subungual debris noted bilateral entire nail hallux through fifth toenails Ulcer Exam: There is no evidence of ulcer or pre-ulcerative changes or infection. Orthopedic Exam: Muscle tone and strength are WNL. No limitations in general ROM. No crepitus or effusions noted. Foot type and digits show no abnormalities. Bony prominence unremarkable.. Skin:  Porokeratosis sub 3,4 left foot..  No infection or ulcers.  Heloma durum fifth toe left foot asymptomatic  Diagnosis:  Tinea unguium, Pain in right toe, pain in left toes   Porokeratosis Left,  .  Treatment & Plan Procedures and Treatment: Consent by patient was obtained for treatment procedures. The patient understood the discussion of treatment and procedures well. All questions were answered thoroughly reviewed. Debridement of mycotic and hypertrophic toenails, 1 through 5 bilateral and clearing of subungual debris. No ulceration, no infection noted. .  Debridement of porokeratosis left foot.  Return Visit-Office Procedure: Patient instructed to return to the office for a follow up visit 9 weeks for continued evaluation and treatment.  Helane GuntherGregory Adriel Kessen DPM

## 2018-03-17 ENCOUNTER — Other Ambulatory Visit: Payer: Self-pay | Admitting: Cardiovascular Disease

## 2018-03-18 ENCOUNTER — Other Ambulatory Visit: Payer: Self-pay | Admitting: Family Medicine

## 2018-03-18 ENCOUNTER — Ambulatory Visit
Admission: RE | Admit: 2018-03-18 | Discharge: 2018-03-18 | Disposition: A | Discharge status: Home / Self Care | Payer: Medicare Other | Source: Ambulatory Visit | Attending: Family Medicine | Admitting: Family Medicine

## 2018-03-18 DIAGNOSIS — Z87891 Personal history of nicotine dependence: Secondary | ICD-10-CM

## 2018-03-18 DIAGNOSIS — J449 Chronic obstructive pulmonary disease, unspecified: Secondary | ICD-10-CM

## 2018-04-09 ENCOUNTER — Ambulatory Visit: Payer: Medicare Other

## 2018-04-12 ENCOUNTER — Ambulatory Visit
Admission: RE | Admit: 2018-04-12 | Discharge: 2018-04-12 | Disposition: A | Discharge status: Home / Self Care | Payer: Medicare Other | Source: Ambulatory Visit | Attending: Family Medicine | Admitting: Family Medicine

## 2018-04-12 DIAGNOSIS — Z1231 Encounter for screening mammogram for malignant neoplasm of breast: Secondary | ICD-10-CM

## 2018-05-19 ENCOUNTER — Encounter: Payer: Self-pay | Admitting: Podiatry

## 2018-05-19 ENCOUNTER — Ambulatory Visit: Payer: Medicare Other | Admitting: Podiatry

## 2018-05-19 DIAGNOSIS — B351 Tinea unguium: Secondary | ICD-10-CM

## 2018-05-19 DIAGNOSIS — Q828 Other specified congenital malformations of skin: Secondary | ICD-10-CM | POA: Diagnosis not present

## 2018-05-19 NOTE — Progress Notes (Addendum)
Patient ID: Kristine [name], female   DOB: [birth year], 77 y.o.   MRN: [id number] Complaint:  Visit Type: Patient returns to my office for continued preventative foot care services. Complaint: Patient states" my nails have grown long and thick and become painful to walk and wear shoes"  She also has developed callus on left foot which hurts when walking.  She presents for preventative foot care services. No changes to ROS  Podiatric Exam: Vascular: dorsalis pedis and posterior tibial pulses are palpable bilateral. Capillary return is immediate. Temperature gradient is WNL. Skin turgor WNL  Sensorium: Normal Semmes Weinstein monofilament test. Normal tactile sensation bilaterally. Nail Exam: Pt has thick disfigured discolored nails with subungual debris noted bilateral entire nail hallux through fifth toenails Ulcer Exam: There is no evidence of ulcer or pre-ulcerative changes or infection. Orthopedic Exam: Muscle tone and strength are WNL. No limitations in general ROM. No crepitus or effusions noted. Foot type and digits show no abnormalities. Bony prominence unremarkable.. Skin:  Porokeratosis sub 3,4 left foot..  No infection or ulcers.  Heloma durum fifth toe left foot asymptomatic  Diagnosis:  Onychomycosis  B/L Pain in right toe, pain in left toes   Porokeratosis Left,  .  Treatment & Plan Procedures and Treatment: Consent by patient was obtained for treatment procedures. The patient understood the discussion of treatment and procedures well. All questions were answered thoroughly reviewed. Debridement of mycotic and hypertrophic toenails, 1 through 5 bilateral and clearing of subungual debris. No ulceration, no infection noted. .  Debridement of porokeratosis left foot. Debridement of HD 5th left. Return Visit-Office Procedure: Patient instructed to return to the office for a follow up visit 9 weeks for continued evaluation and treatment.  Helane GuntherGregory Felipe Paluch DPM

## 2018-07-28 ENCOUNTER — Encounter: Payer: Self-pay | Admitting: Podiatry

## 2018-07-28 ENCOUNTER — Ambulatory Visit: Payer: Medicare Other | Admitting: Podiatry

## 2018-07-28 DIAGNOSIS — Q828 Other specified congenital malformations of skin: Secondary | ICD-10-CM | POA: Diagnosis not present

## 2018-07-28 DIAGNOSIS — M79676 Pain in unspecified toe(s): Secondary | ICD-10-CM | POA: Diagnosis not present

## 2018-07-28 DIAGNOSIS — M2042 Other hammer toe(s) (acquired), left foot: Secondary | ICD-10-CM | POA: Diagnosis not present

## 2018-07-28 DIAGNOSIS — B351 Tinea unguium: Secondary | ICD-10-CM | POA: Diagnosis not present

## 2018-07-28 NOTE — Progress Notes (Signed)
Patient ID: Kristine [name], female   DOB: 02/25/1941, 77 y.o.   MRN: [id number] Complaint:  Visit Type: Patient returns to my office for continued preventative foot care services. Complaint: Patient states" my nails have grown long and thick and become painful to walk and wear shoes"  She also has developed callus on left foot which hurts when walking.  She presents for preventative foot care services. No changes to ROS  Podiatric Exam: Vascular: dorsalis pedis and posterior tibial pulses are palpable bilateral. Capillary return is immediate. Temperature gradient is WNL. Skin turgor WNL  Sensorium: Normal Semmes Weinstein monofilament test. Normal tactile sensation bilaterally. Nail Exam: Pt has thick disfigured discolored nails with subungual debris noted bilateral entire nail hallux through fifth toenails Ulcer Exam: There is no evidence of ulcer or pre-ulcerative changes or infection. Orthopedic Exam: Muscle tone and strength are WNL. No limitations in general ROM. No crepitus or effusions noted. Foot type and digits show no abnormalities. Bony prominence unremarkable.. Skin:  Porokeratosis sub 3,4 left foot..  No infection or ulcers.  Heloma durum fifth toe left foot asymptomatic  Diagnosis:  Onychomycosis  B/L Pain in right toe, pain in left toes   Porokeratosis Left,  .  Treatment & Plan Procedures and Treatment: Consent by patient was obtained for treatment procedures. The patient understood the discussion of treatment and procedures well. All questions were answered thoroughly reviewed. Debridement of mycotic and hypertrophic toenails, 1 through 5 bilateral and clearing of subungual debris. No ulceration, no infection noted. .  Debridement of porokeratosis left foot. Debridement of HD 5th left. Return Visit-Office Procedure: Patient instructed to return to the office for a follow up visit 9 weeks for continued evaluation and treatment.  Caspar Favila DPM 

## 2018-10-09 ENCOUNTER — Other Ambulatory Visit: Payer: Self-pay | Admitting: Cardiovascular Disease

## 2018-10-12 NOTE — Progress Notes (Signed)
Chief Complaint  Patient presents with  . Follow-up    CAD    History of Present Illness: 77 yo female with history of CAD, HTN, HLD, COPD, prior TIA and PAD who is here today for cardiac follow up. She had an inferior STEMI in 2011 treated with 3 drug eluting stents in the proximal, mid and distal RCA. She is known to have anomalous takeoff of all coronaries. Nuclear stress test in October 2014 with no ischemia. She has PAD with abnormal LE arterial dopplers since May 2011. She was seen in 2011 by Dr. Hart Rochester but has had serial LE arterial testing in our office since. Last ABI  November 2018 showed ABI 0.68 on the right and 0.85 on the left. She was seen in our [facility] clinic by Dr. Allyson Sabal December 2018 and he felt that her toe pain was not vascular in nature. He planned follow up as needed in the [facility] clinic.   She is here today for follow up. The patient denies any dyspnea, palpitations, lower extremity edema, orthopnea, PND, dizziness, near syncope or syncope. Her feet hurt in the evenings. She has no claudication. Rare chest pains. She has used NTG once over the past year when she had chest pain while working hard in her garage.   Primary Care Physician: Renaye Rakers, MD  Past Medical History:  Diagnosis Date  . COPD (chronic obstructive pulmonary disease) (HCC)   . Hard of hearing   . History of anemia   . History of blood transfusion   . History of pneumonia   . HTN (hypertension)   . Hyperlipidemia   . Hyperthyroidism    treated wtih radioactive iodine  . OA (osteoarthritis of spine)   . PVD (peripheral vascular disease) (HCC)    Lower extremity Dopplers  May 2revealed an ABI of 0.83 in the right posterior   . Shortness of breath dyspnea   . STEMI (ST elevation myocardial infarction) [facility]) May 2011   Promus DES in the proximal, mid and distal RCA in May 2011  . Stroke (HCC)   . Urinary frequency   . Urinary urgency     Past Surgical History:  Procedure Laterality Date  .  ABDOMINAL HYSTERECTOMY     Had ovarian resection and required lysis of adhesions  . CARDIAC CATHETERIZATION  04/07/2010   EF 60-70%  . CORONARY STENTS     x3  . ESOPHAGOGASTRODUODENOSCOPY N/A 03/10/2015   Procedure: ESOPHAGOGASTRODUODENOSCOPY (EGD);  Surgeon: Wandalee Ferdinand, MD;  Location: [facility] ENDOSCOPY;  Service: Endoscopy;  Laterality: N/A;  . OVARY SURGERY    . TOTAL HIP ARTHROPLASTY  2004   left  . TOTAL HIP ARTHROPLASTY Right 01/23/2016   Procedure: RIGHT TOTAL HIP ARTHROPLASTY ANTERIOR APPROACH;  Surgeon: Loreta Ave, MD;  Location: [facility] OR;  Service: Orthopedics;  Laterality: Right;    Current Outpatient Medications  Medication Sig Dispense Refill  . acetaminophen (TYLENOL) 325 MG tablet Take 650 mg by mouth every 6 (six) hours as needed for mild pain.    Marland Kitchen albuterol (PROVENTIL HFA;VENTOLIN HFA) 108 (90 Base) MCG/ACT inhaler Inhale 1 puff into the lungs every 6 (six) hours as needed for wheezing or shortness of breath.    Ailene Ards ELLIPTA 62.5-25 MCG/INH AEPB Inhale 1 puff into the lungs daily.  3  . aspirin EC 81 MG tablet Take 81 mg by mouth daily.    Marland Kitchen atorvastatin (LIPITOR) 20 MG tablet TAKE 1 TABLET BY MOUTH EVERY DAY 90 tablet 2  .  diclofenac sodium (VOLTAREN) 1 % GEL APPLY 2 INCHES TO THE AFFECTED JOINTS QID  6  . hydrALAZINE (APRESOLINE) 25 MG tablet TAKE 1 TABLET(25 MG) BY MOUTH DAILY 30 tablet 0  . hydrochlorothiazide (HYDRODIURIL) 25 MG tablet TAKE 1 TABLET BY MOUTH DAILY 90 tablet 2  . methimazole (TAPAZOLE) 5 MG tablet Take 5 mg by mouth daily.    . metoprolol succinate (TOPROL-XL) 50 MG 24 hr tablet TAKE 1 TABLET BY MOUTH DAILY WITH OR IMMEDIATELY FOLLOWING A MEAL 90 tablet 2  . nitroGLYCERIN (NITROSTAT) 0.4 MG SL tablet Place 1 tablet (0.4 mg total) under the tongue every 5 (five) minutes as needed for chest pain. 25 tablet 3   No current facility-administered medications for this visit.     Allergies  Allergen Reactions  . Ace Inhibitors Swelling    Angioedema of the  tongue  . Iodinated Diagnostic Agents Anaphylaxis  . Other Anaphylaxis    Allergic to melons, nuts  . Shellfish-Derived Products Anaphylaxis  . Hydrocodone Other (See Comments)    unspecified    Social History   Socioeconomic History  . Marital status: Married    Spouse name: Not on file  . Number of children: 3  . Years of education: Not on file  . Highest education level: Not on file  Occupational History  . Occupation: Retired  Engineer, production  . Financial resource strain: Not on file  . Food insecurity:    Worry: Not on file    Inability: Not on file  . Transportation needs:    Medical: Not on file    Non-medical: Not on file  Tobacco Use  . Smoking status: Former Smoker    Packs/day: 1.00    Years: 45.00    Pack years: 45.00    Types: Cigarettes    Last attempt to quit: 04/07/2010    Years since quitting: 8.5  . Smokeless tobacco: Never Used  Substance and Sexual Activity  . Alcohol use: Yes    Comment: rarely   . Drug use: No  . Sexual activity: Not on file  Lifestyle  . Physical activity:    Days per week: Not on file    Minutes per session: Not on file  . Stress: Not on file  Relationships  . Social connections:    Talks on phone: Not on file    Gets together: Not on file    Attends religious service: Not on file    Active member of club or organization: Not on file    Attends meetings of clubs or organizations: Not on file    Relationship status: Not on file  . Intimate partner violence:    Fear of current or ex partner: Not on file    Emotionally abused: Not on file    Physically abused: Not on file    Forced sexual activity: Not on file  Other Topics Concern  . Not on file  Social History Narrative  . Not on file    Family History  Problem Relation Age of Onset  . Peripheral vascular disease Mother   . Heart attack Father     Review of Systems:  As stated in the HPI and otherwise negative.   BP 120/70   Pulse 60   Ht 5' 1.5" (1.562 m)    Wt 114 lb (51.7 kg)   SpO2 99%   BMI 21.19 kg/m   Physical Examination:  General: Well developed, well nourished, NAD  HEENT: OP clear, mucus membranes moist  SKIN: warm, dry. No rashes. Neuro: No focal deficits  Musculoskeletal: Muscle strength 5/5 all ext  Psychiatric: Mood and affect normal  Neck: No JVD, no carotid bruits, no thyromegaly, no lymphadenopathy.  Lungs:Clear bilaterally, no wheezes, rhonci, crackles Cardiovascular: Regular rate and rhythm. No murmurs, gallops or rubs. Abdomen:Soft. Bowel sounds present. Non-tender.  Extremities: No lower extremity edema. Pulses are trace in the bilateral DP/PT.  EKG: sinus, rate 60 bpm. Diffuse T wave flattening.   Recent Labs: 02/17/2018: ALT 16; BUN 19; Creatinine, Ser 0.84; Hemoglobin 14.1; Platelets 292; Potassium 4.1; Sodium 137   Lipid Panel Followed in primary care.    Wt Readings from Last 3 Encounters:  10/13/18 114 lb (51.7 kg)  02/17/18 129 lb (58.5 kg)  02/12/18 129 lb (58.5 kg)     Other studies Reviewed: Additional studies/ records that were reviewed today include: . Review of the above records demonstrates:    Assessment and Plan:   1. PAD: No rest pain or lifestyle limiting claudication. Repeat ABI now.    2. CAD: No chest pain. Continue ASA, beta blocker and statin.     3. Hyperlipidemia: Last LDL 89 in March in primary care. She had repeat labs yesterday in primary care. Will try to get those labs. If her LDL is not at goal of 70 , will increase Lipitor to 40 mg daily.   Current medicines are reviewed at length with the patient today.  The patient does not have concerns regarding medicines.  The following changes have been made:  no change  Labs/ tests ordered today include:   No orders of the defined types were placed in this encounter.   Disposition:   FU with me in 12  months  Signed, Verne Carrow, MD 10/13/2018 9:30 AM    [facility] [address] [place],  [place], [place]  [address] Phone: [phone]; Fax: [phone]

## 2018-10-13 ENCOUNTER — Other Ambulatory Visit: Payer: Self-pay | Admitting: *Deleted

## 2018-10-13 ENCOUNTER — Encounter: Payer: Self-pay | Admitting: Cardiovascular Disease

## 2018-10-13 ENCOUNTER — Ambulatory Visit: Payer: Medicare Other | Admitting: Cardiovascular Disease

## 2018-10-13 VITALS — BP 120/70 | HR 60 | Ht 61.5 in | Wt 114.0 lb

## 2018-10-13 DIAGNOSIS — E7800 Pure hypercholesterolemia, unspecified: Secondary | ICD-10-CM | POA: Diagnosis not present

## 2018-10-13 DIAGNOSIS — I739 Peripheral vascular disease, unspecified: Secondary | ICD-10-CM

## 2018-10-13 DIAGNOSIS — I2510 Atherosclerotic heart disease of native coronary artery without angina pectoris: Secondary | ICD-10-CM | POA: Diagnosis not present

## 2018-10-13 MED ORDER — NITROGLYCERIN 0.4 MG SL SUBL: 0.4000 mg | SUBLINGUAL_TABLET | SUBLINGUAL | 3 refills | Status: DC | PRN

## 2018-10-13 NOTE — Addendum Note (Signed)
Addended by: Micki Riley C on: 10/13/2018 01:29 PM   Modules accepted: Orders

## 2018-10-13 NOTE — Patient Instructions (Signed)
Medication Instructions:  Your physician recommends that you continue on your current medications as directed. Please refer to the Current Medication list given to you today.  If you need a refill on your cardiac medications before your next appointment, please call your pharmacy.   Lab work: none If you have labs (blood work) drawn today and your tests are completely normal, you will receive your results only by: Marland Kitchen MyChart Message (if you have MyChart) OR . A paper copy in the mail If you have any lab test that is abnormal or we need to change your treatment, we will call you to review the results.  Testing/Procedures: Your physician has requested that you have an ankle brachial index (ABI). During this test an ultrasound and blood pressure cuff are used to evaluate the arteries that supply the arms and legs with blood. Allow thirty minutes for this exam. There are no restrictions or special instructions.    Follow-Up: At [facility], you and your health needs are our priority.  As part of our continuing mission to provide you with exceptional heart care, we have created designated Provider Care Teams.  These Care Teams include your primary Cardiologist (physician) and Advanced Practice Providers (APPs -  Physician Assistants and Nurse Practitioners) who all work together to provide you with the care you need, when you need it. You will need a follow up appointment in 12 months.  Please call our office 2 months in advance to schedule this appointment.  You may see Verne Carrow, MD or one of the following Advanced Practice Providers on your designated Care Team:   [place], PA-C Ronie Spies, PA-C . Jacolyn Reedy, PA-C  Any Other Special Instructions Will Be Listed Below (If Applicable).

## 2018-10-18 ENCOUNTER — Ambulatory Visit (HOSPITAL_COMMUNITY)
Admission: RE | Admit: 2018-10-18 | Payer: Medicare Other | Source: Ambulatory Visit | Attending: Cardiovascular Disease | Admitting: Cardiovascular Disease

## 2018-10-27 ENCOUNTER — Ambulatory Visit (HOSPITAL_COMMUNITY)
Admission: RE | Admit: 2018-10-27 | Discharge: 2018-10-27 | Disposition: A | Discharge status: Home / Self Care | Payer: Medicare Other | Source: Ambulatory Visit | Attending: Internal Medicine | Admitting: Internal Medicine

## 2018-10-27 ENCOUNTER — Ambulatory Visit: Payer: Medicare Other | Admitting: Podiatry

## 2018-10-27 ENCOUNTER — Encounter: Payer: Self-pay | Admitting: Podiatry

## 2018-10-27 DIAGNOSIS — B351 Tinea unguium: Secondary | ICD-10-CM

## 2018-10-27 DIAGNOSIS — Q828 Other specified congenital malformations of skin: Secondary | ICD-10-CM

## 2018-10-27 DIAGNOSIS — I739 Peripheral vascular disease, unspecified: Secondary | ICD-10-CM | POA: Diagnosis present

## 2018-10-27 DIAGNOSIS — M2042 Other hammer toe(s) (acquired), left foot: Secondary | ICD-10-CM

## 2018-10-27 NOTE — Progress Notes (Signed)
Patient ID: Kristine [name], female   DOB: 04/24/1941, 77 y.o.   MRN: [id number] Complaint:  Visit Type: Patient returns to my office for continued preventative foot care services. Complaint: Patient states" my nails have grown long and thick and become painful to walk and wear shoes"  She also has developed callus on left foot which hurts when walking.  She presents for preventative foot care services. No changes to ROS  Podiatric Exam: Vascular: dorsalis pedis and posterior tibial pulses are palpable bilateral. Capillary return is immediate. Temperature gradient is WNL. Skin turgor WNL  Sensorium: Normal Semmes Weinstein monofilament test. Normal tactile sensation bilaterally. Nail Exam: Pt has thick disfigured discolored nails with subungual debris noted bilateral entire nail hallux through fifth toenails Ulcer Exam: There is no evidence of ulcer or pre-ulcerative changes or infection. Orthopedic Exam: Muscle tone and strength are WNL. No limitations in general ROM. No crepitus or effusions noted. Foot type and digits show no abnormalities. Bony prominence unremarkable.. Skin:  Porokeratosis sub 3,4 left foot..  No infection or ulcers.  Heloma durum fifth toe left foot asymptomatic  Diagnosis:  Onychomycosis  B/L Pain in right toe, pain in left toes   Porokeratosis Left,  .  Treatment & Plan Procedures and Treatment: Consent by patient was obtained for treatment procedures. The patient understood the discussion of treatment and procedures well. All questions were answered thoroughly reviewed. Debridement of mycotic and hypertrophic toenails, 1 through 5 bilateral and clearing of subungual debris. No ulceration, no infection noted. .  Debridement of porokeratosis left foot. Debridement of HD 5th left. Return Visit-Office Procedure: Patient instructed to return to the office for a follow up visit 9 weeks for continued evaluation and treatment.  Cylee Dattilo DPM 

## 2018-10-29 ENCOUNTER — Telehealth: Payer: Self-pay | Admitting: *Deleted

## 2018-10-29 DIAGNOSIS — E7800 Pure hypercholesterolemia, unspecified: Secondary | ICD-10-CM

## 2018-10-29 MED ORDER — ATORVASTATIN CALCIUM 40 MG PO TABS: 40.0000 mg | ORAL_TABLET | Freq: Every day | ORAL | 3 refills | Status: DC

## 2018-10-29 NOTE — Telephone Encounter (Signed)
-----   Message from Kathleene Hazelhristopher D McAlhany, MD sent at 10/29/2018 12:44 PM EST ----- Labs from primary care with LDL not at goal of 70. I would increase her Lipitor to 40 mg daily and repeat lipids and LFTs in 12 weeks. cdm

## 2018-10-29 NOTE — Telephone Encounter (Signed)
I spoke with pt and reviewed lab results and recommendations from Dr. Clifton JamesMcAlhany with her. Will send prescription for new dose of Lipitor to [facility] [address].  She will come in for fasting lab work on 01/31/19

## 2018-11-08 ENCOUNTER — Other Ambulatory Visit: Payer: Self-pay | Admitting: Cardiovascular Disease

## 2018-12-08 ENCOUNTER — Other Ambulatory Visit: Payer: Self-pay | Admitting: Cardiovascular Disease

## 2018-12-29 ENCOUNTER — Ambulatory Visit: Payer: Medicare Other | Admitting: Podiatry

## 2019-01-19 ENCOUNTER — Ambulatory Visit: Payer: Medicare Other | Admitting: Podiatry

## 2019-01-19 ENCOUNTER — Encounter: Payer: Self-pay | Admitting: Podiatry

## 2019-01-19 DIAGNOSIS — B351 Tinea unguium: Secondary | ICD-10-CM

## 2019-01-19 DIAGNOSIS — Q828 Other specified congenital malformations of skin: Secondary | ICD-10-CM | POA: Diagnosis not present

## 2019-01-19 DIAGNOSIS — M2042 Other hammer toe(s) (acquired), left foot: Secondary | ICD-10-CM

## 2019-01-19 NOTE — Progress Notes (Signed)
Patient ID: Kristine [name], female   DOB: [birth year], 78 y.o.   MRN: [id number] Complaint:  Visit Type: Patient returns to my office for continued preventative foot care services. Complaint: Patient states" my nails have grown long and thick and become painful to walk and wear shoes"  She also has developed callus on left foot which hurts when walking.  She presents for preventative foot care services. No changes to ROS  Podiatric Exam: Vascular: dorsalis pedis and posterior tibial pulses are palpable bilateral. Capillary return is immediate. Temperature gradient is WNL. Skin turgor WNL  Sensorium: Normal Semmes Weinstein monofilament test. Normal tactile sensation bilaterally. Nail Exam: Pt has thick disfigured discolored nails with subungual debris noted bilateral entire nail hallux through fifth toenails Ulcer Exam: There is no evidence of ulcer or pre-ulcerative changes or infection. Orthopedic Exam: Muscle tone and strength are WNL. No limitations in general ROM. No crepitus or effusions noted. Foot type and digits show no abnormalities. Bony prominence unremarkable.. Skin:  Porokeratosis sub 3,4 left foot..  No infection or ulcers.  Heloma durum fifth toe left foot asymptomatic  Diagnosis:  Onychomycosis  B/L Pain in right toe, pain in left toes   Porokeratosis Left,  .  Treatment & Plan Procedures and Treatment: Consent by patient was obtained for treatment procedures. The patient understood the discussion of treatment and procedures well. All questions were answered thoroughly reviewed. Debridement of mycotic and hypertrophic toenails, 1 through 5 bilateral and clearing of subungual debris. No ulceration, no infection noted. .  Debridement of porokeratosis left foot. Debridement of HD 5th left. Return Visit-Office Procedure: Patient instructed to return to the office for a follow up visit 9 weeks for continued evaluation and treatment.  Helane Gunther DPM

## 2019-01-31 ENCOUNTER — Other Ambulatory Visit: Payer: Medicare Other

## 2019-01-31 DIAGNOSIS — E7800 Pure hypercholesterolemia, unspecified: Secondary | ICD-10-CM

## 2019-01-31 LAB — HEPATIC FUNCTION PANEL
ALT: 9 IU/L (ref 0–32)
AST: 20 IU/L (ref 0–40)
Albumin: 3.9 g/dL (ref 3.7–4.7)
Alkaline Phosphatase: 92 IU/L (ref 39–117)
BILIRUBIN TOTAL: 0.5 mg/dL (ref 0.0–1.2)
Bilirubin, Direct: 0.13 mg/dL (ref 0.00–0.40)
Total Protein: 7 g/dL (ref 6.0–8.5)

## 2019-01-31 LAB — LIPID PANEL
Chol/HDL Ratio: 3.3 ratio (ref 0.0–4.4)
Cholesterol, Total: 179 mg/dL (ref 100–199)
HDL: 54 mg/dL (ref >39)
LDL Calculated: 112 mg/dL — ABNORMAL HIGH (ref 0–99)
Triglycerides: 67 mg/dL (ref 0–149)
VLDL Cholesterol Cal: 13 mg/dL (ref 5–40)

## 2019-02-02 ENCOUNTER — Telehealth: Payer: Self-pay | Admitting: *Deleted

## 2019-02-02 DIAGNOSIS — E7800 Pure hypercholesterolemia, unspecified: Secondary | ICD-10-CM

## 2019-02-02 MED ORDER — ATORVASTATIN CALCIUM 80 MG PO TABS: 80.0000 mg | ORAL_TABLET | Freq: Every day | ORAL | 3 refills | Status: DC

## 2019-02-02 NOTE — Telephone Encounter (Signed)
-----   Message from Kathleene Hazel, MD sent at 01/31/2019  4:44 PM EST ----- LDL had been 140 in primary care in November. Lipitor was increased to 40 mg daily. LDL is now 112. Still not at goal. Recommend increasing lipitor to 80 mg daily and repeating lipids and LFTs in 12 weeks. LFT are ok today. cdm

## 2019-02-02 NOTE — Telephone Encounter (Signed)
Pt notified. She will increase Lipitor to 80 mg daily and come in for fasting lipid and liver profiles on May 27,2020. Will send new prescription to [facility] on [address].

## 2019-03-04 IMAGING — CR DG ABDOMEN ACUTE W/ 1V CHEST
3 series · 3 of 3 positions shown · non-contrast
Comparison: CT abdomen and pelvis February 12, 2018.

CLINICAL DATA: Abdominal pain

EXAM:
DG ABDOMEN ACUTE W/ 1V CHEST

[chest pa]
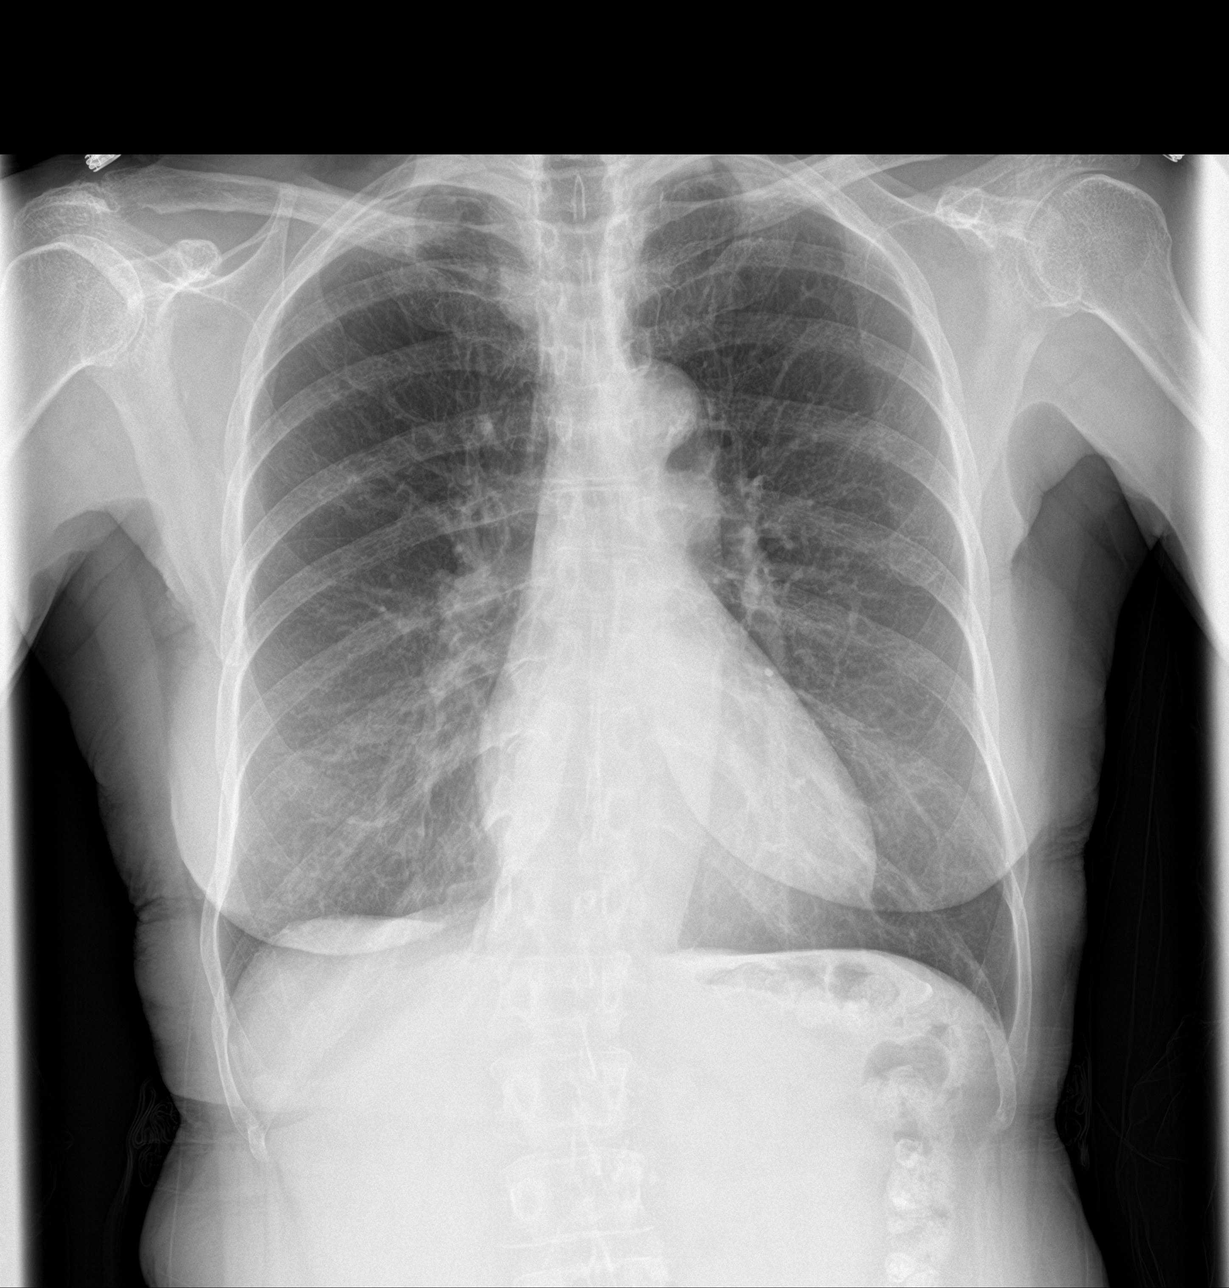

[abdomen erect]
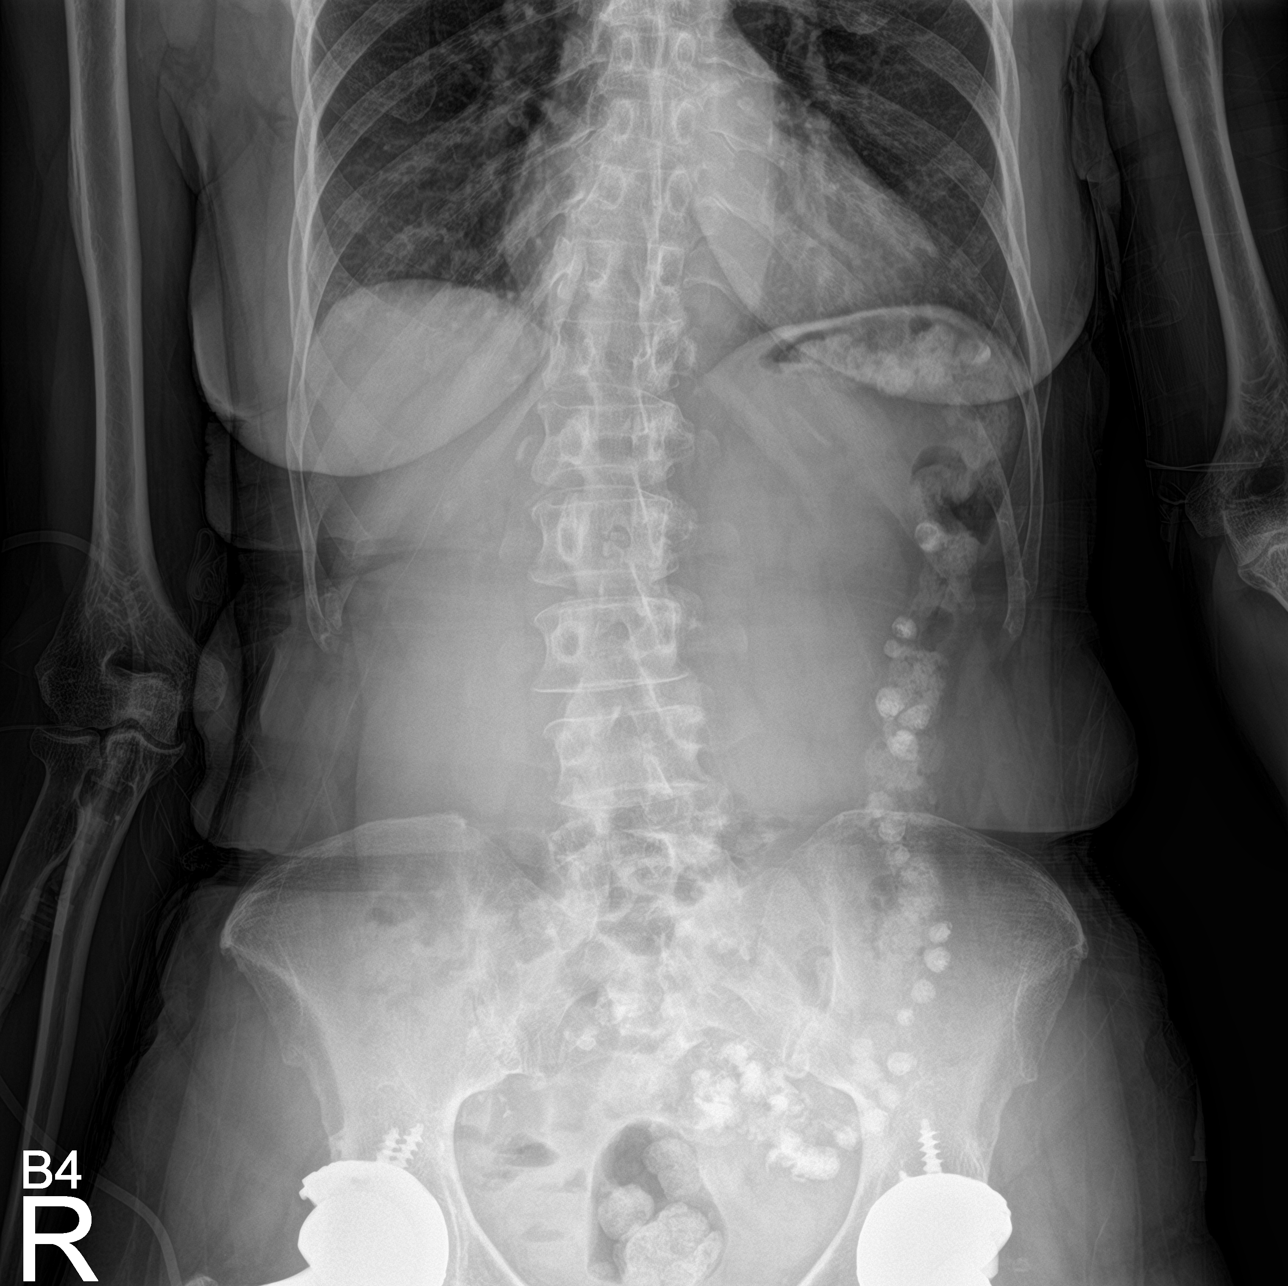

[abdomen supine]
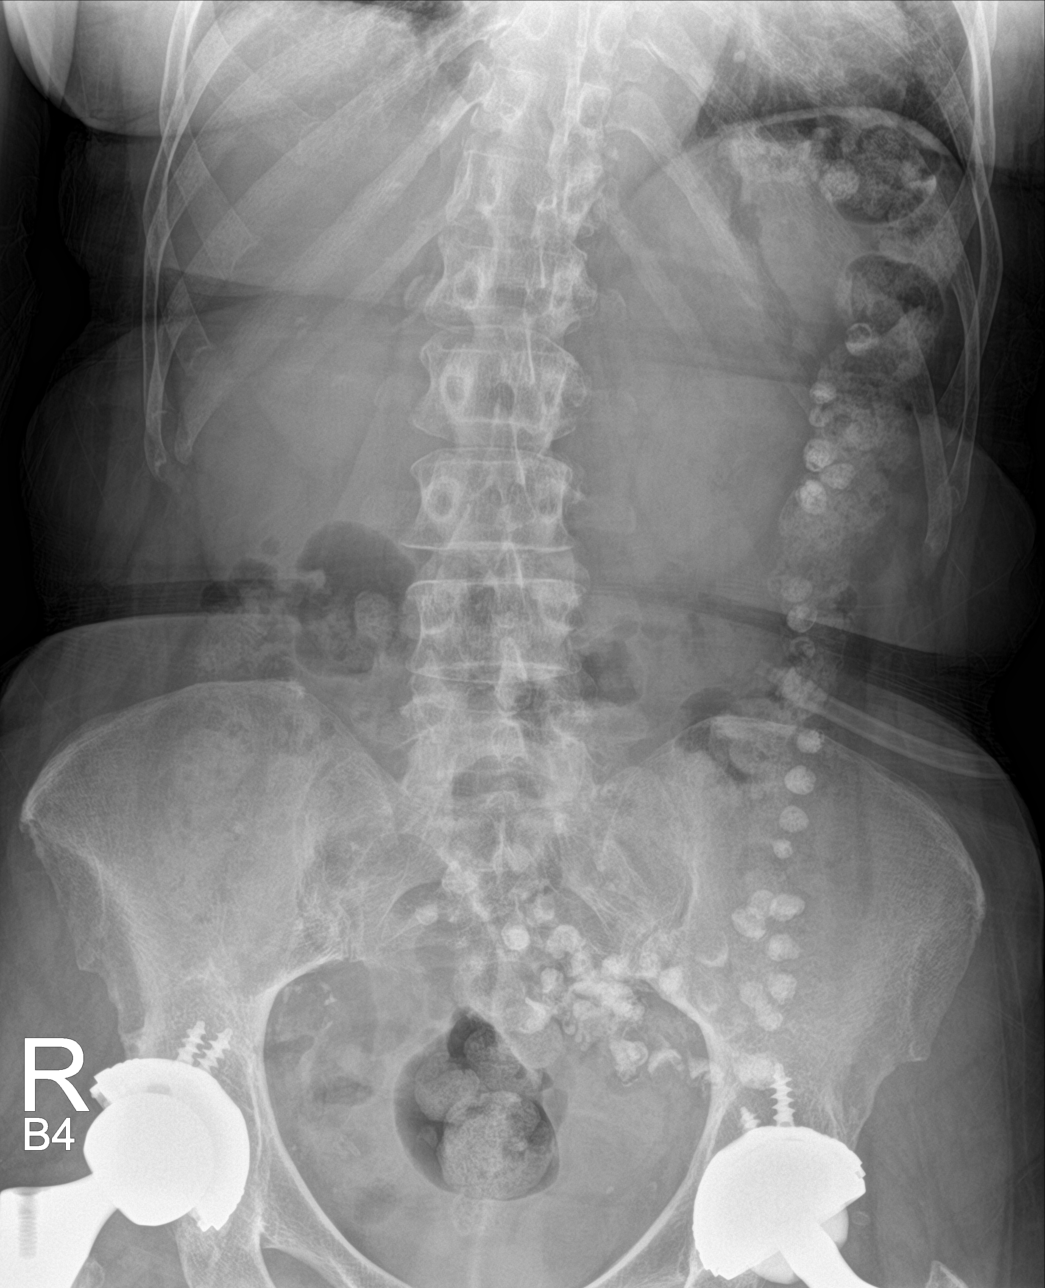

[3 of 3 positions shown; findings below may reference images not displayed]

FINDINGS: PA chest: No edema or consolidation. Heart size and pulmonary
vascularity are normal. No adenopathy. There is aortic
atherosclerosis.

Supine and upright abdomen: There are multiple contrast filled
diverticula throughout the left colon and sigmoid colon regions.
There is moderate stool in the colon. There is no bowel dilatation
or air-fluid level to suggest bowel obstruction. No free air. There
are phleboliths in the pelvis. There are total hip replacements
bilaterally.
IMPRESSION: Multiple left-sided colonic diverticula without inflammation seen.
Moderate stool in colon. No bowel obstruction or free air evident.

## 2019-03-23 ENCOUNTER — Ambulatory Visit: Payer: Medicare Other | Admitting: Podiatry

## 2019-03-23 ENCOUNTER — Other Ambulatory Visit: Payer: Self-pay

## 2019-03-23 ENCOUNTER — Encounter: Payer: Self-pay | Admitting: Podiatry

## 2019-03-23 VITALS — Temp 96.6°F

## 2019-03-23 DIAGNOSIS — B351 Tinea unguium: Secondary | ICD-10-CM

## 2019-03-23 DIAGNOSIS — Q828 Other specified congenital malformations of skin: Secondary | ICD-10-CM | POA: Diagnosis not present

## 2019-03-23 DIAGNOSIS — M79676 Pain in unspecified toe(s): Secondary | ICD-10-CM | POA: Diagnosis not present

## 2019-03-23 NOTE — Progress Notes (Signed)
Patient ID: Kristine [name], female   DOB: 10/10/1941, 77 y.o.   MRN: [id number] Complaint:  Visit Type: Patient returns to my office for continued preventative foot care services. Complaint: Patient states" my nails have grown long and thick and become painful to walk and wear shoes"  She also has developed callus on left foot which hurts when walking.  She presents for preventative foot care services. No changes to ROS  Podiatric Exam: Vascular: dorsalis pedis and posterior tibial pulses are palpable bilateral. Capillary return is immediate. Temperature gradient is WNL. Skin turgor WNL  Sensorium: Normal Semmes Weinstein monofilament test. Normal tactile sensation bilaterally. Nail Exam: Pt has thick disfigured discolored nails with subungual debris noted bilateral entire nail hallux through fifth toenails Ulcer Exam: There is no evidence of ulcer or pre-ulcerative changes or infection. Orthopedic Exam: Muscle tone and strength are WNL. No limitations in general ROM. No crepitus or effusions noted. Foot type and digits show no abnormalities. Bony prominence unremarkable.. Skin:  Porokeratosis sub 3,4 left foot..  No infection or ulcers.  Heloma durum fifth toe left foot asymptomatic  Diagnosis:  Onychomycosis  B/L Pain in right toe, pain in left toes   Porokeratosis Left,  .  Treatment & Plan Procedures and Treatment: Consent by patient was obtained for treatment procedures. The patient understood the discussion of treatment and procedures well. All questions were answered thoroughly reviewed. Debridement of mycotic and hypertrophic toenails, 1 through 5 bilateral and clearing of subungual debris. No ulceration, no infection noted. .  Debridement of porokeratosis left foot. Return Visit-Office Procedure: Patient instructed to return to the office for a follow up visit 9 weeks for continued evaluation and treatment.  Shalaina Guardiola DPM 

## 2019-03-25 ENCOUNTER — Ambulatory Visit: Payer: Medicare Other | Admitting: Podiatry

## 2019-05-04 ENCOUNTER — Other Ambulatory Visit: Payer: Medicare Other

## 2019-05-25 ENCOUNTER — Other Ambulatory Visit: Payer: Self-pay

## 2019-05-25 ENCOUNTER — Ambulatory Visit: Payer: Medicare Other | Admitting: Podiatry

## 2019-05-25 ENCOUNTER — Encounter: Payer: Self-pay | Admitting: Podiatry

## 2019-05-25 DIAGNOSIS — M79675 Pain in left toe(s): Secondary | ICD-10-CM

## 2019-05-25 DIAGNOSIS — B351 Tinea unguium: Secondary | ICD-10-CM | POA: Diagnosis not present

## 2019-05-25 DIAGNOSIS — M79674 Pain in right toe(s): Secondary | ICD-10-CM | POA: Diagnosis not present

## 2019-05-25 DIAGNOSIS — Q828 Other specified congenital malformations of skin: Secondary | ICD-10-CM

## 2019-05-25 NOTE — Progress Notes (Signed)
Patient ID: Kristine [name], female   DOB: [birth year], 78 y.o.   MRN: [id number] Complaint:  Visit Type: Patient returns to my office for continued preventative foot care services. Complaint: Patient states" my nails have grown long and thick and become painful to walk and wear shoes"  She also has developed callus on left foot which hurts when walking.  She presents for preventative foot care services. No changes to ROS  Podiatric Exam: Vascular: dorsalis pedis and posterior tibial pulses are palpable bilateral. Capillary return is immediate. Temperature gradient is WNL. Skin turgor WNL  Sensorium: Normal Semmes Weinstein monofilament test. Normal tactile sensation bilaterally. Nail Exam: Pt has thick disfigured discolored nails with subungual debris noted bilateral entire nail hallux through fifth toenails Ulcer Exam: There is no evidence of ulcer or pre-ulcerative changes or infection. Orthopedic Exam: Muscle tone and strength are WNL. No limitations in general ROM. No crepitus or effusions noted. Foot type and digits show no abnormalities. Bony prominence unremarkable.. Skin:  Porokeratosis sub 3,4 left foot..  No infection or ulcers.  Heloma durum fifth toe left foot asymptomatic  Diagnosis:  Onychomycosis  B/L Pain in right toe, pain in left toes   Porokeratosis Left,  .  Treatment & Plan Procedures and Treatment: Consent by patient was obtained for treatment procedures. The patient understood the discussion of treatment and procedures well. All questions were answered thoroughly reviewed. Debridement of mycotic and hypertrophic toenails, 1 through 5 bilateral and clearing of subungual debris. No ulceration, no infection noted. .  Debridement of porokeratosis left foot. Return Visit-Office Procedure: Patient instructed to return to the office for a follow up visit 9 weeks for continued evaluation and treatment.  Gardiner Barefoot DPM

## 2019-06-07 ENCOUNTER — Other Ambulatory Visit: Payer: Self-pay

## 2019-06-07 ENCOUNTER — Other Ambulatory Visit: Payer: Medicare Other

## 2019-06-07 DIAGNOSIS — E7800 Pure hypercholesterolemia, unspecified: Secondary | ICD-10-CM

## 2019-06-07 LAB — HEPATIC FUNCTION PANEL
ALT: 8 IU/L (ref 0–32)
AST: 22 IU/L (ref 0–40)
Albumin: 4.3 g/dL (ref 3.7–4.7)
Alkaline Phosphatase: 92 IU/L (ref 39–117)
Bilirubin Total: 0.6 mg/dL (ref 0.0–1.2)
Bilirubin, Direct: 0.13 mg/dL (ref 0.00–0.40)
Total Protein: 7.1 g/dL (ref 6.0–8.5)

## 2019-06-07 LAB — LIPID PANEL
Chol/HDL Ratio: 3.3 ratio (ref 0.0–4.4)
Cholesterol, Total: 193 mg/dL (ref 100–199)
HDL: 59 mg/dL (ref >39)
LDL Calculated: 119 mg/dL — ABNORMAL HIGH (ref 0–99)
Triglycerides: 76 mg/dL (ref 0–149)
VLDL Cholesterol Cal: 15 mg/dL (ref 5–40)

## 2019-06-08 ENCOUNTER — Telehealth: Payer: Self-pay | Admitting: Cardiovascular Disease

## 2019-06-08 DIAGNOSIS — E7800 Pure hypercholesterolemia, unspecified: Secondary | ICD-10-CM

## 2019-06-08 NOTE — Telephone Encounter (Signed)
New message:     Patient returning call back from yesterday concering lab results. Please call patient.

## 2019-06-08 NOTE — Telephone Encounter (Signed)
I spoke with pt and reviewed lab results with her.  She checked her medication bottles and she has both 40 mg and 80 mg Atorvastatin tablets.  The 80 mg bottles appear to be full so pt thinks she has been taking 40 mg.  She will increase to 80 mg daily and come in for fasting lab work on September 23,2020 at 7:30.

## 2019-08-03 ENCOUNTER — Encounter: Payer: Self-pay | Admitting: Podiatry

## 2019-08-03 ENCOUNTER — Other Ambulatory Visit: Payer: Self-pay

## 2019-08-03 ENCOUNTER — Ambulatory Visit: Payer: Medicare Other | Admitting: Podiatry

## 2019-08-03 DIAGNOSIS — M79674 Pain in right toe(s): Secondary | ICD-10-CM | POA: Diagnosis not present

## 2019-08-03 DIAGNOSIS — Q828 Other specified congenital malformations of skin: Secondary | ICD-10-CM

## 2019-08-03 DIAGNOSIS — M79675 Pain in left toe(s): Secondary | ICD-10-CM | POA: Diagnosis not present

## 2019-08-03 DIAGNOSIS — B351 Tinea unguium: Secondary | ICD-10-CM | POA: Diagnosis not present

## 2019-08-03 DIAGNOSIS — I739 Peripheral vascular disease, unspecified: Secondary | ICD-10-CM | POA: Diagnosis not present

## 2019-08-03 NOTE — Progress Notes (Signed)
Patient ID: Kristine [name], female   DOB: 1941-02-18, 78 y.o.   MRN: [id number] Complaint:  Visit Type: Patient returns to my office for continued preventative foot care services. Complaint: Patient states" my nails have grown long and thick and become painful to walk and wear shoes"  She also has developed callus on left foot which hurts when walking.  She presents for preventative foot care services. No changes to ROS  Podiatric Exam: Vascular: dorsalis pedis and posterior tibial pulses are palpable bilateral. Capillary return is immediate. Temperature gradient is WNL. Skin turgor WNL  Sensorium: Normal Semmes Weinstein monofilament test. Normal tactile sensation bilaterally. Nail Exam: Pt has thick disfigured discolored nails with subungual debris noted bilateral entire nail hallux through fifth toenails Ulcer Exam: There is no evidence of ulcer or pre-ulcerative changes or infection. Orthopedic Exam: Muscle tone and strength are WNL. No limitations in general ROM. No crepitus or effusions noted. Foot type and digits show no abnormalities. Bony prominence unremarkable.. Skin:  Porokeratosis sub 3,4 left foot..  No infection or ulcers.  Heloma durum fifth toe left foot asymptomatic  Diagnosis:  Onychomycosis  B/L Pain in right toe, pain in left toes   Porokeratosis Left,  .  Treatment & Plan Procedures and Treatment: Consent by patient was obtained for treatment procedures. The patient understood the discussion of treatment and procedures well. All questions were answered thoroughly reviewed. Debridement of mycotic and hypertrophic toenails, 1 through 5 bilateral and clearing of subungual debris. No ulceration, no infection noted. .  Debridement of porokeratosis left foot. Return Visit-Office Procedure: Patient instructed to return to the office for a follow up visit 9 weeks for continued evaluation and treatment.  Gardiner Barefoot DPM

## 2019-08-31 ENCOUNTER — Other Ambulatory Visit: Payer: Medicare Other | Admitting: *Deleted

## 2019-08-31 ENCOUNTER — Other Ambulatory Visit: Payer: Self-pay

## 2019-08-31 DIAGNOSIS — E7800 Pure hypercholesterolemia, unspecified: Secondary | ICD-10-CM

## 2019-08-31 LAB — LIPID PANEL
Chol/HDL Ratio: 2.6 ratio (ref 0.0–4.4)
Cholesterol, Total: 155 mg/dL (ref 100–199)
HDL: 59 mg/dL (ref >39)
LDL Chol Calc (NIH): 85 mg/dL (ref 0–99)
Triglycerides: 51 mg/dL (ref 0–149)
VLDL Cholesterol Cal: 11 mg/dL (ref 5–40)

## 2019-08-31 LAB — HEPATIC FUNCTION PANEL
ALT: 19 IU/L (ref 0–32)
AST: 35 IU/L (ref 0–40)
Albumin: 4.5 g/dL (ref 3.7–4.7)
Alkaline Phosphatase: 99 IU/L (ref 39–117)
Bilirubin Total: 0.9 mg/dL (ref 0.0–1.2)
Bilirubin, Direct: 0.22 mg/dL (ref 0.00–0.40)
Total Protein: 7.6 g/dL (ref 6.0–8.5)

## 2019-09-02 ENCOUNTER — Telehealth: Payer: Self-pay | Admitting: *Deleted

## 2019-09-02 MED ORDER — EZETIMIBE 10 MG PO TABS: 10.0000 mg | ORAL_TABLET | Freq: Every day | ORAL | 3 refills | Status: DC

## 2019-09-02 NOTE — Telephone Encounter (Signed)
I spoke with pt and reviewed lab work and recommendations from Dr. Angelena Form with her. Also discussed low fat diet with her.  She will start Zetia and have fasting lab work done when she sees Dr. Angelena Form for one year follow up on December 17,2020. She is aware to continue Atorvastatin.   Will send prescription to [facility] on [address].

## 2019-09-02 NOTE — Telephone Encounter (Signed)
-----   Message from Burnell Blanks, MD sent at 09/01/2019  7:40 AM EDT ----- LFTs are ok. Lipids still not at goal LDL of 70. She is at max dose of Lipitor. Can we see if she is willing to start Zetia 10 mg daily and then repeat lipids in 12 weeks? Thanks, chris

## 2019-09-06 ENCOUNTER — Other Ambulatory Visit: Payer: Self-pay | Admitting: Cardiovascular Disease

## 2019-09-06 MED ORDER — HYDROCHLOROTHIAZIDE 25 MG PO TABS: 25.0000 mg | ORAL_TABLET | Freq: Every day | ORAL | 0 refills | Status: DC

## 2019-09-06 MED ORDER — METOPROLOL SUCCINATE ER 50 MG PO TB24: ORAL_TABLET | ORAL | 0 refills | Status: DC

## 2019-09-14 ENCOUNTER — Emergency Department (HOSPITAL_COMMUNITY)
Admission: EM | Admit: 2019-09-14 | Discharge: 2019-09-14 | Disposition: A | Discharge status: Home / Self Care | Payer: Medicare Other | Attending: Emergency Medicine | Admitting: Emergency Medicine

## 2019-09-14 ENCOUNTER — Encounter (HOSPITAL_COMMUNITY): Payer: Self-pay | Admitting: Emergency Medicine

## 2019-09-14 ENCOUNTER — Other Ambulatory Visit: Payer: Self-pay

## 2019-09-14 DIAGNOSIS — Z7982 Long term (current) use of aspirin: Secondary | ICD-10-CM | POA: Insufficient documentation

## 2019-09-14 DIAGNOSIS — J449 Chronic obstructive pulmonary disease, unspecified: Secondary | ICD-10-CM | POA: Diagnosis not present

## 2019-09-14 DIAGNOSIS — R112 Nausea with vomiting, unspecified: Secondary | ICD-10-CM | POA: Insufficient documentation

## 2019-09-14 DIAGNOSIS — Z79899 Other long term (current) drug therapy: Secondary | ICD-10-CM | POA: Diagnosis not present

## 2019-09-14 DIAGNOSIS — R197 Diarrhea, unspecified: Secondary | ICD-10-CM | POA: Diagnosis not present

## 2019-09-14 DIAGNOSIS — I2510 Atherosclerotic heart disease of native coronary artery without angina pectoris: Secondary | ICD-10-CM | POA: Insufficient documentation

## 2019-09-14 DIAGNOSIS — Z87891 Personal history of nicotine dependence: Secondary | ICD-10-CM | POA: Insufficient documentation

## 2019-09-14 DIAGNOSIS — I10 Essential (primary) hypertension: Secondary | ICD-10-CM | POA: Diagnosis not present

## 2019-09-14 LAB — CBC
HCT: 43.3 % (ref 36.0–46.0)
Hemoglobin: 13.4 g/dL (ref 12.0–15.0)
MCH: 28.8 pg (ref 26.0–34.0)
MCHC: 30.9 g/dL (ref 30.0–36.0)
MCV: 92.9 fL (ref 80.0–100.0)
Platelets: 225 10*3/uL (ref 150–400)
RBC: 4.66 MIL/uL (ref 3.87–5.11)
RDW: 16.1 % — ABNORMAL HIGH (ref 11.5–15.5)
WBC: 7.4 10*3/uL (ref 4.0–10.5)
nRBC: 0 % (ref 0.0–0.2)

## 2019-09-14 LAB — COMPREHENSIVE METABOLIC PANEL
ALT: 24 U/L (ref 0–44)
AST: 37 U/L (ref 15–41)
Albumin: 4 g/dL (ref 3.5–5.0)
Alkaline Phosphatase: 74 U/L (ref 38–126)
Anion gap: 13 (ref 5–15)
BUN: 18 mg/dL (ref 8–23)
CO2: 24 mmol/L (ref 22–32)
Calcium: 9.5 mg/dL (ref 8.9–10.3)
Chloride: 103 mmol/L (ref 98–111)
Creatinine, Ser: 0.74 mg/dL (ref 0.44–1.00)
GFR calc Af Amer: >60 mL/min (ref >60)
GFR calc non Af Amer: >60 mL/min (ref >60)
Glucose, Bld: 88 mg/dL (ref 70–99)
Potassium: 3.6 mmol/L (ref 3.5–5.1)
Sodium: 140 mmol/L (ref 135–145)
Total Bilirubin: 1.1 mg/dL (ref 0.3–1.2)
Total Protein: 7.3 g/dL (ref 6.5–8.1)

## 2019-09-14 LAB — TROPONIN I (HIGH SENSITIVITY)
Troponin I (High Sensitivity): 2 ng/L (ref <18)
Troponin I (High Sensitivity): 3 ng/L (ref <18)

## 2019-09-14 LAB — LIPASE, BLOOD: Lipase: 32 U/L (ref 11–51)

## 2019-09-14 MED ORDER — SODIUM CHLORIDE 0.9 % IV BOLUS
1000.0000 mL | Freq: Once | INTRAVENOUS | Status: AC
  Administered 2019-09-14: 1000 mL via INTRAVENOUS

## 2019-09-14 MED ORDER — ONDANSETRON 4 MG PO TBDP: ORAL_TABLET | ORAL | 0 refills | Status: DC

## 2019-09-14 MED ORDER — SODIUM CHLORIDE 0.9% FLUSH: 3.0000 mL | Freq: Once | INTRAVENOUS | Status: DC

## 2019-09-14 MED ORDER — ONDANSETRON HCL 4 MG/2ML IJ SOLN
4.0000 mg | Freq: Once | INTRAMUSCULAR | Status: AC
  Administered 2019-09-14: 4 mg via INTRAVENOUS
  Filled 2019-09-14: qty 2

## 2019-09-14 NOTE — ED Notes (Signed)
Pt tolerated PO challenge

## 2019-09-14 NOTE — ED Triage Notes (Signed)
Pt reports this morning she had her flu shot and after she did started having abd pains, n/v/d and body aches.

## 2019-09-14 NOTE — Discharge Instructions (Signed)
This may have been allergic reaction to the flu vaccine.  Please follow-up with your family doctor.  Mention this to them next year when this time for your vaccine.

## 2019-09-14 NOTE — ED Provider Notes (Signed)
[facility]-EMERGENCY DEPT Provider Note   CSN: [id number] Arrival date & time: 09/14/19  1639     History   Chief Complaint Chief Complaint  Patient presents with  . Emesis  . Abdominal Pain  . Diarrhea  . Generalized Body Aches    HPI Kristine [name] is a 78 y.o. female.     78 yo F with a chief complaint of nausea vomiting and diarrhea.  This occurred shortly after getting her influenza vaccine.  She had that today about 10 AM and then around 2 PM started having symptoms.  She also describes some shortness of breath during that time.  Improved with an inhaler and after multiple bouts of vomiting and diarrhea the patient started having some abdominal and chest pain.  All this is much better as though the patient's husband called their doctor's office and they told him that they should come to the ED to be evaluated.  Prior to this she denies any illness.  Has never had an influenza vaccine before.  The history is provided by the patient.  Emesis Associated symptoms: abdominal pain and diarrhea   Associated symptoms: no arthralgias, no chills, no fever, no headaches and no myalgias   Abdominal Pain Associated symptoms: chest pain, diarrhea, shortness of breath and vomiting   Associated symptoms: no chills, no dysuria, no fever and no nausea   Diarrhea Associated symptoms: abdominal pain and vomiting   Associated symptoms: no arthralgias, no chills, no fever, no headaches and no myalgias   Illness Severity:  Moderate Onset quality:  Gradual Duration:  7 hours Timing:  Constant Progression:  Resolved Chronicity:  New Associated symptoms: abdominal pain, chest pain, diarrhea, shortness of breath and vomiting   Associated symptoms: no congestion, no fever, no headaches, no myalgias, no nausea, no rhinorrhea and no wheezing     Past Medical History:  Diagnosis Date  . COPD (chronic obstructive pulmonary disease) (HCC)   . Hard of hearing   . History  of anemia   . History of blood transfusion   . History of pneumonia   . HTN (hypertension)   . Hyperlipidemia   . Hyperthyroidism    treated wtih radioactive iodine  . OA (osteoarthritis of spine)   . PVD (peripheral vascular disease) (HCC)    Lower extremity Dopplers  May 2revealed an ABI of 0.83 in the right posterior   . Shortness of breath dyspnea   . STEMI (ST elevation myocardial infarction) [facility](HCC) May 2011   Promus DES in the proximal, mid and distal RCA in May 2011  . Stroke (HCC)   . Urinary frequency   . Urinary urgency     Patient Active Problem List   Diagnosis Date Noted  . Pain due to onychomycosis of toenails of both feet 05/25/2019  . Porokeratosis 05/25/2019  . 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 Angioedema 05/31/2015  . Fever 03/11/2015  . Acute blood loss anemia 03/11/2015  . Melena 03/10/2015  . GI bleed 03/09/2015  . Peripheral arterial disease (HCC) 03/01/2012  . CAD (coronary artery disease) 01/20/2012  . HTN (hypertension)   . Hyperlipidemia   . PVD (peripheral vascular disease) (HCC)   . COPD (chronic obstructive pulmonary disease) (HCC)   . Hyperthyroidism   . OA (osteoarthritis of spine)   . STEMI (ST elevation myocardial infarction) (HCC) 04/07/2010    Past Surgical History:  Procedure Laterality Date  . ABDOMINAL HYSTERECTOMY  Had ovarian resection and required lysis of adhesions  . CARDIAC CATHETERIZATION  04/07/2010   EF 60-70%  . CORONARY STENTS     x3  . ESOPHAGOGASTRODUODENOSCOPY N/A 03/10/2015   Procedure: ESOPHAGOGASTRODUODENOSCOPY (EGD);  Surgeon: Wandalee Ferdinand, MD;  Location: [facility] ENDOSCOPY;  Service: Endoscopy;  Laterality: N/A;  . OVARY SURGERY    . TOTAL HIP ARTHROPLASTY  2004   left  . TOTAL HIP ARTHROPLASTY Right 01/23/2016   Procedure: RIGHT TOTAL HIP ARTHROPLASTY ANTERIOR APPROACH;  Surgeon: Loreta Ave, MD;  Location: [facility] OR;  Service: Orthopedics;   Laterality: Right;     OB History   No obstetric history on file.      Home Medications    Prior to Admission medications   Medication Sig Start Date End Date Taking? Authorizing Provider  acetaminophen (TYLENOL) 325 MG tablet Take 650 mg by mouth every 6 (six) hours as needed for mild pain.   Yes [provider]  albuterol (VENTOLIN HFA) 108 (90 Base) MCG/ACT inhaler Inhale 1-2 puffs into the lungs every 4 (four) hours as needed for wheezing.  04/13/19  Yes [provider]  ANORO ELLIPTA 62.5-25 MCG/INH AEPB Inhale 1 puff into the lungs daily. 09/25/17  Yes [provider]  aspirin EC 81 MG tablet Take 81 mg by mouth daily.   Yes [provider]  atorvastatin (LIPITOR) 80 MG tablet Take 1 tablet (80 mg total) by mouth daily. 02/02/19  Yes Kathleene Hazel, MD  diclofenac sodium (VOLTAREN) 1 % GEL Apply 2 g topically 4 (four) times daily.  06/29/18  Yes [provider]  ezetimibe (ZETIA) 10 MG tablet Take 1 tablet (10 mg total) by mouth daily. 09/02/19  Yes Kathleene Hazel, MD  hydrALAZINE (APRESOLINE) 25 MG tablet TAKE 1 TABLET(25 MG) BY MOUTH DAILY Patient taking differently: Take 25 mg by mouth daily.  11/08/18  Yes Kathleene Hazel, MD  hydrochlorothiazide (HYDRODIURIL) 25 MG tablet Take 1 tablet (25 mg total) by mouth daily. please keep upcoming appt in December with Dr. Clifton James for future refills. Thank you 09/06/19  Yes Kathleene Hazel, MD  methimazole (TAPAZOLE) 5 MG tablet Take 5 mg by mouth daily.   Yes [provider]  metoprolol succinate (TOPROL-XL) 50 MG 24 hr tablet TAKE 1 TABLET BY MOUTH DAILY WITH OR IMMEDIATELY FOLLOWING A MEAL please keep upcoming appt in December with Dr. Clifton James for future refills. Thank you Patient taking differently: Take 50 mg by mouth daily. TAKE 1 TABLET BY MOUTH DAILY WITH OR IMMEDIATELY FOLLOWING A MEAL 09/06/19  Yes Kathleene Hazel, MD  nitroGLYCERIN  (NITROSTAT) 0.4 MG SL tablet Place 1 tablet (0.4 mg total) under the tongue every 5 (five) minutes as needed for chest pain. 10/13/18   Kathleene Hazel, MD  ondansetron (ZOFRAN ODT) 4 MG disintegrating tablet  ODT q4 hours prn nausea/vomit 09/14/19   Melene Plan, DO    Family History Family History  Problem Relation Age of Onset  . Peripheral vascular disease Mother   . Heart attack Father     Social History Social History   Tobacco Use  . Smoking status: Former Smoker    Packs/day: 1.00    Years: 45.00    Pack years: 45.00    Types: Cigarettes    Quit date: 04/07/2010    Years since quitting: 9.4  . Smokeless tobacco: Never Used  Substance Use Topics  . Alcohol use: Yes    Comment: rarely   . Drug use:  No     Allergies   Ace inhibitors, Iodinated diagnostic agents, Other, Shellfish-derived products, and Hydrocodone   Review of Systems Review of Systems  Constitutional: Negative for chills and fever.  HENT: Negative for congestion and rhinorrhea.   Eyes: Negative for redness and visual disturbance.  Respiratory: Positive for shortness of breath. Negative for wheezing.   Cardiovascular: Positive for chest pain. Negative for palpitations.  Gastrointestinal: Positive for abdominal pain, diarrhea and vomiting. Negative for nausea.  Genitourinary: Negative for dysuria and urgency.  Musculoskeletal: Negative for arthralgias and myalgias.  Skin: Negative for pallor and wound.  Neurological: Negative for dizziness and headaches.     Physical Exam Updated Vital Signs BP 109/65   Pulse 92   Temp 98 F (36.7 C) (Oral)   Resp 17   SpO2 97%   Physical Exam Vitals signs and nursing note reviewed.  Constitutional:      General: She is not in acute distress.    Appearance: She is well-developed. She is not diaphoretic.  HENT:     Head: Normocephalic and atraumatic.  Eyes:     Pupils: Pupils are equal, round, and reactive to light.  Neck:     Musculoskeletal:  Normal range of motion and neck supple.  Cardiovascular:     Rate and Rhythm: Normal rate and regular rhythm.     Heart sounds: No murmur. No friction rub. No gallop.   Pulmonary:     Effort: Pulmonary effort is normal.     Breath sounds: No wheezing or rales.  Abdominal:     General: There is no distension.     Palpations: Abdomen is soft.     Tenderness: There is no abdominal tenderness.  Musculoskeletal:        General: No tenderness.  Skin:    General: Skin is warm and dry.  Neurological:     Mental Status: She is alert and oriented to person, place, and time.  Psychiatric:        Behavior: Behavior normal.      ED Treatments / Results  Labs (all labs ordered are listed, but only abnormal results are displayed) Labs Reviewed  CBC - Abnormal; Notable for the following components:      Result Value   RDW 16.1 (*)    All other components within normal limits  LIPASE, BLOOD  COMPREHENSIVE METABOLIC PANEL  URINALYSIS, ROUTINE W REFLEX MICROSCOPIC  TROPONIN I (HIGH SENSITIVITY)  TROPONIN I (HIGH SENSITIVITY)    EKG None  Radiology No results found.  Procedures Procedures (including critical care time)  Medications Ordered in ED Medications  sodium chloride 0.9 % bolus 1,000 mL (0 mLs Intravenous Stopped 09/14/19 2036)  ondansetron (ZOFRAN) injection 4 mg (4 mg Intravenous Given 09/14/19 1900)     Initial Impression / Assessment and Plan / ED Course  I have reviewed the triage vital signs and the nursing notes.  Pertinent labs & imaging results that were available during my care of the patient were reviewed by me and considered in my medical decision making (see chart for details).        78 yo F with a chief complaint of nausea vomiting diarrhea and shortness of breath shortly after getting her influenza vaccine.  This could have been anaphylaxis based on her history.  She is significantly improved since then.  Will give a bolus of IV fluids Zofran and oral  trial.  Her EKG that was obtained in front shows questionable ST depression in the inferior  leads.  This has been seen on her EKGs prior though it is more pronounced today.  There is no ST elevation.  We will send off a single troponin.  Troponin is completely negative.  Patient is continued to feel better and able to tolerate p.o.  Will discharge home.  8:56 PM:  I have discussed the diagnosis/risks/treatment options with the patient and family and believe the pt to be eligible for discharge home to follow-up with PCP. We also discussed returning to the ED immediately if new or worsening sx occur. We discussed the sx which are most concerning (e.g., sudden worsening pain, fever, inability to tolerate by mouth) that necessitate immediate return. Medications administered to the patient during their visit and any new prescriptions provided to the patient are listed below.  Medications given during this visit Medications  sodium chloride 0.9 % bolus 1,000 mL (0 mLs Intravenous Stopped 09/14/19 2036)  ondansetron (ZOFRAN) injection 4 mg (4 mg Intravenous Given 09/14/19 1900)     The patient appears reasonably screen and/or stabilized for discharge and I doubt any other medical condition or other [facility] requiring further screening, evaluation, or treatment in the ED at this time prior to discharge.      Final Clinical Impressions(s) / ED Diagnoses   Final diagnoses:  Nausea vomiting and diarrhea    ED Discharge Orders         Ordered    ondansetron (ZOFRAN ODT) 4 MG disintegrating tablet     09/14/19 2053           Deno Etienne, DO 09/14/19 2056

## 2019-09-14 NOTE — ED Notes (Signed)
Pt and husband provided with drink, crackers and peanut butter.

## 2019-10-04 ENCOUNTER — Other Ambulatory Visit: Payer: Self-pay | Admitting: Cardiovascular Disease

## 2019-10-04 MED ORDER — HYDRALAZINE HCL 25 MG PO TABS: ORAL_TABLET | ORAL | 2 refills | Status: DC

## 2019-10-12 ENCOUNTER — Ambulatory Visit: Payer: Medicare Other | Admitting: Podiatry

## 2019-10-12 ENCOUNTER — Other Ambulatory Visit: Payer: Self-pay

## 2019-10-12 ENCOUNTER — Encounter: Payer: Self-pay | Admitting: Podiatry

## 2019-10-12 DIAGNOSIS — Q828 Other specified congenital malformations of skin: Secondary | ICD-10-CM | POA: Diagnosis not present

## 2019-10-12 DIAGNOSIS — M79674 Pain in right toe(s): Secondary | ICD-10-CM | POA: Diagnosis not present

## 2019-10-12 DIAGNOSIS — I739 Peripheral vascular disease, unspecified: Secondary | ICD-10-CM | POA: Diagnosis not present

## 2019-10-12 DIAGNOSIS — B351 Tinea unguium: Secondary | ICD-10-CM | POA: Diagnosis not present

## 2019-10-12 DIAGNOSIS — M79675 Pain in left toe(s): Secondary | ICD-10-CM

## 2019-10-12 MED ORDER — AMMONIUM LACTATE 12 % EX CREA: TOPICAL_CREAM | CUTANEOUS | 0 refills | Status: DC | PRN

## 2019-10-12 NOTE — Progress Notes (Signed)
Patient ID: Kristine [name], female   DOB: [birth year], 78 y.o.   MRN: [id number] Complaint:  Visit Type: Patient returns to my office for continued preventative foot care services. Complaint: Patient states" my nails have grown long and thick and become painful to walk and wear shoes"  She also has developed callus on left foot which hurts when walking.  She presents for preventative foot care services. No changes to ROS  Podiatric Exam: Vascular: dorsalis pedis and posterior tibial pulses are palpable bilateral. Capillary return is immediate. Temperature gradient is WNL. Skin turgor WNL  Sensorium: Normal Semmes Weinstein monofilament test. Normal tactile sensation bilaterally. Nail Exam: Pt has thick disfigured discolored nails with subungual debris noted bilateral entire nail hallux through fifth toenails Ulcer Exam: There is no evidence of ulcer or pre-ulcerative changes or infection. Orthopedic Exam: Muscle tone and strength are WNL. No limitations in general ROM. No crepitus or effusions noted. Foot type and digits show no abnormalities. Bony prominence unremarkable.. Skin:  Porokeratosis sub 3,4 left foot..  No infection or ulcers.  Heloma durum fifth toe left foot asymptomatic.  Dry peeling skin.  Diagnosis:  Onychomycosis  B/L Pain in right toe, pain in left toes   Porokeratosis Left,  .  Treatment & Plan Procedures and Treatment: Consent by patient was obtained for treatment procedures. The patient understood the discussion of treatment and procedures well. All questions were answered thoroughly reviewed. Debridement of mycotic and hypertrophic toenails, 1 through 5 bilateral and clearing of subungual debris. No ulceration, no infection noted. .  Debridement of porokeratosis left foot. Padding applied to forefoot left shoe.  Prescribe lac-hydrin. Return Visit-Office Procedure: Patient instructed to return to the office for a follow up visit 9 weeks for continued evaluation and  treatment.  Gardiner Barefoot DPM

## 2019-11-24 ENCOUNTER — Ambulatory Visit: Payer: Medicare Other | Admitting: Cardiovascular Disease

## 2019-11-24 NOTE — Progress Notes (Signed)
Chief Complaint  Patient presents with  . Follow-up    CAD    History of Present Illness: 78 yo female with history of CAD, HTN, HLD, COPD, prior TIA and PAD who is here today for cardiac follow up. She had an inferior STEMI in 2011 treated with 3 drug eluting stents in the proximal, mid and distal RCA. She is known to have anomalous takeoff of all coronaries. Nuclear stress test in October 2014 with no ischemia. She has PAD with abnormal LE arterial dopplers since May 2011. She was seen in 2011 by Dr. Hart Rochester but has had serial LE arterial testing in our office since. Last ABI  November 2019 with stable abnormal ABI. She was seen in our [facility] clinic by Dr. Allyson Sabal December 2018 and he felt that her toe pain was not vascular in nature. He planned follow up as needed in the [facility] clinic.   She is here today for follow up. The patient denies any dyspnea, palpitations, lower extremity edema, orthopnea, PND, dizziness, near syncope or syncope. She has no leg pain with walking. No rest pain. Rare sharp chest pains that last a few seconds.   Primary Care Physician: Renaye Rakers, MD  Past Medical History:  Diagnosis Date  . COPD (chronic obstructive pulmonary disease) (HCC)   . Hard of hearing   . History of anemia   . History of blood transfusion   . History of pneumonia   . HTN (hypertension)   . Hyperlipidemia   . Hyperthyroidism    treated wtih radioactive iodine  . OA (osteoarthritis of spine)   . PVD (peripheral vascular disease) (HCC)    Lower extremity Dopplers  May 2revealed an ABI of 0.83 in the right posterior   . Shortness of breath dyspnea   . STEMI (ST elevation myocardial infarction) [facility]) May 2011   Promus DES in the proximal, mid and distal RCA in May 2011  . Stroke (HCC)   . Urinary frequency   . Urinary urgency     Past Surgical History:  Procedure Laterality Date  . ABDOMINAL HYSTERECTOMY     Had ovarian resection and required lysis of adhesions  . CARDIAC CATHETERIZATION   04/07/2010   EF 60-70%  . CORONARY STENTS     x3  . ESOPHAGOGASTRODUODENOSCOPY N/A 03/10/2015   Procedure: ESOPHAGOGASTRODUODENOSCOPY (EGD);  Surgeon: Wandalee Ferdinand, MD;  Location: [facility] ENDOSCOPY;  Service: Endoscopy;  Laterality: N/A;  . OVARY SURGERY    . TOTAL HIP ARTHROPLASTY  2004   left  . TOTAL HIP ARTHROPLASTY Right 01/23/2016   Procedure: RIGHT TOTAL HIP ARTHROPLASTY ANTERIOR APPROACH;  Surgeon: Loreta Ave, MD;  Location: [facility] OR;  Service: Orthopedics;  Laterality: Right;    Current Outpatient Medications  Medication Sig Dispense Refill  . acetaminophen (TYLENOL) 325 MG tablet Take 650 mg by mouth every 6 (six) hours as needed for mild pain.    Marland Kitchen albuterol (VENTOLIN HFA) 108 (90 Base) MCG/ACT inhaler Inhale 1-2 puffs into the lungs every 4 (four) hours as needed for wheezing.     Marland Kitchen ammonium lactate (LAC-HYDRIN) 12 % cream Apply topically as needed for dry skin. 385 g 0  . ANORO ELLIPTA 62.5-25 MCG/INH AEPB Inhale 1 puff into the lungs daily.  3  . aspirin EC 81 MG tablet Take 81 mg by mouth daily.    Marland Kitchen atorvastatin (LIPITOR) 80 MG tablet Take 1 tablet (80 mg total) by mouth daily. 90 tablet 3  . diclofenac sodium (VOLTAREN) 1 %  GEL Apply 2 g topically 4 (four) times daily.   6  . ezetimibe (ZETIA) 10 MG tablet Take 1 tablet (10 mg total) by mouth daily. 90 tablet 3  . hydrALAZINE (APRESOLINE) 25 MG tablet TAKE 1 TABLET(25 MG) BY MOUTH DAILY. Please keep upcoming appt in December with Dr. Angelena Form. Thank you 30 tablet 2  . hydrochlorothiazide (HYDRODIURIL) 25 MG tablet Take 1 tablet (25 mg total) by mouth daily. please keep upcoming appt in December with Dr. Angelena Form for future refills. Thank you 90 tablet 0  . methimazole (TAPAZOLE) 5 MG tablet Take 5 mg by mouth daily.    . metoprolol succinate (TOPROL-XL) 50 MG 24 hr tablet Take 50 mg by mouth daily. Take with or immediately following a meal.    . nitroGLYCERIN (NITROSTAT) 0.4 MG SL tablet Place 1 tablet (0.4 mg total) under the  tongue every 5 (five) minutes as needed for chest pain. 25 tablet 3   No current facility-administered medications for this visit.    Allergies  Allergen Reactions  . Ace Inhibitors Swelling    Angioedema of the tongue  . Iodinated Diagnostic Agents Anaphylaxis  . Other Anaphylaxis    Allergic to melons, nuts  . Shellfish-Derived Products Anaphylaxis  . Hydrocodone Other (See Comments)    unspecified    Social History   Socioeconomic History  . Marital status: Married    Spouse name: Not on file  . Number of children: 3  . Years of education: Not on file  . Highest education level: Not on file  Occupational History  . Occupation: Retired  Tobacco Use  . Smoking status: Former Smoker    Packs/day: 1.00    Years: 45.00    Pack years: 45.00    Types: Cigarettes    Quit date: 04/07/2010    Years since quitting: 9.6  . Smokeless tobacco: Never Used  Substance and Sexual Activity  . Alcohol use: Yes    Comment: rarely   . Drug use: No  . Sexual activity: Not on file  Other Topics Concern  . Not on file  Social History Narrative  . Not on file   Social Determinants of Health   Financial Resource Strain:   . Difficulty of Paying Living Expenses: Not on file  Food Insecurity:   . Worried About Charity fundraiser in the Last Year: Not on file  . Ran Out of Food in the Last Year: Not on file  Transportation Needs:   . Lack of Transportation (Medical): Not on file  . Lack of Transportation (Non-Medical): Not on file  Physical Activity:   . Days of Exercise per Week: Not on file  . Minutes of Exercise per Session: Not on file  Stress:   . Feeling of Stress : Not on file  Social Connections:   . Frequency of Communication with Friends and Family: Not on file  . Frequency of Social Gatherings with Friends and Family: Not on file  . Attends Religious Services: Not on file  . Active Member of Clubs or Organizations: Not on file  . Attends Archivist Meetings:  Not on file  . Marital Status: Not on file  Intimate Partner Violence:   . Fear of Current or Ex-Partner: Not on file  . Emotionally Abused: Not on file  . Physically Abused: Not on file  . Sexually Abused: Not on file    Family History  Problem Relation Age of Onset  . Peripheral vascular disease Mother   .  Heart attack Father     Review of Systems:  As stated in the HPI and otherwise negative.   BP 132/88   Pulse 75   Ht 5' 1.5" (1.562 m)   Wt 97 lb 12.8 oz (44.4 kg)   SpO2 99%   BMI 18.18 kg/m   Physical Examination:  General: Well developed, well nourished, NAD  HEENT: OP clear, mucus membranes moist  SKIN: warm, dry. No rashes. Neuro: No focal deficits  Musculoskeletal: Muscle strength 5/5 all ext  Psychiatric: Mood and affect normal  Neck: No JVD, no carotid bruits, no thyromegaly, no lymphadenopathy.  Lungs:Clear bilaterally, no wheezes, rhonci, crackles Cardiovascular: Regular rate and rhythm. No murmurs, gallops or rubs. Abdomen:Soft. Bowel sounds present. Non-tender.  Extremities: No lower extremity edema. Pulses are trace in the bilateral DP/PT.  EKG: No EKG today  Recent Labs: 09/14/2019: ALT 24; BUN 18; Creatinine, Ser 0.74; Hemoglobin 13.4; Platelets 225; Potassium 3.6; Sodium 140   Lipid Panel Lipid Panel     Component Value Date/Time   CHOL 155 08/31/2019 0000   TRIG 51 08/31/2019 0000   HDL 59 08/31/2019 0000   CHOLHDL 2.6 08/31/2019 0000   CHOLHDL 3 07/31/2014 1153   VLDL 20.6 07/31/2014 1153   LDLCALC 85 08/31/2019 0000   LDLDIRECT 138.9 07/23/2011 0815   LABVLDL 11 08/31/2019 0000     Wt Readings from Last 3 Encounters:  11/25/19 97 lb 12.8 oz (44.4 kg)  10/13/18 114 lb (51.7 kg)  02/17/18 129 lb (58.5 kg)     Other studies Reviewed: Additional studies/ records that were reviewed today include: . Review of the above records demonstrates:    Assessment and Plan:   1. PAD: No ulcerations or change in chronic claudication. No  rest pain. Repeat ABI now.   2. CAD without angina: No chest pains that sound cardiac. Continue ASA, statin and beta blocker.   3. Hyperlipidemia: LDL near goal on recent labs. Continue statin and Zetia.   Current medicines are reviewed at length with the patient today.  The patient does not have concerns regarding medicines.  The following changes have been made:  no change  Labs/ tests ordered today include:   Orders Placed This Encounter  Procedures  . VAS US ABI WITH/WO TBI    Disposition:   FU with me in 12  months  Signed, Verne Carrowhristopher , MD 11/25/2019 9:37 AM    [facility] [address], [place], [place]  [address] Phone: [phone]) [phone]; Fax: [phone]

## 2019-11-25 ENCOUNTER — Ambulatory Visit: Payer: Medicare Other | Admitting: Cardiovascular Disease

## 2019-11-25 ENCOUNTER — Other Ambulatory Visit: Payer: Self-pay

## 2019-11-25 ENCOUNTER — Encounter: Payer: Self-pay | Admitting: Cardiovascular Disease

## 2019-11-25 VITALS — BP 132/88 | HR 75 | Ht 61.5 in | Wt 97.8 lb

## 2019-11-25 DIAGNOSIS — I739 Peripheral vascular disease, unspecified: Secondary | ICD-10-CM | POA: Diagnosis not present

## 2019-11-25 DIAGNOSIS — E7800 Pure hypercholesterolemia, unspecified: Secondary | ICD-10-CM | POA: Diagnosis not present

## 2019-11-25 DIAGNOSIS — I2510 Atherosclerotic heart disease of native coronary artery without angina pectoris: Secondary | ICD-10-CM

## 2019-11-25 MED ORDER — NITROGLYCERIN 0.4 MG SL SUBL: 0.4000 mg | SUBLINGUAL_TABLET | SUBLINGUAL | 3 refills | Status: DC | PRN

## 2019-11-25 NOTE — Patient Instructions (Signed)
Medication Instructions:  No changes *If you need a refill on your cardiac medications before your next appointment, please call your pharmacy*  Lab Work: none If you have labs (blood work) drawn today and your tests are completely normal, you will receive your results only by: Marland Kitchen MyChart Message (if you have MyChart) OR . A paper copy in the mail If you have any lab test that is abnormal or we need to change your treatment, we will call you to review the results.  Testing/Procedures: Your physician has requested that you have an ankle brachial index (ABI). During this test an ultrasound and blood pressure cuff are used to evaluate the arteries that supply the arms and legs with blood. Allow thirty minutes for this exam. There are no restrictions or special instructions.   Follow-Up: At [facility], you and your health needs are our priority.  As part of our continuing mission to provide you with exceptional heart care, we have created designated Provider Care Teams.  These Care Teams include your primary Cardiologist (physician) and Advanced Practice Providers (APPs -  Physician Assistants and Nurse Practitioners) who all work together to provide you with the care you need, when you need it.  Your next appointment:   12 month(s)  The format for your next appointment:   Either In Person or Virtual  Provider:   You may see Lauree Chandler, MD or one of the following Advanced Practice Providers on your designated Care Team:    Melina Copa, PA-C  Ermalinda Barrios, PA-C   Other Instructions

## 2019-12-05 ENCOUNTER — Ambulatory Visit (HOSPITAL_COMMUNITY)
Admission: RE | Admit: 2019-12-05 | Discharge: 2019-12-05 | Disposition: A | Discharge status: Home / Self Care | Payer: Medicare Other | Source: Ambulatory Visit | Attending: Cardiovascular Disease | Admitting: Cardiovascular Disease

## 2019-12-05 ENCOUNTER — Other Ambulatory Visit: Payer: Self-pay

## 2019-12-05 ENCOUNTER — Other Ambulatory Visit (HOSPITAL_COMMUNITY): Payer: Self-pay | Admitting: Cardiovascular Disease

## 2019-12-05 DIAGNOSIS — I2510 Atherosclerotic heart disease of native coronary artery without angina pectoris: Secondary | ICD-10-CM

## 2019-12-05 DIAGNOSIS — E7800 Pure hypercholesterolemia, unspecified: Secondary | ICD-10-CM | POA: Diagnosis not present

## 2019-12-05 DIAGNOSIS — I739 Peripheral vascular disease, unspecified: Secondary | ICD-10-CM | POA: Insufficient documentation

## 2019-12-05 MED ORDER — METOPROLOL SUCCINATE ER 50 MG PO TB24: 50.0000 mg | ORAL_TABLET | Freq: Every day | ORAL | 3 refills | Status: DC

## 2019-12-05 MED ORDER — HYDROCHLOROTHIAZIDE 25 MG PO TABS: 25.0000 mg | ORAL_TABLET | Freq: Every day | ORAL | 3 refills | Status: DC

## 2019-12-21 ENCOUNTER — Other Ambulatory Visit: Payer: Self-pay

## 2019-12-21 ENCOUNTER — Encounter: Payer: Self-pay | Admitting: Podiatry

## 2019-12-21 ENCOUNTER — Ambulatory Visit (INDEPENDENT_AMBULATORY_CARE_PROVIDER_SITE_OTHER): Payer: Medicare PPO | Admitting: Podiatry

## 2019-12-21 DIAGNOSIS — M79674 Pain in right toe(s): Secondary | ICD-10-CM | POA: Diagnosis not present

## 2019-12-21 DIAGNOSIS — B351 Tinea unguium: Secondary | ICD-10-CM | POA: Diagnosis not present

## 2019-12-21 DIAGNOSIS — M79675 Pain in left toe(s): Secondary | ICD-10-CM | POA: Diagnosis not present

## 2019-12-21 DIAGNOSIS — Q828 Other specified congenital malformations of skin: Secondary | ICD-10-CM

## 2019-12-21 DIAGNOSIS — I739 Peripheral vascular disease, unspecified: Secondary | ICD-10-CM

## 2019-12-21 NOTE — Progress Notes (Signed)
Patient ID: Kristine [name], female   DOB: [birth year], 79 y.o.   MRN: [id number] Complaint:  Visit Type: Patient returns to my office for continued preventative foot care services. Complaint: Patient states" my nails have grown long and thick and become painful to walk and wear shoes"  She also has developed callus on left foot which hurts when walking.  She presents for preventative foot care services. No changes to ROS  Podiatric Exam: Vascular: dorsalis pedis and posterior tibial pulses are palpable bilateral. Capillary return is immediate. Temperature gradient is WNL. Skin turgor WNL  Sensorium: Normal Semmes Weinstein monofilament test. Normal tactile sensation bilaterally. Nail Exam: Pt has thick disfigured discolored nails with subungual debris noted bilateral entire nail hallux through fifth toenails Ulcer Exam: There is no evidence of ulcer or pre-ulcerative changes or infection. Orthopedic Exam: Muscle tone and strength are WNL. No limitations in general ROM. No crepitus or effusions noted. Foot type and digits show no abnormalities. Bony prominence unremarkable.. Skin:  Porokeratosis sub 3,4 left foot..  No infection or ulcers.  Heloma durum fifth toe left foot asymptomatic.  Dry peeling blackened skin on dorsum of both feet.  No pain. skin.  Diagnosis:  Onychomycosis  B/L Pain in right toe, pain in left toes   Porokeratosis Left,  .  Treatment & Plan Procedures and Treatment: Consent by patient was obtained for treatment procedures. The patient understood the discussion of treatment and procedures well. All questions were answered thoroughly reviewed. Debridement of mycotic and hypertrophic toenails, 1 through 5 bilateral and clearing of subungual debris. No ulceration, no infection noted. .  Debridement of porokeratosis left foot.  Prescribe lac-hydrin. Return Visit-Office Procedure: Patient instructed to return to the office for a follow up visit 9 weeks for continued evaluation and  treatment.  Helane Gunther DPM

## 2020-03-02 ENCOUNTER — Other Ambulatory Visit: Payer: Self-pay

## 2020-03-02 MED ORDER — ATORVASTATIN CALCIUM 80 MG PO TABS: 80.0000 mg | ORAL_TABLET | Freq: Every day | ORAL | 2 refills | Status: DC

## 2020-03-12 DIAGNOSIS — I10 Essential (primary) hypertension: Secondary | ICD-10-CM | POA: Diagnosis not present

## 2020-03-12 DIAGNOSIS — M130 Polyarthritis, unspecified: Secondary | ICD-10-CM | POA: Diagnosis not present

## 2020-03-12 DIAGNOSIS — R634 Abnormal weight loss: Secondary | ICD-10-CM | POA: Diagnosis not present

## 2020-03-12 DIAGNOSIS — E782 Mixed hyperlipidemia: Secondary | ICD-10-CM | POA: Diagnosis not present

## 2020-03-12 DIAGNOSIS — H908 Mixed conductive and sensorineural hearing loss, unspecified: Secondary | ICD-10-CM | POA: Diagnosis not present

## 2020-03-21 ENCOUNTER — Ambulatory Visit: Payer: Medicare PPO | Admitting: Podiatry

## 2020-03-21 ENCOUNTER — Other Ambulatory Visit: Payer: Self-pay

## 2020-03-21 ENCOUNTER — Encounter: Payer: Self-pay | Admitting: Podiatry

## 2020-03-21 VITALS — Temp 96.3°F

## 2020-03-21 DIAGNOSIS — Q828 Other specified congenital malformations of skin: Secondary | ICD-10-CM

## 2020-03-21 DIAGNOSIS — I739 Peripheral vascular disease, unspecified: Secondary | ICD-10-CM | POA: Diagnosis not present

## 2020-03-21 DIAGNOSIS — B351 Tinea unguium: Secondary | ICD-10-CM

## 2020-03-21 DIAGNOSIS — M79675 Pain in left toe(s): Secondary | ICD-10-CM

## 2020-03-21 DIAGNOSIS — M79674 Pain in right toe(s): Secondary | ICD-10-CM

## 2020-03-21 NOTE — Progress Notes (Signed)
This patient returns to my office for at risk foot care.  This patient requires this care by a professional since this patient will be at risk due to having PAD, PVD.  Patient has painful callus on left forefoot.  This patient is unable to cut nails herself since the patient cannot reach her nails.These nails are painful walking and wearing shoes.  This patient presents for at risk foot care today.  General Appearance  Alert, conversant and in no acute stress.  Vascular  Dorsalis pedis and posterior tibial  pulses are weakly  palpable  bilaterally.  Capillary return is within normal limits  bilaterally. Temperature is within normal limits  bilaterally.  Neurologic  Senn-Weinstein monofilament wire test within normal limits  bilaterally. Muscle power within normal limits bilaterally.  Nails Thick disfigured discolored nails with subungual debris  from hallux to fifth toes bilaterally. No evidence of bacterial infection or drainage bilaterally.  Orthopedic  No limitations of motion  feet .  No crepitus or effusions noted.  No bony pathology or digital deformities noted.  Skin  normotropic skin noted bilaterally.  No signs of infections or ulcers noted.   Porokeratosis sub 3 left foot.  Dry peeling black skin right dorsum forefoot.  Onychomycosis  Pain in right toes  Pain in left toes  ZDebridement of porokeratosis with # 15 blade.  Consent was obtained for treatment procedures.   Mechanical debridement of nails 1-5  bilaterally performed with a nail nipper.  Filed with dremel without incident.    Return office visit    9 weeks                  Told patient to return for periodic foot care and evaluation due to potential at risk complications.   Helane Gunther DPM

## 2020-03-26 DIAGNOSIS — E782 Mixed hyperlipidemia: Secondary | ICD-10-CM | POA: Diagnosis not present

## 2020-03-26 DIAGNOSIS — G464 Cerebellar stroke syndrome: Secondary | ICD-10-CM | POA: Diagnosis not present

## 2020-03-26 DIAGNOSIS — I639 Cerebral infarction, unspecified: Secondary | ICD-10-CM | POA: Diagnosis not present

## 2020-03-26 DIAGNOSIS — I10 Essential (primary) hypertension: Secondary | ICD-10-CM | POA: Diagnosis not present

## 2020-05-30 ENCOUNTER — Ambulatory Visit: Payer: Medicare PPO | Admitting: Podiatry

## 2020-05-30 ENCOUNTER — Encounter: Payer: Self-pay | Admitting: Podiatry

## 2020-05-30 ENCOUNTER — Other Ambulatory Visit: Payer: Self-pay

## 2020-05-30 DIAGNOSIS — M79674 Pain in right toe(s): Secondary | ICD-10-CM | POA: Diagnosis not present

## 2020-05-30 DIAGNOSIS — M79675 Pain in left toe(s): Secondary | ICD-10-CM

## 2020-05-30 DIAGNOSIS — I739 Peripheral vascular disease, unspecified: Secondary | ICD-10-CM

## 2020-05-30 DIAGNOSIS — Q828 Other specified congenital malformations of skin: Secondary | ICD-10-CM

## 2020-05-30 DIAGNOSIS — B351 Tinea unguium: Secondary | ICD-10-CM | POA: Diagnosis not present

## 2020-05-30 NOTE — Progress Notes (Signed)
This patient returns to my office for at risk foot care.  This patient requires this care by a professional since this patient will be at risk due to having PAD, PVD.  Patient has painful callus on left forefoot.  This patient is unable to cut nails herself since the patient cannot reach her nails.These nails are painful walking and wearing shoes.  This patient presents for at risk foot care today.  General Appearance  Alert, conversant and in no acute stress.  Vascular  Dorsalis pedis and posterior tibial  pulses are weakly  palpable  bilaterally.  Capillary return is within normal limits  bilaterally. Temperature is within normal limits  bilaterally.  Neurologic  Senn-Weinstein monofilament wire test within normal limits  bilaterally. Muscle power within normal limits bilaterally.  Nails Thick disfigured discolored nails with subungual debris  from hallux to fifth toes bilaterally. No evidence of bacterial infection or drainage bilaterally.  Orthopedic  No limitations of motion  feet .  No crepitus or effusions noted.  No bony pathology or digital deformities noted.  Skin  normotropic skin noted bilaterally.  No signs of infections or ulcers noted.   Porokeratosis sub 3 left foot.  Dry peeling black skin right dorsum forefoot.  Onychomycosis  Pain in right toes  Pain in left toes  ZDebridement of porokeratosis with # 15 blade.  Consent was obtained for treatment procedures.   Mechanical debridement of nails 1-5  bilaterally performed with a nail nipper.  Filed with dremel without incident.    Return office visit    prn                  Told patient to return for periodic foot care and evaluation due to potential at risk complications.   Helane Gunther DPM

## 2020-06-22 DIAGNOSIS — F518 Other sleep disorders not due to a substance or known physiological condition: Secondary | ICD-10-CM | POA: Diagnosis not present

## 2020-06-22 DIAGNOSIS — H908 Mixed conductive and sensorineural hearing loss, unspecified: Secondary | ICD-10-CM | POA: Diagnosis not present

## 2020-06-22 DIAGNOSIS — R7309 Other abnormal glucose: Secondary | ICD-10-CM | POA: Diagnosis not present

## 2020-06-22 DIAGNOSIS — E782 Mixed hyperlipidemia: Secondary | ICD-10-CM | POA: Diagnosis not present

## 2020-06-22 DIAGNOSIS — G464 Cerebellar stroke syndrome: Secondary | ICD-10-CM | POA: Diagnosis not present

## 2020-06-22 DIAGNOSIS — I10 Essential (primary) hypertension: Secondary | ICD-10-CM | POA: Diagnosis not present

## 2020-06-22 DIAGNOSIS — E079 Disorder of thyroid, unspecified: Secondary | ICD-10-CM | POA: Diagnosis not present

## 2020-06-22 DIAGNOSIS — I639 Cerebral infarction, unspecified: Secondary | ICD-10-CM | POA: Diagnosis not present

## 2020-06-22 DIAGNOSIS — F0151 Vascular dementia with behavioral disturbance: Secondary | ICD-10-CM | POA: Diagnosis not present

## 2020-06-22 DIAGNOSIS — I739 Peripheral vascular disease, unspecified: Secondary | ICD-10-CM | POA: Diagnosis not present

## 2020-06-23 ENCOUNTER — Encounter (HOSPITAL_COMMUNITY): Payer: Self-pay | Admitting: *Deleted

## 2020-06-23 ENCOUNTER — Emergency Department (HOSPITAL_COMMUNITY)
Admission: EM | Admit: 2020-06-23 | Discharge: 2020-06-24 | Disposition: A | Discharge status: Home / Self Care | Payer: Medicare PPO | Attending: Emergency Medicine | Admitting: Emergency Medicine

## 2020-06-23 ENCOUNTER — Emergency Department (HOSPITAL_COMMUNITY): Payer: Medicare PPO

## 2020-06-23 ENCOUNTER — Other Ambulatory Visit: Payer: Self-pay

## 2020-06-23 DIAGNOSIS — F0390 Unspecified dementia without behavioral disturbance: Secondary | ICD-10-CM | POA: Insufficient documentation

## 2020-06-23 DIAGNOSIS — Z8673 Personal history of transient ischemic attack (TIA), and cerebral infarction without residual deficits: Secondary | ICD-10-CM | POA: Diagnosis not present

## 2020-06-23 DIAGNOSIS — R0789 Other chest pain: Secondary | ICD-10-CM | POA: Diagnosis not present

## 2020-06-23 DIAGNOSIS — Z951 Presence of aortocoronary bypass graft: Secondary | ICD-10-CM | POA: Diagnosis not present

## 2020-06-23 DIAGNOSIS — Z7982 Long term (current) use of aspirin: Secondary | ICD-10-CM | POA: Insufficient documentation

## 2020-06-23 DIAGNOSIS — Z79899 Other long term (current) drug therapy: Secondary | ICD-10-CM | POA: Insufficient documentation

## 2020-06-23 DIAGNOSIS — I10 Essential (primary) hypertension: Secondary | ICD-10-CM | POA: Diagnosis not present

## 2020-06-23 DIAGNOSIS — J449 Chronic obstructive pulmonary disease, unspecified: Secondary | ICD-10-CM | POA: Insufficient documentation

## 2020-06-23 DIAGNOSIS — R000 Tachycardia, unspecified: Secondary | ICD-10-CM | POA: Diagnosis not present

## 2020-06-23 DIAGNOSIS — I2510 Atherosclerotic heart disease of native coronary artery without angina pectoris: Secondary | ICD-10-CM | POA: Diagnosis not present

## 2020-06-23 DIAGNOSIS — R002 Palpitations: Secondary | ICD-10-CM | POA: Diagnosis not present

## 2020-06-23 DIAGNOSIS — Z87891 Personal history of nicotine dependence: Secondary | ICD-10-CM | POA: Diagnosis not present

## 2020-06-23 DIAGNOSIS — Z96643 Presence of artificial hip joint, bilateral: Secondary | ICD-10-CM | POA: Insufficient documentation

## 2020-06-23 DIAGNOSIS — R079 Chest pain, unspecified: Secondary | ICD-10-CM | POA: Diagnosis not present

## 2020-06-23 LAB — CBC
HCT: 41.9 % (ref 36.0–46.0)
Hemoglobin: 13.3 g/dL (ref 12.0–15.0)
MCH: 29.2 pg (ref 26.0–34.0)
MCHC: 31.7 g/dL (ref 30.0–36.0)
MCV: 92.1 fL (ref 80.0–100.0)
Platelets: 367 10*3/uL (ref 150–400)
RBC: 4.55 MIL/uL (ref 3.87–5.11)
RDW: 16.2 % — ABNORMAL HIGH (ref 11.5–15.5)
WBC: 5.7 10*3/uL (ref 4.0–10.5)
nRBC: 0 % (ref 0.0–0.2)

## 2020-06-23 LAB — BASIC METABOLIC PANEL
Anion gap: 10 (ref 5–15)
BUN: 17 mg/dL (ref 8–23)
CO2: 22 mmol/L (ref 22–32)
Calcium: 9.4 mg/dL (ref 8.9–10.3)
Chloride: 106 mmol/L (ref 98–111)
Creatinine, Ser: 0.72 mg/dL (ref 0.44–1.00)
GFR calc Af Amer: >60 mL/min (ref >60)
GFR calc non Af Amer: >60 mL/min (ref >60)
Glucose, Bld: 136 mg/dL — ABNORMAL HIGH (ref 70–99)
Potassium: 3.7 mmol/L (ref 3.5–5.1)
Sodium: 138 mmol/L (ref 135–145)

## 2020-06-23 LAB — TROPONIN I (HIGH SENSITIVITY): Troponin I (High Sensitivity): 4 ng/L (ref <18)

## 2020-06-23 MED ORDER — SODIUM CHLORIDE 0.9% FLUSH: 3.0000 mL | Freq: Once | INTRAVENOUS | Status: DC

## 2020-06-23 NOTE — ED Triage Notes (Signed)
The pt is c/o a rapid heart rate all day with a strange feeling at the base of her lt neck intermittently  That feeling  No longer apparent

## 2020-06-24 LAB — TROPONIN I (HIGH SENSITIVITY): Troponin I (High Sensitivity): 4 ng/L (ref <18)

## 2020-06-24 NOTE — Discharge Instructions (Signed)
Return if you are having any problems. 

## 2020-06-24 NOTE — ED Provider Notes (Signed)
[facility] EMERGENCY DEPARTMENT Provider Note   CSN: [id number] Arrival date & time: 06/23/20  2055   History Chief Complaint  Patient presents with  . Chest Pain    Kristine [name] is a 79 y.o. female.  The history is provided by the patient and the spouse. The history is limited by the condition of the patient (Dementia).  Chest Pain She has history of hypertension, hyperlipidemia, coronary artery disease, peripheral vascular disease, COPD and comes in because of a vague chest discomfort.  Patient is an extremely poor historian, and her husband was relying something that she had said at that time.  She describes a funny feeling in the left supraclavicular area and she had relayed that she could feel her heartbeat going fast there, but did not feel it in her chest.  She cannot describe the funny feeling in any way.  There is no associated dyspnea, nausea, diaphoresis.  She states that she was able to go about her normal tasks in spite of the discomfort.  She is currently symptom-free.  Past Medical History:  Diagnosis Date  . COPD (chronic obstructive pulmonary disease) (HCC)   . Hard of hearing   . History of anemia   . History of blood transfusion   . History of pneumonia   . HTN (hypertension)   . Hyperlipidemia   . Hyperthyroidism    treated wtih radioactive iodine  . OA (osteoarthritis of spine)   . PVD (peripheral vascular disease) (HCC)    Lower extremity Dopplers  May 2revealed an ABI of 0.83 in the right posterior   . Shortness of breath dyspnea   . STEMI (ST elevation myocardial infarction) [facility]) May 2011   Promus DES in the proximal, mid and distal RCA in May 2011  . Stroke (HCC)   . Urinary frequency   . Urinary urgency     Patient Active Problem List   Diagnosis Date Noted  . Pain due to onychomycosis of toenails of both feet 05/25/2019  . Porokeratosis 05/25/2019  . 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 ACE  inhibitor-aggravated angioedema 05/31/2015  . Angioedema 05/31/2015  . Fever 03/11/2015  . Acute blood loss anemia 03/11/2015  . Melena 03/10/2015  . GI bleed 03/09/2015  . Peripheral arterial disease (HCC) 03/01/2012  . CAD (coronary artery disease) 01/20/2012  . HTN (hypertension)   . Hyperlipidemia   . PVD (peripheral vascular disease) (HCC)   . COPD (chronic obstructive pulmonary disease) (HCC)   . Hyperthyroidism   . OA (osteoarthritis of spine)   . STEMI (ST elevation myocardial infarction) (HCC) 04/07/2010    Past Surgical History:  Procedure Laterality Date  . ABDOMINAL HYSTERECTOMY     Had ovarian resection and required lysis of adhesions  . CARDIAC CATHETERIZATION  04/07/2010   EF 60-70%  . CORONARY STENTS     x3  . ESOPHAGOGASTRODUODENOSCOPY N/A 03/10/2015   Procedure: ESOPHAGOGASTRODUODENOSCOPY (EGD);  Surgeon: Wandalee Ferdinand, MD;  [facility];  Service: Endoscopy;  Laterality: N/A;  . OVARY SURGERY    . TOTAL HIP ARTHROPLASTY  2004   left  . TOTAL HIP ARTHROPLASTY Right 01/23/2016   Procedure: RIGHT TOTAL HIP ARTHROPLASTY ANTERIOR APPROACH;  Surgeon: Loreta Ave, MD;  Location: [facility] OR;  Service: Orthopedics;  Laterality: Right;     OB History   No obstetric history on file.     Family History  Problem Relation Age of Onset  . Peripheral vascular disease Mother   .  Heart attack Father     Social History   Tobacco Use  . Smoking status: Former Smoker    Packs/day: 1.00    Years: 45.00    Pack years: 45.00    Types: Cigarettes    Quit date: 04/07/2010    Years since quitting: 10.2  . Smokeless tobacco: Never Used  Vaping Use  . Vaping Use: Never used  Substance Use Topics  . Alcohol use: Yes    Comment: rarely   . Drug use: No    Home Medications Prior to Admission medications   Medication Sig Start Date End Date Taking? Authorizing Provider  acetaminophen (TYLENOL) 325 MG tablet Take 650 mg by mouth every 6 (six) hours as needed for mild  pain.    [provider]  albuterol (VENTOLIN HFA) 108 (90 Base) MCG/ACT inhaler Inhale 1-2 puffs into the lungs every 4 (four) hours as needed for wheezing.  04/13/19   [provider]  ammonium lactate (LAC-HYDRIN) 12 % cream Apply topically as needed for dry skin. 10/12/19   Helane Gunther, DPM  ANORO ELLIPTA 62.5-25 MCG/INH AEPB Inhale 1 puff into the lungs daily. 09/25/17   [provider]  aspirin EC 81 MG tablet Take 81 mg by mouth daily.    [provider]  atorvastatin (LIPITOR) 80 MG tablet Take 1 tablet (80 mg total) by mouth daily. 03/02/20   Kathleene Hazel, MD  diclofenac sodium (VOLTAREN) 1 % GEL Apply 2 g topically 4 (four) times daily.  06/29/18   [provider]  ezetimibe (ZETIA) 10 MG tablet Take 1 tablet (10 mg total) by mouth daily. 09/02/19   Kathleene Hazel, MD  hydrALAZINE (APRESOLINE) 25 MG tablet TAKE 1 TABLET(25 MG) BY MOUTH DAILY. Please keep upcoming appt in December with Dr. Clifton James. Thank you 10/04/19   Kathleene Hazel, MD  hydrochlorothiazide (HYDRODIURIL) 25 MG tablet Take 1 tablet (25 mg total) by mouth daily. 12/05/19   Kathleene Hazel, MD  megestrol (MEGACE ES) 625 MG/5ML suspension  03/30/20   [provider]  methimazole (TAPAZOLE) 5 MG tablet Take 5 mg by mouth daily.    [provider]  metoprolol succinate (TOPROL-XL) 50 MG 24 hr tablet Take 1 tablet (50 mg total) by mouth daily. Take with or immediately following a meal. 12/05/19   Kathleene Hazel, MD  nitroGLYCERIN (NITROSTAT) 0.4 MG SL tablet Place 1 tablet (0.4 mg total) under the tongue every 5 (five) minutes as needed for chest pain. 11/25/19   Kathleene Hazel, MD    Allergies    Ace inhibitors, Iodinated diagnostic agents, Other, Shellfish-derived products, and Hydrocodone  Review of Systems   Review of Systems  Unable to perform ROS: Dementia  Cardiovascular: Positive for chest pain.     Physical Exam Updated Vital Signs BP 133/75 (BP Location: Right Arm)   Pulse 79   Temp 98.7 F (37.1 C) (Oral)   Resp 18   Ht 5\' 1"  (1.549 m)   Wt 44.4 kg   SpO2 99%   BMI 18.50 kg/m   Physical Exam Vitals and nursing note reviewed.   79 year old female, resting comfortably and in no acute distress. Vital signs are normal. Oxygen saturation is 99%, which is normal. Head is normocephalic and atraumatic. PERRLA, EOMI. Oropharynx is clear. Neck is nontender and supple without adenopathy or JVD. Back is nontender and there is no CVA tenderness. Lungs are clear without rales, wheezes, or rhonchi. Chest is nontender. Heart  has regular rate and rhythm without murmur. Abdomen is soft, flat, nontender without masses or hepatosplenomegaly and peristalsis is normoactive. Extremities have no cyanosis or edema, full range of motion is present. Skin is warm and dry without rash. Neurologic: Mental status is normal, cranial nerves are intact, there are no motor or sensory deficits.  ED Results / Procedures / Treatments   Labs (all labs ordered are listed, but only abnormal results are displayed) Labs Reviewed  BASIC METABOLIC PANEL - Abnormal; Notable for the following components:      Result Value   Glucose, Bld 136 (*)    All other components within normal limits  CBC - Abnormal; Notable for the following components:   RDW 16.2 (*)    All other components within normal limits  TROPONIN I (HIGH SENSITIVITY)  TROPONIN I (HIGH SENSITIVITY)    EKG Kristine [name], Kristine [name] QQ:[id number] 23-Jun-2020 21:03:07 [facility]/ED ROUTINE RECORD Normal sinus rhythm Biatrial enlargement Nonspecific ST and T wave abnormality Prolonged QT Abnormal ECG When compared with ECG of 09/14/2019, QT has lengthened Confirmed by Dione Booze ([id number]) on 06/23/2020 11:32:43 PM 51mm/s 29mm/mV 100Hz  9.0.4 12SL 243 CID: Confirmed By: [id number] Vent. rate 79 BPM PR interval 152 ms QRS  duration 74 ms QT/QTc 442/506 ms P-R-T axes 76 86 79 09/25/1941 (78 yr) Female Black Room:[id number] Loc:11 Technician: 07-Jul-1941 Test ind:  EKG Interpretation  Date/Time:  Saturday June 23 2020 21:03:58 EDT Ventricular Rate:  79 PR Interval:  158 QRS Duration: 70 QT Interval:  364 QTC Calculation: 417 R Axis:   85 Text Interpretation: Normal sinus rhythm Right atrial enlargement Nonspecific ST and T wave abnormality Abnormal ECG When compared with ECG of EARLIER SAME DATE QT has shortened Reconfirmed by 09-29-1991 [phone]) on 06/24/2020 2:41:42 AM   Radiology DG Chest 2 View  Result Date: 06/23/2020 CLINICAL DATA:  Chest pain and tachycardia. EXAM: CHEST - 2 VIEW COMPARISON:  September 25, 2017 FINDINGS: The lungs are hyperinflated. Mild chronic appearing increased lung markings are seen without evidence of acute infiltrate, pleural effusion or pneumothorax. The heart size and mediastinal contours are within normal limits. There is mild calcification of the aortic arch. Mild dextroscoliosis of the lower thoracic spine is seen. IMPRESSION: No active cardiopulmonary disease. Electronically Signed   By: September 27, 2017 M.D.   On: 06/23/2020 21:55    Procedures Procedures   Medications Ordered in ED Medications  sodium chloride flush (NS) 0.9 % injection 3 mL (has no administration in time range)    ED Course  I have reviewed the triage vital signs and the nursing notes.  Pertinent labs & imaging results that were available during my care of the patient were reviewed by me and considered in my medical decision making (see chart for details).  MDM Rules/Calculators/A&P Atypical chest discomfort, palpitations.  It is possible her chest discomfort was directly related to an arrhythmia.  She is currently in sinus rhythm on monitor and ECG shows no arrhythmia.  Old records are reviewed, and she had 3 stents placed in her right coronary artery in 2011 for STEMI.  She has been followed by  cardiology for peripheral vascular disease.  ECG shows no acute changes, labs are unremarkable including normal troponin.  Chest x-ray shows no acute process.  Will check delta troponin.  If negative, she can safely be discharged to follow-up with her cardiologist.  Repeat troponin is unchanged.  She is discharged with instructions to follow-up with her cardiologist.  Return precautions  discussed.  Final Clinical Impression(s) / ED Diagnoses Final diagnoses:  Chest discomfort  Palpitations    Rx / DC Orders ED Discharge Orders    None       Dione BoozeGlick, Wanza Szumski, MD 06/24/20 [phone]

## 2020-07-02 NOTE — Progress Notes (Signed)
Cardiology Office Note    Date:  07/03/2020   ID:  Kristine [name], DOB 1941/08/19, MRN [id number]  PCP:  Renaye RakersBland, Veita, MD  Cardiologist: Verne Carrowhristopher McAlhany, MD EPS: None  Chief Complaint  Patient presents with  . Hospitalization Follow-up    History of Present Illness:  Kristine [name] is a 79 y.o. female  with history of CAD s/p inferior STEMI May 2011 treated with 3 DES in the proximal, mid and distal RCA, HTN, HLD, PAD, COPD here today for cardiac follow up. She has been followed in the past by Dr. Deborah Chalkennant. She is known to have anomalous takeoff of all coronaries. She had abnormal LE arterial dopplers in May 2011 and was seen by Dr. Betti CruzJD Lawson in [facility] in August 2011. Lower extremity arterial dopplers May 2013 which showed ABI of 0.6 on the right and 0.77 on the left. Conservative management has been pursued with her LE arterial disease. There appears to be a moderate right distal SFA stenosis.   Patient last saw Dr. Clifton JamesMcAlhany 11/2019 and was doing well.  No changes made.  Patient went to the ER 06/24/2020 with vague chest discomfort.  History was complicated by her dementia.  Described that maybe her heartbeat going fast.  No arrhythmias documented in the ER.  EKG without acute change.  Normal troponins.   Patient comes in accompanied by her husband. Denies any cardiac complaints. Sometimes has indigestion relieved with drinking a coke or taking a tums. She says she had a dry type of pressure inside her throat when she went to the ER. No regular exercise other than walking around the house. She hasn't complained about any chest tightness, shortness of breath, dizziness or presyncope.      Past Medical History:  Diagnosis Date  . COPD (chronic obstructive pulmonary disease) (HCC)   . Hard of hearing   . History of anemia   . History of blood transfusion   . History of pneumonia   . HTN (hypertension)   . Hyperlipidemia   . Hyperthyroidism    treated wtih radioactive iodine  .  OA (osteoarthritis of spine)   . PVD (peripheral vascular disease) (HCC)    Lower extremity Dopplers  May 2revealed an ABI of 0.83 in the right posterior   . Shortness of breath dyspnea   . STEMI (ST elevation myocardial infarction) [facility](HCC) May 2011   Promus DES in the proximal, mid and distal RCA in May 2011  . Stroke (HCC)   . Urinary frequency   . Urinary urgency     Past Surgical History:  Procedure Laterality Date  . ABDOMINAL HYSTERECTOMY     Had ovarian resection and required lysis of adhesions  . CARDIAC CATHETERIZATION  04/07/2010   EF 60-70%  . CORONARY STENTS     x3  . ESOPHAGOGASTRODUODENOSCOPY N/A 03/10/2015   Procedure: ESOPHAGOGASTRODUODENOSCOPY (EGD);  Surgeon: Wandalee FerdinandSam Ganem, MD;  [facility];  Service: Endoscopy;  Laterality: N/A;  . OVARY SURGERY    . TOTAL HIP ARTHROPLASTY  2004   left  . TOTAL HIP ARTHROPLASTY Right 01/23/2016   Procedure: RIGHT TOTAL HIP ARTHROPLASTY ANTERIOR APPROACH;  Surgeon: Loreta Aveaniel F Murphy, MD;  [facility];  Service: Orthopedics;  Laterality: Right;    Current Medications: Current Meds  Medication Sig  . acetaminophen (TYLENOL) 325 MG tablet Take 650 mg by mouth every 6 (six) hours as needed for mild pain.  Marland Kitchen. albuterol (VENTOLIN HFA) 108 (90 Base) MCG/ACT inhaler Inhale 1-2 puffs into  the lungs every 4 (four) hours as needed for wheezing.   Marland Kitchen ammonium lactate (LAC-HYDRIN) 12 % cream Apply topically as needed for dry skin.  Ailene Ards ELLIPTA 62.5-25 MCG/INH AEPB Inhale 1 puff into the lungs daily.  Marland Kitchen aspirin EC 81 MG tablet Take 81 mg by mouth daily.  Marland Kitchen atorvastatin (LIPITOR) 80 MG tablet Take 1 tablet (80 mg total) by mouth daily.  Marland Kitchen ezetimibe (ZETIA) 10 MG tablet Take 1 tablet (10 mg total) by mouth daily.  . hydrALAZINE (APRESOLINE) 25 MG tablet TAKE 1 TABLET(25 MG) BY MOUTH DAILY. Please keep upcoming appt in December with Dr. Clifton James. Thank you  . hydrochlorothiazide (HYDRODIURIL) 25 MG tablet Take 1 tablet (25 mg total) by  mouth daily.  . methimazole (TAPAZOLE) 5 MG tablet Take 5 mg by mouth daily.  . metoprolol succinate (TOPROL-XL) 50 MG 24 hr tablet Take 1 tablet (50 mg total) by mouth daily. Take with or immediately following a meal.  . nitroGLYCERIN (NITROSTAT) 0.4 MG SL tablet Place 1 tablet (0.4 mg total) under the tongue every 5 (five) minutes as needed for chest pain.     Allergies:   Ace inhibitors, Iodinated diagnostic agents, Other, Shellfish-derived products, and Hydrocodone   Social History   Socioeconomic History  . Marital status: Married    Spouse name: Not on file  . Number of children: 3  . Years of education: Not on file  . Highest education level: Not on file  Occupational History  . Occupation: Retired  Tobacco Use  . Smoking status: Former Smoker    Packs/day: 1.00    Years: 45.00    Pack years: 45.00    Types: Cigarettes    Quit date: 04/07/2010    Years since quitting: 10.2  . Smokeless tobacco: Never Used  Vaping Use  . Vaping Use: Never used  Substance and Sexual Activity  . Alcohol use: Yes    Comment: rarely   . Drug use: No  . Sexual activity: Not on file  Other Topics Concern  . Not on file  Social History Narrative  . Not on file   Social Determinants of Health   Financial Resource Strain:   . Difficulty of Paying Living Expenses:   Food Insecurity:   . Worried About Programme researcher, broadcasting/film/video in the Last Year:   . Barista in the Last Year:   Transportation Needs:   . Freight forwarder (Medical):   Marland Kitchen Lack of Transportation (Non-Medical):   Physical Activity:   . Days of Exercise per Week:   . Minutes of Exercise per Session:   Stress:   . Feeling of Stress :   Social Connections:   . Frequency of Communication with Friends and Family:   . Frequency of Social Gatherings with Friends and Family:   . Attends Religious Services:   . Active Member of Clubs or Organizations:   . Attends Banker Meetings:   Marland Kitchen Marital Status:       Family History:  The patient's family history includes Heart attack in her father; Peripheral vascular disease in her mother.   ROS:   Please see the history of present illness.    ROS All other systems reviewed and are negative.   PHYSICAL EXAM:   VS:  BP 122/80   Pulse 87   Ht 5\' 1"  (1.549 m)   Wt 106 lb 9.6 oz (48.4 kg)   SpO2 97%   BMI 20.14 kg/m  Physical Exam  GEN: Well nourished, well developed, in no acute distress  Neck: no JVD, carotid bruits, or masses Cardiac:RRR; no murmurs, rubs, or gallops  Respiratory:  clear to auscultation bilaterally, normal work of breathing GI: soft, nontender, nondistended, + BS Ext: without cyanosis, clubbing, or edema, Good distal pulses bilaterally Neuro:  Alert and Oriented x 3 Psych: euthymic mood, full affect  Wt Readings from Last 3 Encounters:  07/03/20 106 lb 9.6 oz (48.4 kg)  06/23/20 97 lb 14.2 oz (44.4 kg)  11/25/19 97 lb 12.8 oz (44.4 kg)      Studies/Labs Reviewed:   EKG:  EKG is not ordered today.  The ekg reviewed from 06/25/2020 normal sinus rhythm, no acute change Recent Labs: 09/14/2019: ALT 24 06/23/2020: BUN 17; Creatinine, Ser 0.72; Hemoglobin 13.3; Platelets 367; Potassium 3.7; Sodium 138   Lipid Panel    Component Value Date/Time   CHOL 155 08/31/2019 0000   TRIG 51 08/31/2019 0000   HDL 59 08/31/2019 0000   CHOLHDL 2.6 08/31/2019 0000   CHOLHDL 3 07/31/2014 1153   VLDL 20.6 07/31/2014 1153   LDLCALC 85 08/31/2019 0000   LDLDIRECT 138.9 07/23/2011 0815    Additional studies/ records that were reviewed today include:   No ischemia on NST 10/06/13    ASSESSMENT:    1. Coronary artery disease involving native coronary artery of native heart without angina pectoris   2. Essential hypertension   3. Hyperlipidemia, unspecified hyperlipidemia type   4. Peripheral arterial disease (HCC)   5. Chronic obstructive pulmonary disease, unspecified COPD type (HCC)      PLAN:  In order of problems  listed above:  CAD status post inferior STEMI 2011 treated with 3 DES to the proximal mid and distal RCA, anomalous takeoff of all coronaries-recent trip to the emergency room for vague chest pain EKGs and labs stable.  History complicated by dementia-no chest pain.   Hypertension BP controlled  HLD LDL 67 06/22/20  PAD followed by [facility]  COPD managed by PCP    Medication Adjustments/Labs and Tests Ordered: Current medicines are reviewed at length with the patient today.  Concerns regarding medicines are outlined above.  Medication changes, Labs and Tests ordered today are listed in the Patient Instructions below. Patient Instructions  Medication Instructions:  Your physician recommends that you continue on your current medications as directed. Please refer to the Current Medication list given to you today.  *If you need a refill on your cardiac medications before your next appointment, please call your pharmacy*   Lab Work: None If you have labs (blood work) drawn today and your tests are completely normal, you will receive your results only by: Marland Kitchen MyChart Message (if you have MyChart) OR . A paper copy in the mail If you have any lab test that is abnormal or we need to change your treatment, we will call you to review the results.   Testing/Procedures: None   Follow-Up: At [facility], you and your health needs are our priority.  As part of our continuing mission to provide you with exceptional heart care, we have created designated Provider Care Teams.  These Care Teams include your primary Cardiologist (physician) and Advanced Practice Providers (APPs -  Physician Assistants and Nurse Practitioners) who all work together to provide you with the care you need, when you need it.  We recommend signing up for the patient portal called "MyChart".  Sign up information is provided on this After Visit Summary.  MyChart is  used to connect with patients for Virtual Visits  (Telemedicine).  Patients are able to view lab/test results, encounter notes, upcoming appointments, etc.  Non-urgent messages can be sent to your provider as well.   To learn more about what you can do with MyChart, go to [email or web address].    Your next appointment:   6 month(s)  The format for your next appointment:   In Person  Provider:   You may see Verne Carrow, MD or one of the following Advanced Practice Providers on your designated Care Team:    Ronie Spies, PA-C  Jacolyn Reedy, PA-C    Other Instructions None     Signed, Jacolyn Reedy, PA-C  07/03/2020 9:16 AM    [facility] [address] [place], [place], [place]  [address] Phone: [phone]; Fax: [phone]

## 2020-07-03 ENCOUNTER — Encounter: Payer: Self-pay | Admitting: Physician Assistant

## 2020-07-03 ENCOUNTER — Other Ambulatory Visit: Payer: Self-pay

## 2020-07-03 ENCOUNTER — Ambulatory Visit: Payer: Medicare PPO | Admitting: Physician Assistant

## 2020-07-03 VITALS — BP 122/80 | HR 87 | Ht 61.0 in | Wt 106.6 lb

## 2020-07-03 DIAGNOSIS — I2510 Atherosclerotic heart disease of native coronary artery without angina pectoris: Secondary | ICD-10-CM

## 2020-07-03 DIAGNOSIS — J449 Chronic obstructive pulmonary disease, unspecified: Secondary | ICD-10-CM | POA: Diagnosis not present

## 2020-07-03 DIAGNOSIS — I10 Essential (primary) hypertension: Secondary | ICD-10-CM

## 2020-07-03 DIAGNOSIS — E785 Hyperlipidemia, unspecified: Secondary | ICD-10-CM | POA: Diagnosis not present

## 2020-07-03 DIAGNOSIS — I739 Peripheral vascular disease, unspecified: Secondary | ICD-10-CM

## 2020-07-03 NOTE — Patient Instructions (Signed)
Medication Instructions:  Your physician recommends that you continue on your current medications as directed. Please refer to the Current Medication list given to you today.  *If you need a refill on your cardiac medications before your next appointment, please call your pharmacy*   Lab Work: None If you have labs (blood work) drawn today and your tests are completely normal, you will receive your results only by: . MyChart Message (if you have MyChart) OR . A paper copy in the mail If you have any lab test that is abnormal or we need to change your treatment, we will call you to review the results.   Testing/Procedures: None   Follow-Up: At [facility], you and your health needs are our priority.  As part of our continuing mission to provide you with exceptional heart care, we have created designated Provider Care Teams.  These Care Teams include your primary Cardiologist (physician) and Advanced Practice Providers (APPs -  Physician Assistants and Nurse Practitioners) who all work together to provide you with the care you need, when you need it.  We recommend signing up for the patient portal called "MyChart".  Sign up information is provided on this After Visit Summary.  MyChart is used to connect with patients for Virtual Visits (Telemedicine).  Patients are able to view lab/test results, encounter notes, upcoming appointments, etc.  Non-urgent messages can be sent to your provider as well.   To learn more about what you can do with MyChart, go to [email or web address].    Your next appointment:   6 month(s)  The format for your next appointment:   In Person  Provider:   You may see Christopher McAlhany, MD or one of the following Advanced Practice Providers on your designated Care Team:    Dayna Dunn, PA-C  Michele Lenze, PA-C    Other Instructions None  

## 2020-07-04 ENCOUNTER — Encounter: Payer: Self-pay | Admitting: Podiatry

## 2020-07-04 ENCOUNTER — Ambulatory Visit: Payer: Medicare PPO | Admitting: Podiatry

## 2020-07-04 DIAGNOSIS — M79675 Pain in left toe(s): Secondary | ICD-10-CM

## 2020-07-04 DIAGNOSIS — M79674 Pain in right toe(s): Secondary | ICD-10-CM

## 2020-07-04 DIAGNOSIS — I739 Peripheral vascular disease, unspecified: Secondary | ICD-10-CM

## 2020-07-04 DIAGNOSIS — B351 Tinea unguium: Secondary | ICD-10-CM

## 2020-07-04 NOTE — Progress Notes (Signed)
This patient returns to my office for at risk foot care.  This patient requires this care by a professional since this patient will be at risk due to having PAD, PVD.   This patient is unable to cut nails herself since the patient cannot reach her nails.These nails are painful walking and wearing shoes.  This patient presents for at risk foot care today.  General Appearance  Alert, conversant and in no acute stress.  Vascular  Dorsalis pedis and posterior tibial  pulses are weakly  palpable  bilaterally.  Capillary return is within normal limits  bilaterally. Temperature is within normal limits  bilaterally.  Neurologic  Senn-Weinstein monofilament wire test within normal limits  bilaterally. Muscle power within normal limits bilaterally.  Nails Thick disfigured discolored nails with subungual debris  from hallux to fifth toes bilaterally. No evidence of bacterial infection or drainage bilaterally.  Orthopedic  No limitations of motion  feet .  No crepitus or effusions noted.  No bony pathology or digital deformities noted.  Skin  normotropic skin noted bilaterally.  No signs of infections or ulcers noted.   Porokeratosis sub 3 left foot.  Dry peeling black skin right dorsum forefoot.  Onychomycosis  Pain in right toes  Pain in left toes    Consent was obtained for treatment procedures.   Mechanical debridement of nails 1-5  bilaterally performed with a nail nipper.  Filed with dremel without incident.    Return office visit    5 weeks                  Told patient to return for periodic foot care and evaluation due to potential at risk complications.   Helane Gunther DPM

## 2020-07-11 ENCOUNTER — Other Ambulatory Visit: Payer: Self-pay

## 2020-07-11 MED ORDER — HYDRALAZINE HCL 25 MG PO TABS: ORAL_TABLET | ORAL | 3 refills | Status: DC

## 2020-07-26 DIAGNOSIS — Z0000 Encounter for general adult medical examination without abnormal findings: Secondary | ICD-10-CM | POA: Diagnosis not present

## 2020-07-26 DIAGNOSIS — M25559 Pain in unspecified hip: Secondary | ICD-10-CM | POA: Diagnosis not present

## 2020-07-30 DIAGNOSIS — M25551 Pain in right hip: Secondary | ICD-10-CM | POA: Diagnosis not present

## 2020-07-30 DIAGNOSIS — M25552 Pain in left hip: Secondary | ICD-10-CM | POA: Diagnosis not present

## 2020-07-30 DIAGNOSIS — M545 Low back pain: Secondary | ICD-10-CM | POA: Diagnosis not present

## 2020-07-31 DIAGNOSIS — M79605 Pain in left leg: Secondary | ICD-10-CM | POA: Diagnosis not present

## 2020-07-31 DIAGNOSIS — M79604 Pain in right leg: Secondary | ICD-10-CM | POA: Diagnosis not present

## 2020-07-31 DIAGNOSIS — S39012D Strain of muscle, fascia and tendon of lower back, subsequent encounter: Secondary | ICD-10-CM | POA: Diagnosis not present

## 2020-07-31 DIAGNOSIS — M6281 Muscle weakness (generalized): Secondary | ICD-10-CM | POA: Diagnosis not present

## 2020-08-02 DIAGNOSIS — M6281 Muscle weakness (generalized): Secondary | ICD-10-CM | POA: Diagnosis not present

## 2020-08-02 DIAGNOSIS — M79605 Pain in left leg: Secondary | ICD-10-CM | POA: Diagnosis not present

## 2020-08-02 DIAGNOSIS — S39012D Strain of muscle, fascia and tendon of lower back, subsequent encounter: Secondary | ICD-10-CM | POA: Diagnosis not present

## 2020-08-02 DIAGNOSIS — M79604 Pain in right leg: Secondary | ICD-10-CM | POA: Diagnosis not present

## 2020-08-06 DIAGNOSIS — M79604 Pain in right leg: Secondary | ICD-10-CM | POA: Diagnosis not present

## 2020-08-06 DIAGNOSIS — S39012D Strain of muscle, fascia and tendon of lower back, subsequent encounter: Secondary | ICD-10-CM | POA: Diagnosis not present

## 2020-08-06 DIAGNOSIS — M6281 Muscle weakness (generalized): Secondary | ICD-10-CM | POA: Diagnosis not present

## 2020-08-06 DIAGNOSIS — M79605 Pain in left leg: Secondary | ICD-10-CM | POA: Diagnosis not present

## 2020-08-07 ENCOUNTER — Other Ambulatory Visit: Payer: Self-pay

## 2020-08-07 ENCOUNTER — Ambulatory Visit: Payer: Medicare PPO | Attending: Audiologist | Admitting: Audiologist

## 2020-08-07 DIAGNOSIS — H903 Sensorineural hearing loss, bilateral: Secondary | ICD-10-CM | POA: Diagnosis not present

## 2020-08-07 NOTE — Procedures (Signed)
Outpatient Audiology and [facility] [address] [place], [place]  [address] [phone]  AUDIOLOGICAL  EVALUATION  NAME: Kristine [name]     DOB:   [birth year]      MRN: [id number]                                                                                     DATE: 08/07/2020     REFERENT: Kristine Rakers, MD STATUS: Outpatient DIAGNOSIS: Sensorineural hearing loss, bilateral   History: Kristine [name] was seen for an audiological evaluation. Kristine [name] was accompanied to the appointment by her husband.  Kristine [name] is receiving a hearing evaluation due to concerns for hearing loss in both ears. Kristine [name] has difficulty hearing in most environments, she says the phone is the most difficult. Kristine [name] relies on her husband to help her hear. She cannot hear the crying of her great grandchild unless very close. This difficulty began gradually, Kristine [name] had a hearing test about five years ago and was told she has hearing loss then. No pain or pressure reported in either ear. No tinnitus present in either ears. 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  Medical history negative for any risk factors for hearing loss. No other relevant case history reported.   Evaluation:   Otoscopy showed a clear view of the tympanic membranes, bilaterally  Tympanometry results were consistent with normal function of the middle ear in the left ear, could not obtain seal for right ear  Audiometric testing was completed using conventional audiometry with insert transducer. Speech Recognition Thresholds were consistent with pure tone averages for a steeply sloping loss. Word Recognition was excellent an elevated level and poor at conversation level. Pure tone thresholds show mild sloping to profound sensorineural hearing loss in both ears. Test results are consistent with symmetric hearing.   Results:  The test results were reviewed with Kristine [name] and her husband. The nature  and degree of her hearing loss was explained. Kristine [name] is missing all high frequency and most mid frequency consonants. Therefore all speech becomes muffled as she can only hear the vowel sounds. Kristine [name] has likely been reading lips and using context cues to help her fill in the blanks for a long time. Today, at an average conversation level Kristine [name] could only hear 0-20% of what a recorded voice was saying. When the volume was raised, her ability to understand the speech improved to 80-92%. This significant increase in speech understanding with increased volume shows Kristine [name] is an excellent candidate for hearing aids. However these aids needs to be fit by a professional and over the counter devices are not recommended. Kristine [name] and her husband reported understanding.    Recommendations: 1. Amplification is necessary for both ears. Hearing aids can be purchased from a variety of locations. See provided list for locations in the [place] area. Follow up for audiologic monitoring where ever hearing aids are purchased. Patient will be mailed this report and was given her results to take with her for a hearing aid consult. The hearing test performed today can be used for the next 6 months to program hearing aids, after that the test  will have to be repeated to check for changes.    Kristine [name]  Audiologist, Au.D., CCC-A 08/07/2020  10:47 AM  Cc: Kristine Rakers, MD

## 2020-08-08 ENCOUNTER — Ambulatory Visit: Payer: Medicare PPO | Admitting: Podiatry

## 2020-08-08 ENCOUNTER — Encounter: Payer: Self-pay | Admitting: Podiatry

## 2020-08-08 DIAGNOSIS — Q828 Other specified congenital malformations of skin: Secondary | ICD-10-CM

## 2020-08-08 DIAGNOSIS — I739 Peripheral vascular disease, unspecified: Secondary | ICD-10-CM

## 2020-08-08 NOTE — Progress Notes (Signed)
This patient returns to my office for at risk foot care.  This patient requires this care by a professional since this patient will be at risk due to having PAD, PVD.  This patient has painful callus on her left forefoot.  This callus is painful walking and wearing her shoes.  This patient presents for at risk foot care today.  General Appearance  Alert, conversant and in no acute stress.  Vascular  Dorsalis pedis and posterior tibial  pulses are weakly  palpable  bilaterally.  Capillary return is within normal limits  bilaterally. Temperature is within normal limits  bilaterally.  Neurologic  Senn-Weinstein monofilament wire test within normal limits  bilaterally. Muscle power within normal limits bilaterally.  Nails Thick disfigured discolored nails with subungual debris  from hallux to fifth toes bilaterally. No evidence of bacterial infection or drainage bilaterally.  Orthopedic  No limitations of motion  feet .  No crepitus or effusions noted.  No bony pathology or digital deformities noted.  Skin  normotropic skin noted bilaterally.  No signs of infections or ulcers noted.   Porokeratosis sub 3 left foot.  Dry peeling black skin right dorsum forefoot.  Porokeratosis sub 3 left foot.   Consent was obtained for treatment procedures.   Debridement of porokeratosis with # 15 blade.   Return office visit    5 weeks                  Told patient to return for periodic foot care and evaluation due to potential at risk complications.   Helane Gunther DPM

## 2020-08-10 DIAGNOSIS — M62838 Other muscle spasm: Secondary | ICD-10-CM | POA: Diagnosis not present

## 2020-08-14 DIAGNOSIS — S39012D Strain of muscle, fascia and tendon of lower back, subsequent encounter: Secondary | ICD-10-CM | POA: Diagnosis not present

## 2020-08-14 DIAGNOSIS — M79604 Pain in right leg: Secondary | ICD-10-CM | POA: Diagnosis not present

## 2020-08-14 DIAGNOSIS — M79605 Pain in left leg: Secondary | ICD-10-CM | POA: Diagnosis not present

## 2020-08-14 DIAGNOSIS — M6281 Muscle weakness (generalized): Secondary | ICD-10-CM | POA: Diagnosis not present

## 2020-08-16 DIAGNOSIS — M79604 Pain in right leg: Secondary | ICD-10-CM | POA: Diagnosis not present

## 2020-08-16 DIAGNOSIS — S39012D Strain of muscle, fascia and tendon of lower back, subsequent encounter: Secondary | ICD-10-CM | POA: Diagnosis not present

## 2020-08-16 DIAGNOSIS — M6281 Muscle weakness (generalized): Secondary | ICD-10-CM | POA: Diagnosis not present

## 2020-08-16 DIAGNOSIS — M79605 Pain in left leg: Secondary | ICD-10-CM | POA: Diagnosis not present

## 2020-08-20 DIAGNOSIS — M6281 Muscle weakness (generalized): Secondary | ICD-10-CM | POA: Diagnosis not present

## 2020-08-20 DIAGNOSIS — M79605 Pain in left leg: Secondary | ICD-10-CM | POA: Diagnosis not present

## 2020-08-20 DIAGNOSIS — M79604 Pain in right leg: Secondary | ICD-10-CM | POA: Diagnosis not present

## 2020-08-20 DIAGNOSIS — S39012D Strain of muscle, fascia and tendon of lower back, subsequent encounter: Secondary | ICD-10-CM | POA: Diagnosis not present

## 2020-08-23 DIAGNOSIS — S39012D Strain of muscle, fascia and tendon of lower back, subsequent encounter: Secondary | ICD-10-CM | POA: Diagnosis not present

## 2020-08-23 DIAGNOSIS — M79604 Pain in right leg: Secondary | ICD-10-CM | POA: Diagnosis not present

## 2020-08-23 DIAGNOSIS — M6281 Muscle weakness (generalized): Secondary | ICD-10-CM | POA: Diagnosis not present

## 2020-08-23 DIAGNOSIS — M79605 Pain in left leg: Secondary | ICD-10-CM | POA: Diagnosis not present

## 2020-08-28 DIAGNOSIS — M79605 Pain in left leg: Secondary | ICD-10-CM | POA: Diagnosis not present

## 2020-08-28 DIAGNOSIS — M79604 Pain in right leg: Secondary | ICD-10-CM | POA: Diagnosis not present

## 2020-08-28 DIAGNOSIS — M6281 Muscle weakness (generalized): Secondary | ICD-10-CM | POA: Diagnosis not present

## 2020-08-28 DIAGNOSIS — S39012D Strain of muscle, fascia and tendon of lower back, subsequent encounter: Secondary | ICD-10-CM | POA: Diagnosis not present

## 2020-08-31 DIAGNOSIS — M6281 Muscle weakness (generalized): Secondary | ICD-10-CM | POA: Diagnosis not present

## 2020-08-31 DIAGNOSIS — S39012D Strain of muscle, fascia and tendon of lower back, subsequent encounter: Secondary | ICD-10-CM | POA: Diagnosis not present

## 2020-08-31 DIAGNOSIS — M79605 Pain in left leg: Secondary | ICD-10-CM | POA: Diagnosis not present

## 2020-08-31 DIAGNOSIS — M79604 Pain in right leg: Secondary | ICD-10-CM | POA: Diagnosis not present

## 2020-09-04 DIAGNOSIS — M79604 Pain in right leg: Secondary | ICD-10-CM | POA: Diagnosis not present

## 2020-09-04 DIAGNOSIS — M79605 Pain in left leg: Secondary | ICD-10-CM | POA: Diagnosis not present

## 2020-09-04 DIAGNOSIS — S39012D Strain of muscle, fascia and tendon of lower back, subsequent encounter: Secondary | ICD-10-CM | POA: Diagnosis not present

## 2020-09-04 DIAGNOSIS — M6281 Muscle weakness (generalized): Secondary | ICD-10-CM | POA: Diagnosis not present

## 2020-09-06 DIAGNOSIS — I10 Essential (primary) hypertension: Secondary | ICD-10-CM | POA: Diagnosis not present

## 2020-09-06 DIAGNOSIS — Z681 Body mass index (BMI) 19 or less, adult: Secondary | ICD-10-CM | POA: Diagnosis not present

## 2020-09-06 DIAGNOSIS — F518 Other sleep disorders not due to a substance or known physiological condition: Secondary | ICD-10-CM | POA: Diagnosis not present

## 2020-09-10 DIAGNOSIS — M79605 Pain in left leg: Secondary | ICD-10-CM | POA: Diagnosis not present

## 2020-09-10 DIAGNOSIS — M79604 Pain in right leg: Secondary | ICD-10-CM | POA: Diagnosis not present

## 2020-10-11 DIAGNOSIS — E782 Mixed hyperlipidemia: Secondary | ICD-10-CM | POA: Diagnosis not present

## 2020-10-11 DIAGNOSIS — I639 Cerebral infarction, unspecified: Secondary | ICD-10-CM | POA: Diagnosis not present

## 2020-10-11 DIAGNOSIS — F0151 Vascular dementia with behavioral disturbance: Secondary | ICD-10-CM | POA: Diagnosis not present

## 2020-10-11 DIAGNOSIS — G464 Cerebellar stroke syndrome: Secondary | ICD-10-CM | POA: Diagnosis not present

## 2020-10-17 ENCOUNTER — Other Ambulatory Visit: Payer: Self-pay | Admitting: Cardiovascular Disease

## 2020-11-14 ENCOUNTER — Encounter: Payer: Self-pay | Admitting: Podiatry

## 2020-11-14 ENCOUNTER — Other Ambulatory Visit: Payer: Self-pay

## 2020-11-14 ENCOUNTER — Ambulatory Visit: Payer: Medicare PPO | Admitting: Podiatry

## 2020-11-14 DIAGNOSIS — M79675 Pain in left toe(s): Secondary | ICD-10-CM | POA: Diagnosis not present

## 2020-11-14 DIAGNOSIS — B351 Tinea unguium: Secondary | ICD-10-CM | POA: Diagnosis not present

## 2020-11-14 DIAGNOSIS — M79674 Pain in right toe(s): Secondary | ICD-10-CM

## 2020-11-14 DIAGNOSIS — I739 Peripheral vascular disease, unspecified: Secondary | ICD-10-CM

## 2020-11-14 DIAGNOSIS — Q828 Other specified congenital malformations of skin: Secondary | ICD-10-CM

## 2020-11-14 NOTE — Progress Notes (Signed)
This patient returns to my office for at risk foot care.  This patient requires this care by a professional since this patient will be at risk due to having PAD, PVD.  This patient has painful callus on her left forefoot.  This callus is painful walking and wearing her shoes.  This patient presents for at risk foot care today.  General Appearance  Alert, conversant and in no acute stress.  Vascular  Dorsalis pedis and posterior tibial  pulses are weakly  palpable  bilaterally.  Capillary return is within normal limits  bilaterally. Temperature is within normal limits  bilaterally.  Neurologic  Senn-Weinstein monofilament wire test within normal limits  bilaterally. Muscle power within normal limits bilaterally.  Nails Thick disfigured discolored nails with subungual debris  from hallux to fifth toes bilaterally. No evidence of bacterial infection or drainage bilaterally.  Orthopedic  No limitations of motion  feet .  No crepitus or effusions noted.  No bony pathology or digital deformities noted.  Skin  normotropic skin noted bilaterally.  No signs of infections or ulcers noted.   Porokeratosis sub 3 left foot.  Dry peeling black skin right dorsum forefoot.  Onychomycosis  B/L  Consent was obtained for treatment procedures.   Debridement of nails with nail nipper followed by dremel usage.   Return office visit    9 weeks                  Told patient to return for periodic foot care and evaluation due to potential at risk complications.   Helane Gunther DPM

## 2020-12-04 ENCOUNTER — Ambulatory Visit (HOSPITAL_COMMUNITY)
Admission: RE | Admit: 2020-12-04 | Discharge: 2020-12-04 | Disposition: A | Discharge status: Home / Self Care | Payer: Medicare PPO | Source: Ambulatory Visit | Attending: Cardiology | Admitting: Cardiology

## 2020-12-04 ENCOUNTER — Other Ambulatory Visit (HOSPITAL_COMMUNITY): Payer: Self-pay | Admitting: Cardiovascular Disease

## 2020-12-04 ENCOUNTER — Other Ambulatory Visit: Payer: Self-pay

## 2020-12-04 DIAGNOSIS — I739 Peripheral vascular disease, unspecified: Secondary | ICD-10-CM

## 2020-12-06 ENCOUNTER — Other Ambulatory Visit: Payer: Self-pay | Admitting: Cardiovascular Disease

## 2020-12-06 DIAGNOSIS — I2510 Atherosclerotic heart disease of native coronary artery without angina pectoris: Secondary | ICD-10-CM

## 2020-12-19 ENCOUNTER — Ambulatory Visit (INDEPENDENT_AMBULATORY_CARE_PROVIDER_SITE_OTHER): Payer: Medicare PPO | Admitting: Neurology

## 2020-12-19 ENCOUNTER — Encounter: Payer: Self-pay | Admitting: Neurology

## 2020-12-19 VITALS — BP 105/60 | HR 79 | Ht 61.0 in | Wt 108.5 lb

## 2020-12-19 DIAGNOSIS — R0683 Snoring: Secondary | ICD-10-CM | POA: Diagnosis not present

## 2020-12-19 DIAGNOSIS — G4719 Other hypersomnia: Secondary | ICD-10-CM | POA: Insufficient documentation

## 2020-12-19 DIAGNOSIS — R269 Unspecified abnormalities of gait and mobility: Secondary | ICD-10-CM | POA: Diagnosis not present

## 2020-12-19 DIAGNOSIS — G4750 Parasomnia, unspecified: Secondary | ICD-10-CM | POA: Diagnosis not present

## 2020-12-19 DIAGNOSIS — G4752 REM sleep behavior disorder: Secondary | ICD-10-CM | POA: Insufficient documentation

## 2020-12-19 MED ORDER — CLONAZEPAM 0.5 MG PO TABS
ORAL_TABLET | ORAL | 5 refills | Status: DC
Start: 2020-12-19 — End: 2021-07-01

## 2020-12-19 NOTE — Progress Notes (Signed)
[facility]  PATIENT: Kristine [name] DOB: December 08, 1941  REFERRING DOCTOR OR PCP: Renaye Rakers MD SOURCE: Patient, husband, notes from PCP  _________________________________   HISTORICAL  CHIEF COMPLAINT:  Chief Complaint  Patient presents with  . New Patient (Initial Visit)    RM 12 with husband. Paper referral from Renaye Rakers, MD (PCP) for vivid nightmares, no prior sleep study.Talks in sleep, snoring, moving/jumping in bed.     HISTORY OF PRESENT ILLNESS:  I had the pleasure of seeing your patient, Kristine [name], at [facility] for neurologic consultation regarding her parasomnias including vivid nightmares, somniloquy and other behaviors.  She is a 80 year old woman who began to talk in her sleep and have active dreams about a years ago.   She is talking in her sleep nightly - sometimes mumbling and sometimes formed conversation.   She has some purposeful movements like reaching or clapping but has had no violent dreams.   She has yelled out in her sleep but this is not violent or defensive.   She has not gotten out of bed.    Episodes can occur any time after 1-2 hours of sleep.   She falls asleep quickly.  She snores at night and sometimes has blowing/puffing but no definite pauses or gasps.   She feels refreshed when she wakes up.  She sometimes is sleepy in the eening  She does not walk as well as she used to and she t feels it is due to bilateral THR and calluses.   She sees podiatry for the calluses.   Her stride is reduced.   She takes a lot of steps to turn.   She has fallen a couple times (forward).   She and her husband note she has mild cognitive issues the past year.     She has HTN, CAD and hyperthyroidism.     EPWORTH SLEEPINESS SCALE  On a scale of 0 - 3 what is the chance of dozing:  Sitting and Reading:   2 Watching TV:    3 Sitting inactive in a public place: 0 Passenger in car for one hour: 3 Lying down to rest in the  afternoon: 3 Sitting and talking to someone: 1 Sitting quietly after lunch:  3 In a car, stopped in traffic:  0  Total (out of 24):   15/24   Moderate excessive daytime sleepiness.       REVIEW OF SYSTEMS: Constitutional: No fevers, chills, sweats, or change in appetite Eyes: No visual changes, double vision, eye pain Ear, nose and throat: No hearing loss, ear pain, nasal congestion, sore throat Cardiovascular: No chest pain, palpitations Respiratory: No shortness of breath at rest or with exertion.   No wheezes GastrointestinaI: No nausea, vomiting, diarrhea, abdominal pain, fecal incontinence Genitourinary: No dysuria, urinary retention or frequency.  No nocturia. Musculoskeletal: No neck pain, back pain Integumentary: No rash, pruritus, skin lesions Neurological: as above Psychiatric: No depression at this time.  No anxiety Endocrine: No palpitations, diaphoresis, change in appetite, change in weigh or increased thirst Hematologic/Lymphatic: No anemia, purpura, petechiae. Allergic/Immunologic: No itchy/runny eyes, nasal congestion, recent allergic reactions, rashes  ALLERGIES: Allergies  Allergen Reactions  . Ace Inhibitors Swelling    Angioedema of the tongue  . Iodinated Diagnostic Agents Anaphylaxis  . Other Anaphylaxis    Allergic to melons, nuts  . Shellfish-Derived Products Anaphylaxis  . Hydrocodone Other (See Comments)    unspecified    HOME MEDICATIONS:  Current Outpatient Medications:  .  acetaminophen (TYLENOL) 325 MG tablet, Take 650 mg by mouth every 6 (six) hours as needed for mild pain., Disp: , Rfl:  .  albuterol (VENTOLIN HFA) 108 (90 Base) MCG/ACT inhaler, Inhale 1-2 puffs into the lungs every 4 (four) hours as needed for wheezing. , Disp: , Rfl:  .  ammonium lactate (LAC-HYDRIN) 12 % cream, Apply topically as needed for dry skin., Disp: 385 g, Rfl: 0 .  ANORO ELLIPTA 62.5-25 MCG/INH AEPB, Inhale 1 puff into the lungs daily., Disp: , Rfl: 3 .   aspirin EC 81 MG tablet, Take 81 mg by mouth daily., Disp: , Rfl:  .  atorvastatin (LIPITOR) 80 MG tablet, Take 1 tablet (80 mg total) by mouth daily., Disp: 90 tablet, Rfl: 2 .  ezetimibe (ZETIA) 10 MG tablet, TAKE 1 TABLET(10 MG) BY MOUTH DAILY, Disp: 90 tablet, Rfl: 2 .  hydrALAZINE (APRESOLINE) 25 MG tablet, TAKE 1 TABLET(25 MG) BY MOUTH DAILY., Disp: 90 tablet, Rfl: 3 .  hydrochlorothiazide (HYDRODIURIL) 25 MG tablet, Take 1 tablet (25 mg total) by mouth daily., Disp: 90 tablet, Rfl: 3 .  MEDROL 4 MG TBPK tablet, Take by mouth., Disp: , Rfl:  .  methimazole (TAPAZOLE) 5 MG tablet, Take 5 mg by mouth daily., Disp: , Rfl:  .  metoprolol succinate (TOPROL-XL) 50 MG 24 hr tablet, Take 1 tablet (50 mg total) by mouth daily. Take with or immediately following a meal., Disp: 90 tablet, Rfl: 3 .  nitroGLYCERIN (NITROSTAT) 0.4 MG SL tablet, PLACE 1 TABLET SL EVERY 5 MINUTES AS NEEDED FOR CHEST PAIN, Disp: 25 tablet, Rfl: 3  PAST MEDICAL HISTORY: Past Medical History:  Diagnosis Date  . COPD (chronic obstructive pulmonary disease) (HCC)   . Hard of hearing   . History of anemia   . History of blood transfusion   . History of pneumonia   . HTN (hypertension)   . Hyperlipidemia   . Hyperthyroidism    treated wtih radioactive iodine  . OA (osteoarthritis of spine)   . PVD (peripheral vascular disease) (HCC)    Lower extremity Dopplers  May 2revealed an ABI of 0.83 in the right posterior   . Shortness of breath dyspnea   . STEMI (ST elevation myocardial infarction) [facility](HCC) May 2011   Promus DES in the proximal, mid and distal RCA in May 2011  . Stroke (HCC)   . Urinary frequency   . Urinary urgency     PAST SURGICAL HISTORY: Past Surgical History:  Procedure Laterality Date  . ABDOMINAL HYSTERECTOMY     Had ovarian resection and required lysis of adhesions  . CARDIAC CATHETERIZATION  04/07/2010   EF 60-70%  . CORONARY STENTS     x3  . ESOPHAGOGASTRODUODENOSCOPY N/A 03/10/2015   Procedure:  ESOPHAGOGASTRODUODENOSCOPY (EGD);  Surgeon: Wandalee FerdinandSam Ganem, MD;  [facility];  Service: Endoscopy;  Laterality: N/A;  . OVARY SURGERY    . TOTAL HIP ARTHROPLASTY  2004   left  . TOTAL HIP ARTHROPLASTY Right 01/23/2016   Procedure: RIGHT TOTAL HIP ARTHROPLASTY ANTERIOR APPROACH;  Surgeon: Loreta Aveaniel F Murphy, MD;  [facility];  Service: Orthopedics;  Laterality: Right;    FAMILY HISTORY: Family History  Problem Relation Age of Onset  . Peripheral vascular disease Mother   . Heart attack Father     SOCIAL HISTORY:  Social History   Socioeconomic History  . Marital status: Married    Spouse name: Not on file  . Number of children: 3  . Years of education:  Not on file  . Highest education level: Not on file  Occupational History  . Occupation: Retired  Tobacco Use  . Smoking status: Former Smoker    Packs/day: 1.00    Years: 45.00    Pack years: 45.00    Types: Cigarettes    Quit date: 04/07/2010    Years since quitting: 10.7  . Smokeless tobacco: Never Used  Vaping Use  . Vaping Use: Never used  Substance and Sexual Activity  . Alcohol use: Yes    Comment: rarely   . Drug use: No  . Sexual activity: Not on file  Other Topics Concern  . Not on file  Social History Narrative   Right handed   Tea sometimes, decaf coffee   Social Determinants of Health   Financial Resource Strain: Not on file  Food Insecurity: Not on file  Transportation Needs: Not on file  Physical Activity: Not on file  Stress: Not on file  Social Connections: Not on file  Intimate Partner Violence: Not on file     PHYSICAL EXAM  Vitals:   12/19/20 1013  BP: 105/60  Pulse: 79  Weight: 108 lb 8 oz (49.2 kg)  Height: 5\' 1"  (1.549 m)    Body mass index is 20.5 kg/m.   General: The patient is well-developed and well-nourished and in no acute distress  HEENT:  Head is [place]/AT.  Sclera are anicteric.  Neck: No carotid bruits are noted.  The neck is nontender.  Cardiovascular: The  heart has a regular rate and rhythm with a normal S1 and S2. There were no murmurs, gallops or rubs.    Skin: Extremities are without rash or  edema.  Musculoskeletal:  Back is nontender  Neurologic Exam  Mental status: The patient is alert and oriented x 2 1/2 (off by one day) at the time of the examination. The patient has apparent normal recent and remote memory, with mildly reduced attention.   Speech is normal.  Cranial nerves: Extraocular movements are full.  Visual fields are full.  Facial symmetry is present. There is good facial sensation to soft touch bilaterally.Facial strength is normal.  Trapezius and sternocleidomastoid strength is normal. No dysarthria is noted.  The tongue is midline, and the patient has symmetric elevation of the soft palate. No obvious hearing deficits are noted.  Motor:  Muscle bulk is normal.   Tone is slightly increased in the arms. Strength is  5 / 5 in all 4 extremities.   Sensory: Sensory testing is intact to pinprick, soft touch and vibration sensation in all 4 extremities.  Coordination: Cerebellar testing reveals good finger-nose-finger and heel-to-shin bilaterally.  Gait and station: Station is normal.  Gait has a reduced stride.  She takes 8 steps to turn 180 degrees.  She has retropulsion, even when posture is just mildly disturbed.  Romberg is negative.   Reflexes: Deep tendon reflexes are symmetric and normal bilaterally.   Plantar responses are flexor.    DIAGNOSTIC DATA (LABS, IMAGING, TESTING) - I reviewed patient records, labs, notes, testing and imaging myself where available.  Lab Results  Component Value Date   WBC 5.7 06/23/2020   HGB 13.3 06/23/2020   HCT 41.9 06/23/2020   MCV 92.1 06/23/2020   PLT 367 06/23/2020      Component Value Date/Time   NA 138 06/23/2020 2120   K 3.7 06/23/2020 2120   CL 106 06/23/2020 2120   CO2 22 06/23/2020 2120   GLUCOSE 136 (H) 06/23/2020 2120  BUN 17 06/23/2020 2120   CREATININE 0.72  06/23/2020 2120   CALCIUM 9.4 06/23/2020 2120   PROT 7.3 09/14/2019 1745   PROT 7.6 08/31/2019 0000   ALBUMIN 4.0 09/14/2019 1745   ALBUMIN 4.5 08/31/2019 0000   AST 37 09/14/2019 1745   ALT 24 09/14/2019 1745   ALKPHOS 74 09/14/2019 1745   BILITOT 1.1 09/14/2019 1745   BILITOT 0.9 08/31/2019 0000   GFRNONAA >60 06/23/2020 2120   GFRAA >60 06/23/2020 2120   Lab Results  Component Value Date   CHOL 155 08/31/2019   HDL 59 08/31/2019   LDLCALC 85 08/31/2019   LDLDIRECT 138.9 07/23/2011   TRIG 51 08/31/2019   CHOLHDL 2.6 08/31/2019   Lab Results  Component Value Date   HGBA1C  05/30/2010    5.5 (NOTE)                                                                       According to the ADA Clinical Practice Recommendations for 2011, when HbA1c is used as a screening test:   >=6.5%   Diagnostic of Diabetes Mellitus           (if abnormal result  is confirmed)  5.7-6.4%   Increased risk of developing Diabetes Mellitus  References:Diagnosis and Classification of Diabetes Mellitus,Diabetes Care,2011,34(Suppl 1):S62-S69 and Standards of Medical Care in         Diabetes - 2011,Diabetes Care,2011,34  (Suppl 1):S11-S61.   No results found for: VITAMINB12 Lab Results  Component Value Date   TSH 0.283 (L) 05/31/2015       ASSESSMENT AND PLAN  Parasomnia, unspecified type - Plan: Nocturnal polysomnography  Snoring - Plan: Nocturnal polysomnography  Excessive daytime sleepiness - Plan: Nocturnal polysomnography  Gait disturbance   In summary, Mrs. Kotowski is a 80 year old woman with parasomnias including soliloquy and purposeful movements.  She does not appear to be fighting off anybody or try to get out of bed.  Therefore, it is unclear if the spells are occurring during non-REM or REM sleep.  Additionally, she has gait disturbance.  She also has noted some mild cognitive disturbance.  If the parasomnias are REM related, she might have Lewy body disease.  She also snores and has  some sleep disordered breathing.  I am going to place her on low-dose clonazepam (initially 0.25 mg and can increase to 0.5 mg) at bedtime to help with the parasomnias.  We will check a sleep study.  This will allow [place] to further evaluate the parasomnias and also determine if she has sleep apnea.  They will return to see me in 3 months or sooner if there are new or worsening neurologic symptoms.  Thank you for asking me to see Ms. Jewel Baize.  Please let me know if I can be of further assistance with her or other patients in the future.   Mykeria Garman A. Epimenio Foot, MD, [facility] 12/19/2020, 10:28 AM Certified in Neurology, Clinical Neurophysiology, Sleep Medicine and Neuroimaging  [facility] [address] [place], [place] [address] [phone]

## 2021-01-08 ENCOUNTER — Ambulatory Visit (INDEPENDENT_AMBULATORY_CARE_PROVIDER_SITE_OTHER): Payer: Medicare PPO | Admitting: Neurology

## 2021-01-08 DIAGNOSIS — G4750 Parasomnia, unspecified: Secondary | ICD-10-CM

## 2021-01-08 DIAGNOSIS — R0683 Snoring: Secondary | ICD-10-CM | POA: Diagnosis not present

## 2021-01-08 DIAGNOSIS — G4719 Other hypersomnia: Secondary | ICD-10-CM

## 2021-01-11 DIAGNOSIS — E639 Nutritional deficiency, unspecified: Secondary | ICD-10-CM | POA: Diagnosis not present

## 2021-01-11 DIAGNOSIS — I639 Cerebral infarction, unspecified: Secondary | ICD-10-CM | POA: Diagnosis not present

## 2021-01-11 DIAGNOSIS — F0151 Vascular dementia with behavioral disturbance: Secondary | ICD-10-CM | POA: Diagnosis not present

## 2021-01-11 DIAGNOSIS — M130 Polyarthritis, unspecified: Secondary | ICD-10-CM | POA: Diagnosis not present

## 2021-01-11 DIAGNOSIS — F518 Other sleep disorders not due to a substance or known physiological condition: Secondary | ICD-10-CM | POA: Diagnosis not present

## 2021-01-11 DIAGNOSIS — E782 Mixed hyperlipidemia: Secondary | ICD-10-CM | POA: Diagnosis not present

## 2021-01-11 DIAGNOSIS — E079 Disorder of thyroid, unspecified: Secondary | ICD-10-CM | POA: Diagnosis not present

## 2021-01-11 DIAGNOSIS — I10 Essential (primary) hypertension: Secondary | ICD-10-CM | POA: Diagnosis not present

## 2021-01-12 NOTE — Progress Notes (Signed)
PATIENT'S NAME:  Kristine, [name] DOB:      1941-04-30      MR#:    [id number]     DATE OF RECORDING: 01/08/2021 REFERRING M.D.:  Renaye Rakers MD Study Performed:   Baseline Polysomnogram HISTORY:  --- A 80 year old woman who began to talk in her sleep and have active dreams about a years ago.   She is talking in her sleep nightly - sometimes mumbling and sometimes formed conversation.   She has some purposeful movements like reaching or clapping but has had no violent dreams.   She has yelled out in her sleep but this is not violent or defensive.   She has not gotten out of bed.    Episodes can occur any time after 1-2 hours of sleep.   She falls asleep quickly.  She snores at night and sometimes has blowing/puffing but no definite pauses or gasps.   She feels refreshed when she wakes up.  She sometimes is sleepy in the evening    She has HTN, CAD and hyperthyroidism.    The patient endorsed the Epworth Sleepiness Scale at 15 points.    The patient's weight 108 pounds with a height of 61 (inches), resulting in a BMI of 20.4 kg/m2.  The patient's neck circumference measured 12.5 inches.  CURRENT MEDICATIONS: Tylenol, Albuterol, Ammonium Lactate, Anoro Ellipta, Aspirin, Atorvastatin, Ezetimibe, Hydralazine, Hydrochlorothiazide, Medrol, Methimazole, Toprol XL, Nitroglycerin   PROCEDURE:  This is a multichannel digital polysomnogram utilizing the Somnostar 11.2 system.  Electrodes and sensors were applied and monitored per AASM Specifications.   EEG, EOG, Chin and Limb EMG, were sampled at 200 Hz.  ECG, Snore and Nasal Pressure, Thermal Airflow, Respiratory Effort, CPAP Flow and Pressure, Oximetry was sampled at 50 Hz. Digital video and audio were recorded.      BASELINE STUDY  Lights Out was at 21:03 and Lights On at 04:52.  Total recording time (TRT) was 469 minutes, with a total sleep time (TST) of 300 minutes.   The patient's sleep latency was 0 minutes.  REM latency was 357 minutes.  The sleep  efficiency was 64. %.     SLEEP ARCHITECTURE: WASO (Wake after sleep onset) was 169 minutes.  There were 16 minutes in Stage N1, 270.5 minutes Stage N2, 0 minutes Stage N3 and 13.5 minutes in Stage REM.  The percentage of Stage N1 was 5.3%, Stage N2 was 90.2%, Stage N3 was 0% and Stage R (REM sleep) was 4.5%.  The arousals were noted as: 27 were spontaneous, 6 were associated with PLMs, 0 were associated with respiratory events.    RESPIRATORY ANALYSIS:  There were a total of 7 respiratory events:  0 obstructive apneas, 0 central apneas and 0 mixed apneas with a total of 0 apneas and an apnea index (AI) of 0 /hour. There were 7 hypopneas with a hypopnea index of 1.4 /hour. The patient also had 0 respiratory event related arousals (RERAs).      The total APNEA/HYPOPNEA INDEX (AHI) was 1.4/hour and the total RESPIRATORY DISTURBANCE INDEX was  1.4 /hour.  3 events occurred in REM sleep and 8 events in NREM. The REM AHI was  13.3 /hour, versus a non-REM AHI of .8. The patient spent 141 minutes of total sleep time in the supine position and 159 minutes in non-supine. The supine AHI was 2.6 versus a non-supine AHI of 0.4.  OXYGEN SATURATION & C02:  The Wake baseline 02 saturation was 0%, with the lowest being 81%. Time  spent below 89% saturation equaled 2 minutes.  PERIODIC LIMB MOVEMENTS:   The patient had a total of 329 Periodic Limb Movements.  The Periodic Limb Movement (PLM) index was 65.8 and the PLM Arousal index was 1.2/hour.  OTHER Audio and video analysis showed sleep talking during REM sleep.    The patient took 2 bathroom breaks.  Snoring was noted.  EKG showed normal sinus rhythm (NSR).   IMPRESSION:  1.  No significant OSA throughout most of the night (AHI = 1.4).  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  Sleep talking during REM Sleep 3.  Rapid sleep onset (0 minutes) with sleep efficiency = 64%.  No N3 sleep recorded   RECOMMENDATIONS: 1. If the snoring if  bothersome, an oral appliance could be considered.  CPAP is not indicated. 2. The somniloquy could be due to a REM sleep disorder.  If this worsens, consider a benzodiazepine or other treatment 3. Follow up with Dr. Epimenio Foot   I certify that I have reviewed the entire raw data recording prior to the issuance of this report in accordance with the Standards of Accreditation of the American Academy of Sleep Medicine (AASM)  Chaelyn Bunyan A. Epimenio Foot, MD, PhD, FAAN Certified in Neurology, Clinical Neurophysiology, Sleep Medicine, Pain Medicine and Neuroimaging Director, [facility]  [facility] [address], [address] [place], [place] [address] [phone]   PATIENT'S NAME:  Kristine, [name] DOB:      1941-07-19      MR#:    [id number]     DATE OF RECORDING: 01/08/2021 REFERRING M.D.:  Renaye Rakers MD Study Performed:   Baseline Polysomnogram HISTORY:  --- A 80 year old woman who began to talk in her sleep and have active dreams about a years ago.   She is talking in her sleep nightly - sometimes mumbling and sometimes formed conversation.   She has some purposeful movements like reaching or clapping but has had no violent dreams.   She has yelled out in her sleep but this is not violent or defensive.   She has not gotten out of bed.    Episodes can occur any time after 1-2 hours of sleep.   She falls asleep quickly.  She snores at night and sometimes has blowing/puffing but no definite pauses or gasps.   She feels refreshed when she wakes up.  She sometimes is sleepy in the evening    She has HTN, CAD and hyperthyroidism.    The patient endorsed the Epworth Sleepiness Scale at 15 points.    The patient's weight 108 pounds with a height of 61 (inches), resulting in a BMI of 20.4 kg/m2.  The patient's neck circumference measured 12.5 inches.  CURRENT MEDICATIONS: Tylenol, Albuterol, Ammonium Lactate, Anoro Ellipta, Aspirin, Atorvastatin,  Ezetimibe, Hydralazine, Hydrochlorothiazide, Medrol, Methimazole, Toprol XL, Nitroglycerin   PROCEDURE:  This is a multichannel digital polysomnogram utilizing the Somnostar 11.2 system.  Electrodes and sensors were applied and monitored per AASM Specifications.   EEG, EOG, Chin and Limb EMG, were sampled at 200 Hz.  ECG, Snore and Nasal Pressure, Thermal Airflow, Respiratory Effort, CPAP Flow and Pressure, Oximetry was sampled at 50 Hz. Digital video and audio were recorded.      BASELINE STUDY  Lights Out was at 21:03 and Lights On at 04:52.  Total recording time (TRT) was 469 minutes, with a total sleep time (TST) of 300 minutes.   The patient's sleep latency was 0  minutes.  REM latency was 357 minutes.  The sleep efficiency was 64. %.     SLEEP ARCHITECTURE: WASO (Wake after sleep onset) was 169 minutes.  There were 16 minutes in Stage N1, 270.5 minutes Stage N2, 0 minutes Stage N3 and 13.5 minutes in Stage REM.  The percentage of Stage N1 was 5.3%, Stage N2 was 90.2%, Stage N3 was 0% and Stage R (REM sleep) was 4.5%.  The arousals were noted as: 27 were spontaneous, 6 were associated with PLMs, 0 were associated with respiratory events.    RESPIRATORY ANALYSIS:  There were a total of 7 respiratory events:  0 obstructive apneas, 0 central apneas and 0 mixed apneas with a total of 0 apneas and an apnea index (AI) of 0 /hour. There were 7 hypopneas with a hypopnea index of 1.4 /hour. The patient also had 0 respiratory event related arousals (RERAs).      The total APNEA/HYPOPNEA INDEX (AHI) was 1.4/hour and the total RESPIRATORY DISTURBANCE INDEX was  1.4 /hour.  3 events occurred in REM sleep and 8 events in NREM. The REM AHI was  13.3 /hour, versus a non-REM AHI of .8. The patient spent 141 minutes of total sleep time in the supine position and 159 minutes in non-supine. The supine AHI was 2.6 versus a non-supine AHI of 0.4.  OXYGEN SATURATION & C02:  The Wake baseline 02 saturation was 0%, with  the lowest being 81%. Time spent below 89% saturation equaled 2 minutes.  PERIODIC LIMB MOVEMENTS:   The patient had a total of 329 Periodic Limb Movements.  The Periodic Limb Movement (PLM) index was 65.8 and the PLM Arousal index was 1.2/hour.  OTHER Audio and video analysis showed sleep talking during REM sleep.    The patient took 2 bathroom breaks.  Snoring was noted.  EKG showed normal sinus rhythm (NSR).   IMPRESSION:  1. No significant OSA throughout most of the night (AHI = 1.4).  However, mild REM associated OSA is noted (REM AHI = 13.3) 2. Sleep talking during REM Sleep 3. Rapid sleep onset (0 minutes) with sleep efficiency = 64%.  No N3 sleep recorded   RECOMMENDATIONS: 4. If the snoring if bothersome, an oral appliance could be considered.  CPAP is not indicated. 5. The somniloquy could be due to a REM sleep disorder.  If this worsens, consider a benzodiazepine or other treatment 6. Follow up with Dr. Epimenio Foot   I certify that I have reviewed the entire raw data recording prior to the issuance of this report in accordance with the Standards of Accreditation of the American Academy of Sleep Medicine (AASM)   Shearon Balo, PhD Diplomat, American Board of Psychiatry and Neurology,  Sleep Medicine

## 2021-01-14 ENCOUNTER — Telehealth: Payer: Self-pay | Admitting: *Deleted

## 2021-01-14 NOTE — Telephone Encounter (Signed)
-----   Message from Asa Lente, MD sent at 01/12/2021 12:39 PM EST ----- Regarding: PSG Please let her know the PSG showed that she talked during dream -sleep (REM)    We had started clonazepam at the last visit and that will hopefully help

## 2021-01-14 NOTE — Telephone Encounter (Signed)
Called and spoke with pt/husband. Relayed Results per Dr. Epimenio Foot note. They verbalized understanding. She recently saew Dr. Parke Simmers who recommended she try going to 1 tablet of clonazepam. She is sleeping calmer. Started 1 tablet po qhs 01/11/21. Made next appt for 03/27/21 at 3:30pm with Dr. Epimenio Foot.

## 2021-01-16 ENCOUNTER — Encounter: Payer: Self-pay | Admitting: Podiatry

## 2021-01-16 ENCOUNTER — Ambulatory Visit: Payer: Medicare PPO | Admitting: Podiatry

## 2021-01-16 ENCOUNTER — Other Ambulatory Visit: Payer: Self-pay

## 2021-01-16 DIAGNOSIS — B351 Tinea unguium: Secondary | ICD-10-CM | POA: Diagnosis not present

## 2021-01-16 DIAGNOSIS — M79674 Pain in right toe(s): Secondary | ICD-10-CM

## 2021-01-16 DIAGNOSIS — M79675 Pain in left toe(s): Secondary | ICD-10-CM | POA: Diagnosis not present

## 2021-01-16 DIAGNOSIS — Q828 Other specified congenital malformations of skin: Secondary | ICD-10-CM

## 2021-01-16 DIAGNOSIS — I739 Peripheral vascular disease, unspecified: Secondary | ICD-10-CM

## 2021-01-16 NOTE — Progress Notes (Signed)
This patient returns to my office for at risk foot care.  This patient requires this care by a professional since this patient will be at risk due to having PAD, PVD.  This patient has painful callus on her left forefoot.  This callus is painful walking and wearing her shoes.  This patient presents for at risk foot care today.  General Appearance  Alert, conversant and in no acute stress.  Vascular  Dorsalis pedis and posterior tibial  pulses are weakly  palpable  bilaterally.  Capillary return is within normal limits  bilaterally. Temperature is within normal limits  bilaterally.  Neurologic  Senn-Weinstein monofilament wire test within normal limits  bilaterally. Muscle power within normal limits bilaterally.  Nails Thick disfigured discolored nails with subungual debris  from hallux to fifth toes bilaterally. No evidence of bacterial infection or drainage bilaterally.  Orthopedic  No limitations of motion  feet .  No crepitus or effusions noted.  No bony pathology or digital deformities noted.  Skin  normotropic skin noted bilaterally.  No signs of infections or ulcers noted.   Porokeratosis sub 3 left foot.  Dry peeling black skin right dorsum forefoot.  Onychomycosis  B/L  Porokeratosis  Consent was obtained for treatment procedures.   Debridement of nails with nail nipper followed by dremel usage.  Debride callus with # 15 blade.   Return office visit    9 weeks                  Told patient to return for periodic foot care and evaluation due to potential at risk complications.   Helane Gunther DPM

## 2021-03-08 DIAGNOSIS — H903 Sensorineural hearing loss, bilateral: Secondary | ICD-10-CM | POA: Diagnosis not present

## 2021-03-20 ENCOUNTER — Ambulatory Visit: Payer: Medicare PPO | Admitting: Podiatry

## 2021-03-20 ENCOUNTER — Other Ambulatory Visit: Payer: Self-pay

## 2021-03-20 ENCOUNTER — Encounter: Payer: Self-pay | Admitting: Podiatry

## 2021-03-20 DIAGNOSIS — M79675 Pain in left toe(s): Secondary | ICD-10-CM | POA: Diagnosis not present

## 2021-03-20 DIAGNOSIS — I739 Peripheral vascular disease, unspecified: Secondary | ICD-10-CM

## 2021-03-20 DIAGNOSIS — Q828 Other specified congenital malformations of skin: Secondary | ICD-10-CM

## 2021-03-20 DIAGNOSIS — B351 Tinea unguium: Secondary | ICD-10-CM | POA: Diagnosis not present

## 2021-03-20 DIAGNOSIS — M79674 Pain in right toe(s): Secondary | ICD-10-CM

## 2021-03-20 NOTE — Progress Notes (Signed)
This patient returns to my office for at risk foot care.  This patient requires this care by a professional since this patient will be at risk due to having PAD, PVD.  This patient has painful callus on her left forefoot.  This callus is painful walking and wearing her shoes.  This patient presents for at risk foot care today.  General Appearance  Alert, conversant and in no acute stress.  Vascular  Dorsalis pedis and posterior tibial  pulses are weakly  palpable  bilaterally.  Capillary return is within normal limits  bilaterally. Temperature is within normal limits  bilaterally.  Neurologic  Senn-Weinstein monofilament wire test within normal limits  bilaterally. Muscle power within normal limits bilaterally.  Nails Thick disfigured discolored nails with subungual debris  from hallux to fifth toes bilaterally. No evidence of bacterial infection or drainage bilaterally.  Orthopedic  No limitations of motion  feet .  No crepitus or effusions noted.  No bony pathology or digital deformities noted.  Skin  normotropic skin noted bilaterally.  No signs of infections or ulcers noted.   Porokeratosis sub 3 left foot.  Dry peeling black skin right dorsum forefoot.  Onychomycosis  B/L  Porokeratosis  Consent was obtained for treatment procedures.   Debridement of nails with nail nipper followed by dremel usage.  Debride callus with # 15 blade.   Return office visit    9 weeks                  Told patient to return for periodic foot care and evaluation due to potential at risk complications.   Helane Gunther DPM

## 2021-03-27 ENCOUNTER — Ambulatory Visit: Payer: Self-pay | Admitting: Neurology

## 2021-03-29 ENCOUNTER — Other Ambulatory Visit: Payer: Self-pay

## 2021-03-29 MED ORDER — ATORVASTATIN CALCIUM 80 MG PO TABS: 80.0000 mg | ORAL_TABLET | Freq: Every day | ORAL | 0 refills | Status: DC

## 2021-04-08 ENCOUNTER — Encounter: Payer: Self-pay | Admitting: Neurology

## 2021-04-08 ENCOUNTER — Ambulatory Visit: Payer: Medicare PPO | Admitting: Neurology

## 2021-04-08 VITALS — BP 110/82 | HR 66 | Ht 61.0 in | Wt 116.5 lb

## 2021-04-08 DIAGNOSIS — G4719 Other hypersomnia: Secondary | ICD-10-CM | POA: Diagnosis not present

## 2021-04-08 DIAGNOSIS — G478 Other sleep disorders: Secondary | ICD-10-CM | POA: Diagnosis not present

## 2021-04-08 DIAGNOSIS — G4752 REM sleep behavior disorder: Secondary | ICD-10-CM | POA: Diagnosis not present

## 2021-04-08 NOTE — Patient Instructions (Signed)
Melatonin 3 mg to 5 mg nightly

## 2021-04-08 NOTE — Progress Notes (Signed)
[facility]  PATIENT: Kristine [name] DOB: Apr 18, 1941  REFERRING DOCTOR OR PCP: Renaye RakersVeita Bland MD SOURCE: Patient, husband, notes from PCP  _________________________________   HISTORICAL  CHIEF COMPLAINT:  Chief Complaint  Patient presents with  . Follow-up    RM 12. Last seen 12/19/2020.     HISTORY OF PRESENT ILLNESS:  Kristine [name] is a 80 year old woman with sleep-talkingand active dreams x 1-2 yw\ears.    Update 04/08/2021: She is talking in her sleep nightly - with mumbling and sometimes formed conversation.  She moves her arms some.   She has some purposeful movements like reaching or clapping.  She is a retired Runner, broadcasting/film/videoteacher and sometimes acts like she is on a class.  She has not had any more episodes of yelling or thinking she is being attacked.  In general, she is doing better since starting the clonazepam..   She has yelled out in her sleep but this is not violent or defensive.   She has not gotten out of bed.   Episodes can occur any time after 1-2 hours of sleep.  They can occur during longer naps also. She started clonazepam 0.25 mg and is now on 0.5 mg.   She tolerates it well and it has reduced the intensity of the behavior with fewer movements though she continues to have sleep-talking.    She wakes up refreshed and has no side effects.     She snores at night and sometimes has blowing/puffing but no definite pauses or gasps.   She will sometimes be sleepy in the evening.  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  She has no tremors.  Gait is stable but worse than a couple years ago due to calluses on her feet and a total hip replacement.   The stride is mildly reduced and she has had a couple falls over the past 2 years.  She has HTN, CAD and hyperthyroidism.    POLYSOMNOGRAM 01/08/2021 1.  No significant OSA throughout most of the night (AHI = 1.4).  Mild REM associated OSA is noted (REM  AHI = 13.3) 2.  Sleep talking during REM Sleep 3.  Rapid sleep onset (0 minutes) with sleep efficiency = 64%.  No N3 sleep recorded    EPWORTH SLEEPINESS SCALE  On a scale of 0 - 3 what is the chance of dozing:  Sitting and Reading:   2 Watching TV:    3 Sitting inactive in a public place: 0 Passenger in car for one hour: 3 Lying down to rest in the afternoon: 3 Sitting and talking to someone: 1 Sitting quietly after lunch:  3 In a car, stopped in traffic:  0  Total (out of 24):   15/24   Moderate excessive daytime sleepiness.       REVIEW OF SYSTEMS: Constitutional: No fevers, chills, sweats, or change in appetite Eyes: No visual changes, double vision, eye pain Ear, nose and throat: No hearing loss, ear pain, nasal congestion, sore throat Cardiovascular: No chest pain, palpitations Respiratory: No shortness of breath at rest or with exertion.   No wheezes GastrointestinaI: No nausea, vomiting, diarrhea, abdominal pain, fecal incontinence Genitourinary: No dysuria, urinary retention or frequency.  No nocturia. Musculoskeletal: No neck pain, back pain Integumentary: No rash, pruritus, skin lesions Neurological: as above Psychiatric: No depression at this time.  No anxiety Endocrine: No palpitations, diaphoresis, change in appetite, change in weigh or  increased thirst Hematologic/Lymphatic: No anemia, purpura, petechiae. Allergic/Immunologic: No itchy/runny eyes, nasal congestion, recent allergic reactions, rashes  ALLERGIES: Allergies  Allergen Reactions  . Ace Inhibitors Swelling    Angioedema of the tongue  . Iodinated Diagnostic Agents Anaphylaxis  . Other Anaphylaxis    Allergic to melons, nuts  . Shellfish-Derived Products Anaphylaxis  . Hydrocodone Other (See Comments)    unspecified    HOME MEDICATIONS:  Current Outpatient Medications:  .  aspirin EC 81 MG tablet, Take 81 mg by mouth daily., Disp: , Rfl:  .  atorvastatin (LIPITOR) 80 MG tablet, Take  1 tablet (80 mg total) by mouth daily. Please make yearly appt with Dr. Clifton James for July 2022 for future refills. Thank you 1st attempt, Disp: 90 tablet, Rfl: 0 .  BREZTRI AEROSPHERE 160-9-4.8 MCG/ACT AERO, , Disp: , Rfl:  .  clonazePAM (KLONOPIN) 0.5 MG tablet, Take 1/2 to 1 pill qHS, Disp: 30 tablet, Rfl: 5 .  ezetimibe (ZETIA) 10 MG tablet, TAKE 1 TABLET(10 MG) BY MOUTH DAILY, Disp: 90 tablet, Rfl: 2 .  hydrALAZINE (APRESOLINE) 25 MG tablet, TAKE 1 TABLET(25 MG) BY MOUTH DAILY., Disp: 90 tablet, Rfl: 3 .  hydrochlorothiazide (HYDRODIURIL) 25 MG tablet, Take 1 tablet (25 mg total) by mouth daily., Disp: 90 tablet, Rfl: 3 .  methimazole (TAPAZOLE) 5 MG tablet, Take 5 mg by mouth daily., Disp: , Rfl:  .  metoprolol succinate (TOPROL-XL) 50 MG 24 hr tablet, Take 1 tablet (50 mg total) by mouth daily. Take with or immediately following a meal., Disp: 90 tablet, Rfl: 3  PAST MEDICAL HISTORY: Past Medical History:  Diagnosis Date  . COPD (chronic obstructive pulmonary disease) (HCC)   . Hard of hearing   . History of anemia   . History of blood transfusion   . History of pneumonia   . HTN (hypertension)   . Hyperlipidemia   . Hyperthyroidism    treated wtih radioactive iodine  . OA (osteoarthritis of spine)   . PVD (peripheral vascular disease) (HCC)    Lower extremity Dopplers  May 2revealed an ABI of 0.83 in the right posterior   . Shortness of breath dyspnea   . STEMI (ST elevation myocardial infarction) [facility]) May 2011   Promus DES in the proximal, mid and distal RCA in May 2011  . Stroke (HCC)   . Urinary frequency   . Urinary urgency     PAST SURGICAL HISTORY: Past Surgical History:  Procedure Laterality Date  . ABDOMINAL HYSTERECTOMY     Had ovarian resection and required lysis of adhesions  . CARDIAC CATHETERIZATION  04/07/2010   EF 60-70%  . CORONARY STENTS     x3  . ESOPHAGOGASTRODUODENOSCOPY N/A 03/10/2015   Procedure: ESOPHAGOGASTRODUODENOSCOPY (EGD);  Surgeon: Wandalee Ferdinand, MD;  Location: [facility] ENDOSCOPY;  Service: Endoscopy;  Laterality: N/A;  . OVARY SURGERY    . TOTAL HIP ARTHROPLASTY  2004   left  . TOTAL HIP ARTHROPLASTY Right 01/23/2016   Procedure: RIGHT TOTAL HIP ARTHROPLASTY ANTERIOR APPROACH;  Surgeon: Loreta Ave, MD;  Location[facility] OR;  Service: Orthopedics;  Laterality: Right;    FAMILY HISTORY: Family History  Problem Relation Age of Onset  . Peripheral vascular disease Mother   . Congestive Heart Failure Mother   . Heart attack Father     SOCIAL HISTORY:  Social History   Socioeconomic History  . Marital status: Married    Spouse name: Colen  . Number of children: 3  . Years of education:  Masters  . Highest education level: Not on file  Occupational History  . Occupation: Retired  Tobacco Use  . Smoking status: Former Smoker    Packs/day: 1.00    Years: 45.00    Pack years: 45.00    Types: Cigarettes    Quit date: 04/07/2010    Years since quitting: 11.0  . Smokeless tobacco: Never Used  Vaping Use  . Vaping Use: Never used  Substance and Sexual Activity  . Alcohol use: Yes    Comment: rarely   . Drug use: No  . Sexual activity: Not on file  Other Topics Concern  . Not on file  Social History Narrative   Lives with husband, daughter and her son   Right handed   Tea sometimes, decaf coffee   Social Determinants of Health   Financial Resource Strain: Not on file  Food Insecurity: Not on file  Transportation Needs: Not on file  Physical Activity: Not on file  Stress: Not on file  Social Connections: Not on file  Intimate Partner Violence: Not on file     PHYSICAL EXAM  Vitals:   04/08/21 1519  BP: 110/82  Pulse: 66  SpO2: 98%  Weight: 116 lb 8 oz (52.8 kg)  Height: 5\' 1"  (1.549 m)    Body mass index is 22.01 kg/m.   General: The patient is well-developed and well-nourished and in no acute distress  HEENT:  Head is [place]/AT.  Sclera are anicteric.  Skin: Extremities are without rash or   edema.  Neurologic Exam  Mental status: The patient is alert and oriented x 2 1/2 (off by one day) at the time of the examination. The patient has apparent normal recent and remote memory, with mildly reduced attention.   Speech is normal.  Cranial nerves: Extraocular movements are full.  Facial strength and sensation are normal.  No obvious hearing deficits are noted.  Motor:  Muscle bulk is normal.   Tone is slightly increased in the arms. Strength is  5 / 5 in all 4 extremities.   Sensory: Sensory testing is intact to pinprick, soft touch and vibration sensation in all 4 extremities.  Coordination: Cerebellar testing reveals good finger-nose-finger and heel-to-shin bilaterally.  Gait and station: Station is normal.  The stride is mildly reduced.  She turns 180 degrees in 5 steps, better than last visit..  She has retropulsion..  Romberg is negative.   Reflexes: Deep tendon reflexes are symmetric and normal bilaterally.   Plantar responses are flexor.    DIAGNOSTIC DATA (LABS, IMAGING, TESTING) - I reviewed patient records, labs, notes, testing and imaging myself where available.  Lab Results  Component Value Date   WBC 5.7 06/23/2020   HGB 13.3 06/23/2020   HCT 41.9 06/23/2020   MCV 92.1 06/23/2020   PLT 367 06/23/2020      Component Value Date/Time   NA 138 06/23/2020 2120   K 3.7 06/23/2020 2120   CL 106 06/23/2020 2120   CO2 22 06/23/2020 2120   GLUCOSE 136 (H) 06/23/2020 2120   BUN 17 06/23/2020 2120   CREATININE 0.72 06/23/2020 2120   CALCIUM 9.4 06/23/2020 2120   PROT 7.3 09/14/2019 1745   PROT 7.6 08/31/2019 0000   ALBUMIN 4.0 09/14/2019 1745   ALBUMIN 4.5 08/31/2019 0000   AST 37 09/14/2019 1745   ALT 24 09/14/2019 1745   ALKPHOS 74 09/14/2019 1745   BILITOT 1.1 09/14/2019 1745   BILITOT 0.9 08/31/2019 0000   GFRNONAA >60 06/23/2020 2120  GFRAA >60 06/23/2020 2120   Lab Results  Component Value Date   CHOL 155 08/31/2019   HDL 59 08/31/2019    LDLCALC 85 08/31/2019   LDLDIRECT 138.9 07/23/2011   TRIG 51 08/31/2019   CHOLHDL 2.6 08/31/2019   Lab Results  Component Value Date   HGBA1C  05/30/2010    5.5 (NOTE)                                                                       According to the ADA Clinical Practice Recommendations for 2011, when HbA1c is used as a screening test:   >=6.5%   Diagnostic of Diabetes Mellitus           (if abnormal result  is confirmed)  5.7-6.4%   Increased risk of developing Diabetes Mellitus  References:Diagnosis and Classification of Diabetes Mellitus,Diabetes Care,2011,34(Suppl 1):S62-S69 and Standards of Medical Care in         Diabetes - 2011,Diabetes Care,2011,34  (Suppl 1):S11-S61.   No results found for: VITAMINB12 Lab Results  Component Value Date   TSH 0.283 (L) 05/31/2015       ASSESSMENT AND PLAN  REM behavioral disorder  Somniloquy  Excessive daytime sleepiness  1.   She will continue clonazepam for the REM behavior disorder.  We discussed that there is an association of RBD with Parkinson's and Lewy body disease.  She has some gait disturbance though does not have evidence of Parkinson's.  She does not have cognitive issues so hopefully will not develop Lewy body disease.  Sleep disorder in Lewy body disease can proceed other symptoms by many years. 2.    She will return to see [place] in 6 months or sooner if there are new or worsening neurologic symptoms.  Follow-up can be as needed if primary care is able to write for the clonazepam.    Safiatou Islam A. Epimenio Foot, MD, [facility] 04/08/2021, 3:50 PM Certified in Neurology, Clinical Neurophysiology, Sleep Medicine and Neuroimaging  [facility] [address] [place], [place] [address] [phone]

## 2021-04-11 DIAGNOSIS — F0151 Vascular dementia with behavioral disturbance: Secondary | ICD-10-CM | POA: Diagnosis not present

## 2021-04-11 DIAGNOSIS — I639 Cerebral infarction, unspecified: Secondary | ICD-10-CM | POA: Diagnosis not present

## 2021-04-11 DIAGNOSIS — M130 Polyarthritis, unspecified: Secondary | ICD-10-CM | POA: Diagnosis not present

## 2021-04-11 DIAGNOSIS — I10 Essential (primary) hypertension: Secondary | ICD-10-CM | POA: Diagnosis not present

## 2021-04-11 DIAGNOSIS — E1169 Type 2 diabetes mellitus with other specified complication: Secondary | ICD-10-CM | POA: Diagnosis not present

## 2021-04-11 DIAGNOSIS — H908 Mixed conductive and sensorineural hearing loss, unspecified: Secondary | ICD-10-CM | POA: Diagnosis not present

## 2021-04-30 DIAGNOSIS — Z780 Asymptomatic menopausal state: Secondary | ICD-10-CM | POA: Diagnosis not present

## 2021-04-30 DIAGNOSIS — Z1231 Encounter for screening mammogram for malignant neoplasm of breast: Secondary | ICD-10-CM | POA: Diagnosis not present

## 2021-04-30 DIAGNOSIS — M8588 Other specified disorders of bone density and structure, other site: Secondary | ICD-10-CM | POA: Diagnosis not present

## 2021-05-07 ENCOUNTER — Other Ambulatory Visit: Payer: Self-pay

## 2021-05-07 MED ORDER — METOPROLOL SUCCINATE ER 50 MG PO TB24: 50.0000 mg | ORAL_TABLET | Freq: Every day | ORAL | 0 refills | Status: DC

## 2021-05-13 ENCOUNTER — Other Ambulatory Visit: Payer: Self-pay

## 2021-05-13 MED ORDER — HYDROCHLOROTHIAZIDE 25 MG PO TABS: 25.0000 mg | ORAL_TABLET | Freq: Every day | ORAL | 0 refills | Status: DC

## 2021-05-14 ENCOUNTER — Other Ambulatory Visit: Payer: Self-pay

## 2021-05-14 ENCOUNTER — Observation Stay (HOSPITAL_COMMUNITY)
Admission: EM | Admit: 2021-05-14 | Discharge: 2021-05-15 | Disposition: A | Discharge status: Home / Self Care | Payer: Medicare PPO | Attending: Family Medicine | Admitting: Family Medicine

## 2021-05-14 ENCOUNTER — Emergency Department (HOSPITAL_COMMUNITY): Payer: Medicare PPO

## 2021-05-14 ENCOUNTER — Encounter (HOSPITAL_COMMUNITY): Payer: Self-pay

## 2021-05-14 DIAGNOSIS — Z87891 Personal history of nicotine dependence: Secondary | ICD-10-CM | POA: Insufficient documentation

## 2021-05-14 DIAGNOSIS — Z7982 Long term (current) use of aspirin: Secondary | ICD-10-CM | POA: Insufficient documentation

## 2021-05-14 DIAGNOSIS — R0789 Other chest pain: Principal | ICD-10-CM | POA: Insufficient documentation

## 2021-05-14 DIAGNOSIS — J449 Chronic obstructive pulmonary disease, unspecified: Secondary | ICD-10-CM | POA: Diagnosis present

## 2021-05-14 DIAGNOSIS — Z79899 Other long term (current) drug therapy: Secondary | ICD-10-CM | POA: Diagnosis not present

## 2021-05-14 DIAGNOSIS — I2510 Atherosclerotic heart disease of native coronary artery without angina pectoris: Secondary | ICD-10-CM | POA: Diagnosis present

## 2021-05-14 DIAGNOSIS — Z96643 Presence of artificial hip joint, bilateral: Secondary | ICD-10-CM | POA: Diagnosis not present

## 2021-05-14 DIAGNOSIS — Z20822 Contact with and (suspected) exposure to covid-19: Secondary | ICD-10-CM | POA: Insufficient documentation

## 2021-05-14 DIAGNOSIS — I10 Essential (primary) hypertension: Secondary | ICD-10-CM | POA: Diagnosis not present

## 2021-05-14 DIAGNOSIS — E785 Hyperlipidemia, unspecified: Secondary | ICD-10-CM | POA: Diagnosis present

## 2021-05-14 DIAGNOSIS — R0602 Shortness of breath: Secondary | ICD-10-CM | POA: Diagnosis not present

## 2021-05-14 DIAGNOSIS — E039 Hypothyroidism, unspecified: Secondary | ICD-10-CM | POA: Diagnosis not present

## 2021-05-14 DIAGNOSIS — R079 Chest pain, unspecified: Secondary | ICD-10-CM | POA: Diagnosis present

## 2021-05-14 LAB — CBC WITH DIFFERENTIAL/PLATELET
Abs Immature Granulocytes: 0.01 10*3/uL (ref 0.00–0.07)
Basophils Absolute: 0.1 10*3/uL (ref 0.0–0.1)
Basophils Relative: 2 %
Eosinophils Absolute: 0.2 10*3/uL (ref 0.0–0.5)
Eosinophils Relative: 4 %
HCT: 38.7 % (ref 36.0–46.0)
Hemoglobin: 12.6 g/dL (ref 12.0–15.0)
Immature Granulocytes: 0 %
Lymphocytes Relative: 33 %
Lymphs Abs: 1.6 10*3/uL (ref 0.7–4.0)
MCH: 29.2 pg (ref 26.0–34.0)
MCHC: 32.6 g/dL (ref 30.0–36.0)
MCV: 89.8 fL (ref 80.0–100.0)
Monocytes Absolute: 0.4 10*3/uL (ref 0.1–1.0)
Monocytes Relative: 9 %
Neutro Abs: 2.5 10*3/uL (ref 1.7–7.7)
Neutrophils Relative %: 52 %
Platelets: 261 10*3/uL (ref 150–400)
RBC: 4.31 MIL/uL (ref 3.87–5.11)
RDW: 14.9 % (ref 11.5–15.5)
WBC: 4.8 10*3/uL (ref 4.0–10.5)
nRBC: 0 % (ref 0.0–0.2)

## 2021-05-14 LAB — COMPREHENSIVE METABOLIC PANEL
ALT: 17 U/L (ref 0–44)
AST: 26 U/L (ref 15–41)
Albumin: 3.5 g/dL (ref 3.5–5.0)
Alkaline Phosphatase: 76 U/L (ref 38–126)
Anion gap: 7 (ref 5–15)
BUN: 18 mg/dL (ref 8–23)
CO2: 29 mmol/L (ref 22–32)
Calcium: 9 mg/dL (ref 8.9–10.3)
Chloride: 102 mmol/L (ref 98–111)
Creatinine, Ser: 0.94 mg/dL (ref 0.44–1.00)
GFR, Estimated: >60 mL/min (ref >60)
Glucose, Bld: 114 mg/dL — ABNORMAL HIGH (ref 70–99)
Potassium: 3.2 mmol/L — ABNORMAL LOW (ref 3.5–5.1)
Sodium: 138 mmol/L (ref 135–145)
Total Bilirubin: 0.9 mg/dL (ref 0.3–1.2)
Total Protein: 7.1 g/dL (ref 6.5–8.1)

## 2021-05-14 LAB — RESP PANEL BY RT-PCR (FLU A&B, COVID) ARPGX2
Influenza A by PCR: NEGATIVE
Influenza B by PCR: NEGATIVE
SARS Coronavirus 2 by RT PCR: NEGATIVE

## 2021-05-14 LAB — TROPONIN I (HIGH SENSITIVITY): Troponin I (High Sensitivity): 4 ng/L (ref <18)

## 2021-05-14 MED ORDER — ACETAMINOPHEN 325 MG PO TABS
650.0000 mg | ORAL_TABLET | Freq: Once | ORAL | Status: AC
  Administered 2021-05-14: 650 mg via ORAL
  Filled 2021-05-14: qty 2

## 2021-05-14 NOTE — ED Notes (Signed)
Provider at bedside

## 2021-05-14 NOTE — ED Provider Notes (Signed)
[facility] EMERGENCY DEPARTMENT Provider Note   CSN: [id number] Arrival date & time: 05/14/21  2125     History Chief Complaint  Patient presents with  . Chest Pain    Kristine [name] is a 80 y.o. female presenting for evaluation of chest pain.  Patient states chest pain began acutely about an hour and half prior to arrival.  She was getting ready for bed at the time.  She took aspirin and nitro at home, pain has resolved.  She reports mild associated shortness of breath.  She states this feels similar to when she had a heart attack in 2011 with stents placed.  She follows with [facility] cardiology.  She denies recent fevers, chills, cough, nausea, vomiting, abdominal pain, urinary symptoms, abnormal bowel movements.  Per chart review, history of COPD, hypertension, hyperlipidemia, stroke.  Patient reports history of memory loss, and notes report dementia, although this is not listed on her dx list.  HPI     Past Medical History:  Diagnosis Date  . COPD (chronic obstructive pulmonary disease) (HCC)   . Hard of hearing   . History of anemia   . History of blood transfusion   . History of pneumonia   . HTN (hypertension)   . Hyperlipidemia   . Hyperthyroidism    treated wtih radioactive iodine  . OA (osteoarthritis of spine)   . PVD (peripheral vascular disease) (HCC)    Lower extremity Dopplers  May 2revealed an ABI of 0.83 in the right posterior   . Shortness of breath dyspnea   . STEMI (ST elevation myocardial infarction) [facility]) May 2011   Promus DES in the proximal, mid and distal RCA in May 2011  . Stroke (HCC)   . Urinary frequency   . Urinary urgency     Patient Active Problem List   Diagnosis Date Noted  . Chest pain 05/14/2021  . Somniloquy 04/08/2021  . 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 Snoring 12/19/2020  . Excessive daytime sleepiness 12/19/2020  . Gait disturbance 12/19/2020  . Pain due to onychomycosis of toenails of both feet  05/25/2019  . Porokeratosis 05/25/2019  . 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 Angioedema 05/31/2015  . Fever 03/11/2015  . Acute blood loss anemia 03/11/2015  . Melena 03/10/2015  . GI bleed 03/09/2015  . Peripheral arterial disease (HCC) 03/01/2012  . CAD (coronary artery disease) 01/20/2012  . HTN (hypertension)   . Hyperlipidemia   . PVD (peripheral vascular disease) (HCC)   . COPD (chronic obstructive pulmonary disease) (HCC)   . Hyperthyroidism   . OA (osteoarthritis of spine)   . STEMI (ST elevation myocardial infarction) (HCC) 04/07/2010    Past Surgical History:  Procedure Laterality Date  . ABDOMINAL HYSTERECTOMY     Had ovarian resection and required lysis of adhesions  . CARDIAC CATHETERIZATION  04/07/2010   EF 60-70%  . CORONARY STENTS     x3  . ESOPHAGOGASTRODUODENOSCOPY N/A 03/10/2015   Procedure: ESOPHAGOGASTRODUODENOSCOPY (EGD);  Surgeon: Wandalee Ferdinand, MD;  Location: [facility] ENDOSCOPY;  Service: Endoscopy;  Laterality: N/A;  . OVARY SURGERY    . TOTAL HIP ARTHROPLASTY  2004   left  . TOTAL HIP ARTHROPLASTY Right 01/23/2016   Procedure: RIGHT TOTAL HIP ARTHROPLASTY ANTERIOR APPROACH;  Surgeon: Loreta Ave, MD;  Location: [facility] OR;  Service: Orthopedics;  Laterality: Right;     OB History   No obstetric history on file.  Family History  Problem Relation Age of Onset  . Peripheral vascular disease Mother   . Congestive Heart Failure Mother   . Heart attack Father     Social History   Tobacco Use  . Smoking status: Former Smoker    Packs/day: 1.00    Years: 45.00    Pack years: 45.00    Types: Cigarettes    Quit date: 04/07/2010    Years since quitting: 11.1  . Smokeless tobacco: Never Used  Vaping Use  . Vaping Use: Never used  Substance Use Topics  . Alcohol use: Yes    Comment: rarely   . Drug use: No    Home Medications Prior to Admission medications   Medication Sig Start Date  End Date Taking? Authorizing Provider  aspirin EC 81 MG tablet Take 81 mg by mouth daily.    [provider]  atorvastatin (LIPITOR) 80 MG tablet Take 1 tablet (80 mg total) by mouth daily. Please make yearly appt with Dr. Clifton James for July 2022 for future refills. Thank you 1st attempt 03/29/21   Kathleene Hazel, MD  BREZTRI AEROSPHERE 160-9-4.8 MCG/ACT AERO  10/17/20   [provider]  clonazePAM Scarlette Calico) 0.5 MG tablet Take 1/2 to 1 pill qHS 12/19/20   Sater, Pearletha Furl, MD  ezetimibe (ZETIA) 10 MG tablet TAKE 1 TABLET(10 MG) BY MOUTH DAILY 10/18/20   Kathleene Hazel, MD  hydrALAZINE (APRESOLINE) 25 MG tablet TAKE 1 TABLET(25 MG) BY MOUTH DAILY. 07/11/20   Kathleene Hazel, MD  hydrochlorothiazide (HYDRODIURIL) 25 MG tablet Take 1 tablet (25 mg total) by mouth daily. Please make yearly appt with Dr. Clifton James for July 2022 before anymore refills. Thank you 1st attempt 05/13/21   Kathleene Hazel, MD  methimazole (TAPAZOLE) 5 MG tablet Take 5 mg by mouth daily.    [provider]  metoprolol succinate (TOPROL-XL) 50 MG 24 hr tablet Take 1 tablet (50 mg total) by mouth daily. Take with or immediately following a meal. Please make yearly appt with Dr. Clifton James for July 2022 before anymore refills. Thank you 1st attempt 05/07/21   Kathleene Hazel, MD    Allergies    Ace inhibitors, Iodinated diagnostic agents, Other, Shellfish-derived products, and Hydrocodone  Review of Systems   Review of Systems  Respiratory: Positive for shortness of breath.   Cardiovascular: Positive for chest pain (resolved).  All other systems reviewed and are negative.   Physical Exam Updated Vital Signs BP 100/62   Pulse 69   Temp 98.4 F (36.9 C) (Oral)   Resp 17   Ht 5\' 1"  (1.549 m)   Wt 51.7 kg   SpO2 95%   BMI 21.54 kg/m   Physical Exam Vitals and nursing note reviewed.  Constitutional:      General: She is not in acute distress.     Appearance: She is well-developed.     Comments: nontoxic  HENT:     Head: Normocephalic and atraumatic.  Eyes:     Conjunctiva/sclera: Conjunctivae normal.     Pupils: Pupils are equal, round, and reactive to light.  Cardiovascular:     Rate and Rhythm: Normal rate and regular rhythm.     Pulses: Normal pulses.  Pulmonary:     Effort: Pulmonary effort is normal. No respiratory distress.     Breath sounds: Normal breath sounds. No wheezing.  Abdominal:     General: There is no distension.     Palpations: Abdomen is soft. There is no  mass.     Tenderness: There is no abdominal tenderness. There is no guarding.  Musculoskeletal:        General: Normal range of motion.     Cervical back: Normal range of motion and neck supple.  Skin:    General: Skin is warm and dry.     Capillary Refill: Capillary refill takes less than 2 seconds.  Neurological:     Mental Status: She is alert and oriented to person, place, and time.     Comments: Occasional memory issues.      ED Results / Procedures / Treatments   Labs (all labs ordered are listed, but only abnormal results are displayed) Labs Reviewed  COMPREHENSIVE METABOLIC PANEL - Abnormal; Notable for the following components:      Result Value   Potassium 3.2 (*)    Glucose, Bld 114 (*)    All other components within normal limits  RESP PANEL BY RT-PCR (FLU A&B, COVID) ARPGX2  CBC WITH DIFFERENTIAL/PLATELET  TROPONIN I (HIGH SENSITIVITY)  TROPONIN I (HIGH SENSITIVITY)    EKG EKG Interpretation  Date/Time:  Tuesday May 14 2021 21:34:14 EDT Ventricular Rate:  77 PR Interval:  175 QRS Duration: 80 QT Interval:  374 QTC Calculation: 424 R Axis:   77 Text Interpretation: Sinus rhythm Probable left atrial enlargement Borderline T abnormalities, diffuse leads Confirmed by Marianna Fuss ([id number]) on 05/14/2021 9:48:58 PM   Radiology DG Chest 2 View  Result Date: 05/14/2021 CLINICAL DATA:  Chest pain. EXAM: CHEST - 2 VIEW  COMPARISON:  09/25/2017 FINDINGS: The cardiomediastinal contours are normal. Atherosclerosis of the thoracic aorta. The lungs are clear. Pulmonary vasculature is normal. No consolidation, pleural effusion, or pneumothorax. No acute osseous abnormalities are seen. IMPRESSION: No acute chest findings. Electronically Signed   By: Narda Rutherford M.D.   On: 05/14/2021 22:17    Procedures Procedures   Medications Ordered in ED Medications  acetaminophen (TYLENOL) tablet 650 mg (650 mg Oral Given 05/14/21 2250)    ED Course  I have reviewed the triage vital signs and the nursing notes.  Pertinent labs & imaging results that were available during my care of the patient were reviewed by me and considered in my medical decision making (see chart for details).    MDM Rules/Calculators/A&P                          Patient presented for evaluation of chest pain.  On exam, patient peers nontoxic.  Chest pain is resolved prior to arrival.  However patient does have concerning cardiac history.  Will obtain EKG, chest x-ray, labs.  EKG nonischemic.  Chest x-ray viewed and independently interpreted by me, no pneumonia, pnx, effusion.  Labs overall reassuring.  Initial troponin negative.  Will consult with cardiology.  I discussed with Dr. Welton Flakes from cardiology who recommends admission to medicine for trending troponins and echo tomorrow.  Discussed with Dr. Rachael Darby from [facility] hospitalist service, patient to be admitted  Final Clinical Impression(s) / ED Diagnoses Final diagnoses:  Chest pain, unspecified type    Rx / DC Orders ED Discharge Orders    None       Alveria Apley, PA-C 05/14/21 2333    Milagros Loll, MD 05/16/21 1800

## 2021-05-14 NOTE — H&P (Signed)
History and Physical    Kristine [name] HQI:[id number] DOB: 1941/06/23 DOA: 05/14/2021  PCP: Renaye Rakers, MD   Patient coming from:  Home  Chief Complaint: Chest pain  HPI: Kristine [name] is a 80 y.o. female with medical history significant for COPD, CAD, HTN, HLD, hyperthyroidism, MI in 2011 with stent placement. Presented with complaint of chest pain that started 1 1/2 hours before she came. She describes a pressure in her chest that was 9/10. It did not radiate but was associated with SOB and diaphoresis.  She reports that she took 2 nitroglycerin at home and her chest pain decreased to a 5 out of 10.  At that point her husband woke up from a nap and when he saw that she was short of breath and very sweaty he had her come to the emergency room.  She is currently chest pain-free.  She denies having any nausea or vomiting.  The pain felt very similar to when she had a heart attack in 2011 and at that time she had stents placed.  She is followed by [facility] cardiology.  She has not had any cough, fever, abdominal pain, urinary frequency or dysuria, syncope.  She states she has been compliant with her medications.  Has a history of smoking but quit in 2011.  Denies alcohol or illicit drug use  ED Course: Has been hemodynamically stable in the emergency room.  Labs reveal mildly decreased potassium at 3.2.  Remainder of electrolytes are normal.  Renal function is normal.  Initial troponin is 4.  CBC is unremarkable. Hospitalist service asked to evaluate and manage pt overnight for chest pain workup.   Review of Systems:  General: Denies fever, chills, weight loss, night sweats.  Denies dizziness. Denies change in appetite HENT: Denies head trauma, headache, denies change in hearing, tinnitus.  Denies nasal bleeding. Denies sore throat, sores in mouth. Denies difficulty swallowing Eyes: Denies blurry vision, pain in eye, drainage.  Denies discoloration of eyes. Neck: Denies pain.  Denies swelling.  Denies pain with movement. Cardiovascular: Reports chest pain. Denies palpitations.  Denies edema.  Denies orthopnea Respiratory: Reports shortness of breath. Denies cough. Denies wheezing. Denies sputum production Gastrointestinal: Denies abdominal pain, swelling. Denies nausea, vomiting, diarrhea. Denies melena.  Denies hematemesis. Musculoskeletal: Denies limitation of movement. Denies deformity or swelling. Denies arthralgias or myalgias. Genitourinary: Denies pelvic pain.  Denies urinary frequency or hesitancy.  Denies dysuria.  Skin: Denies rash.  Denies petechiae, purpura, ecchymosis. Neurological: Denies syncope. Denies seizure activity. Denies paresthesia. Denies slurred speech, drooping face.  Denies visual change. Psychiatric: Denies depression, anxiety. Denies hallucinations.  Past Medical History:  Diagnosis Date  . COPD (chronic obstructive pulmonary disease) (HCC)   . Hard of hearing   . History of anemia   . History of blood transfusion   . History of pneumonia   . HTN (hypertension)   . Hyperlipidemia   . Hyperthyroidism    treated wtih radioactive iodine  . OA (osteoarthritis of spine)   . PVD (peripheral vascular disease) (HCC)    Lower extremity Dopplers  May 2revealed an ABI of 0.83 in the right posterior   . Shortness of breath dyspnea   . STEMI (ST elevation myocardial infarction) [facility]) May 2011   Promus DES in the proximal, mid and distal RCA in May 2011  . Stroke (HCC)   . Urinary frequency   . Urinary urgency     Past Surgical History:  Procedure Laterality Date  . ABDOMINAL HYSTERECTOMY  Had ovarian resection and required lysis of adhesions  . CARDIAC CATHETERIZATION  04/07/2010   EF 60-70%  . CORONARY STENTS     x3  . ESOPHAGOGASTRODUODENOSCOPY N/A 03/10/2015   Procedure: ESOPHAGOGASTRODUODENOSCOPY (EGD);  Surgeon: Wandalee Ferdinand, MD;  Location: [facility] ENDOSCOPY;  Service: Endoscopy;  Laterality: N/A;  . OVARY SURGERY    . TOTAL HIP ARTHROPLASTY  2004    left  . TOTAL HIP ARTHROPLASTY Right 01/23/2016   Procedure: RIGHT TOTAL HIP ARTHROPLASTY ANTERIOR APPROACH;  Surgeon: Loreta Ave, MD;  Location: [facility] OR;  Service: Orthopedics;  Laterality: Right;    Social History  reports that she quit smoking about 11 years ago. Her smoking use included cigarettes. She has a 45.00 pack-year smoking history. She has never used smokeless tobacco. She reports current alcohol use. She reports that she does not use drugs.  Allergies  Allergen Reactions  . Ace Inhibitors Swelling    Angioedema of the tongue  . Iodinated Diagnostic Agents Anaphylaxis  . Other Anaphylaxis    Allergic to melons, nuts  . Shellfish-Derived Products Anaphylaxis  . Hydrocodone Other (See Comments)    unspecified    Family History  Problem Relation Age of Onset  . Peripheral vascular disease Mother   . Congestive Heart Failure Mother   . Heart attack Father      Prior to Admission medications   Medication Sig Start Date End Date Taking? Authorizing Provider  aspirin EC 81 MG tablet Take 81 mg by mouth daily.    [provider]  atorvastatin (LIPITOR) 80 MG tablet Take 1 tablet (80 mg total) by mouth daily. Please make yearly appt with Dr. Clifton James for July 2022 for future refills. Thank you 1st attempt 03/29/21   Kathleene Hazel, MD  BREZTRI AEROSPHERE 160-9-4.8 MCG/ACT AERO  10/17/20   [provider]  clonazePAM Scarlette Calico) 0.5 MG tablet Take 1/2 to 1 pill qHS 12/19/20   Sater, Pearletha Furl, MD  ezetimibe (ZETIA) 10 MG tablet TAKE 1 TABLET(10 MG) BY MOUTH DAILY 10/18/20   Kathleene Hazel, MD  hydrALAZINE (APRESOLINE) 25 MG tablet TAKE 1 TABLET(25 MG) BY MOUTH DAILY. 07/11/20   Kathleene Hazel, MD  hydrochlorothiazide (HYDRODIURIL) 25 MG tablet Take 1 tablet (25 mg total) by mouth daily. Please make yearly appt with Dr. Clifton James for July 2022 before anymore refills. Thank you 1st attempt 05/13/21   Kathleene Hazel, MD  methimazole  (TAPAZOLE) 5 MG tablet Take 5 mg by mouth daily.    [provider]  metoprolol succinate (TOPROL-XL) 50 MG 24 hr tablet Take 1 tablet (50 mg total) by mouth daily. Take with or immediately following a meal. Please make yearly appt with Dr. Clifton James for July 2022 before anymore refills. Thank you 1st attempt 05/07/21   Kathleene Hazel, MD    Physical Exam: Vitals:   05/14/21 2130 05/14/21 2134 05/14/21 2149 05/14/21 2230  BP:  113/67  100/62  Pulse:  76  69  Resp:  19  17  Temp:  98.4 F (36.9 C)    TempSrc:  Oral    SpO2: 98% 99%  95%  Weight:   51.7 kg   Height:   5\' 1"  (1.549 m)     Constitutional: NAD, calm, comfortable Vitals:   05/14/21 2130 05/14/21 2134 05/14/21 2149 05/14/21 2230  BP:  113/67  100/62  Pulse:  76  69  Resp:  19  17  Temp:  98.4 F (36.9 C)    TempSrc:  Oral    SpO2: 98% 99%  95%  Weight:   51.7 kg   Height:   5\' 1"  (1.549 m)    General: WDWN, Alert and oriented x3.  Eyes: EOMI, PERRL, conjunctivae normal. Sclera nonicteric HENT:  [place]/AT, external ears normal.  Nares patent without epistasis.  Mucous membranes are moist.  Neck: Soft, normal range of motion, supple, no masses, Trachea midline Respiratory: clear to auscultation bilaterally, no wheezing, no crackles. Normal respiratory effort. No accessory muscle use.  Cardiovascular: Regular rate and rhythm, no rubs / gallops. No extremity edema. 2+ pedal pulses. Abdomen: Soft, no tenderness, nondistended, no rebound or guarding. No masses palpated. No hepatosplenomegaly. Bowel sounds normoactive Musculoskeletal: FROM. no cyanosis. No joint deformity upper and lower extremities. no contractures. Normal muscle tone.  Skin: Warm, dry, intact no rashes, lesions, ulcers. No induration Neurologic: CN 2-12 grossly intact.  Normal speech.  Sensation intact to light touch, Strength 5/5 in all extremities.   Psychiatric: Normal judgment and insight.  Normal mood.    Labs on Admission: I have  personally reviewed following labs and imaging studies  CBC: Recent Labs  Lab 05/14/21 2143  WBC 4.8  NEUTROABS 2.5  HGB 12.6  HCT 38.7  MCV 89.8  PLT 261    Basic Metabolic Panel: Recent Labs  Lab 05/14/21 2143  NA 138  K 3.2*  CL 102  CO2 29  GLUCOSE 114*  BUN 18  CREATININE 0.94  CALCIUM 9.0    GFR: Estimated Creatinine Clearance: 37.8 mL/min (by C-G formula based on SCr of 0.94 mg/dL).  Liver Function Tests: Recent Labs  Lab 05/14/21 2143  AST 26  ALT 17  ALKPHOS 76  BILITOT 0.9  PROT 7.1  ALBUMIN 3.5    Urine analysis:    Component Value Date/Time   COLORURINE YELLOW 02/17/2018 1048   APPEARANCEUR CLEAR 02/17/2018 1048   LABSPEC 1.029 02/17/2018 1048   PHURINE 6.0 02/17/2018 1048   GLUCOSEU NEGATIVE 02/17/2018 1048   HGBUR NEGATIVE 02/17/2018 1048   BILIRUBINUR NEGATIVE 02/17/2018 1048   KETONESUR NEGATIVE 02/17/2018 1048   PROTEINUR NEGATIVE 02/17/2018 1048   UROBILINOGEN 0.2 03/09/2015 2216   NITRITE NEGATIVE 02/17/2018 1048   LEUKOCYTESUR NEGATIVE 02/17/2018 1048    Radiological Exams on Admission: DG Chest 2 View  Result Date: 05/14/2021 CLINICAL DATA:  Chest pain. EXAM: CHEST - 2 VIEW COMPARISON:  09/25/2017 FINDINGS: The cardiomediastinal contours are normal. Atherosclerosis of the thoracic aorta. The lungs are clear. Pulmonary vasculature is normal. No consolidation, pleural effusion, or pneumothorax. No acute osseous abnormalities are seen. IMPRESSION: No acute chest findings. Electronically Signed   By: 09/27/2017 M.D.   On: 05/14/2021 22:17    EKG: Independently reviewed.  EKG shows normal sinus rhythm with nonspecific ST changes but no acute ST elevation or depression.  QTc 424  Assessment/Plan Principal Problem:   Chest pain Patient replaced on cardiac telemetry for observation for chest pain.  Obtain serial troponin levels.  If troponins remain negative we will proceed with a gram in the morning.  Cardiology recommended to  the ER doctor that if chest pain recurred overnight patient will need stress test in the morning. If troponins become positive will consult cardiology to determine if left heart catheterization required. Antiplatelet therapy with aspirin daily.  Continue statin therapy.  Monitor blood pressure.   Nitroglycerin as needed.   Supplemental oxygen as needed to maintain O2 sat between 92-96%  Active Problems:   HTN (hypertension) Continue home medication of metoprolol  and HCTZ.  Monitor blood pressure     Hypokalemia Check Magnesium level and if low will replete.  Supplemental potassium provided.  Recheck electrolytes and renal function in am    Hyperlipidemia Continue Lipitor    COPD (chronic obstructive pulmonary disease) Continue Breztri which is her home medication.  Duoneb as needed for SOB, wheezing    CAD (coronary artery disease) Aspirin, beta blocker, statin.     DVT prophylaxis: Lovenox for DVT prophylaxis Code Status:   Full Code  Family Communication:  Diagnosis and plan discussed with patient and her husband who is at bedside.  Questions answered.  They verbalized understanding and agree with plan.  Further recommendations to follow as clinically indicated Disposition Plan:   Patient is from:  Home  Anticipated DC to:  Home  Anticipated DC date:  Anticipate 2 midnight or less stay in the hospital  Anticipated DC barriers: No barriers to discharge identified at this time  Consults called:  Cardiology was consulted by ER physician Admission status:  Observation  Claudean SeveranceBradley S Mance Vallejo MD [facility]  How to contact the [facility] Attending or Consulting provider 7A - 7P or covering provider during after hours 7P -7A, for this patient?   1. Check the care team in [facility] and look for a) attending/consulting [facility] provider listed and b) the [facility] team listed 2. Log into [email or web address] and use [place]'s universal password to access. If you do not have the password, please  contact the hospital operator. 3. Locate the [facility] provider you are looking for under [facility] and page to a number that you can be directly reached. 4. If you still have difficulty reaching the provider, please page the [facility] (Director on Call) for the Hospitalists listed on amion for assistance.  05/14/2021, 11:53 PM

## 2021-05-14 NOTE — ED Triage Notes (Signed)
Brought by [facility] EMS for CP that started about 1.5 hr ago. Took ASA 324mg  and 2 Nitro at home prior to EMS arrival with relief. Hx of MI 2011, CVA and stents placed.

## 2021-05-14 NOTE — ED Notes (Signed)
Patient transported to X-ray 

## 2021-05-14 NOTE — ED Notes (Signed)
Pt c/o a headache. Provider sent a message

## 2021-05-15 ENCOUNTER — Observation Stay (HOSPITAL_BASED_OUTPATIENT_CLINIC_OR_DEPARTMENT_OTHER): Payer: Medicare PPO

## 2021-05-15 DIAGNOSIS — R079 Chest pain, unspecified: Secondary | ICD-10-CM | POA: Diagnosis not present

## 2021-05-15 LAB — ECHOCARDIOGRAM COMPLETE
Area-P 1/2: 3.08 cm2
Height: 61 in
S' Lateral: 2.1 cm
Weight: 1824 oz

## 2021-05-15 LAB — CREATININE, SERUM
Creatinine, Ser: 0.81 mg/dL (ref 0.44–1.00)
GFR, Estimated: >60 mL/min (ref >60)

## 2021-05-15 LAB — TROPONIN I (HIGH SENSITIVITY)
Troponin I (High Sensitivity): 4 ng/L (ref <18)
Troponin I (High Sensitivity): 4 ng/L (ref <18)

## 2021-05-15 LAB — CBC
HCT: 37.5 % (ref 36.0–46.0)
Hemoglobin: 12.4 g/dL (ref 12.0–15.0)
MCH: 29.6 pg (ref 26.0–34.0)
MCHC: 33.1 g/dL (ref 30.0–36.0)
MCV: 89.5 fL (ref 80.0–100.0)
Platelets: 271 10*3/uL (ref 150–400)
RBC: 4.19 MIL/uL (ref 3.87–5.11)
RDW: 15 % (ref 11.5–15.5)
WBC: 5.6 10*3/uL (ref 4.0–10.5)
nRBC: 0 % (ref 0.0–0.2)

## 2021-05-15 LAB — MAGNESIUM: Magnesium: 1.8 mg/dL (ref 1.7–2.4)

## 2021-05-15 MED ORDER — PANTOPRAZOLE SODIUM 40 MG PO TBEC: 40.0000 mg | DELAYED_RELEASE_TABLET | Freq: Every day | ORAL | 1 refills | Status: DC

## 2021-05-15 MED ORDER — ONDANSETRON HCL 4 MG/2ML IJ SOLN: 4.0000 mg | Freq: Four times a day (QID) | INTRAMUSCULAR | Status: DC | PRN

## 2021-05-15 MED ORDER — BUDESON-GLYCOPYRROL-FORMOTEROL 160-9-4.8 MCG/ACT IN AERO: 160.0000 ug | INHALATION_SPRAY | Freq: Every day | RESPIRATORY_TRACT | Status: DC

## 2021-05-15 MED ORDER — ACETAMINOPHEN 325 MG PO TABS: 650.0000 mg | ORAL_TABLET | ORAL | Status: DC | PRN

## 2021-05-15 MED ORDER — ASPIRIN EC 81 MG PO TBEC
81.0000 mg | DELAYED_RELEASE_TABLET | Freq: Every day | ORAL | Status: DC
  Administered 2021-05-15: 81 mg via ORAL
  Filled 2021-05-15: qty 1

## 2021-05-15 MED ORDER — POTASSIUM CHLORIDE CRYS ER 20 MEQ PO TBCR
40.0000 meq | EXTENDED_RELEASE_TABLET | Freq: Once | ORAL | Status: AC
  Administered 2021-05-15: 40 meq via ORAL
  Filled 2021-05-15: qty 2

## 2021-05-15 MED ORDER — METOPROLOL SUCCINATE ER 25 MG PO TB24
50.0000 mg | ORAL_TABLET | Freq: Every day | ORAL | Status: DC
  Administered 2021-05-15: 50 mg via ORAL
  Filled 2021-05-15: qty 2

## 2021-05-15 MED ORDER — UMECLIDINIUM BROMIDE 62.5 MCG/INH IN AEPB
1.0000 | INHALATION_SPRAY | Freq: Every day | RESPIRATORY_TRACT | Status: DC
  Administered 2021-05-15: 1 via RESPIRATORY_TRACT
  Filled 2021-05-15: qty 7

## 2021-05-15 MED ORDER — IPRATROPIUM-ALBUTEROL 0.5-2.5 (3) MG/3ML IN SOLN: 3.0000 mL | RESPIRATORY_TRACT | Status: DC | PRN

## 2021-05-15 MED ORDER — FLUTICASONE FUROATE-VILANTEROL 200-25 MCG/INH IN AEPB
1.0000 | INHALATION_SPRAY | Freq: Every day | RESPIRATORY_TRACT | Status: DC
  Administered 2021-05-15: 1 via RESPIRATORY_TRACT
  Filled 2021-05-15: qty 28

## 2021-05-15 MED ORDER — ENOXAPARIN SODIUM 40 MG/0.4ML IJ SOSY
40.0000 mg | PREFILLED_SYRINGE | INTRAMUSCULAR | Status: DC
  Filled 2021-05-15: qty 0.4

## 2021-05-15 MED ORDER — HYDROCHLOROTHIAZIDE 25 MG PO TABS
25.0000 mg | ORAL_TABLET | Freq: Every day | ORAL | Status: DC
  Administered 2021-05-15: 25 mg via ORAL
  Filled 2021-05-15: qty 1

## 2021-05-15 MED ORDER — ATORVASTATIN CALCIUM 40 MG PO TABS
80.0000 mg | ORAL_TABLET | Freq: Every day | ORAL | Status: DC
  Administered 2021-05-15: 80 mg via ORAL
  Filled 2021-05-15: qty 2

## 2021-05-15 MED ORDER — EZETIMIBE 10 MG PO TABS
10.0000 mg | ORAL_TABLET | Freq: Every day | ORAL | Status: DC
  Administered 2021-05-15: 10 mg via ORAL
  Filled 2021-05-15: qty 1

## 2021-05-15 NOTE — ED Notes (Signed)
Called lab for add on of magnesium.

## 2021-05-15 NOTE — ED Notes (Signed)
Floor did not take report d/t possible hold on bed.

## 2021-05-15 NOTE — Discharge Summary (Signed)
Physician Discharge Summary  Kristine [name] JYN:[id number] DOB: 1941/02/07 DOA: 05/14/2021  PCP: Renaye Rakers, MD  Admit date: 05/14/2021 Discharge date: 05/15/2021  Time spent: 37 minutes  Recommendations for Outpatient Follow-up:  1. Please follow-up for CBC Chem-12 in about 1 week 2. New medication this hospital admission Protonix  Discharge Diagnoses:  MAIN problem for hospitalization   noncardiac chest pain  Please see below for itemized issues addressed in HOpsital- refer to other progress notes for clarity if needed  Discharge Condition: Improved  Diet recommendation: Heart healthy  Filed Weights   05/14/21 2149  Weight: 51.7 kg    History of present illness:   80 year old black female Parasomnias/REM behavior disorder/Parkinson's?  Followed by Dr. Epimenio Foot neurology-?  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 Park Breed from cardiology recommended admission to medicine and trending troponin  Potassium 3.2 and BUN/creatinine 18/0.9-->0.8 Troponin trend 4-->4  EKG over read = PR interval 0.08 QRS axis 80 degrees Low voltage complexes across precordium No overt ST-T wave changes  I subsequently examined and interviewed the patient in the presence of her husband-she has some cognitive deficits and is able to tell me her history but becomes quite tangential and cannot give a clear description as to where her pain is She tells me that when she bends over she also has some pain in her abdomen and in her chest On pressure over her anterior chest wall she does not have any reproducible pain She has not had any further pain in the ED since admission after she took nitro by EMS when she came into the hospital  We repeated  a troponin this morning of discharge and repeated her EKG-they are both unremarkable and my over read of the EKG shows no change from the one done last night  I discussed her case with Dr. Peter [place]az[place] of cardiology who agrees with my assessment that this is very unlikely to be cardiac in nature  I did discuss with the patient's husband that this possibly could be reflux related and I do not think that an alternative diagnosis such as PE is a possibility given the fact that her Wells score is low and her heart rate is in the 70s  I feel she has been optimized for discharge today, we will discontinue her order for echocardiogram and she can discharge home for close follow-up with Dr. Parke Simmers and with Dr. Clifton James her usual cardiologist   Discharge Exam: Vitals:   05/15/21 0615 05/15/21 0750  BP: 107/67 128/79  Pulse: 67 63  Resp: 13 16  Temp:  98.4 F (36.9 C)  SpO2: 93% 99%    Subj on day of d/c   Awake coherent somewhat tangential  General Exam on discharge  EOMI NCAT no focal deficit no icterus no pallor Chest is clear no added sound no rales no rhonchi S1-S2 no murmur Sinus on monitors Abdomen soft no rebound no guarding No lower extremity edema ROM intact Power 5/5 Psych euthymic however slightly confused and tangential at times  Discharge Instructions   Discharge Instructions    Diet - low sodium heart healthy   Complete by: As directed    Discharge instructions   Complete by: As directed    We do not think you  had cardiac chest pain-I think that some of the pain in your belly and in your chest was because of may be some reflux-I discussed your case with a cardiologist here who agrees with my assessment-I would recommend you follow-up with your regular physician to discuss alternative types of causes for this pain that you are having I would recommend you follow-up with your neurologist for further medication management I have started you on Protonix which you should  take daily as you told me that sometimes when you bend down this causes abdominal and chest pain Please ensure that you keep the follow-up with your primary care physician and your specialist If you have further chest pain that occurs, please discuss this with your primary care physician and seek medical attention   Increase activity slowly   Complete by: As directed      Allergies as of 05/15/2021      Reactions   Ace Inhibitors Swelling   Angioedema of the tongue   Iodinated Diagnostic Agents Anaphylaxis   Other Anaphylaxis   Allergic to melons, nuts   Shellfish-derived Products Anaphylaxis   Hydrocodone Other (See Comments)   unspecified      Medication List    TAKE these medications   aspirin EC 81 MG tablet Take 81 mg by mouth daily.   atorvastatin 80 MG tablet Commonly known as: LIPITOR Take 1 tablet (80 mg total) by mouth daily. Please make yearly appt with Dr. Clifton James for July 2022 for future refills. Thank you 1st attempt What changed: additional instructions   Breztri Aerosphere 160-9-4.8 MCG/ACT Aero Generic drug: Budeson-Glycopyrrol-Formoterol Inhale 2 puffs into the lungs 2 (two) times daily.   clonazePAM 0.5 MG tablet Commonly known as: KLONOPIN Take 1/2 to 1 pill qHS What changed:   how much to take  how to take this  when to take this  additional instructions   ezetimibe 10 MG tablet Commonly known as: ZETIA TAKE 1 TABLET(10 MG) BY MOUTH DAILY What changed: See the new instructions.   hydrALAZINE 25 MG tablet Commonly known as: APRESOLINE TAKE 1 TABLET(25 MG) BY MOUTH DAILY. What changed:   how much to take  how to take this  when to take this   hydrochlorothiazide 25 MG tablet Commonly known as: HYDRODIURIL Take 1 tablet (25 mg total) by mouth daily. Please make yearly appt with Dr. Clifton James for July 2022 before anymore refills. Thank you 1st attempt   melatonin 3 MG Tabs tablet Take 3 mg by mouth at bedtime as needed (sleep).    methimazole 5 MG tablet Commonly known as: TAPAZOLE Take 5 mg by mouth daily.   metoprolol succinate 50 MG 24 hr tablet Commonly known as: TOPROL-XL Take 1 tablet (50 mg total) by mouth daily. Take with or immediately following a meal. Please make yearly appt with Dr. Clifton James for July 2022 before anymore refills. Thank you 1st attempt   nitroGLYCERIN 0.3 MG SL tablet Commonly known as: NITROSTAT Place 0.3 mg under the tongue every 5 (five) minutes as needed for chest pain.   pantoprazole 40 MG tablet Commonly known as: Protonix Take 1 tablet (40 mg total) by mouth daily.      Allergies  Allergen Reactions  . Ace Inhibitors Swelling    Angioedema of the tongue  . Iodinated Diagnostic Agents Anaphylaxis  . Other Anaphylaxis    Allergic to melons, nuts  . Shellfish-Derived Products Anaphylaxis  . Hydrocodone Other (See Comments)    unspecified      The  results of significant diagnostics from this hospitalization (including imaging, microbiology, ancillary and laboratory) are listed below for reference.    Significant Diagnostic Studies: DG Chest 2 View  Result Date: 05/14/2021 CLINICAL DATA:  Chest pain. EXAM: CHEST - 2 VIEW COMPARISON:  09/25/2017 FINDINGS: The cardiomediastinal contours are normal. Atherosclerosis of the thoracic aorta. The lungs are clear. Pulmonary vasculature is normal. No consolidation, pleural effusion, or pneumothorax. No acute osseous abnormalities are seen. IMPRESSION: No acute chest findings. Electronically Signed   By: Narda Rutherford M.D.   On: 05/14/2021 22:17    Microbiology: Recent Results (from the past 240 hour(s))  Resp Panel by RT-PCR (Flu A&B, Covid) Nasopharyngeal Swab     Status: None   Collection Time: 05/14/21  9:47 PM   Specimen: Nasopharyngeal Swab; Nasopharyngeal(NP) swabs in vial transport medium  Result Value Ref Range Status   SARS Coronavirus 2 by RT PCR NEGATIVE NEGATIVE Final    Comment: (NOTE) SARS-CoV-2 target  nucleic acids are NOT DETECTED.  The SARS-CoV-2 RNA is generally detectable in upper respiratory specimens during the acute phase of infection. The lowest concentration of SARS-CoV-2 viral copies this assay can detect is 138 copies/mL. A negative result does not preclude SARS-Cov-2 infection and should not be used as the sole basis for treatment or other patient management decisions. A negative result may occur with  improper specimen collection/handling, submission of specimen other than nasopharyngeal swab, presence of viral mutation(s) within the areas targeted by this assay, and inadequate number of viral copies(<138 copies/mL). A negative result must be combined with clinical observations, patient history, and epidemiological information. The expected result is Negative.  Fact Sheet for Patients:  BloggerCourse.com  Fact Sheet for Healthcare Providers:  SeriousBroker.it  This test is no t yet approved or cleared by the [place] FDA and  has been authorized for detection and/or diagnosis of SARS-CoV-2 by FDA under an Emergency Use Authorization (EUA). This EUA will remain  in effect (meaning this test can be used) for the duration of the COVID-19 declaration under Section 564(b)(1) of the Act, 21 U.S.C.section 360bbb-3(b)(1), unless the authorization is terminated  or revoked sooner.       Influenza A by PCR NEGATIVE NEGATIVE Final   Influenza B by PCR NEGATIVE NEGATIVE Final    Comment: (NOTE) The Xpert Xpress SARS-CoV-2/FLU/RSV plus assay is intended as an aid in the diagnosis of influenza from Nasopharyngeal swab specimens and should not be used as a sole basis for treatment. Nasal washings and aspirates are unacceptable for Xpert Xpress SARS-CoV-2/FLU/RSV testing.  Fact Sheet for Patients: BloggerCourse.com  Fact Sheet for Healthcare  Providers: SeriousBroker.it  This test is not yet approved or cleared by the [place] FDA and has been authorized for detection and/or diagnosis of SARS-CoV-2 by FDA under an Emergency Use Authorization (EUA). This EUA will remain in effect (meaning this test can be used) for the duration of the COVID-19 declaration under Section 564(b)(1) of the Act, 21 U.S.C. section 360bbb-3(b)(1), unless the authorization is terminated or revoked.  Performed at [facility], [address], [place], [place] [address]      Labs: Basic Metabolic Panel: Recent Labs  Lab 05/14/21 2143 05/14/21 2359 05/15/21 0331  NA 138  --   --   K 3.2*  --   --   CL 102  --   --   CO2 29  --   --   GLUCOSE 114*  --   --   BUN 18  --   --  CREATININE 0.94  --  0.81  CALCIUM 9.0  --   --   MG  --  1.8  --    Liver Function Tests: Recent Labs  Lab 05/14/21 2143  AST 26  ALT 17  ALKPHOS 76  BILITOT 0.9  PROT 7.1  ALBUMIN 3.5   No results for input(s): LIPASE, AMYLASE in the last 168 hours. No results for input(s): AMMONIA in the last 168 hours. CBC: Recent Labs  Lab 05/14/21 2143 05/15/21 0331  WBC 4.8 5.6  NEUTROABS 2.5  --   HGB 12.6 12.4  HCT 38.7 37.5  MCV 89.8 89.5  PLT 261 271   Cardiac Enzymes: No results for input(s): CKTOTAL, CKMB, CKMBINDEX, TROPONINI in the last 168 hours. BNP: BNP (last 3 results) No results for input(s): BNP in the last 8760 hours.  ProBNP (last 3 results) No results for input(s): PROBNP in the last 8760 hours.  CBG: No results for input(s): GLUCAP in the last 168 hours.     Signed:  Rhetta MuraJai-Gurmukh Sigismund Cross MD   [facility] 05/15/2021, 9:30 AM

## 2021-05-15 NOTE — Hospital Course (Addendum)
80 year old black female Parasomnias/REM behavior disorder/Parkinson's?  Followed by Dr. Epimenio Foot neurology-?  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 Park Breed from cardiology recommended admission to medicine and trending troponin  Potassium 3.2 and BUN/creatinine 18/0.9-->0.8 Troponin trend 4-->4  EKG over read = PR interval 0.08 QRS axis 80 degrees Low voltage complexes across precordium No overt ST-T wave changes

## 2021-05-15 NOTE — ED Notes (Signed)
Patient to be discharged per Dr Alta Corning attending.

## 2021-05-15 NOTE — ED Notes (Signed)
ECHO at bedside.

## 2021-05-15 NOTE — Progress Notes (Signed)
  Echocardiogram 2D Echocardiogram has been performed.  Kristine [name] G Kristine [name] 05/15/2021, 10:11 AM

## 2021-05-15 NOTE — ED Notes (Signed)
Breakfast order placed ?

## 2021-05-15 NOTE — ED Notes (Signed)
A/AO, denies pain/SOB.

## 2021-05-15 NOTE — ED Notes (Addendum)
Per Dois Davenport, RN- Attending MD in ED and pt to be discharged from ED.  Pt placement notified.

## 2021-05-15 NOTE — ED Notes (Signed)
Attending provider at bedside

## 2021-05-16 DIAGNOSIS — Z1211 Encounter for screening for malignant neoplasm of colon: Secondary | ICD-10-CM | POA: Diagnosis not present

## 2021-05-16 DIAGNOSIS — Z1212 Encounter for screening for malignant neoplasm of rectum: Secondary | ICD-10-CM | POA: Diagnosis not present

## 2021-05-17 DIAGNOSIS — K2100 Gastro-esophageal reflux disease with esophagitis, without bleeding: Secondary | ICD-10-CM | POA: Diagnosis not present

## 2021-05-17 DIAGNOSIS — E876 Hypokalemia: Secondary | ICD-10-CM | POA: Diagnosis not present

## 2021-05-22 ENCOUNTER — Other Ambulatory Visit: Payer: Self-pay

## 2021-05-22 ENCOUNTER — Ambulatory Visit (INDEPENDENT_AMBULATORY_CARE_PROVIDER_SITE_OTHER): Payer: Medicare PPO | Admitting: Podiatry

## 2021-05-22 ENCOUNTER — Encounter: Payer: Self-pay | Admitting: Podiatry

## 2021-05-22 DIAGNOSIS — Q828 Other specified congenital malformations of skin: Secondary | ICD-10-CM | POA: Diagnosis not present

## 2021-05-22 DIAGNOSIS — I739 Peripheral vascular disease, unspecified: Secondary | ICD-10-CM

## 2021-05-22 NOTE — Progress Notes (Signed)
This patient returns to my office for at risk foot care.  This patient requires this care by a professional since this patient will be at risk due to having PAD, PVD.  This patient has painful callus on her left forefoot.  This callus is painful walking and wearing her shoes.  This patient presents for at risk foot care today.  General Appearance  Alert, conversant and in no acute stress.  Vascular  Dorsalis pedis and posterior tibial  pulses are weakly  palpable  bilaterally.  Capillary return is within normal limits  bilaterally. Temperature is within normal limits  bilaterally.  Neurologic  Senn-Weinstein monofilament wire test within normal limits  bilaterally. Muscle power within normal limits bilaterally.  Nails Thick disfigured discolored nails with subungual debris  from hallux to fifth toes bilaterally. No evidence of bacterial infection or drainage bilaterally.  Orthopedic  No limitations of motion  feet .  No crepitus or effusions noted.  No bony pathology or digital deformities noted.  Skin  normotropic skin noted bilaterally.  No signs of infections or ulcers noted.   Porokeratosis sub 3 left foot.    Porokeratosis sub 3 left foot.   Consent was obtained for treatment procedures.   Debridement of porokeratosis with # 15 blade.   Return office visit    9 weeks                  Told patient to return for periodic foot care and evaluation due to potential at risk complications.   Helane Gunther DPM

## 2021-06-06 ENCOUNTER — Other Ambulatory Visit: Payer: Self-pay

## 2021-06-06 ENCOUNTER — Ambulatory Visit: Payer: Medicare PPO | Admitting: Family

## 2021-06-06 ENCOUNTER — Encounter: Payer: Self-pay | Admitting: Family

## 2021-06-06 VITALS — BP 100/70 | HR 85 | Ht 62.0 in | Wt 106.6 lb

## 2021-06-06 DIAGNOSIS — I10 Essential (primary) hypertension: Secondary | ICD-10-CM

## 2021-06-06 DIAGNOSIS — I739 Peripheral vascular disease, unspecified: Secondary | ICD-10-CM | POA: Diagnosis not present

## 2021-06-06 DIAGNOSIS — E782 Mixed hyperlipidemia: Secondary | ICD-10-CM

## 2021-06-06 DIAGNOSIS — I25118 Atherosclerotic heart disease of native coronary artery with other forms of angina pectoris: Secondary | ICD-10-CM

## 2021-06-06 DIAGNOSIS — K219 Gastro-esophageal reflux disease without esophagitis: Secondary | ICD-10-CM

## 2021-06-06 MED ORDER — EZETIMIBE 10 MG PO TABS: 10.0000 mg | ORAL_TABLET | Freq: Every day | ORAL | 2 refills | Status: DC

## 2021-06-06 MED ORDER — HYDROCHLOROTHIAZIDE 25 MG PO TABS: 25.0000 mg | ORAL_TABLET | Freq: Every day | ORAL | 0 refills | Status: DC

## 2021-06-06 MED ORDER — HYDRALAZINE HCL 25 MG PO TABS: ORAL_TABLET | ORAL | 3 refills | Status: DC

## 2021-06-06 MED ORDER — ATORVASTATIN CALCIUM 80 MG PO TABS: 80.0000 mg | ORAL_TABLET | Freq: Every day | ORAL | 0 refills | Status: DC

## 2021-06-06 MED ORDER — METOPROLOL SUCCINATE ER 50 MG PO TB24: 50.0000 mg | ORAL_TABLET | Freq: Every day | ORAL | 2 refills | Status: DC

## 2021-06-06 NOTE — Patient Instructions (Addendum)
Medication Instructions:  Continue your current medications.   You may try Pepto Bismol for diarrhea or continue to use Immodium.   If you have indigestion you can use over the counter Omeprazole (Prilosec) or Famotidine (Pepcid).   *If you need a refill on your cardiac medications before your next appointment, please call your pharmacy*   Lab Work: None ordered today.   Testing/Procedures: None ordered today.   Follow-Up: At [facility], you and your health needs are our priority.  As part of our continuing mission to provide you with exceptional heart care, we have created designated Provider Care Teams.  These Care Teams include your primary Cardiologist (physician) and Advanced Practice Providers (APPs -  Physician Assistants and Nurse Practitioners) who all work together to provide you with the care you need, when you need it.  We recommend signing up for the patient portal called "MyChart".  Sign up information is provided on this After Visit Summary.  MyChart is used to connect with patients for Virtual Visits (Telemedicine).  Patients are able to view lab/test results, encounter notes, upcoming appointments, etc.  Non-urgent messages can be sent to your provider as well.   To learn more about what you can do with MyChart, go to [email or web address].    Your next appointment:   6 month(s)  The format for your next appointment:   In Person  Provider:   You may see Verne Carrow, MD or one of the following Advanced Practice Providers on your designated Care Team:   Ronie Spies, PA-C Jacolyn Reedy, PA-C   Other Instructions  Diarrhea, Adult Diarrhea is when you pass loose and watery poop (stool) often. Diarrhea can make you feel weak and cause you to lose water in your body (get dehydrated). Losing water in your body can cause you to: Feel tired and thirsty. Have a dry mouth. Go pee (urinate) less often. Diarrhea often lasts 2-3 days. However, it can last  longer if it is a sign of something more serious. It is important to treat your diarrhea as told by yourdoctor. Follow these instructions at home: Eating and drinking     Follow these instructions as told by your doctor: Take an ORS (oral rehydration solution). This is a drink that helps you replace fluids and minerals your body lost. It is sold at pharmacies and stores. Drink plenty of fluids, such as: Water. Ice chips. Diluted fruit juice. Low-calorie sports drinks. Milk, if you want. Avoid drinking fluids that have a lot of sugar or caffeine in them. Eat bland, easy-to-digest foods in small amounts as you are able. These foods include: Bananas. Applesauce. Rice. Low-fat (lean) meats. Toast. Crackers. Avoid alcohol. Avoid spicy or fatty foods.  Medicines Take over-the-counter and prescription medicines only as told by your doctor. If you were prescribed an antibiotic medicine, take it as told by your doctor. Do not stop using the antibiotic even if you start to feel better. General instructions  Wash your hands often using soap and water. If soap and water are not available, use a hand sanitizer. Others in your home should wash their hands as well. Hands should be washed: After using the toilet or changing a diaper. Before preparing, cooking, or serving food. While caring for a sick person. While visiting someone in a hospital. Drink enough fluid to keep your pee (urine) pale yellow. Rest at home while you get better. Watch your condition for any changes. Take a warm bath to help with any burning or pain from  having diarrhea. Keep all follow-up visits as told by your doctor. This is important.  Contact a doctor if: You have a fever. Your diarrhea gets worse. You have new symptoms. You cannot keep fluids down. You feel light-headed or dizzy. You have a headache. You have muscle cramps. Get help right away if: You have chest pain. You feel very weak or you pass out  (faint). You have bloody or black poop or poop that looks like tar. You have very bad pain, cramping, or bloating in your belly (abdomen). You have trouble breathing or you are breathing very quickly. Your heart is beating very quickly. Your skin feels cold and clammy. You feel confused. You have signs of losing too much water in your body, such as: Dark pee, very little pee, or no pee. Cracked lips. Dry mouth. Sunken eyes. Sleepiness. Weakness. Summary Diarrhea is when you pass loose and watery poop (stool) often. Diarrhea can make you feel weak and cause you to lose water in your body (get dehydrated). Take an ORS (oral rehydration solution). This is a drink that is sold at pharmacies and stores. Eat bland, easy-to-digest foods in small amounts as you are able. Contact a doctor if your condition gets worse. Get help right away if you have signs that you have lost too much water in your body. This information is not intended to replace advice given to you by your health care provider. Make sure you discuss any questions you have with your healthcare provider. Document Revised: 04/30/2018 Document Reviewed: 04/30/2018 Elsevier Patient Education  2022 [employer].

## 2021-06-06 NOTE — Progress Notes (Signed)
Office Visit    Patient Name: Kristine [name] Date of Encounter: 06/06/2021  PCP:  Renaye Rakers, MD   [facility]  Cardiologist:  Verne Carrow, MD  Advanced Practice Provider:  No care team member to display Electrophysiologist:  None   Chief Complaint    Kristine [name] is a 80 y.o. female with a hx of CAD s/p inferior STEMI May 2011 with 3 DES in the proximal, mid and distal RCA, hypertension, dementia, hyperlipidemia, PAD, COPD presents today for chest pain.   Past Medical History    Past Medical History:  Diagnosis Date   COPD (chronic obstructive pulmonary disease) (HCC)    Hard of hearing    History of anemia    History of blood transfusion    History of pneumonia    HTN (hypertension)    Hyperlipidemia    Hyperthyroidism    treated wtih radioactive iodine   OA (osteoarthritis of spine)    PVD (peripheral vascular disease) (HCC)    Lower extremity Dopplers  May 2revealed an ABI of 0.83 in the right posterior    Shortness of breath dyspnea    STEMI (ST elevation myocardial infarction) [facility]) May 2011   Promus DES in the proximal, mid and distal RCA in May 2011   Stroke [facility])    Urinary frequency    Urinary urgency    Past Surgical History:  Procedure Laterality Date   ABDOMINAL HYSTERECTOMY     Had ovarian resection and required lysis of adhesions   CARDIAC CATHETERIZATION  04/07/2010   EF 60-70%   CORONARY STENTS     x3   ESOPHAGOGASTRODUODENOSCOPY N/A 03/10/2015   Procedure: ESOPHAGOGASTRODUODENOSCOPY (EGD);  Surgeon: Wandalee Ferdinand, MD;  Location: [facility] ENDOSCOPY;  Service: Endoscopy;  Laterality: N/A;   OVARY SURGERY     TOTAL HIP ARTHROPLASTY  2004   left   TOTAL HIP ARTHROPLASTY Right 01/23/2016   Procedure: RIGHT TOTAL HIP ARTHROPLASTY ANTERIOR APPROACH;  Surgeon: Loreta Ave, MD;  Location: [facility];  Service: Orthopedics;  Laterality: Right;    Allergies  Allergies  Allergen Reactions   Ace Inhibitors Swelling     Angioedema of the tongue   Iodinated Diagnostic Agents Anaphylaxis   Other Anaphylaxis    Allergic to melons, nuts   Shellfish-Derived Products Anaphylaxis   Hydrocodone Other (See Comments)    unspecified    History of Present Illness    Kristine [name] is a 80 y.o. female with a hx of CAD s/p inferior STEMI May 2011 with 3 DES in the proximal, mid and distal RCA, hypertension, dementia, hyperlipidemia, PAD, COPD last seen 07/03/2020 by Herma Carson, PA.  She followed in the past with Dr. Ginnie Smart.  She has since established with Dr. Clifton James.  She has known anomalous takeoff of all coronary arteries.  She had abnormal lower extremity arterial Dopplers May 2011 and was seen by Dr. Hart Rochester with vascular surgery.  Lower extremity arterial Dopplers May 2013 showed ABI 0.6 on the right and 0.77 on the left.  Conservative management has been pursued.  There appears to be moderate right distal SFA stenosis from previous documentation.  She was seen July 2021 after ED visit with vague chest discomfort.  History was complicated by dementia.  She reported no recurrent chest pain and no ischemic evaluation was recommended.  She had updated ABIs 12/04/2020 which were unchanged from previous.  She presented to the ED 05/14/2021 with chest pain.  She took aspirin and  nitroglycerin at home and pain resolved. HS-troponin 4 ? 4 ? 4. EKG no acute changes. Echo 05/15/2021 LVEF 60 to 65%, no R WMA, grade 1 diastolic dysfunction, RV normal size and function, normal PASP, mild to moderate aortic valve sclerosis without stenosis.  Protonix was added to her medicine regimen.  Her chest pain was deemed noncardiac.  She presents today for follow up with her husband.  History assisted by her husband due to memory loss and she is rather hard of hearing.  She denies recurrent chest pain, rash, tightness.  Reports no shortness of breath at rest nor dyspnea on exertion.  Her chief complaint today is diarrhea.  Notes this is been  ongoing for couple weeks and improved with Imodium but this morning had to take another dose of Imodium.  Tells me she feels her "belly rumbling". No abdominal pain. No changes in diet. No recurrent indigestion. She did not start Protonix as directed by ED as symptoms resolved and her husband was concerned about potential side effects. No edema, orthopnea, PND. Reports no palpitations.   EKGs/Labs/Other Studies Reviewed:   The following studies were reviewed today:   EKG:  No EKG is  ordered today.    Recent Labs: 05/14/2021: ALT 17; BUN 18; Magnesium 1.8; Potassium 3.2; Sodium 138 05/15/2021: Creatinine, Ser 0.81; Hemoglobin 12.4; Platelets 271  Recent Lipid Panel    Component Value Date/Time   CHOL 155 08/31/2019 0000   TRIG 51 08/31/2019 0000   HDL 59 08/31/2019 0000   CHOLHDL 2.6 08/31/2019 0000   CHOLHDL 3 07/31/2014 1153   VLDL 20.6 07/31/2014 1153   LDLCALC 85 08/31/2019 0000   LDLDIRECT 138.9 07/23/2011 0815   Home Medications   No outpatient medications have been marked as taking for the 06/06/21 encounter (Appointment) with Alver Sorrow, NP.     Review of Systems    All other systems reviewed and are otherwise negative except as noted above.  Physical Exam    VS:  There were no vitals taken for this visit. , BMI There is no height or weight on file to calculate BMI.  Wt Readings from Last 3 Encounters:  05/14/21 114 lb (51.7 kg)  04/08/21 116 lb 8 oz (52.8 kg)  12/19/20 108 lb 8 oz (49.2 kg)     GEN: Frail, well developed, in no acute distress. HEENT: normal. Neck: Supple, no JVD, carotid bruits, or masses. Cardiac: RRR, no murmurs, rubs, or gallops. No clubbing, cyanosis, edema.  Radials/DP/PT 2+ and equal bilaterally.  Respiratory:  Respirations regular and unlabored, clear to auscultation bilaterally. GI: Soft, nontender, nondistended. MS: No deformity or atrophy. Skin: Warm and dry, no rash. Neuro:  Strength and sensation are intact. Psych: Normal  affect.  Assessment & Plan    CAD s/p inferior STEMI 2011 with 3 DES to prox, mid, and distal rCA - known anolamous takeoff of all coronary arteries - Stable with no anginal symptoms. No indication for ischemic evaluation. Recent ED visit with workup revealing non cardiac chest pain. GDMT includes aspirin, atorvastatin, zetia. Heart healthy diet and regular cardiovascular exercise encouraged.   HTN - BP well controlled. Continue current antihypertensive regimen. Refills provided. BP low normal but asymptomatic with no lightheadedness, dizziness. If she becomes symptomatic, plan to reduce regimen.   HLD - 04/11/21 LDL 77. Continue Zetia 10mg  and Atorvastatin 80mg  QD.  Dementia - History assisted by spouse.   PAD - 11/2020 ABI stable compared to previous. No symptoms of claudication. Continue Atorvasatatin, Zetia, Aspirin.  Walking regimen encouraged.   COPD - Continue to follow with PCP. No signs of acute exacerbation.   Diarrhea - Encouraged to discuss with PCP.   Disposition: Follow up in 6 month(s) with Dr. Clifton James or APP   Signed, Alver Sorrow, NP 06/06/2021, 11:20 AM [facility]

## 2021-06-11 DIAGNOSIS — R634 Abnormal weight loss: Secondary | ICD-10-CM | POA: Diagnosis not present

## 2021-06-11 DIAGNOSIS — R197 Diarrhea, unspecified: Secondary | ICD-10-CM | POA: Diagnosis not present

## 2021-06-14 DIAGNOSIS — R634 Abnormal weight loss: Secondary | ICD-10-CM | POA: Diagnosis not present

## 2021-06-14 DIAGNOSIS — R197 Diarrhea, unspecified: Secondary | ICD-10-CM | POA: Diagnosis not present

## 2021-06-17 DIAGNOSIS — R197 Diarrhea, unspecified: Secondary | ICD-10-CM | POA: Diagnosis not present

## 2021-06-17 DIAGNOSIS — R634 Abnormal weight loss: Secondary | ICD-10-CM | POA: Diagnosis not present

## 2021-06-29 ENCOUNTER — Other Ambulatory Visit: Payer: Self-pay | Admitting: Neurology

## 2021-07-24 ENCOUNTER — Ambulatory Visit (INDEPENDENT_AMBULATORY_CARE_PROVIDER_SITE_OTHER): Payer: Medicare PPO | Admitting: Podiatry

## 2021-07-24 ENCOUNTER — Other Ambulatory Visit: Payer: Self-pay

## 2021-07-24 ENCOUNTER — Encounter: Payer: Self-pay | Admitting: Podiatry

## 2021-07-24 DIAGNOSIS — I739 Peripheral vascular disease, unspecified: Secondary | ICD-10-CM | POA: Diagnosis not present

## 2021-07-24 DIAGNOSIS — Q828 Other specified congenital malformations of skin: Secondary | ICD-10-CM | POA: Diagnosis not present

## 2021-07-24 NOTE — Progress Notes (Signed)
This patient returns to my office for at risk foot care.  This patient requires this care by a professional since this patient will be at risk due to having PAD, PVD.  This patient has painful callus on her left forefoot.  This callus is painful walking and wearing her shoes.  This patient presents for at risk foot care today.  General Appearance  Alert, conversant and in no acute stress.  Vascular  Dorsalis pedis and posterior tibial  pulses are weakly  palpable  bilaterally.  Capillary return is within normal limits  bilaterally. Temperature is within normal limits  bilaterally.  Neurologic  Senn-Weinstein monofilament wire test within normal limits  bilaterally. Muscle power within normal limits bilaterally.  Nails Thick disfigured discolored nails with subungual debris  from hallux to fifth toes bilaterally. No evidence of bacterial infection or drainage bilaterally.  Orthopedic  No limitations of motion  feet .  No crepitus or effusions noted.  No bony pathology or digital deformities noted.  Skin  normotropic skin noted bilaterally.  No signs of infections or ulcers noted.   Porokeratosis sub 3 left foot.    Porokeratosis sub 3 left foot.   Consent was obtained for treatment procedures.   Debridement of porokeratosis with # 15 blade.   Return office visit    9 weeks                  Told patient to return for periodic foot care and evaluation due to potential at risk complications.   Kirby Cortese DPM  

## 2021-08-13 DIAGNOSIS — H908 Mixed conductive and sensorineural hearing loss, unspecified: Secondary | ICD-10-CM | POA: Diagnosis not present

## 2021-08-13 DIAGNOSIS — I739 Peripheral vascular disease, unspecified: Secondary | ICD-10-CM | POA: Diagnosis not present

## 2021-08-13 DIAGNOSIS — I110 Hypertensive heart disease with heart failure: Secondary | ICD-10-CM | POA: Diagnosis not present

## 2021-08-13 DIAGNOSIS — R634 Abnormal weight loss: Secondary | ICD-10-CM | POA: Diagnosis not present

## 2021-08-13 DIAGNOSIS — F0151 Vascular dementia with behavioral disturbance: Secondary | ICD-10-CM | POA: Diagnosis not present

## 2021-08-13 DIAGNOSIS — Z0001 Encounter for general adult medical examination with abnormal findings: Secondary | ICD-10-CM | POA: Diagnosis not present

## 2021-08-13 DIAGNOSIS — M130 Polyarthritis, unspecified: Secondary | ICD-10-CM | POA: Diagnosis not present

## 2021-08-14 ENCOUNTER — Other Ambulatory Visit: Payer: Self-pay

## 2021-08-14 DIAGNOSIS — I10 Essential (primary) hypertension: Secondary | ICD-10-CM

## 2021-08-14 MED ORDER — HYDROCHLOROTHIAZIDE 25 MG PO TABS: 25.0000 mg | ORAL_TABLET | Freq: Every day | ORAL | 2 refills | Status: DC

## 2021-09-11 DIAGNOSIS — I25119 Atherosclerotic heart disease of native coronary artery with unspecified angina pectoris: Secondary | ICD-10-CM | POA: Diagnosis not present

## 2021-09-11 DIAGNOSIS — R32 Unspecified urinary incontinence: Secondary | ICD-10-CM | POA: Diagnosis not present

## 2021-09-11 DIAGNOSIS — E785 Hyperlipidemia, unspecified: Secondary | ICD-10-CM | POA: Diagnosis not present

## 2021-09-11 DIAGNOSIS — G4700 Insomnia, unspecified: Secondary | ICD-10-CM | POA: Diagnosis not present

## 2021-09-11 DIAGNOSIS — I252 Old myocardial infarction: Secondary | ICD-10-CM | POA: Diagnosis not present

## 2021-09-11 DIAGNOSIS — E0590 Thyrotoxicosis, unspecified without thyrotoxic crisis or storm: Secondary | ICD-10-CM | POA: Diagnosis not present

## 2021-09-11 DIAGNOSIS — F0150 Vascular dementia without behavioral disturbance: Secondary | ICD-10-CM | POA: Diagnosis not present

## 2021-09-11 DIAGNOSIS — I739 Peripheral vascular disease, unspecified: Secondary | ICD-10-CM | POA: Diagnosis not present

## 2021-09-11 DIAGNOSIS — I10 Essential (primary) hypertension: Secondary | ICD-10-CM | POA: Diagnosis not present

## 2021-09-15 ENCOUNTER — Other Ambulatory Visit: Payer: Self-pay

## 2021-09-15 ENCOUNTER — Emergency Department (HOSPITAL_BASED_OUTPATIENT_CLINIC_OR_DEPARTMENT_OTHER)
Admission: EM | Admit: 2021-09-15 | Discharge: 2021-09-15 | Disposition: A | Discharge status: Home / Self Care | Payer: Medicare PPO | Attending: Emergency Medicine | Admitting: Emergency Medicine

## 2021-09-15 ENCOUNTER — Encounter (HOSPITAL_BASED_OUTPATIENT_CLINIC_OR_DEPARTMENT_OTHER): Payer: Self-pay | Admitting: Emergency Medicine

## 2021-09-15 ENCOUNTER — Emergency Department (HOSPITAL_BASED_OUTPATIENT_CLINIC_OR_DEPARTMENT_OTHER): Payer: Medicare PPO

## 2021-09-15 DIAGNOSIS — I2510 Atherosclerotic heart disease of native coronary artery without angina pectoris: Secondary | ICD-10-CM | POA: Insufficient documentation

## 2021-09-15 DIAGNOSIS — I10 Essential (primary) hypertension: Secondary | ICD-10-CM | POA: Insufficient documentation

## 2021-09-15 DIAGNOSIS — Z7982 Long term (current) use of aspirin: Secondary | ICD-10-CM | POA: Insufficient documentation

## 2021-09-15 DIAGNOSIS — M5440 Lumbago with sciatica, unspecified side: Secondary | ICD-10-CM

## 2021-09-15 DIAGNOSIS — M5442 Lumbago with sciatica, left side: Secondary | ICD-10-CM | POA: Diagnosis not present

## 2021-09-15 DIAGNOSIS — M79605 Pain in left leg: Secondary | ICD-10-CM | POA: Diagnosis not present

## 2021-09-15 DIAGNOSIS — J449 Chronic obstructive pulmonary disease, unspecified: Secondary | ICD-10-CM | POA: Insufficient documentation

## 2021-09-15 DIAGNOSIS — Z96643 Presence of artificial hip joint, bilateral: Secondary | ICD-10-CM | POA: Insufficient documentation

## 2021-09-15 DIAGNOSIS — Z87891 Personal history of nicotine dependence: Secondary | ICD-10-CM | POA: Diagnosis not present

## 2021-09-15 DIAGNOSIS — M545 Low back pain, unspecified: Secondary | ICD-10-CM | POA: Diagnosis not present

## 2021-09-15 DIAGNOSIS — Z043 Encounter for examination and observation following other accident: Secondary | ICD-10-CM | POA: Diagnosis not present

## 2021-09-15 MED ORDER — KETOROLAC TROMETHAMINE 60 MG/2ML IM SOLN
30.0000 mg | Freq: Once | INTRAMUSCULAR | Status: AC
  Administered 2021-09-15: 30 mg via INTRAMUSCULAR
  Filled 2021-09-15: qty 2

## 2021-09-15 MED ORDER — CYCLOBENZAPRINE HCL 5 MG PO TABS: 5.0000 mg | ORAL_TABLET | Freq: Two times a day (BID) | ORAL | 0 refills | Status: DC | PRN

## 2021-09-15 MED ORDER — LIDOCAINE 5 % EX PTCH: 1.0000 | MEDICATED_PATCH | CUTANEOUS | 0 refills | Status: DC

## 2021-09-15 MED ORDER — DEXAMETHASONE 4 MG PO TABS
10.0000 mg | ORAL_TABLET | Freq: Once | ORAL | Status: AC
  Administered 2021-09-15: 10 mg via ORAL
  Filled 2021-09-15: qty 3

## 2021-09-15 NOTE — ED Triage Notes (Addendum)
Pt c/o left leg pain x 1 1/2 weeks radiates up to back. Pt took a nitroglycerin 5 mins prior to arrival for episode of chest pain with relief. Patient reports she had anxiety at the time. Pt fell 2-3 weeks ago in the bathroom getting up from toilet.

## 2021-09-15 NOTE — Discharge Instructions (Addendum)
You may take acetaminophen (tylenol) up to 1000 mg 4 times a day for 1 week. This is the maximum dose of Tylenol you can take from all sources. Please check other over-the-counter medications and prescriptions to ensure you are not taking other medications that contain acetaminophen.  You may also take ibuprofen 400 mg 6 times a day alternating with or at the same time as tylenol.  Take oxycodone as needed for breakthrough pain.  This medication can be addicting, sedating and cause constipation.

## 2021-09-15 NOTE — ED Provider Notes (Signed)
[facility] EMERGENCY DEPARTMENT Provider Note   CSN: [id number] Arrival date & time: 09/15/21  1102     History Chief Complaint  Patient presents with   Leg Pain    Kristine [name] is a 80 y.o. female.  HPI     51 female with history of htn, COPD, hyperlipidemia, CVA, CAD, who presents with concern for left leg pain and back pain.  Had a fall into commode about 2 weeks ago. Didn't initially have pain, but about 1.5 weeks ago began to have pain to the back of her left leg with radiation upwards, then began to have pain to the lower back, across the lower back.  Pain is severe. Worse when she goes from laying down to standing up.  No abdominal pain, numbness, weakness, urinary symptoms, loss of control of bowel or bladder.  No headaches.  In triage she had episode of CP that was brief and resolved with single nitroglycerin, however we did not discuss this during our evaluation. No fevers.  NO leg swelling.  Past Medical History:  Diagnosis Date   COPD (chronic obstructive pulmonary disease) (HCC)    Hard of hearing    History of anemia    History of blood transfusion    History of pneumonia    HTN (hypertension)    Hyperlipidemia    Hyperthyroidism    treated wtih radioactive iodine   OA (osteoarthritis of spine)    PVD (peripheral vascular disease) (HCC)    Lower extremity Dopplers  May 2revealed an ABI of 0.83 in the right posterior    Shortness of breath dyspnea    STEMI (ST elevation myocardial infarction) [facility]) May 2011   Promus DES in the proximal, mid and distal RCA in May 2011   Stroke [facility])    Urinary frequency    Urinary urgency     Patient Active Problem List   Diagnosis Date Noted   Chest pain 05/14/2021   Somniloquy 04/08/2021   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   Snoring 12/19/2020   Excessive daytime sleepiness 12/19/2020   Gait disturbance 12/19/2020   Pain due to onychomycosis of toenails of both feet 05/25/2019   Porokeratosis  05/25/2019   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   Angioedema 05/31/2015   Fever 03/11/2015   Acute blood loss anemia 03/11/2015   Melena 03/10/2015   GI bleed 03/09/2015   Peripheral arterial disease (HCC) 03/01/2012   CAD (coronary artery disease) 01/20/2012   HTN (hypertension)    Hyperlipidemia    PVD (peripheral vascular disease) (HCC)    COPD (chronic obstructive pulmonary disease) (HCC)    Hyperthyroidism    OA (osteoarthritis of spine)    STEMI (ST elevation myocardial infarction) (HCC) 04/07/2010    Past Surgical History:  Procedure Laterality Date   ABDOMINAL HYSTERECTOMY     Had ovarian resection and required lysis of adhesions   CARDIAC CATHETERIZATION  04/07/2010   EF 60-70%   CORONARY STENTS     x3   ESOPHAGOGASTRODUODENOSCOPY N/A 03/10/2015   Procedure: ESOPHAGOGASTRODUODENOSCOPY (EGD);  Surgeon: Wandalee Ferdinand, MD;  Location: [facility] ENDOSCOPY;  Service: Endoscopy;  Laterality: N/A;   OVARY SURGERY     TOTAL HIP ARTHROPLASTY  2004   left   TOTAL HIP ARTHROPLASTY Right 01/23/2016   Procedure: RIGHT TOTAL HIP ARTHROPLASTY ANTERIOR APPROACH;  Surgeon: Loreta Ave, MD;  Location: [facility] OR;  Service: Orthopedics;  Laterality: Right;     OB History  No obstetric history on file.     Family History  Problem Relation Age of Onset   Peripheral vascular disease Mother    Congestive Heart Failure Mother    Heart attack Father     Social History   Tobacco Use   Smoking status: Former    Packs/day: 1.00    Years: 45.00    Pack years: 45.00    Types: Cigarettes    Quit date: 04/07/2010    Years since quitting: 11.4   Smokeless tobacco: Never  Vaping Use   Vaping Use: Never used  Substance Use Topics   Alcohol use: Yes    Comment: rarely    Drug use: No    Home Medications Prior to Admission medications   Medication Sig Start Date End Date Taking? Authorizing Provider  cyclobenzaprine (FLEXERIL) 5 MG  tablet Take 1-2 tablets (5-10 mg total) by mouth 2 (two) times daily as needed for muscle spasms. 09/15/21  Yes Alvira Monday, MD  lidocaine (LIDODERM) 5 % Place 1 patch onto the skin daily. Remove & Discard patch within 12 hours or as directed by MD 09/15/21  Yes Alvira Monday, MD  aspirin EC 81 MG tablet Take 81 mg by mouth daily.    [provider]  atorvastatin (LIPITOR) 80 MG tablet Take 1 tablet (80 mg total) by mouth daily. 06/06/21   Alver Sorrow, NP  BREZTRI AEROSPHERE 160-9-4.8 MCG/ACT AERO Inhale 2 puffs into the lungs 2 (two) times daily. 10/17/20   [provider]  clonazePAM (KLONOPIN) 0.5 MG tablet TAKE 1/2 TO 1 TABLET BY MOUTH EVERY NIGHT AT BEDTIME 07/01/21   Sater, Pearletha Furl, MD  ezetimibe (ZETIA) 10 MG tablet Take 1 tablet (10 mg total) by mouth daily. 06/06/21   Alver Sorrow, NP  hydrALAZINE (APRESOLINE) 25 MG tablet TAKE 1 TABLET(25 MG) BY MOUTH DAILY. 06/06/21   Alver Sorrow, NP  hydrochlorothiazide (HYDRODIURIL) 25 MG tablet Take 1 tablet (25 mg total) by mouth daily. 08/14/21   Kathleene Hazel, MD  melatonin 3 MG TABS tablet Take 3 mg by mouth at bedtime as needed (sleep).    [provider]  methimazole (TAPAZOLE) 5 MG tablet Take 5 mg by mouth daily.    [provider]  metoprolol succinate (TOPROL-XL) 50 MG 24 hr tablet Take 1 tablet (50 mg total) by mouth daily. 06/06/21   Alver Sorrow, NP  nitroGLYCERIN (NITROSTAT) 0.3 MG SL tablet Place 0.3 mg under the tongue every 5 (five) minutes as needed for chest pain.    [provider]    Allergies    Ace inhibitors, Iodinated diagnostic agents, Other, Shellfish-derived products, and Hydrocodone  Review of Systems   Review of Systems  Constitutional:  Negative for fever.  Cardiovascular:  Negative for leg swelling. Chest pain: mentioned episode in CP in triage, resolved with single nitro and no current cp. Gastrointestinal:  Negative for abdominal pain,  nausea and vomiting.  Genitourinary:  Negative for difficulty urinating and dysuria.  Musculoskeletal:  Positive for arthralgias and back pain.  Skin:  Negative for rash.  Neurological:  Negative for weakness, numbness and headaches.   Physical Exam Updated Vital Signs BP (!) 144/82 (BP Location: Right Arm)   Pulse 61   Temp 98.2 F (36.8 C) (Oral)   Resp 16   Ht 5\' 2"  (1.575 m)   Wt 48 kg   SpO2 99%   BMI 19.37 kg/m   Physical Exam Vitals and nursing note reviewed.  Constitutional:      General: She is not in acute distress.    Appearance: Normal appearance. She is not ill-appearing, toxic-appearing or diaphoretic.  HENT:     Head: Normocephalic.  Eyes:     Conjunctiva/sclera: Conjunctivae normal.  Cardiovascular:     Rate and Rhythm: Normal rate and regular rhythm.     Pulses: Normal pulses.  Pulmonary:     Effort: Pulmonary effort is normal. No respiratory distress.  Musculoskeletal:        General: Tenderness (left lower back, lateral to midline) present. No deformity or signs of injury.     Cervical back: No rigidity.  Skin:    General: Skin is warm and dry.     Coloration: Skin is not jaundiced or pale.  Neurological:     General: No focal deficit present.     Mental Status: She is alert and oriented to person, place, and time.     GCS: GCS eye subscore is 4. GCS verbal subscore is 5. GCS motor subscore is 6.     Sensory: No sensory deficit.     Motor: No weakness.    ED Results / Procedures / Treatments   Labs (all labs ordered are listed, but only abnormal results are displayed) Labs Reviewed - No data to display  EKG None  Radiology DG Lumbar Spine Complete  Result Date: 09/15/2021 CLINICAL DATA:  Status post fall with back pain EXAM: LUMBAR SPINE - COMPLETE 4+ VIEW COMPARISON:  None. FINDINGS: There is no evidence of lumbar spine fracture. Scoliosis of spine is noted. Intervertebral disc spaces are maintained. IMPRESSION: No acute fracture or  dislocation. Scoliosis of spine. Electronically Signed   By: Sherian Rein M.D.   On: 09/15/2021 14:32   DG Hip Unilat W or Wo Pelvis 2-3 Views Left  Result Date: 09/15/2021 CLINICAL DATA:  Status post fall. EXAM: DG HIP (WITH OR WITHOUT PELVIS) 2-3V LEFT COMPARISON:  None. FINDINGS: There is no evidence of hip fracture or dislocation. Bilateral hip replacements are identified, no malalignment. IMPRESSION: No acute fracture or dislocation. Electronically Signed   By: Sherian Rein M.D.   On: 09/15/2021 14:30    Procedures Procedures   Medications Ordered in ED Medications  ketorolac (TORADOL) injection 30 mg (30 mg Intramuscular Given 09/15/21 1408)  dexamethasone (DECADRON) tablet 10 mg (10 mg Oral Given 09/15/21 1516)    ED Course  I have reviewed the triage vital signs and the nursing notes.  Pertinent labs & imaging results that were available during my care of the patient were reviewed by me and considered in my medical decision making (see chart for details).    MDM Rules/Calculators/A&P                           66 female with history of htn, COPD, hyperlipidemia, CVA, CAD, who presents with concern for left leg pain and back pain.  XR obtained show no evidence of fracture.  Normal pulses, no sign of acute arterial occlusion. No abdominal pain, pain worse with movements, not consistent with dissection. No asymmetric leg swelling or signs of DVT.  Normal strength, no signs on hx or exam of cauda equina, epidural abscess, intraabdominal etiology or urinary tract infection. Suspect likely msk etiology//herniated disc with sciatica on left.  Given dose of decadron, recommend tylenol, ibuprofen, flexeril, lidoderm and PCP follow up. Patient discharged in stable condition with understanding of reasons to return.  Final Clinical Impression(s) /  ED Diagnoses Final diagnoses:  Left leg pain  Acute bilateral low back pain with sciatica, sciatica laterality unspecified    Rx / DC  Orders ED Discharge Orders          Ordered    cyclobenzaprine (FLEXERIL) 5 MG tablet  2 times daily PRN        09/15/21 1458    lidocaine (LIDODERM) 5 %  Every 24 hours        09/15/21 1458             Alvira Monday, MD 09/15/21 2025

## 2021-09-15 NOTE — ED Notes (Signed)
Pt NAD, a/ox4. Pt verbalizes understanding of all DC and f/u instructions. All questions answered. Pt wheeled to lobby at DC with spouse.

## 2021-09-17 DIAGNOSIS — M545 Low back pain, unspecified: Secondary | ICD-10-CM | POA: Diagnosis not present

## 2021-09-17 DIAGNOSIS — M4106 Infantile idiopathic scoliosis, lumbar region: Secondary | ICD-10-CM | POA: Diagnosis not present

## 2021-09-23 ENCOUNTER — Ambulatory Visit: Payer: Medicare PPO | Admitting: Podiatry

## 2021-09-23 ENCOUNTER — Encounter: Payer: Self-pay | Admitting: Podiatry

## 2021-09-23 ENCOUNTER — Other Ambulatory Visit: Payer: Self-pay

## 2021-09-23 DIAGNOSIS — M79675 Pain in left toe(s): Secondary | ICD-10-CM

## 2021-09-23 DIAGNOSIS — Q828 Other specified congenital malformations of skin: Secondary | ICD-10-CM | POA: Diagnosis not present

## 2021-09-23 DIAGNOSIS — M79674 Pain in right toe(s): Secondary | ICD-10-CM

## 2021-09-23 DIAGNOSIS — I739 Peripheral vascular disease, unspecified: Secondary | ICD-10-CM

## 2021-09-23 DIAGNOSIS — B351 Tinea unguium: Secondary | ICD-10-CM

## 2021-09-23 NOTE — Progress Notes (Signed)
This patient returns to my office for at risk foot care.  This patient requires this care by a professional since this patient will be at risk due to having PAD, PVD.  This patient has painful callus on her left forefoot.  This callus is painful walking and wearing her shoes.  This patient presents for at risk foot care today.  General Appearance  Alert, conversant and in no acute stress.  Vascular  Dorsalis pedis and posterior tibial  pulses are weakly  palpable  bilaterally.  Capillary return is within normal limits  bilaterally. Temperature is within normal limits  bilaterally.  Neurologic  Senn-Weinstein monofilament wire test within normal limits  bilaterally. Muscle power within normal limits bilaterally.  Nails Thick disfigured discolored nails with subungual debris  from hallux to fifth toes bilaterally. No evidence of bacterial infection or drainage bilaterally.  Orthopedic  No limitations of motion  feet .  No crepitus or effusions noted.  No bony pathology or digital deformities noted.  Skin  normotropic skin noted bilaterally.  No signs of infections or ulcers noted.   Porokeratosis sub 3 left foot.    Porokeratosis sub 3 left foot.   Consent was obtained for treatment procedures.   Debridement of porokeratosis with # 15 blade.   Return office visit    9 weeks                  Told patient to return for periodic foot care and evaluation due to potential at risk complications.   Nikkolas Coomes DPM  

## 2021-09-30 ENCOUNTER — Telehealth: Payer: Self-pay | Admitting: Neurology

## 2021-09-30 ENCOUNTER — Other Ambulatory Visit: Payer: Self-pay | Admitting: Family

## 2021-09-30 DIAGNOSIS — E782 Mixed hyperlipidemia: Secondary | ICD-10-CM

## 2021-09-30 DIAGNOSIS — I25118 Atherosclerotic heart disease of native coronary artery with other forms of angina pectoris: Secondary | ICD-10-CM

## 2021-09-30 NOTE — Telephone Encounter (Signed)
*  STAT* If patient is at the pharmacy, call can be transferred to refill team.   1. Which medications need to be refilled? (please list name of each medication and dose if known) Atorvastatin  2. Which pharmacy/location (including street and city if local pharmacy) is medication to be sent to? [facility] [address] [place]  3. Do they need a 30 day or 90 day supply? 90 days and refills

## 2021-09-30 NOTE — Telephone Encounter (Signed)
Pt's husband has called for a refill on pt's clonazePAM (KLONOPIN) 0.5 MG tablet to [id number]

## 2021-09-30 NOTE — Telephone Encounter (Signed)
Called and LVM for husband letting him know there should be refills on file at [facility] for clonazepam and advised him to reach out to pharmacy to process refill. Asked him to call back if he has any more questions/concerns.

## 2021-10-10 DIAGNOSIS — M130 Polyarthritis, unspecified: Secondary | ICD-10-CM | POA: Diagnosis not present

## 2021-10-10 DIAGNOSIS — E782 Mixed hyperlipidemia: Secondary | ICD-10-CM | POA: Diagnosis not present

## 2021-10-10 DIAGNOSIS — Z681 Body mass index (BMI) 19 or less, adult: Secondary | ICD-10-CM | POA: Diagnosis not present

## 2021-10-10 DIAGNOSIS — E1169 Type 2 diabetes mellitus with other specified complication: Secondary | ICD-10-CM | POA: Diagnosis not present

## 2021-10-10 DIAGNOSIS — E079 Disorder of thyroid, unspecified: Secondary | ICD-10-CM | POA: Diagnosis not present

## 2021-10-10 DIAGNOSIS — R634 Abnormal weight loss: Secondary | ICD-10-CM | POA: Diagnosis not present

## 2021-10-10 DIAGNOSIS — I10 Essential (primary) hypertension: Secondary | ICD-10-CM | POA: Diagnosis not present

## 2021-10-14 ENCOUNTER — Encounter: Payer: Self-pay | Admitting: Family Medicine

## 2021-10-14 ENCOUNTER — Ambulatory Visit: Payer: Medicare PPO | Admitting: Family Medicine

## 2021-10-14 VITALS — BP 124/79 | HR 72 | Ht 62.0 in | Wt 105.5 lb

## 2021-10-14 DIAGNOSIS — G4752 REM sleep behavior disorder: Secondary | ICD-10-CM | POA: Diagnosis not present

## 2021-10-14 NOTE — Progress Notes (Signed)
Chief Complaint  Patient presents with   Follow-up    Rm 1, alone. Here for 6 month f/u. Pt reports dong well. Pts husband states she will clap during her sleep and talk while she's asleep.       HISTORY OF PRESENT ILLNESS:  10/14/21 ALL:  Kristine [name] is a 80 y.o. female here today for follow up for RBD. She continues clonazepam 0.5mg  at bedtime. She is tolerating medication well. She feels that she is resting well. Her husband reports that she may talk or clap for a few minutes 3-4 times a week. She is easily redirected to go back to sleep and usually sleeps soundly for the rest of the night. She reports that she does have some difficulty with recall. She is able to perform ADLs independently. She works in the yard often. She does not drive. She was having trouble locating her parked car and had an event where someone had to take her home as she couldn't remember how to get home in 2021. Mr Montez Morita assists with medications. Her daughter helps with finances. She has two daughters that help care for her. Her husband continues to work as a cross guard at a Primary school teacher school. He drives without difficulty. She is eating normally. No falls.     HISTORY (copied from Dr Bonnita Hollow previous note)  Kristine [name] is a 80 year old woman with sleep-talkingand active dreams x 1-2 yw\ears.     Update 04/08/2021: She is talking in her sleep nightly - with mumbling and sometimes formed conversation.  She moves her arms some.   She has some purposeful movements like reaching or clapping.  She is a retired Runner, broadcasting/film/video and sometimes acts like she is on a class.  She has not had any more episodes of yelling or thinking she is being attacked.  In general, she is doing better since starting the clonazepam..   She has yelled out in her sleep but this is not violent or defensive.   She has not gotten out of bed.   Episodes can occur any time after 1-2 hours of sleep.  They can occur during longer naps also. She  started clonazepam 0.25 mg and is now on 0.5 mg.   She tolerates it well and it has reduced the intensity of the behavior with fewer movements though she continues to have sleep-talking.    She wakes up refreshed and has no side effects.      She snores at night and sometimes has blowing/puffing but no definite pauses or gasps.   She will sometimes be sleepy in the evening.  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   She has no tremors.  Gait is stable but worse than a couple years ago due to calluses on her feet and a total hip replacement.   The stride is mildly reduced and she has had a couple falls over the past 2 years.   She has HTN, CAD and hyperthyroidism.     POLYSOMNOGRAM 01/08/2021 1.  No significant OSA throughout most of the night (AHI = 1.4).  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  Sleep talking during REM Sleep 3.  Rapid sleep onset (0 minutes) with sleep efficiency = 64%.  No N3 sleep recorded   EPWORTH SLEEPINESS SCALE   On a scale of 0 - 3 what is the chance of dozing:  Sitting and Reading:                           2 Watching TV:                                      3 Sitting inactive in a public place:        0 Passenger in car for one hour:           3 Lying down to rest in the afternoon:   3 Sitting and talking to someone:          1 Sitting quietly after lunch:                   3 In a car, stopped in traffic:                  0   Total (out of 24):   15/24   Moderate excessive daytime sleepiness.    REVIEW OF SYSTEMS: Out of a complete 14 system review of symptoms, the patient complains only of the following symptoms, short term memory loss, restless sleep, and all other reviewed systems are negative.   ALLERGIES: Allergies  Allergen Reactions   Ace Inhibitors Swelling    Angioedema of the tongue   Iodinated Diagnostic Agents Anaphylaxis   Other Anaphylaxis    Allergic to  melons, nuts   Shellfish-Derived Products Anaphylaxis   Hydrocodone Other (See Comments)    unspecified     HOME MEDICATIONS: Outpatient Medications Prior to Visit  Medication Sig Dispense Refill   aspirin EC 81 MG tablet Take 81 mg by mouth daily.     atorvastatin (LIPITOR) 80 MG tablet TAKE 1 TABLET(80 MG) BY MOUTH DAILY 90 tablet 0   BREZTRI AEROSPHERE 160-9-4.8 MCG/ACT AERO Inhale 2 puffs into the lungs 2 (two) times daily.     clonazePAM (KLONOPIN) 0.5 MG tablet TAKE 1/2 TO 1 TABLET BY MOUTH EVERY NIGHT AT BEDTIME 30 tablet 5   cyclobenzaprine (FLEXERIL) 5 MG tablet Take 1-2 tablets (5-10 mg total) by mouth 2 (two) times daily as needed for muscle spasms. 30 tablet 0   ezetimibe (ZETIA) 10 MG tablet Take 1 tablet (10 mg total) by mouth daily. 90 tablet 2   hydrALAZINE (APRESOLINE) 25 MG tablet TAKE 1 TABLET(25 MG) BY MOUTH DAILY. 90 tablet 3   hydrochlorothiazide (HYDRODIURIL) 25 MG tablet Take 1 tablet (25 mg total) by mouth daily. 90 tablet 2   lidocaine (LIDODERM) 5 % Place 1 patch onto the skin daily. Remove & Discard patch within 12 hours or as directed by MD 30 patch 0   melatonin 3 MG TABS tablet Take 3 mg by mouth at bedtime as needed (sleep).     methimazole (TAPAZOLE) 5 MG tablet Take 5 mg by mouth daily.     metoprolol succinate (TOPROL-XL) 50 MG 24 hr tablet Take 1 tablet (50 mg total) by mouth daily. 90 tablet 2   nitroGLYCERIN (NITROSTAT) 0.3 MG SL tablet Place 0.3 mg under the tongue every 5 (five) minutes as needed for chest pain.     No facility-administered medications prior to visit.     PAST MEDICAL HISTORY: Past Medical History:  Diagnosis Date   COPD (chronic obstructive pulmonary disease) (HCC)    Hard of hearing    History of anemia    History  of blood transfusion    History of pneumonia    HTN (hypertension)    Hyperlipidemia    Hyperthyroidism    treated wtih radioactive iodine   OA (osteoarthritis of spine)    PVD (peripheral vascular disease)  (HCC)    Lower extremity Dopplers  May 2revealed an ABI of 0.83 in the right posterior    Shortness of breath dyspnea    STEMI (ST elevation myocardial infarction) [facility]) May 2011   Promus DES in the proximal, mid and distal RCA in May 2011   Stroke [facility])    Urinary frequency    Urinary urgency      PAST SURGICAL HISTORY: Past Surgical History:  Procedure Laterality Date   ABDOMINAL HYSTERECTOMY     Had ovarian resection and required lysis of adhesions   CARDIAC CATHETERIZATION  04/07/2010   EF 60-70%   CORONARY STENTS     x3   ESOPHAGOGASTRODUODENOSCOPY N/A 03/10/2015   Procedure: ESOPHAGOGASTRODUODENOSCOPY (EGD);  Surgeon: Wandalee Ferdinand, MD;  Location: [facility] ENDOSCOPY;  Service: Endoscopy;  Laterality: N/A;   OVARY SURGERY     TOTAL HIP ARTHROPLASTY  2004   left   TOTAL HIP ARTHROPLASTY Right 01/23/2016   Procedure: RIGHT TOTAL HIP ARTHROPLASTY ANTERIOR APPROACH;  Surgeon: Loreta Ave, MD;  Location: [facility] OR;  Service: Orthopedics;  Laterality: Right;     FAMILY HISTORY: Family History  Problem Relation Age of Onset   Peripheral vascular disease Mother    Congestive Heart Failure Mother    Heart attack Father      SOCIAL HISTORY: Social History   Socioeconomic History   Marital status: Married    Spouse name: Cabin crew   Number of children: 3   Years of education: Masters   Highest education level: Not on file  Occupational History   Occupation: Retired  Tobacco Use   Smoking status: Former    Packs/day: 1.00    Years: 45.00    Pack years: 45.00    Types: Cigarettes    Quit date: 04/07/2010    Years since quitting: 11.5   Smokeless tobacco: Never  Vaping Use   Vaping Use: Never used  Substance and Sexual Activity   Alcohol use: Yes    Comment: rarely    Drug use: No   Sexual activity: Not on file  Other Topics Concern   Not on file  Social History Narrative   Lives with husband, daughter and her son   Right handed   Tea sometimes, decaf coffee   Social  Determinants of Health   Financial Resource Strain: Not on file  Food Insecurity: Not on file  Transportation Needs: Not on file  Physical Activity: Not on file  Stress: Not on file  Social Connections: Not on file  Intimate Partner Violence: Not on file     PHYSICAL EXAM  Vitals:   10/14/21 1515  BP: 124/79  Pulse: 72  Weight: 105 lb 8 oz (47.9 kg)  Height:  (1.575 m)   Body mass index is 19.3 kg/m.  Generalized: Well developed, in no acute distress  Cardiology: normal rate and rhythm, no murmur auscultated  Respiratory: clear to auscultation bilaterally    Neurological examination  Mentation: Alert oriented to time, place, history taking. Follows all commands speech and language fluent Cranial nerve II-XII: Pupils were equal round reactive to light. Extraocular movements were full, visual field were full on confrontational test. Facial sensation and strength were normal. Head turning and shoulder shrug  were normal  and symmetric. Motor: The motor testing reveals 5 over 5 strength of all 4 extremities.  Sensory: Sensory testing is intact to soft touch on all 4 extremities. No evidence of extinction is noted.  Coordination: Cerebellar testing reveals good finger-nose-finger and heel-to-shin bilaterally.  Gait and station: Gait is short but stable with cane, not shuffling. Tandem not attempted    DIAGNOSTIC DATA (LABS, IMAGING, TESTING) - I reviewed patient records, labs, notes, testing and imaging myself where available.  Lab Results  Component Value Date   WBC 5.6 05/15/2021   HGB 12.4 05/15/2021   HCT 37.5 05/15/2021   MCV 89.5 05/15/2021   PLT 271 05/15/2021      Component Value Date/Time   NA 138 05/14/2021 2143   K 3.2 (L) 05/14/2021 2143   CL 102 05/14/2021 2143   CO2 29 05/14/2021 2143   GLUCOSE 114 (H) 05/14/2021 2143   BUN 18 05/14/2021 2143   CREATININE 0.81 05/15/2021 0331   CALCIUM 9.0 05/14/2021 2143   PROT 7.1 05/14/2021 2143   PROT 7.6  08/31/2019 0000   ALBUMIN 3.5 05/14/2021 2143   ALBUMIN 4.5 08/31/2019 0000   AST 26 05/14/2021 2143   ALT 17 05/14/2021 2143   ALKPHOS 76 05/14/2021 2143   BILITOT 0.9 05/14/2021 2143   BILITOT 0.9 08/31/2019 0000   GFRNONAA >60 05/15/2021 0331   GFRAA >60 06/23/2020 2120   Lab Results  Component Value Date   CHOL 155 08/31/2019   HDL 59 08/31/2019   LDLCALC 85 08/31/2019   LDLDIRECT 138.9 07/23/2011   TRIG 51 08/31/2019   CHOLHDL 2.6 08/31/2019   Lab Results  Component Value Date   HGBA1C  05/30/2010    5.5 (NOTE)                                                                       According to the ADA Clinical Practice Recommendations for 2011, when HbA1c is used as a screening test:   >=6.5%   Diagnostic of Diabetes Mellitus           (if abnormal result  is confirmed)  5.7-6.4%   Increased risk of developing Diabetes Mellitus  References:Diagnosis and Classification of Diabetes Mellitus,Diabetes Care,2011,34(Suppl 1):S62-S69 and Standards of Medical Care in         Diabetes - 2011,Diabetes Care,2011,34  (Suppl 1):S11-S61.   No results found for: VITAMINB12 Lab Results  Component Value Date   TSH 0.283 (L) 05/31/2015    No flowsheet data found.   No flowsheet data found.   ASSESSMENT AND PLAN  80 y.o. year old female  has a past medical history of COPD (chronic obstructive pulmonary disease) (HCC), Hard of hearing, History of anemia, History of blood transfusion, History of pneumonia, HTN (hypertension), Hyperlipidemia, Hyperthyroidism, OA (osteoarthritis of spine), PVD (peripheral vascular disease) (HCC), Shortness of breath dyspnea, STEMI (ST elevation myocardial infarction) [facility]) (May 2011), Stroke [facility]), Urinary frequency, and Urinary urgency. here with    REM behavioral disorder  Kristine [name] is doing well. She is resting better on clonazepam 0.5mg  daily. We will continue current plan. Safety precautions discussed. Educational material provided on quality sleep  practices. We will continue to monitor memory/cognitive functioning. No obvious parkinsonian signs on exam. Healthy lifestyle  habits encouraged. She will follow up in 6 months, sooner if needed.   No orders of the defined types were placed in this encounter.    No orders of the defined types were placed in this encounter.     Shawnie Dapper, MSN, FNP-C 10/14/2021, 4:06 PM  [facility] [address] [place], [place] [address] [phone]

## 2021-10-14 NOTE — Patient Instructions (Signed)
Below is our plan:  We will continue clonazepam 0.5mg  daily at bedtime   Please make sure you are staying well hydrated. I recommend 50-60 ounces daily. Well balanced diet and regular exercise encouraged. Consistent sleep schedule with 6-8 hours recommended.   Please continue follow up with care team as directed.   Follow up with Dr Epimenio Foot in 6 months   You may receive a survey regarding today's visit. I encourage you to leave honest feed back as I do use this information to improve patient care. Thank you for seeing me today!

## 2021-10-28 ENCOUNTER — Ambulatory Visit: Payer: Medicare PPO | Admitting: Podiatry

## 2021-10-29 ENCOUNTER — Encounter: Payer: Self-pay | Admitting: Physical Medicine and Rehabilitation

## 2021-11-02 ENCOUNTER — Telehealth: Payer: Self-pay | Admitting: Neurology

## 2021-11-02 NOTE — Telephone Encounter (Signed)
I received a call from an irate patient's wife stating that every month she has a problem getting clonazepam for REM Sleep disorder. It appears to be due to a pharmacy issue and nothing we have done wrong, patient was given a prescription and has refills left on her clonazepam, she called [facility] on [address] earlier this week and it was filled there last Wednesday but when she went to her [facility] pharmacy to pick it up today because she out of meds, she found out the pharmacy is closed all weekend. Just closed. Her prescription is there filled waiting for her, but the whole store is closed until Monday. She was upset which was initially directed towards me, but I told her we are on her side and asked her to calm down and all was fine.  Her prescription was filled and ready to pick up but the store is closed until Monday so she can't get it and is out of meds and just upset.  So, I called the closest store 24 hour open store which is unfortunately across town at [address] and [address]. I spoke to a very kind pharmacist, Ben,at [address], who is of course innocent in this whole mess too. He states [facility] is closing multiple stores around the area and leaving his store open 24 hours to service everyone and deal with all of this. I thanked him for his patience, not his fault either. Patient can pick up clonazepam at corner of [address], [address]. Inconvenient for this elderly patient to drive across town and she may have to get just a few pills and then go back to the other pharmacy Monday.  Can we give a 90 day supply of clonazepam (is that allowed with this scheduled drug) or can we help them find out if they have a mail-in pharmacy where they can get all their meds mailed with 65-month supply? I know we usually tell patients to try and do it for themselves but if we could help these 80 year old women it would be a great kindness for them and in the  long-term cause less work for [place]. I'd ask for Dr. Bonnita Hollow pod to try and help them with a solution possibly mail order or give them a 90-day supply if that is allowed or whatever you can do to help thanks.  Thanks, dr. Lucia Gaskins

## 2021-11-04 MED ORDER — CLONAZEPAM 0.5 MG PO TABS: ORAL_TABLET | ORAL | 1 refills | Status: DC

## 2021-11-04 NOTE — Addendum Note (Signed)
Addended by: Shawnie Dapper L on: 11/04/2021 02:36 PM   Modules accepted: Orders

## 2021-11-04 NOTE — Telephone Encounter (Signed)
Pt's husband called stating they don't have a Mail order pharmacy to use but want to set it up. Yes they do want the clonazePAM (KLONOPIN) 0.5 MG tablet to be a mail order.

## 2021-11-04 NOTE — Addendum Note (Signed)
Addended by: Judi Cong on: 11/04/2021 01:09 PM   Modules accepted: Orders

## 2021-11-04 NOTE — Telephone Encounter (Signed)
Called the patient because the NP wanted [place] to check in with the patient to see if she would like for [place] to pursue mail order option for her medication. I don't see a mail order pharmacy listed on file and so I call the patient to ask if she has a mail order pharmacy. If so we can start to send a 90 day supply for her to either the mail service pharmacy or the local pharmacy,(whichever the pt prefers.)  "Can you help her figure out if mail order is an option? 90 day script could also be helpful. TY. "   **When the pt calls back, please ask if she has any of her medications that go through mail service pharmacy. If so what mail order pharmacy does she use? Also ask if she would like for [place] to start sending her clonazepam to mail order. Either way we can do a 90 day supply just need to know if she would like that to continue to a local pharmacy of a mail order pharmacy

## 2021-11-12 DIAGNOSIS — F01511 Vascular dementia, unspecified severity, with agitation: Secondary | ICD-10-CM | POA: Diagnosis not present

## 2021-11-12 DIAGNOSIS — I10 Essential (primary) hypertension: Secondary | ICD-10-CM | POA: Diagnosis not present

## 2021-11-12 DIAGNOSIS — F518 Other sleep disorders not due to a substance or known physiological condition: Secondary | ICD-10-CM | POA: Diagnosis not present

## 2021-11-12 DIAGNOSIS — E1169 Type 2 diabetes mellitus with other specified complication: Secondary | ICD-10-CM | POA: Diagnosis not present

## 2021-11-12 DIAGNOSIS — M130 Polyarthritis, unspecified: Secondary | ICD-10-CM | POA: Diagnosis not present

## 2021-11-12 DIAGNOSIS — G464 Cerebellar stroke syndrome: Secondary | ICD-10-CM | POA: Diagnosis not present

## 2021-11-22 ENCOUNTER — Ambulatory Visit: Payer: Medicare PPO | Admitting: Cardiovascular Disease

## 2021-11-26 ENCOUNTER — Encounter: Payer: Self-pay | Admitting: Podiatry

## 2021-11-26 ENCOUNTER — Ambulatory Visit: Payer: Medicare PPO | Admitting: Podiatry

## 2021-11-26 ENCOUNTER — Other Ambulatory Visit: Payer: Self-pay

## 2021-11-26 DIAGNOSIS — I739 Peripheral vascular disease, unspecified: Secondary | ICD-10-CM

## 2021-11-26 DIAGNOSIS — Q828 Other specified congenital malformations of skin: Secondary | ICD-10-CM

## 2021-11-26 DIAGNOSIS — M79675 Pain in left toe(s): Secondary | ICD-10-CM

## 2021-11-26 DIAGNOSIS — B351 Tinea unguium: Secondary | ICD-10-CM

## 2021-11-26 DIAGNOSIS — M79674 Pain in right toe(s): Secondary | ICD-10-CM

## 2021-11-26 NOTE — Progress Notes (Addendum)
This patient returns to my office for at risk foot care.  This patient requires this care by a professional since this patient will be at risk due to having PAD, PVD.  This patient has painful callus on her left forefoot.  This callus is painful walking and wearing her shoes.  This patient presents for at risk foot care today.  General Appearance  Alert, conversant and in no acute stress.  Vascular  Dorsalis pedis and posterior tibial  pulses are weakly  palpable  bilaterally.  Capillary return is within normal limits  bilaterally. Temperature is within normal limits  bilaterally.  Neurologic  Senn-Weinstein monofilament wire test within normal limits  bilaterally. Muscle power within normal limits bilaterally.  Nails Thick disfigured discolored nails with subungual debris  from hallux to fifth toes bilaterally. No evidence of bacterial infection or drainage bilaterally.  Orthopedic  No limitations of motion  feet .  No crepitus or effusions noted.  No bony pathology or digital deformities noted.  Skin  normotropic skin noted bilaterally.  No signs of infections or ulcers noted.   Porokeratosis sub 3 left foot.    Porokeratosis sub 3 left foot.   Consent was obtained for treatment procedures.   Debridement of porokeratosis with # 15 blade.  Husband inquired about possible surgery.   Return office visit    9 weeks                  Told patient to return for periodic foot care and evaluation due to potential at risk complications.  Padding dispensed for dispersion padding.   Helane Gunther DPM

## 2021-12-04 ENCOUNTER — Other Ambulatory Visit: Payer: Self-pay

## 2021-12-04 ENCOUNTER — Ambulatory Visit (HOSPITAL_COMMUNITY)
Admission: RE | Admit: 2021-12-04 | Discharge: 2021-12-04 | Disposition: A | Discharge status: Home / Self Care | Payer: Medicare PPO | Source: Ambulatory Visit | Attending: Internal Medicine | Admitting: Internal Medicine

## 2021-12-04 DIAGNOSIS — I739 Peripheral vascular disease, unspecified: Secondary | ICD-10-CM

## 2021-12-23 ENCOUNTER — Other Ambulatory Visit: Payer: Self-pay | Admitting: Family

## 2021-12-23 DIAGNOSIS — E782 Mixed hyperlipidemia: Secondary | ICD-10-CM

## 2021-12-23 DIAGNOSIS — I25118 Atherosclerotic heart disease of native coronary artery with other forms of angina pectoris: Secondary | ICD-10-CM

## 2021-12-26 NOTE — Progress Notes (Signed)
Chief Complaint  Patient presents with   Follow-up    CAD    History of Present Illness: 81 yo female with history of CAD, HTN, HLD, COPD, prior TIA, dementia and PAD who is here today for cardiac follow up. She had an inferior STEMI in 2011 treated with 3 drug eluting stents in the proximal, mid and distal RCA. She is known to have anomalous takeoff of all coronaries. Nuclear stress test in October 2014 with no ischemia. She has PAD with abnormal LE arterial dopplers since May 2011. She was seen in 2011 by Dr. Hart RochesterLawson but has had serial LE arterial testing in our office since. [facility] by Dr. Allyson SabalBerry December 2018 and he felt that her toe pain was not vascular in nature. He planned [facility]. ABI December 2022 unchanged. She was seen in the ED June 2022 with chest pain. Troponin negative. Echo June 2022 with LVEF=60-65%. No significant valve disease. Her chest pain was not felt to be cardiac related. Protonix was added but she did not take this.   She is here today for follow up. The patient denies any chest pain, dyspnea, palpitations, lower extremity edema, orthopnea, PND, dizziness, near syncope or syncope.   Primary Care Physician: Renaye RakersBland, Veita, MD  Past Medical History:  Diagnosis Date   COPD (chronic obstructive pulmonary disease) (HCC)    Hard of hearing    History of anemia    History of blood transfusion    History of pneumonia    HTN (hypertension)    Hyperlipidemia    Hyperthyroidism    treated wtih radioactive iodine   OA (osteoarthritis of spine)    PVD (peripheral vascular disease) (HCC)    Lower extremity Dopplers  May 2revealed an ABI of 0.83 in the right posterior    Shortness of breath dyspnea    STEMI (ST elevation myocardial infarction) [facility](HCC) May 2011   Promus DES in the proximal, mid and distal RCA in May 2011   Stroke [facility](HCC)    Urinary frequency    Urinary urgency     Past Surgical History:  Procedure Laterality  Date   ABDOMINAL HYSTERECTOMY     Had ovarian resection and required lysis of adhesions   CARDIAC CATHETERIZATION  04/07/2010   EF 60-70%   CORONARY STENTS     x3   ESOPHAGOGASTRODUODENOSCOPY N/A 03/10/2015   Procedure: ESOPHAGOGASTRODUODENOSCOPY (EGD);  Surgeon: Wandalee FerdinandSam Ganem, MD;  [facility];  Service: Endoscopy;  Laterality: N/A;   OVARY SURGERY     TOTAL HIP ARTHROPLASTY  2004   left   TOTAL HIP ARTHROPLASTY Right 01/23/2016   Procedure: RIGHT TOTAL HIP ARTHROPLASTY ANTERIOR APPROACH;  Surgeon: Loreta Aveaniel F Murphy, MD;  [facility];  Service: Orthopedics;  Laterality: Right;    Current Outpatient Medications  Medication Sig Dispense Refill   aspirin EC 81 MG tablet Take 81 mg by mouth daily.     atorvastatin (LIPITOR) 80 MG tablet Take 1 tablet (80 mg total) by mouth daily. Patient is overdue for followup, please call our office to schedule an appointment to ensure further refills! 30 tablet 1   BREZTRI AEROSPHERE 160-9-4.8 MCG/ACT AERO Inhale 2 puffs into the lungs 2 (two) times daily.     clonazePAM (KLONOPIN) 0.5 MG tablet TAKE 1/2 TO 1 TABLET BY MOUTH EVERY NIGHT AT BEDTIME 90 tablet 1   cyclobenzaprine (FLEXERIL) 5 MG tablet Take 1-2 tablets (5-10 mg total) by  mouth 2 (two) times daily as needed for muscle spasms. 30 tablet 0   ezetimibe (ZETIA) 10 MG tablet Take 1 tablet (10 mg total) by mouth daily. 90 tablet 2   hydrALAZINE (APRESOLINE) 25 MG tablet TAKE 1 TABLET(25 MG) BY MOUTH DAILY. 90 tablet 3   hydrochlorothiazide (HYDRODIURIL) 25 MG tablet Take 1 tablet (25 mg total) by mouth daily. 90 tablet 2   lidocaine (LIDODERM) 5 % Place 1 patch onto the skin daily. Remove & Discard patch within 12 hours or as directed by MD 30 patch 0   melatonin 3 MG TABS tablet Take 3 mg by mouth at bedtime as needed (sleep).     methimazole (TAPAZOLE) 5 MG tablet Take 5 mg by mouth daily.     metoprolol succinate (TOPROL-XL) 50 MG 24 hr tablet Take 1 tablet (50 mg total) by mouth daily. 90  tablet 2   nitroGLYCERIN (NITROSTAT) 0.3 MG SL tablet Place 0.3 mg under the tongue every 5 (five) minutes as needed for chest pain.     No current facility-administered medications for this visit.    Allergies  Allergen Reactions   Ace Inhibitors Swelling    Angioedema of the tongue   Iodinated Contrast Media Anaphylaxis   Other Anaphylaxis    Allergic to melons, nuts   Shellfish-Derived Products Anaphylaxis   Hydrocodone Other (See Comments)    unspecified    Social History   Socioeconomic History   Marital status: Married    Spouse name: Cabin crew   Number of children: 3   Years of education: Masters   Highest education level: Not on file  Occupational History   Occupation: Retired  Tobacco Use   Smoking status: Former    Packs/day: 1.00    Years: 45.00    Pack years: 45.00    Types: Cigarettes    Quit date: 04/07/2010    Years since quitting: 11.7   Smokeless tobacco: Never  Vaping Use   Vaping Use: Never used  Substance and Sexual Activity   Alcohol use: Yes    Comment: rarely    Drug use: No   Sexual activity: Not on file  Other Topics Concern   Not on file  Social History Narrative   Lives with husband, daughter and her son   Right handed   Tea sometimes, decaf coffee   Social Determinants of Health   Financial Resource Strain: Not on file  Food Insecurity: Not on file  Transportation Needs: Not on file  Physical Activity: Not on file  Stress: Not on file  Social Connections: Not on file  Intimate Partner Violence: Not on file    Family History  Problem Relation Age of Onset   Peripheral vascular disease Mother    Congestive Heart Failure Mother    Heart attack Father     Review of Systems:  As stated in the HPI and otherwise negative.   BP 118/68    Pulse 78    Ht 5\' 2"  (1.575 m)    Wt 108 lb 6.4 oz (49.2 kg)    SpO2 95%    BMI 19.83 kg/m   Physical Examination:  General: Well developed, well nourished, NAD  HEENT: OP clear, mucus  membranes moist  SKIN: warm, dry. No rashes. Neuro: No focal deficits  Musculoskeletal: Muscle strength 5/5 all ext  Psychiatric: Mood and affect normal  Neck: No JVD, no carotid bruits, no thyromegaly, no lymphadenopathy.  Lungs:Clear bilaterally, no wheezes, rhonci, crackles Cardiovascular: Regular rate and rhythm.  No murmurs, gallops or rubs. Abdomen:Soft. Bowel sounds present. Non-tender.  Extremities: No lower extremity edema. Pulses are 2 + in the bilateral DP/PT.  EKG:   Recent Labs: 05/14/2021: ALT 17; BUN 18; Magnesium 1.8; Potassium 3.2; Sodium 138 05/15/2021: Creatinine, Ser 0.81; Hemoglobin 12.4; Platelets 271   Lipid Panel Lipid Panel     Component Value Date/Time   CHOL 155 08/31/2019 0000   TRIG 51 08/31/2019 0000   HDL 59 08/31/2019 0000   CHOLHDL 2.6 08/31/2019 0000   CHOLHDL 3 07/31/2014 1153   VLDL 20.6 07/31/2014 1153   LDLCALC 85 08/31/2019 0000   LDLDIRECT 138.9 07/23/2011 0815   LABVLDL 11 08/31/2019 0000     Wt Readings from Last 3 Encounters:  12/27/21 108 lb 6.4 oz (49.2 kg)  10/14/21 105 lb 8 oz (47.9 kg)  09/15/21 105 lb 14.4 oz (48 kg)     Other studies Reviewed: Additional studies/ records that were reviewed today include: . Review of the above records demonstrates:    Assessment and Plan:   1. PAD: No change in LE pain. ABI stable December 2022   2. CAD without angina: No chest pain c/w angina. Will continue ASA, beta blocker and statin.    3. Hyperlipidemia: LDL near goal in November 1610. . Will continue Zetia and statin.    4. HTN: BP stable.   Current medicines are reviewed at length with the patient today.  The patient does not have concerns regarding medicines.  The following changes have been made:  no change  Labs/ tests ordered today include:   No orders of the defined types were placed in this encounter.   Disposition:   F/U with me in 12  months  Signed, Verne Carrow, MD 12/27/2021 12:30 PM    [facility] [address] [place], [place], [place]  [address] Phone: [phone]; Fax: [phone]

## 2021-12-27 ENCOUNTER — Other Ambulatory Visit: Payer: Self-pay

## 2021-12-27 ENCOUNTER — Encounter: Payer: Self-pay | Admitting: Cardiovascular Disease

## 2021-12-27 ENCOUNTER — Ambulatory Visit: Payer: Medicare PPO | Admitting: Cardiovascular Disease

## 2021-12-27 VITALS — BP 118/68 | HR 78 | Ht 62.0 in | Wt 108.4 lb

## 2021-12-27 DIAGNOSIS — I739 Peripheral vascular disease, unspecified: Secondary | ICD-10-CM | POA: Diagnosis not present

## 2021-12-27 DIAGNOSIS — I2510 Atherosclerotic heart disease of native coronary artery without angina pectoris: Secondary | ICD-10-CM | POA: Diagnosis not present

## 2021-12-27 DIAGNOSIS — E782 Mixed hyperlipidemia: Secondary | ICD-10-CM | POA: Diagnosis not present

## 2021-12-27 DIAGNOSIS — I10 Essential (primary) hypertension: Secondary | ICD-10-CM | POA: Diagnosis not present

## 2021-12-27 NOTE — Patient Instructions (Signed)

## 2022-01-02 ENCOUNTER — Other Ambulatory Visit: Payer: Self-pay

## 2022-01-02 ENCOUNTER — Telehealth: Payer: Self-pay | Admitting: Cardiovascular Disease

## 2022-01-02 ENCOUNTER — Other Ambulatory Visit: Payer: Self-pay | Admitting: Neurology

## 2022-01-02 MED ORDER — NITROGLYCERIN 0.3 MG SL SUBL: 0.3000 mg | SUBLINGUAL_TABLET | SUBLINGUAL | 3 refills | Status: DC | PRN

## 2022-01-02 NOTE — Telephone Encounter (Signed)
Called patient and per request left a detailed message stating that once opened, the bottle should be replaced every 6 months. Unopened bottles can be held until expiration date on bottle. Also stated that if tablets are chipped, beginning to breakdown into powder, or in doubt that it's best to just toss and get fresh bottle. Instructed pt to call back with any additional questions or if more clarification is needed. Maralyn Sago, RN

## 2022-01-02 NOTE — Telephone Encounter (Signed)
Received refill request for clonazepam.  Last OV was on 10/14/21.  Next OV is scheduled for 06/02/22 .  Last RX was written on 11/26/21 for 30 tabs.   [place] [facility] has been reviewed.

## 2022-01-02 NOTE — Telephone Encounter (Signed)
Pt c/o medication issue:  1. Name of Medication: nitroGLYCERIN (NITROSTAT) 0.3 MG SL tablet  2. How are you currently taking this medication (dosage and times per day)? As needed  3. Are you having a reaction (difficulty breathing--STAT)? no  4. What is your medication issue? [name] states the patient has had the medication since June and wants to know if it is expired. He says he will be going back to work and to leave a detailed voicemail on his home number: [phone].

## 2022-01-28 ENCOUNTER — Ambulatory Visit: Payer: Medicare PPO | Admitting: Podiatry

## 2022-01-28 ENCOUNTER — Encounter: Payer: Self-pay | Admitting: Podiatry

## 2022-01-28 ENCOUNTER — Other Ambulatory Visit: Payer: Self-pay

## 2022-01-28 DIAGNOSIS — B351 Tinea unguium: Secondary | ICD-10-CM

## 2022-01-28 DIAGNOSIS — I739 Peripheral vascular disease, unspecified: Secondary | ICD-10-CM | POA: Diagnosis not present

## 2022-01-28 DIAGNOSIS — Q828 Other specified congenital malformations of skin: Secondary | ICD-10-CM

## 2022-01-28 DIAGNOSIS — M79675 Pain in left toe(s): Secondary | ICD-10-CM

## 2022-01-28 DIAGNOSIS — M79674 Pain in right toe(s): Secondary | ICD-10-CM

## 2022-01-28 NOTE — Progress Notes (Signed)
This patient returns to my office for at risk foot care.  This patient requires this care by a professional since this patient will be at risk due to having PAD, PVD.  This patient has painful callus on her left forefoot.  This callus is painful walking and wearing her shoes.  This patient presents for at risk foot care today.  General Appearance  Alert, conversant and in no acute stress.  Vascular  Dorsalis pedis and posterior tibial  pulses are weakly  palpable  bilaterally.  Capillary return is within normal limits  bilaterally. Temperature is within normal limits  bilaterally.  Neurologic  Senn-Weinstein monofilament wire test within normal limits  bilaterally. Muscle power within normal limits bilaterally.  Nails Thick disfigured discolored nails with subungual debris  from hallux to fifth toes bilaterally. No evidence of bacterial infection or drainage bilaterally.  Orthopedic  No limitations of motion  feet .  No crepitus or effusions noted.  No bony pathology or digital deformities noted.  Skin  normotropic skin noted bilaterally.  No signs of infections or ulcers noted.   Porokeratosis sub 3 left foot.    Porokeratosis sub 3 left foot.   Consent was obtained for treatment procedures.   Debridement of porokeratosis with # 15 blade.  Husband inquired about possible surgery.   Return office visit    9 weeks                  Told patient to return for periodic foot care and evaluation due to potential at risk complications.  Padding dispensed for dispersion padding.   Helane Gunther DPM

## 2022-02-05 ENCOUNTER — Encounter: Payer: Self-pay | Admitting: Physical Medicine and Rehabilitation

## 2022-02-05 ENCOUNTER — Other Ambulatory Visit: Payer: Self-pay

## 2022-02-05 ENCOUNTER — Encounter: Payer: Medicare PPO | Attending: Physical Medicine and Rehabilitation | Admitting: Physical Medicine and Rehabilitation

## 2022-02-05 VITALS — BP 111/72 | HR 83 | Ht 62.0 in | Wt 107.0 lb

## 2022-02-05 DIAGNOSIS — M5416 Radiculopathy, lumbar region: Secondary | ICD-10-CM

## 2022-02-05 DIAGNOSIS — M5442 Lumbago with sciatica, left side: Secondary | ICD-10-CM

## 2022-02-05 DIAGNOSIS — G8929 Other chronic pain: Secondary | ICD-10-CM

## 2022-02-05 MED ORDER — DULOXETINE HCL 20 MG PO CPEP: 20.0000 mg | ORAL_CAPSULE | Freq: Every day | ORAL | 2 refills | Status: DC

## 2022-02-05 MED ORDER — CYCLOBENZAPRINE HCL 5 MG PO TABS: 5.0000 mg | ORAL_TABLET | Freq: Every evening | ORAL | 2 refills | Status: DC | PRN

## 2022-02-05 NOTE — Progress Notes (Signed)
? ?Subjective:  ? ? Patient ID: Kristine [name], female    DOB: [birth year], 81 y.o.   MRN: [id number] ? ?HPI ?Pt is a 81 yr old female with hx of L THR in 2017- R THR in ~ 2015 or earlier ; asthma, COPD; CAD s/p stents; hx of stroke; HTN, and HLD; chronic low back pain.  ? ?Here for evaluation of chronic low back pain.  ? ?Starts in L low back and radiates to L knee posteriorly.  ?Sometimes starts in L buttock- and also will radiate across low back to R side ? ?Sharp, stabbing- like a flash of lightning-  ?And moves up and over.  Is frequent, but not constant-  ?No low level throbbing pain.  ? ?Lasts just long enough to say "ow". Usually just once- and then it's gone; but can occur 5-10x/day.  ?Depends on when getting up and down and esp when getting in/out of bed.  ?Movement sets it off- esp flexion of lumbar spine.  ? ?Now has trouble with sweeping and mopping;  ? ? ?Tried: ?No prescription to help ?Did PT for hip replacement ?Tylenol- it helps-  ?Can go the whole day ~ 12 hours without taking again.  ?Flexeril helped- took just at night- but did help the pain -doesn't know if made sleepy. Has run out of it.  ? ? ?Larey Seat last year- fell on the seat above toilet-  ?Stirred up the pain/more.  And then required to do PT for ~ 9 sessions. Got better with therapy.  ? ?Social Hx: ?Has elevated /raised toilet seat ? ? ?Allergies; Norco ? ? ?Xrays done of lumbar spine in 10/22 show some scoliosis, but nothing specific otherwise and hip xrays show THR B/L, but no issues.  ? ? ?Pain Inventory ?Average Pain 5 ?Pain Right Now 3 ?My pain is intermittent, sharp, and stabbing ? ?In the last 24 hours, has pain interfered with the following? ?General activity 6 ?Relation with others 6 ?Enjoyment of life 5 ?What TIME of day is your pain at its worst? evening ?Sleep (in general) Good ? ?Pain is worse with: bending ?Pain improves with: medication ?Relief from Meds: 9 ? ?MOBILITY ?Walks with a cane ?Climbs steps ?Restricted  driving ? ?FUNCTION ?Retired ? ?NEURO/PSYCH ?Bladder Control Problem ?Confusion ? ?Family History  ?Problem Relation Age of Onset  ? Peripheral vascular disease Mother   ? Congestive Heart Failure Mother   ? Heart attack Father   ? ?Social History  ? ?Socioeconomic History  ? Marital status: Married  ?  Spouse name: Colen  ? Number of children: 3  ? Years of education: Masters  ? Highest education level: Not on file  ?Occupational History  ? Occupation: Retired  ?Tobacco Use  ? Smoking status: Former  ?  Packs/day: 1.00  ?  Years: 45.00  ?  Pack years: 45.00  ?  Types: Cigarettes  ?  Quit date: 04/07/2010  ?  Years since quitting: 11.8  ? Smokeless tobacco: Never  ?Vaping Use  ? Vaping Use: Never used  ?Substance and Sexual Activity  ? Alcohol use: Yes  ?  Comment: rarely   ? Drug use: No  ? Sexual activity: Not on file  ?Other Topics Concern  ? Not on file  ?Social History Narrative  ? Lives with husband, daughter and her son  ? Right handed  ? Tea sometimes, decaf coffee  ? ?Social Determinants of Health  ? ?Financial Resource Strain: Not on file  ?Food Insecurity: Not  on file  ?Transportation Needs: Not on file  ?Physical Activity: Not on file  ?Stress: Not on file  ?Social Connections: Not on file  ? ?Past Surgical History:  ?Procedure Laterality Date  ? ABDOMINAL HYSTERECTOMY    ? Had ovarian resection and required lysis of adhesions  ? CARDIAC CATHETERIZATION  04/07/2010  ? EF 60-70%  ? CORONARY STENTS    ? x3  ? ESOPHAGOGASTRODUODENOSCOPY N/A 03/10/2015  ? Procedure: ESOPHAGOGASTRODUODENOSCOPY (EGD);  Surgeon: Wandalee Ferdinand, MD;  Location: [facility] ENDOSCOPY;  Service: Endoscopy;  Laterality: N/A;  ? OVARY SURGERY    ? TOTAL HIP ARTHROPLASTY  2004  ? left  ? TOTAL HIP ARTHROPLASTY Right 01/23/2016  ? Procedure: RIGHT TOTAL HIP ARTHROPLASTY ANTERIOR APPROACH;  Surgeon: Loreta Ave, MD;  Location: [facility] OR;  Service: Orthopedics;  Laterality: Right;  ? ?Past Surgical History:  ?Procedure Laterality Date  ? ABDOMINAL  HYSTERECTOMY    ? Had ovarian resection and required lysis of adhesions  ? CARDIAC CATHETERIZATION  04/07/2010  ? EF 60-70%  ? CORONARY STENTS    ? x3  ? ESOPHAGOGASTRODUODENOSCOPY N/A 03/10/2015  ? Procedure: ESOPHAGOGASTRODUODENOSCOPY (EGD);  Surgeon: Wandalee Ferdinand, MD;  Location: [facility] ENDOSCOPY;  Service: Endoscopy;  Laterality: N/A;  ? OVARY SURGERY    ? TOTAL HIP ARTHROPLASTY  2004  ? left  ? TOTAL HIP ARTHROPLASTY Right 01/23/2016  ? Procedure: RIGHT TOTAL HIP ARTHROPLASTY ANTERIOR APPROACH;  Surgeon: Loreta Ave, MD;  Location: [facility] OR;  Service: Orthopedics;  Laterality: Right;  ? ?Past Medical History:  ?Diagnosis Date  ? COPD (chronic obstructive pulmonary disease) (HCC)   ? Hard of hearing   ? History of anemia   ? History of blood transfusion   ? History of pneumonia   ? HTN (hypertension)   ? Hyperlipidemia   ? Hyperthyroidism   ? treated wtih radioactive iodine  ? OA (osteoarthritis of spine)   ? PVD (peripheral vascular disease) (HCC)   ? Lower extremity Dopplers  May 2revealed an ABI of 0.83 in the right posterior   ? Shortness of breath dyspnea   ? STEMI (ST elevation myocardial infarction) [facility]) May 2011  ? Promus DES in the proximal, mid and distal RCA in May 2011  ? Stroke [facility])   ? Urinary frequency   ? Urinary urgency   ? ?There were no vitals taken for this visit. ? ?Opioid Risk Score:   ?Fall Risk Score:  `1 ? ?Depression screen PHQ 2/9 ? ?No flowsheet data found. ? ?Review of Systems  ?Genitourinary:  Positive for frequency.  ?Musculoskeletal:  Positive for back pain and gait problem.  ?     Left leg & left groin pain  ?Psychiatric/Behavioral:  Positive for confusion.   ?All other systems reviewed and are negative. ? ?   ?Objective:  ? Physical Exam ?Awake, alert, appropriate, spry woman appears younger than stated age, accompanied by husband, NAD; walks with single point cane ? ?MS: ?5/5 in HF, KE, KF and DF B/L; and PF 4+/5 B/L ?(-) SLR B/L ?No increased pain with lumbar flexion or extension or  rotation B/L  ? ?Neuro: ?Intact to light touch in all 4 extremities B/L  ?1+ DTRS at achilles and patella B/L  ? ?Gait:  slightly decreased arm swing and some shuffling gait- also has toes points outward somewhat L>R ? ?L 2nd digit /MCP is very enlarged compared to R side and other knuckles- appears to be the joint- not ganglion cyst.  ? ? ? ?   ?  Assessment & Plan:  ? ?Pt is a 81 yr old female with hx of L THR in 2017; asthma, COPD; CAD s/p stents; hx of stroke- no weakness; HTN, and HLD; chronic low back pain.  ? ?Here for evaluation of chronic low back pain.  ?Likely Lumbar radiculopathy due to arthritis. Base don exam. No red flags.  ? ? Nerve pain- goal to decrease frequency and intensity of pain ?- Start Duloxetine/ ?Cymbalta- for nerve pain- 20 mg daily x 1 week ?Then 40 mg daily x 1 week  ? Then 60 mg daily- - for nerve pain ?1% of patients can have nausea with Duloxetine- call me if needs an anti-nausea medicine. ?Can also cause mild dry mouth/dry eyes and mild constipation. ? - if it makes sleepy, can take at night ? ?2. Don't want to get MRI since will not be doing surgery. Won't change management ? ?3. Flexeril/Cyclobenzaprine- 5-10 mg nightly as needed #60 2 refills.  ? ?4.  Will wait on physical therapy- for now- might need to ad din.  ? ? ?5. Call me in 1 month to let me know how things going. ? ?6. F/U in 3 months  ? ?I spent a total of  36  minutes on total care today- >50% coordination of care- due to education on plan as detailed above  ? ?

## 2022-02-05 NOTE — Patient Instructions (Signed)
Pt is a 81 yr old female with hx of L THR in 2017; asthma, COPD; CAD s/p stents; hx of stroke- no weakness; HTN, and HLD; chronic low back pain.  ? ?Here for evaluation of chronic low back pain.  ?Likely Lumbar radiculopathy due to arthritis. Base don exam.  ? ? Nerve pain- goal to decrease frequency and intensity of pain ?- Start Duloxetine/ ?Cymbalta- for nerve pain- 20 mg daily x 1 week ?Then 40 mg daily x 1 week  ? Then 60 mg daily- - for nerve pain ?1% of patients can have nausea with Duloxetine- call me if needs an anti-nausea medicine. ?Can also cause mild dry mouth/dry eyes and mild constipation. ? - if it makes sleepy, can take at night ? ?2. Don't want to get MRI since will not be doing surgery. Won't change management ? ?3. Flexeril/Cyclobenzaprine- 5-10 mg nightly as needed #60 2 refills.  ? ?4.  Will wait on physical therapy- for now- might need to ad din.  ? ? ?5. Call me in 1 month to let me know how things going. ? ?6. F/U in 3 months  ?

## 2022-02-10 DIAGNOSIS — M545 Low back pain, unspecified: Secondary | ICD-10-CM | POA: Diagnosis not present

## 2022-02-10 DIAGNOSIS — I10 Essential (primary) hypertension: Secondary | ICD-10-CM | POA: Diagnosis not present

## 2022-02-10 DIAGNOSIS — Z681 Body mass index (BMI) 19 or less, adult: Secondary | ICD-10-CM | POA: Diagnosis not present

## 2022-02-10 DIAGNOSIS — F01511 Vascular dementia, unspecified severity, with agitation: Secondary | ICD-10-CM | POA: Diagnosis not present

## 2022-02-10 DIAGNOSIS — I739 Peripheral vascular disease, unspecified: Secondary | ICD-10-CM | POA: Diagnosis not present

## 2022-02-10 DIAGNOSIS — H908 Mixed conductive and sensorineural hearing loss, unspecified: Secondary | ICD-10-CM | POA: Diagnosis not present

## 2022-02-10 DIAGNOSIS — E1169 Type 2 diabetes mellitus with other specified complication: Secondary | ICD-10-CM | POA: Diagnosis not present

## 2022-02-10 DIAGNOSIS — I639 Cerebral infarction, unspecified: Secondary | ICD-10-CM | POA: Diagnosis not present

## 2022-02-24 ENCOUNTER — Other Ambulatory Visit: Payer: Self-pay | Admitting: Family

## 2022-02-24 DIAGNOSIS — E782 Mixed hyperlipidemia: Secondary | ICD-10-CM

## 2022-02-24 DIAGNOSIS — I25118 Atherosclerotic heart disease of native coronary artery with other forms of angina pectoris: Secondary | ICD-10-CM

## 2022-02-24 NOTE — Telephone Encounter (Signed)
Rx(s) sent to pharmacy electronically.  

## 2022-02-25 ENCOUNTER — Telehealth: Payer: Self-pay | Admitting: Physical Medicine and Rehabilitation

## 2022-02-25 ENCOUNTER — Telehealth: Payer: Self-pay

## 2022-02-25 NOTE — Telephone Encounter (Signed)
Patient husband  would like a call regarding wife pain in leg from knee to groin area on left leg. Really complaining about it this morning he gave her medicine that they had from her back pain it was topical ointment  ?

## 2022-02-25 NOTE — Telephone Encounter (Signed)
[name] called to inquire what to do now that she is up to 3 capsules a day with the Duloxetine. Informed him that she is to continue to take 3 capsules a day. He also stated the PCP does not want her taking the Cyclobenzaprine because it can alter mental state. He wants a call back from Dr. Dagoberto Ligas. ?

## 2022-03-10 ENCOUNTER — Telehealth: Payer: Self-pay

## 2022-03-10 NOTE — Telephone Encounter (Signed)
[name] called  back as advised on last visit. Kristine [name] pain only comes and goes now. She is doing much better. ? ?You can reach [name] on [phone]. ? ?

## 2022-03-21 ENCOUNTER — Telehealth: Payer: Self-pay

## 2022-03-21 NOTE — Telephone Encounter (Signed)
[name] called to see if he needs to get the Duloxetine refilled because the pharmacy called to get it refilled. Informed him to call the pharmacy and get them to refill it.  He stated she has not needed it often but didn't have anymore. Advised him once again to get the pharmacy to refill it and to have it on hand for her if she needs it ?

## 2022-03-29 ENCOUNTER — Other Ambulatory Visit: Payer: Self-pay | Admitting: Neurology

## 2022-03-31 ENCOUNTER — Telehealth: Payer: Self-pay | Admitting: Neurology

## 2022-03-31 NOTE — Telephone Encounter (Signed)
Pt husband(on DPR) requesting to speak with a nurse. ? Since she has started taking melatonin and clonazePAM (KLONOPIN) 0.5 MG tablet her cognitive issues have gotten worse. Pt husband would like a call back regarding if pt should keep taking these medications.  ?[phone] ?

## 2022-03-31 NOTE — Telephone Encounter (Signed)
Called the patient's husband back. Pt is having some mental status changes and some changes with gait. Advised that that she has been on this medications for over a year now. Its not likely this new symptoms are being caused by a medication that she has been on for a while. I asked if she had any symptoms of UTI because these can sometimes cause altered mental status changes and the husband states she has not said anything. I advised that she really should be evaluated by primary care first to see what is going on and if there is something causing these changes. Pt's husband verbalized understanding and states he will contact pt's PCP ?

## 2022-04-01 ENCOUNTER — Telehealth: Payer: Self-pay | Admitting: Neurology

## 2022-04-01 ENCOUNTER — Ambulatory Visit: Payer: Medicare PPO | Admitting: Podiatry

## 2022-04-01 DIAGNOSIS — I10 Essential (primary) hypertension: Secondary | ICD-10-CM | POA: Diagnosis not present

## 2022-04-01 DIAGNOSIS — F01511 Vascular dementia, unspecified severity, with agitation: Secondary | ICD-10-CM | POA: Diagnosis not present

## 2022-04-01 DIAGNOSIS — G464 Cerebellar stroke syndrome: Secondary | ICD-10-CM | POA: Diagnosis not present

## 2022-04-01 DIAGNOSIS — R634 Abnormal weight loss: Secondary | ICD-10-CM | POA: Diagnosis not present

## 2022-04-01 DIAGNOSIS — E1169 Type 2 diabetes mellitus with other specified complication: Secondary | ICD-10-CM | POA: Diagnosis not present

## 2022-04-01 DIAGNOSIS — E079 Disorder of thyroid, unspecified: Secondary | ICD-10-CM | POA: Diagnosis not present

## 2022-04-01 NOTE — Telephone Encounter (Signed)
Sent request to Dr. Epimenio Foot to send in refill for pt. ?

## 2022-04-01 NOTE — Telephone Encounter (Signed)
Pt husband calling says pt needs  clonazePAM (KLONOPIN) 0.5 MG tablet  refill today. I informed pt request was pending.  ?He states his wife needs the medication today 4/25 because she is out.  ? Pt would like the refill sent to [id number].  ?

## 2022-04-02 ENCOUNTER — Ambulatory Visit: Payer: Medicare PPO | Admitting: Podiatry

## 2022-04-02 ENCOUNTER — Other Ambulatory Visit: Payer: Self-pay

## 2022-04-02 ENCOUNTER — Emergency Department (HOSPITAL_COMMUNITY)
Admission: EM | Admit: 2022-04-02 | Discharge: 2022-04-02 | Disposition: A | Discharge status: Home / Self Care | Payer: Medicare PPO | Attending: Emergency Medicine | Admitting: Emergency Medicine

## 2022-04-02 ENCOUNTER — Emergency Department (HOSPITAL_COMMUNITY): Payer: Medicare PPO

## 2022-04-02 ENCOUNTER — Encounter: Payer: Self-pay | Admitting: Podiatry

## 2022-04-02 ENCOUNTER — Encounter (HOSPITAL_COMMUNITY): Payer: Self-pay | Admitting: Pharmacy Technician

## 2022-04-02 DIAGNOSIS — E1169 Type 2 diabetes mellitus with other specified complication: Secondary | ICD-10-CM | POA: Diagnosis not present

## 2022-04-02 DIAGNOSIS — Z79899 Other long term (current) drug therapy: Secondary | ICD-10-CM | POA: Diagnosis not present

## 2022-04-02 DIAGNOSIS — M79675 Pain in left toe(s): Secondary | ICD-10-CM | POA: Diagnosis not present

## 2022-04-02 DIAGNOSIS — Z7982 Long term (current) use of aspirin: Secondary | ICD-10-CM | POA: Insufficient documentation

## 2022-04-02 DIAGNOSIS — G464 Cerebellar stroke syndrome: Secondary | ICD-10-CM | POA: Diagnosis not present

## 2022-04-02 DIAGNOSIS — M50321 Other cervical disc degeneration at C4-C5 level: Secondary | ICD-10-CM | POA: Diagnosis not present

## 2022-04-02 DIAGNOSIS — B351 Tinea unguium: Secondary | ICD-10-CM

## 2022-04-02 DIAGNOSIS — Q828 Other specified congenital malformations of skin: Secondary | ICD-10-CM | POA: Diagnosis not present

## 2022-04-02 DIAGNOSIS — S0990XA Unspecified injury of head, initial encounter: Secondary | ICD-10-CM | POA: Diagnosis not present

## 2022-04-02 DIAGNOSIS — M542 Cervicalgia: Secondary | ICD-10-CM | POA: Diagnosis not present

## 2022-04-02 DIAGNOSIS — N390 Urinary tract infection, site not specified: Secondary | ICD-10-CM | POA: Diagnosis not present

## 2022-04-02 DIAGNOSIS — G319 Degenerative disease of nervous system, unspecified: Secondary | ICD-10-CM | POA: Diagnosis not present

## 2022-04-02 DIAGNOSIS — E079 Disorder of thyroid, unspecified: Secondary | ICD-10-CM | POA: Diagnosis not present

## 2022-04-02 DIAGNOSIS — M50322 Other cervical disc degeneration at C5-C6 level: Secondary | ICD-10-CM | POA: Diagnosis not present

## 2022-04-02 DIAGNOSIS — R634 Abnormal weight loss: Secondary | ICD-10-CM | POA: Diagnosis not present

## 2022-04-02 DIAGNOSIS — M79674 Pain in right toe(s): Secondary | ICD-10-CM

## 2022-04-02 DIAGNOSIS — I639 Cerebral infarction, unspecified: Secondary | ICD-10-CM | POA: Diagnosis not present

## 2022-04-02 DIAGNOSIS — I10 Essential (primary) hypertension: Secondary | ICD-10-CM | POA: Diagnosis not present

## 2022-04-02 DIAGNOSIS — M25512 Pain in left shoulder: Secondary | ICD-10-CM | POA: Diagnosis not present

## 2022-04-02 DIAGNOSIS — I739 Peripheral vascular disease, unspecified: Secondary | ICD-10-CM | POA: Diagnosis not present

## 2022-04-02 DIAGNOSIS — S3992XA Unspecified injury of lower back, initial encounter: Secondary | ICD-10-CM | POA: Diagnosis not present

## 2022-04-02 DIAGNOSIS — S199XXA Unspecified injury of neck, initial encounter: Secondary | ICD-10-CM | POA: Diagnosis not present

## 2022-04-02 DIAGNOSIS — W19XXXA Unspecified fall, initial encounter: Secondary | ICD-10-CM | POA: Insufficient documentation

## 2022-04-02 DIAGNOSIS — Z043 Encounter for examination and observation following other accident: Secondary | ICD-10-CM | POA: Diagnosis not present

## 2022-04-02 DIAGNOSIS — M810 Age-related osteoporosis without current pathological fracture: Secondary | ICD-10-CM | POA: Diagnosis not present

## 2022-04-02 DIAGNOSIS — I700 Atherosclerosis of aorta: Secondary | ICD-10-CM | POA: Diagnosis not present

## 2022-04-02 NOTE — Discharge Instructions (Signed)
Return for any problem.  ?

## 2022-04-02 NOTE — ED Provider Notes (Signed)
?[facility]-EMERGENCY DEPT ?Provider Note ? ? ?CSN: [id number] ?Arrival date & time: 04/02/22  1804 ? ?  ? ?History ? ?Chief Complaint  ?Patient presents with  ? Fall  ? ? ?Kristine [name] is a 81 y.o. female. ? ?81 year old female with prior medical history as detailed below presents for evaluation.  Patient's family reports that she needs assistance for ambulation.  This afternoon as she was going from the car to her house she had a fall.  She fell onto grass.  She landed on her back.  She did hit her head.  She did not pass out. ? ?She is without complaint of significant pain. ? ?She is not on any anticoagulant medications. ? ?She is at normal baseline mental status. ? ?The history is provided by the patient and medical records.  ?Fall ?This is a new problem. The current episode started 3 to 5 hours ago. The problem occurs rarely. The problem has not changed since onset.Pertinent negatives include no chest pain, no abdominal pain, no headaches and no shortness of breath. Nothing aggravates the symptoms. Nothing relieves the symptoms.  ? ?  ? ?Home Medications ?Prior to Admission medications   ?Medication Sig Start Date End Date Taking? Authorizing Provider  ?aspirin EC 81 MG tablet Take 81 mg by mouth daily.    [provider]  ?atorvastatin (LIPITOR) 80 MG tablet Take 1 tablet (80 mg total) by mouth daily. 02/24/22   Kathleene HazelMcAlhany, Christopher D, MD  ?Markus DaftBREZTRI AEROSPHERE 160-9-4.8 MCG/ACT AERO Inhale 2 puffs into the lungs 2 (two) times daily. 10/17/20   [provider]  ?clonazePAM (KLONOPIN) 0.5 MG tablet TAKE 1/2 TO 1 TABLET BY MOUTH AT BEDTIME 04/01/22   Sater, Pearletha Furlichard A, MD  ?cyclobenzaprine (FLEXERIL) 5 MG tablet Take 1-2 tablets (5-10 mg total) by mouth at bedtime as needed for muscle spasms. 02/05/22   Lovorn, Aundra MilletMegan, MD  ?diclofenac Sodium (VOLTAREN) 1 % GEL Apply topically. 09/19/21   [provider]  ?DULoxetine (CYMBALTA) 20 MG capsule Take 1 capsule (20 mg total)  by mouth daily. X 1 week, then 40 mg daily for 1 week, then 60 mg daily- for nerve/back pain 02/05/22   Lovorn, Aundra MilletMegan, MD  ?ezetimibe (ZETIA) 10 MG tablet Take 1 tablet (10 mg total) by mouth daily. 06/06/21   Alver SorrowWalker, Caitlin S, NP  ?hydrALAZINE (APRESOLINE) 25 MG tablet TAKE 1 TABLET(25 MG) BY MOUTH DAILY. 06/06/21   Alver SorrowWalker, Caitlin S, NP  ?hydrochlorothiazide (HYDRODIURIL) 25 MG tablet Take 1 tablet (25 mg total) by mouth daily. 08/14/21   Kathleene HazelMcAlhany, Christopher D, MD  ?melatonin 3 MG TABS tablet Take 3 mg by mouth at bedtime as needed (sleep).    [provider]  ?methimazole (TAPAZOLE) 5 MG tablet Take 5 mg by mouth daily.    [provider]  ?metoprolol succinate (TOPROL-XL) 50 MG 24 hr tablet Take 1 tablet (50 mg total) by mouth daily. 06/06/21   Alver SorrowWalker, Caitlin S, NP  ?nitroGLYCERIN (NITROSTAT) 0.3 MG SL tablet Place 1 tablet (0.3 mg total) under the tongue every 5 (five) minutes as needed for chest pain. 01/02/22   Kathleene HazelMcAlhany, Christopher D, MD  ?   ? ?Allergies    ?Ace inhibitors, Iodinated contrast media, Other, Shellfish-derived products, and Hydrocodone   ? ?Review of Systems   ?Review of Systems  ?Respiratory:  Negative for shortness of breath.   ?Cardiovascular:  Negative for chest pain.  ?Gastrointestinal:  Negative for abdominal pain.  ?Neurological:  Negative for headaches.  ?  All other systems reviewed and are negative. ? ?Physical Exam ?Updated Vital Signs ?BP 101/72   Pulse 68   Temp 98.6 ?F (37 ?C) (Oral)   Resp 18   SpO2 99%  ?Physical Exam ?Vitals and nursing note reviewed.  ?Constitutional:   ?   General: She is not in acute distress. ?   Appearance: Normal appearance. She is well-developed.  ?HENT:  ?   Head: Normocephalic and atraumatic.  ?Eyes:  ?   Conjunctiva/sclera: Conjunctivae normal.  ?   Pupils: Pupils are equal, round, and reactive to light.  ?Cardiovascular:  ?   Rate and Rhythm: Normal rate and regular rhythm.  ?   Heart sounds: Normal heart sounds.  ?Pulmonary:  ?    Effort: Pulmonary effort is normal. No respiratory distress.  ?   Breath sounds: Normal breath sounds.  ?Abdominal:  ?   General: There is no distension.  ?   Palpations: Abdomen is soft.  ?   Tenderness: There is no abdominal tenderness.  ?Musculoskeletal:     ?   General: No deformity. Normal range of motion.  ?   Cervical back: Normal range of motion and neck supple.  ?Skin: ?   General: Skin is warm and dry.  ?Neurological:  ?   General: No focal deficit present.  ?   Mental Status: She is alert. Mental status is at baseline.  ? ? ?ED Results / Procedures / Treatments   ?Labs ?(all labs ordered are listed, but only abnormal results are displayed) ?Labs Reviewed - No data to display ? ?EKG ?None ? ?Radiology ?DG Pelvis 1-2 Views ? ?Result Date: 04/02/2022 ?CLINICAL DATA:  Fall EXAM: PELVIS - 1-2 VIEW COMPARISON:  01/23/2016 FINDINGS: SI joints are non widened. Bilateral hip replacements with intact hardware and normal alignment. Pubic symphysis and rami are intact. No fracture. IMPRESSION: Bilateral hip replacements without acute osseous abnormality Electronically Signed   By: Jasmine Pang M.D.   On: 04/02/2022 19:25  ? ?CT Head Wo Contrast ? ?Result Date: 04/02/2022 ?CLINICAL DATA:  Head trauma, minor (Age >= 65y) Fall backwards striking back of head and neck. EXAM: CT HEAD WITHOUT CONTRAST TECHNIQUE: Contiguous axial images were obtained from the base of the skull through the vertex without intravenous contrast. RADIATION DOSE REDUCTION: This exam was performed according to the departmental dose-optimization program which includes automated exposure control, adjustment of the mA and/or kV according to patient size and/or use of iterative reconstruction technique. COMPARISON:  Remote head CT 05/25/2013 FINDINGS: Brain: No evidence of acute infarction, hemorrhage, hydrocephalus, extra-axial collection or mass lesion/mass effect. Generalized atrophy with progression from remote prior exam. Mild periventricular  chronic small vessel ischemia also progressed. There is a remote lacunar infarct in the left caudate. Enlarged partially empty sella, typically incidental Vascular: Atherosclerosis of skullbase vasculature without hyperdense vessel or abnormal calcification. Skull: No fracture or focal lesion. Sinuses/Orbits: Paranasal sinuses and mastoid air cells are clear. The visualized orbits are unremarkable. Other: No confluent scalp contusion. IMPRESSION: 1. No acute intracranial abnormality. No skull fracture. 2. Progressive atrophy and chronic small vessel ischemia since 2014. Electronically Signed   By: Narda Rutherford M.D.   On: 04/02/2022 19:12  ? ?CT Cervical Spine Wo Contrast ? ?Result Date: 04/02/2022 ?CLINICAL DATA:  Neck trauma (Age >= 65y) Fall striking back of head and neck. EXAM: CT CERVICAL SPINE WITHOUT CONTRAST TECHNIQUE: Multidetector CT imaging of the cervical spine was performed without intravenous contrast. Multiplanar CT image reconstructions were also generated. RADIATION DOSE  REDUCTION: This exam was performed according to the departmental dose-optimization program which includes automated exposure control, adjustment of the mA and/or kV according to patient size and/or use of iterative reconstruction technique. COMPARISON:  None. FINDINGS: Alignment: Normal. Skull base and vertebrae: No acute fracture. Vertebral body heights are maintained. The dens and skull base are intact. Underlying osteopenia/osteoporosis. Soft tissues and spinal canal: No prevertebral fluid or swelling. No visible canal hematoma. Disc levels: Degenerative disc disease is most prominent at C4-C5 and C5-C6. There is multilevel facet hypertrophy. Upper chest: Biapical pleuroparenchymal scarring no acute findings. Other: None. IMPRESSION: Multilevel degenerative disc disease and facet hypertrophy. No acute fracture or subluxation of the cervical spine. Electronically Signed   By: Narda Rutherford M.D.   On: 04/02/2022 19:15  ? ?DG  Chest Port 1 View ? ?Result Date: 04/02/2022 ?CLINICAL DATA:  Fall EXAM: PORTABLE CHEST 1 VIEW COMPARISON:  05/14/2021 FINDINGS: The heart size and mediastinal contours are within normal limits. Aortic atheroscler

## 2022-04-02 NOTE — Progress Notes (Addendum)
This patient returns to my office for at risk foot care.  This patient requires this care by a professional since this patient will be at risk due to having PAD, PVD.  This patient has painful callus on her left forefoot.  This callus is painful walking and wearing her shoes.  This patient presents for at risk foot care today.  General Appearance  Alert, conversant and in no acute stress.  Vascular  Dorsalis pedis and posterior tibial  pulses are weakly  palpable  bilaterally.  Capillary return is within normal limits  bilaterally. Temperature is within normal limits  bilaterally.  Neurologic  Senn-Weinstein monofilament wire test within normal limits  bilaterally. Muscle power within normal limits bilaterally.  Nails Thick disfigured discolored nails with subungual debris  from hallux to fifth toes bilaterally. No evidence of bacterial infection or drainage bilaterally.  Orthopedic  No limitations of motion  feet .  No crepitus or effusions noted.  No bony pathology or digital deformities noted.  Skin  normotropic skin noted bilaterally.  No signs of infections or ulcers noted.   Porokeratosis sub 3 left foot.    Porokeratosis sub 3 left foot.   Consent was obtained for treatment procedures.   Debridement of porokeratosis with # 15 blade.     Return office visit    9 weeks                  Told patient to return for periodic foot care and evaluation due to potential at risk complications.  Padding dispensed for dispersion padding.   Herbie Lehrmann DPM  

## 2022-04-02 NOTE — ED Triage Notes (Signed)
Pt brought in by family for falling backward and hitting the back of her head and neck due to getting off balanced.  ? ?Per one family member pt is more altered than normal. Per the other family member this is pt normal orientation.  ? ?Per husband pt is not on blood thinners.  ? ? ?

## 2022-04-16 ENCOUNTER — Ambulatory Visit: Payer: Medicare PPO | Admitting: Neurology

## 2022-04-24 DIAGNOSIS — H259 Unspecified age-related cataract: Secondary | ICD-10-CM | POA: Diagnosis not present

## 2022-04-24 DIAGNOSIS — G629 Polyneuropathy, unspecified: Secondary | ICD-10-CM | POA: Diagnosis not present

## 2022-04-24 DIAGNOSIS — I2510 Atherosclerotic heart disease of native coronary artery without angina pectoris: Secondary | ICD-10-CM | POA: Diagnosis not present

## 2022-04-24 DIAGNOSIS — E785 Hyperlipidemia, unspecified: Secondary | ICD-10-CM | POA: Diagnosis not present

## 2022-04-24 DIAGNOSIS — I739 Peripheral vascular disease, unspecified: Secondary | ICD-10-CM | POA: Diagnosis not present

## 2022-04-24 DIAGNOSIS — E0590 Thyrotoxicosis, unspecified without thyrotoxic crisis or storm: Secondary | ICD-10-CM | POA: Diagnosis not present

## 2022-04-24 DIAGNOSIS — F01A4 Vascular dementia, mild, with anxiety: Secondary | ICD-10-CM | POA: Diagnosis not present

## 2022-04-24 DIAGNOSIS — I10 Essential (primary) hypertension: Secondary | ICD-10-CM | POA: Diagnosis not present

## 2022-04-24 DIAGNOSIS — I252 Old myocardial infarction: Secondary | ICD-10-CM | POA: Diagnosis not present

## 2022-05-01 DIAGNOSIS — I110 Hypertensive heart disease with heart failure: Secondary | ICD-10-CM | POA: Diagnosis not present

## 2022-05-01 DIAGNOSIS — Z1231 Encounter for screening mammogram for malignant neoplasm of breast: Secondary | ICD-10-CM | POA: Diagnosis not present

## 2022-05-01 DIAGNOSIS — E1169 Type 2 diabetes mellitus with other specified complication: Secondary | ICD-10-CM | POA: Diagnosis not present

## 2022-05-01 DIAGNOSIS — I10 Essential (primary) hypertension: Secondary | ICD-10-CM | POA: Diagnosis not present

## 2022-05-01 DIAGNOSIS — H908 Mixed conductive and sensorineural hearing loss, unspecified: Secondary | ICD-10-CM | POA: Diagnosis not present

## 2022-05-01 DIAGNOSIS — I639 Cerebral infarction, unspecified: Secondary | ICD-10-CM | POA: Diagnosis not present

## 2022-05-01 DIAGNOSIS — R413 Other amnesia: Secondary | ICD-10-CM | POA: Diagnosis not present

## 2022-05-06 ENCOUNTER — Other Ambulatory Visit: Payer: Self-pay | Admitting: Family

## 2022-05-06 DIAGNOSIS — I25118 Atherosclerotic heart disease of native coronary artery with other forms of angina pectoris: Secondary | ICD-10-CM

## 2022-05-06 DIAGNOSIS — I10 Essential (primary) hypertension: Secondary | ICD-10-CM

## 2022-05-13 ENCOUNTER — Other Ambulatory Visit: Payer: Self-pay | Admitting: Family

## 2022-05-13 DIAGNOSIS — E782 Mixed hyperlipidemia: Secondary | ICD-10-CM

## 2022-05-13 DIAGNOSIS — I25118 Atherosclerotic heart disease of native coronary artery with other forms of angina pectoris: Secondary | ICD-10-CM

## 2022-05-13 NOTE — Telephone Encounter (Signed)
Pt of Dr. Clifton James. Please review for refill. Thank you!

## 2022-05-16 ENCOUNTER — Encounter: Payer: Self-pay | Admitting: Physical Medicine and Rehabilitation

## 2022-05-16 ENCOUNTER — Encounter: Payer: Medicare PPO | Attending: Physical Medicine and Rehabilitation | Admitting: Physical Medicine and Rehabilitation

## 2022-05-16 VITALS — BP 125/84 | HR 69 | Ht 62.0 in | Wt 94.0 lb

## 2022-05-16 DIAGNOSIS — E1169 Type 2 diabetes mellitus with other specified complication: Secondary | ICD-10-CM | POA: Diagnosis not present

## 2022-05-16 DIAGNOSIS — E782 Mixed hyperlipidemia: Secondary | ICD-10-CM | POA: Diagnosis not present

## 2022-05-16 DIAGNOSIS — R413 Other amnesia: Secondary | ICD-10-CM | POA: Diagnosis not present

## 2022-05-16 DIAGNOSIS — D519 Vitamin B12 deficiency anemia, unspecified: Secondary | ICD-10-CM | POA: Diagnosis not present

## 2022-05-16 DIAGNOSIS — E559 Vitamin D deficiency, unspecified: Secondary | ICD-10-CM | POA: Diagnosis not present

## 2022-05-16 DIAGNOSIS — M5416 Radiculopathy, lumbar region: Secondary | ICD-10-CM | POA: Insufficient documentation

## 2022-05-16 DIAGNOSIS — Z7409 Other reduced mobility: Secondary | ICD-10-CM | POA: Diagnosis not present

## 2022-05-16 DIAGNOSIS — I639 Cerebral infarction, unspecified: Secondary | ICD-10-CM | POA: Diagnosis not present

## 2022-05-16 DIAGNOSIS — D520 Dietary folate deficiency anemia: Secondary | ICD-10-CM | POA: Diagnosis not present

## 2022-05-16 DIAGNOSIS — E079 Disorder of thyroid, unspecified: Secondary | ICD-10-CM | POA: Diagnosis not present

## 2022-05-16 DIAGNOSIS — R269 Unspecified abnormalities of gait and mobility: Secondary | ICD-10-CM | POA: Diagnosis not present

## 2022-05-16 DIAGNOSIS — R634 Abnormal weight loss: Secondary | ICD-10-CM | POA: Diagnosis not present

## 2022-05-16 DIAGNOSIS — I10 Essential (primary) hypertension: Secondary | ICD-10-CM | POA: Diagnosis not present

## 2022-05-16 MED ORDER — DULOXETINE HCL 60 MG PO CPEP: 60.0000 mg | ORAL_CAPSULE | Freq: Every day | ORAL | 5 refills | Status: DC

## 2022-05-16 NOTE — Progress Notes (Addendum)
Subjective:    Patient ID: Kristine [name], female    DOB: [birth year], 81 y.o.   MRN: [id number]  HPI  Pt is a 81 yr old female with hx of L THR in 2017; asthma, COPD; CAD s/p stents; hx of stroke- no weakness; HTN, and HLD; chronic low back pain. With memory/cognitive impairments.  Here for f/u on chronic back pain.   Not hurting today.  Complains mostly about groin and upper thigh.  Was complaining of low back pain last visit and down L leg, however isn't complaining about that pain anymore at all.  Now c/o intermittently- upper thighs- -rubs ointment on her at night- and pt tells husband it's helpful.  Doesn't complain every day- sometimes can go up to 2-3 weeks between complaining of thigh pain.   Gum hurting her to L upper cheek. 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    Is now a fall risk- more than used ot be.  Larey SeatFell last 3/25-  Has had a lot of near falls- sometimes up to a couple times per day.    Sleeping OK- well overnight. Takes something from Dr Epimenio FootSater and Melatonin.     On Duloxetine 60 mg nightly.     Memory been getting worse the last couple of months.  And no one is helping figure out why, per husband.   Husband having to work to take care of her-secondary to money issues.  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   Memory has been a bigger issue in last 2-3 months. Since March. Wants to do things cannot do anymore.  Repeating herself a lot in the same day.  Loses keys and water bottle per pt.  Puts things down and forgets where she put them.  Goes into room and doesn't remember why she went there.  STM is the main issue, not long term memory.   Hearing is also getting- have hearing aids- but have ot put on/off so many times/day.   Talking loud agitating to her.  Feels like more irritable.   Considerably weight loss- in last 3 months- ~ 105 lbs prior  Pain Inventory Average Pain unable to determine Pain Right Now unable to determine My pain is unable  to determine  In the last 24 hours, has pain interfered with the following? General activity 5 Relation with others 0 Enjoyment of life 8 What TIME of day is your pain at its worst? evening Sleep (in general) Good  Pain is worse with: walking and standing Pain improves with:  . Relief from Meds: 8  Family History  Problem Relation Age of Onset   Peripheral vascular disease Mother    Congestive Heart Failure Mother    Heart attack Father    Social History   Socioeconomic History   Marital status: Married    Spouse name: Cabin crewColen   Number of children: 3   Years of education: Masters   Highest education level: Not on file  Occupational History   Occupation: Retired  Tobacco Use   Smoking status: Former    Packs/day: 1.00    Years: 45.00    Total pack years: 45.00    Types: Cigarettes    Quit date: 04/07/2010    Years since quitting: 12.1   Smokeless tobacco: Never  Vaping Use   Vaping Use: Never used  Substance and Sexual Activity   Alcohol use: Yes    Comment: rarely    Drug use: No   Sexual activity: Not on file  Other Topics Concern   Not on file  Social History Narrative   Lives with husband, daughter and her son   Right handed   Tea sometimes, decaf coffee   Social Determinants of Health   Financial Resource Strain: Not on file  Food Insecurity: Not on file  Transportation Needs: Not on file  Physical Activity: Not on file  Stress: Not on file  Social Connections: Not on file   Past Surgical History:  Procedure Laterality Date   ABDOMINAL HYSTERECTOMY     Had ovarian resection and required lysis of adhesions   CARDIAC CATHETERIZATION  04/07/2010   EF 60-70%   CORONARY STENTS     x3   ESOPHAGOGASTRODUODENOSCOPY N/A 03/10/2015   Procedure: ESOPHAGOGASTRODUODENOSCOPY (EGD);  Surgeon: Wandalee Ferdinand, MD;  Location: [facility] ENDOSCOPY;  Service: Endoscopy;  Laterality: N/A;   OVARY SURGERY     TOTAL HIP ARTHROPLASTY  2004   left   TOTAL HIP ARTHROPLASTY Right  01/23/2016   Procedure: RIGHT TOTAL HIP ARTHROPLASTY ANTERIOR APPROACH;  Surgeon: Loreta Ave, MD;  Location: [facility] OR;  Service: Orthopedics;  Laterality: Right;   Past Surgical History:  Procedure Laterality Date   ABDOMINAL HYSTERECTOMY     Had ovarian resection and required lysis of adhesions   CARDIAC CATHETERIZATION  04/07/2010   EF 60-70%   CORONARY STENTS     x3   ESOPHAGOGASTRODUODENOSCOPY N/A 03/10/2015   Procedure: ESOPHAGOGASTRODUODENOSCOPY (EGD);  Surgeon: Wandalee Ferdinand, MD;  Location: [facility] ENDOSCOPY;  Service: Endoscopy;  Laterality: N/A;   OVARY SURGERY     TOTAL HIP ARTHROPLASTY  2004   left   TOTAL HIP ARTHROPLASTY Right 01/23/2016   Procedure: RIGHT TOTAL HIP ARTHROPLASTY ANTERIOR APPROACH;  Surgeon: Loreta Ave, MD;  Location: [facility] OR;  Service: Orthopedics;  Laterality: Right;   Past Medical History:  Diagnosis Date   COPD (chronic obstructive pulmonary disease) (HCC)    Hard of hearing    History of anemia    History of blood transfusion    History of pneumonia    HTN (hypertension)    Hyperlipidemia    Hyperthyroidism    treated wtih radioactive iodine   OA (osteoarthritis of spine)    PVD (peripheral vascular disease) (HCC)    Lower extremity Dopplers  May 2revealed an ABI of 0.83 in the right posterior    Shortness of breath dyspnea    STEMI (ST elevation myocardial infarction) [facility]) May 2011   Promus DES in the proximal, mid and distal RCA in May 2011   Stroke [facility])    Urinary frequency    Urinary urgency    BP 125/84   Pulse 69   Ht 5\' 2"  (1.575 m)   Wt 94 lb (42.6 kg)   SpO2 96%   BMI 17.19 kg/m   Opioid Risk Score:   Fall Risk Score:  `1  Depression screen [facility] 2/9     05/16/2022    9:35 AM 02/05/2022    9:35 AM  Depression screen PHQ 2/9  Decreased Interest 0 0  Down, Depressed, Hopeless 0 0  PHQ - 2 Score 0 0  Altered sleeping  0  Tired, decreased energy  0  Change in appetite  0  Feeling bad or failure about yourself   0  Trouble  concentrating  1  Moving slowly or fidgety/restless  0  Suicidal thoughts  0  PHQ-9 Score  1     Review of Systems  Constitutional: Negative.   HENT: Negative.  Eyes: Negative.   Respiratory: Negative.    Cardiovascular: Negative.   Gastrointestinal: Negative.   Endocrine: Negative.   Genitourinary: Negative.   Musculoskeletal:  Positive for back pain and gait problem.  Skin: Negative.   Allergic/Immunologic: Negative.   Hematological: Negative.   Psychiatric/Behavioral:  Positive for confusion.   All other systems reviewed and are negative.      Objective:   Physical Exam Appears much thinner/more frail Awake, alert, decreased spontaneous speech; accompanied by husband, NAD No gum irritation No upper teeth in mouth Pale gums Shuffling gait MS: ue strength 5-/5 throughout LE's equal 4+/5 throughout- equal on both sides.  No facial droop     Assessment & Plan:   Pt is a 81 yr old female with hx of L THR in 2017; asthma, COPD; CAD s/p stents; hx of stroke- no weakness; HTN, and HLD; chronic low back pain. And memory/cognitive impairment. Here for f/u on chronic back pain Pain is doing better. But having much more memory issues/cognitive impairment.    No meds I have ot discontinue, since no meds on her med list that should be contributing to her memory issues.  Called Pt's PCP, Dr Parke Simmers- and she will check TSH, Folate and Vitamin B12 on pt.  3 Compliant with thyroid meds.   4. Suggest brain MRI since memory issues are changing so fast/so dramatically.  Might also benefit from oncology work up esp due to weight loss.   5. Down to 94 lbs - so lost 10+ of weight in last 3-5 months per husband- concern- needs additional work up as well  6. Con't Duloxetine - hasn't had any flexeril lately- keep off it. For back and nerve pain  7. Will order H/H for impaired balance to improve frequent falls. Pt needs H/H PT and OT to work on functional gains- has lost strength,  endurance, ROM, and is having near falls 2+/xday; and frequent actual falls as well- Husband is also asking for SLP/speech therapy- due to memory/cognitive decline, needing work up  8. When has aches and pains- take tylenol as needed- for mouth pain can take /use ambesol or oragel form drug store- is topical.   9. F/U in 62months-   I spent a total of  43  minutes on total care today- >50% coordination of care- due to calling Dr Parke Simmers and discussing memory issues/cognitive impairment.

## 2022-05-16 NOTE — Patient Instructions (Addendum)
Pt is a 81 yr old female with hx of L THR in 2017; asthma, COPD; CAD s/p stents; hx of stroke- no weakness; HTN, and HLD; chronic low back pain. And memory/cognitive impairment. Here for f/u on chronic back pain Pain is doing better. But having much more memory issues/cognitive impairment.    No meds I have ot discontinue, since no meds on her med list that should be contributing to her memory issues.  Called Pt's PCP, Dr Parke Simmers- and she will check TSH, Folate and Vitamin B12 on pt.  3 Compliant with thyroid meds.   4. Suggest brain MRI since memory issues are changing so fast/so dramatically.  Might also benefit from oncology work up esp due to weight loss.   5. Down to 94 lbs - so lost 10+ of weight in last 3-5 months per husband- concern- needs additional work up as well  6. Con't Duloxetine - hasn't had any flexeril lately- keep off it. For back and nerve pain- changed to 60 mg pill/capsule for night time- finish current pills at home then start this Rx.   7. Will order H/H for impaired balance to improve frequent falls.   8. When has aches and pains- take tylenol as needed- for mouth pain can take /use ambesol or oragel form drug store- is topical.   9. F/U in 83months-

## 2022-05-22 DIAGNOSIS — I252 Old myocardial infarction: Secondary | ICD-10-CM | POA: Diagnosis not present

## 2022-05-22 DIAGNOSIS — G8929 Other chronic pain: Secondary | ICD-10-CM | POA: Diagnosis not present

## 2022-05-22 DIAGNOSIS — E039 Hypothyroidism, unspecified: Secondary | ICD-10-CM | POA: Diagnosis not present

## 2022-05-22 DIAGNOSIS — M4726 Other spondylosis with radiculopathy, lumbar region: Secondary | ICD-10-CM | POA: Diagnosis not present

## 2022-05-22 DIAGNOSIS — J449 Chronic obstructive pulmonary disease, unspecified: Secondary | ICD-10-CM | POA: Diagnosis not present

## 2022-05-22 DIAGNOSIS — Z8673 Personal history of transient ischemic attack (TIA), and cerebral infarction without residual deficits: Secondary | ICD-10-CM | POA: Diagnosis not present

## 2022-05-22 DIAGNOSIS — I10 Essential (primary) hypertension: Secondary | ICD-10-CM | POA: Diagnosis not present

## 2022-05-22 DIAGNOSIS — D649 Anemia, unspecified: Secondary | ICD-10-CM | POA: Diagnosis not present

## 2022-05-23 ENCOUNTER — Telehealth: Payer: Self-pay

## 2022-05-23 NOTE — Telephone Encounter (Signed)
Verbal order given to R. Vonita Moss Physical Therapist with [facility] for #12 Home Health visits. With the addition of occupational therapy for ADL's & Social Work evaluation for [employer].  Call back phone [phone].  (Original order placed on 05/19/2022).

## 2022-05-29 IMAGING — DX DG CHEST 2V
2 series · 2 of 2 positions shown · non-contrast
Comparison: 09/25/2017

CLINICAL DATA: Chest pain.

EXAM:
CHEST - 2 VIEW

[chest lat]
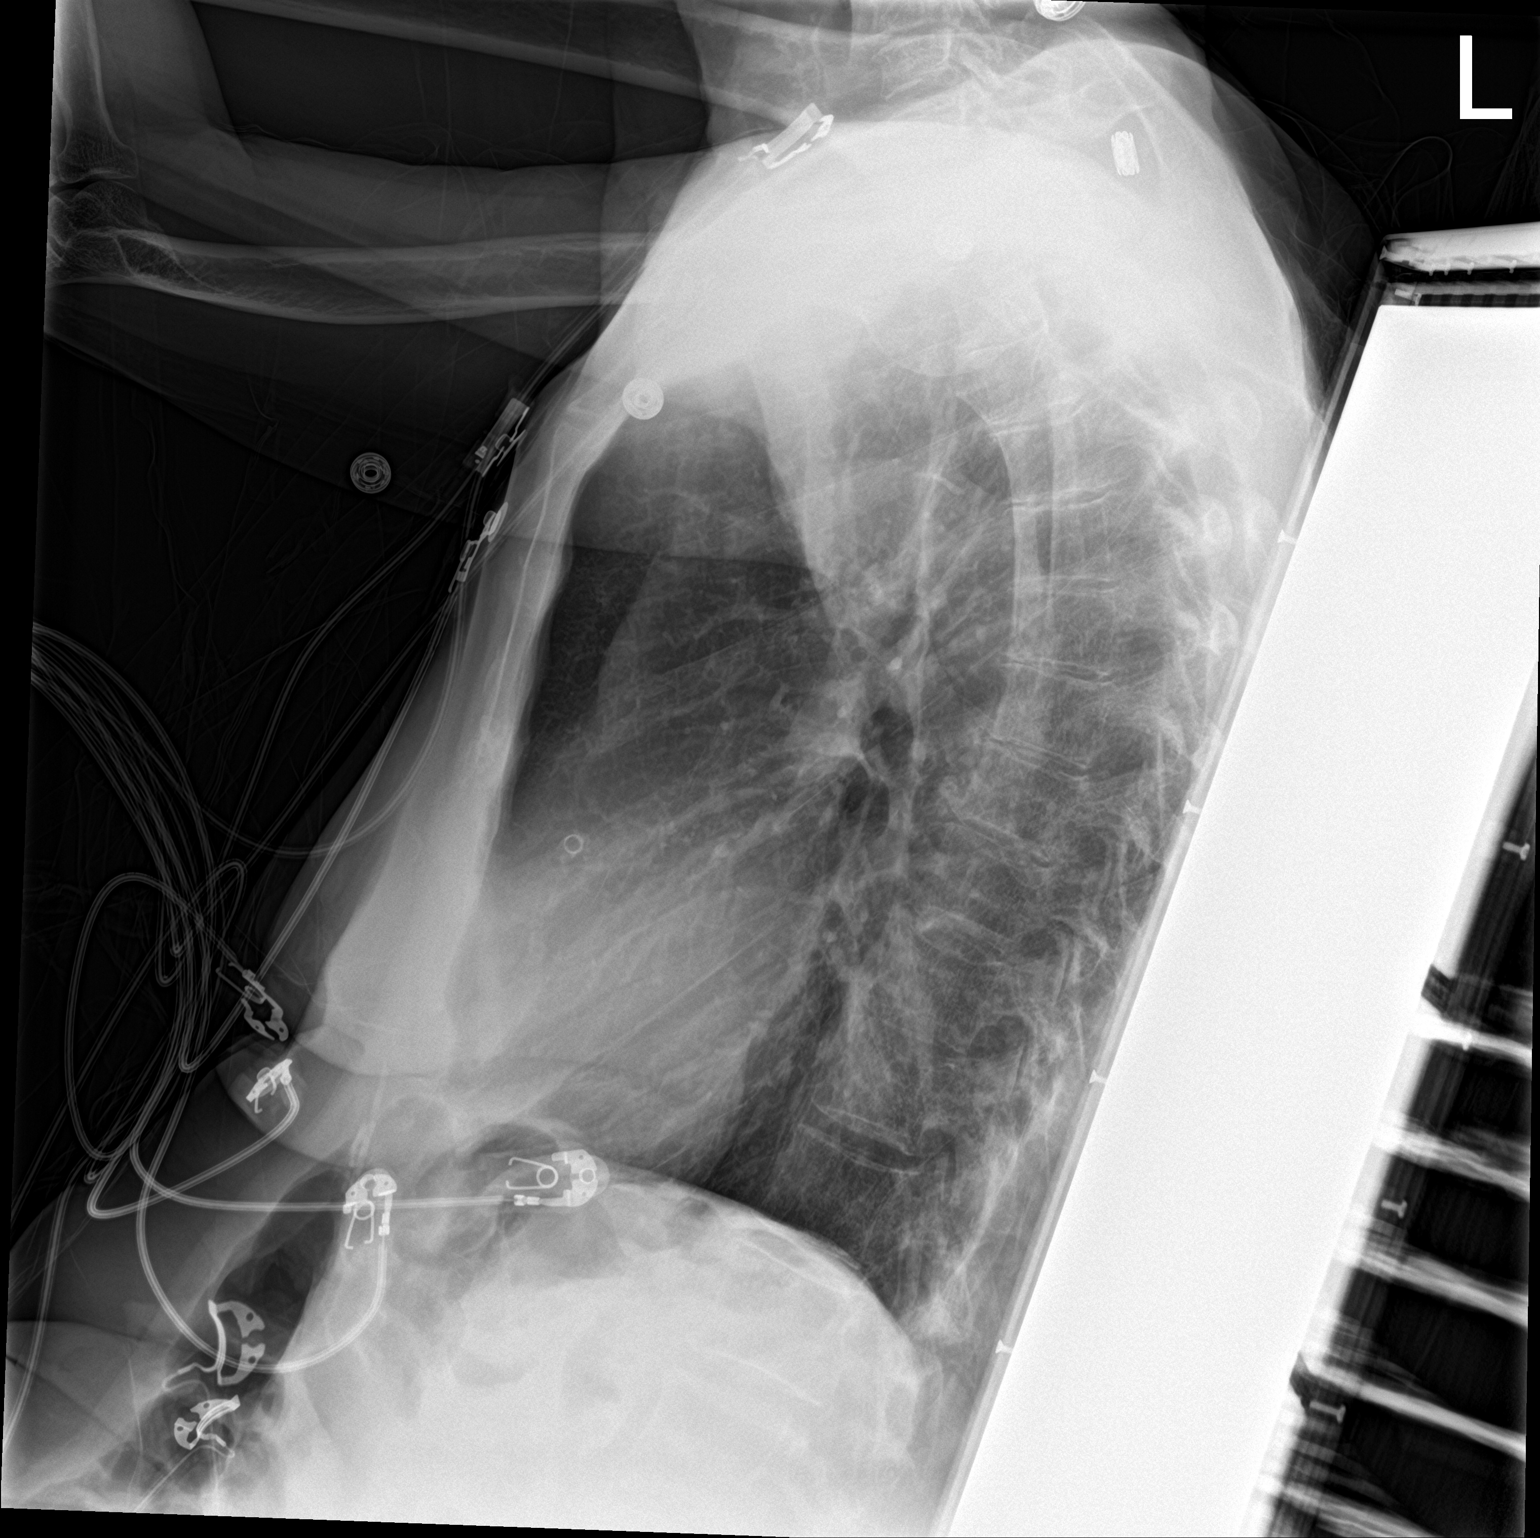

[chest ap]
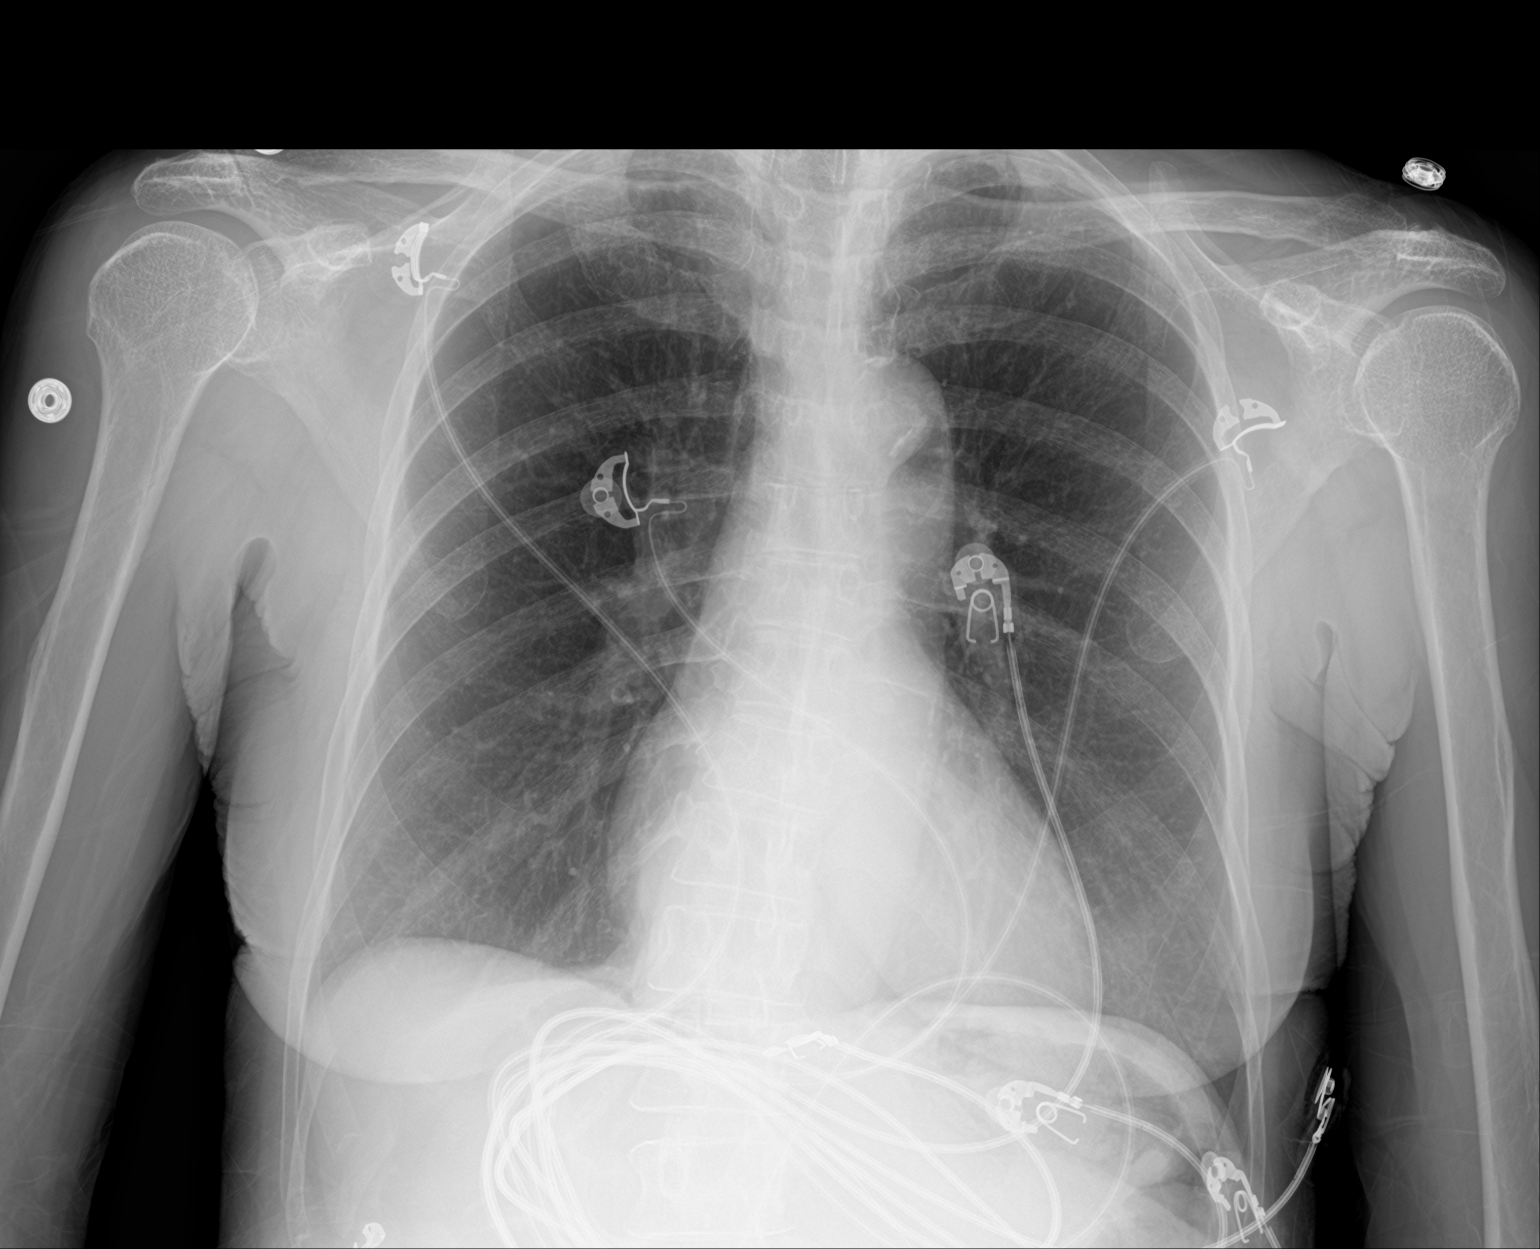

[2 of 2 positions shown; findings below may reference images not displayed]

FINDINGS: The cardiomediastinal contours are normal. Atherosclerosis of the
thoracic aorta. The lungs are clear. Pulmonary vasculature is
normal. No consolidation, pleural effusion, or pneumothorax. No
acute osseous abnormalities are seen.
IMPRESSION: No acute chest findings.

## 2022-06-02 ENCOUNTER — Ambulatory Visit (INDEPENDENT_AMBULATORY_CARE_PROVIDER_SITE_OTHER): Payer: Medicare PPO | Admitting: Neurology

## 2022-06-02 ENCOUNTER — Encounter: Payer: Self-pay | Admitting: Neurology

## 2022-06-02 ENCOUNTER — Other Ambulatory Visit: Payer: Self-pay | Admitting: Neurology

## 2022-06-02 VITALS — BP 98/69 | HR 75 | Ht 63.0 in | Wt 91.5 lb

## 2022-06-02 DIAGNOSIS — G3183 Dementia with Lewy bodies: Secondary | ICD-10-CM | POA: Diagnosis not present

## 2022-06-02 DIAGNOSIS — R269 Unspecified abnormalities of gait and mobility: Secondary | ICD-10-CM | POA: Diagnosis not present

## 2022-06-02 DIAGNOSIS — G4752 REM sleep behavior disorder: Secondary | ICD-10-CM

## 2022-06-02 DIAGNOSIS — R413 Other amnesia: Secondary | ICD-10-CM | POA: Diagnosis not present

## 2022-06-02 DIAGNOSIS — F0280 Dementia in other diseases classified elsewhere without behavioral disturbance: Secondary | ICD-10-CM | POA: Diagnosis not present

## 2022-06-02 MED ORDER — CLONAZEPAM 0.5 MG PO TABS: ORAL_TABLET | ORAL | 5 refills | Status: DC

## 2022-06-02 MED ORDER — DONEPEZIL HCL 5 MG PO TABS: 5.0000 mg | ORAL_TABLET | Freq: Every day | ORAL | 0 refills | Status: DC

## 2022-06-18 ENCOUNTER — Telehealth: Payer: Self-pay | Admitting: Neurology

## 2022-06-18 MED ORDER — CLONAZEPAM 0.5 MG PO TABS: 0.5000 mg | ORAL_TABLET | Freq: Every day | ORAL | 5 refills | Status: DC

## 2022-06-18 NOTE — Telephone Encounter (Signed)
Reviewed pt last visit with Dr. Epimenio Foot on 06/02/22. Dr. Epimenio Foot documented: "I will place her back on a lower dose of clonazepam (0.25 mg-was on 0.5 mg).  Although benzodiazepines have some cognitive effect on elderly patients, taking a low-dose at bedtime should have minimal effect the next day and a better night sleep will allow her to function better."  I called husband, Rashida Ladouceur back at [phone]. Daughter picked up. States she told phone staff to call his mobile #, [facility]. Apologized, advised this message was not given to [place].   She reports that since mother has been on 0.25mg  clonazepam, ineffective. Last few nights, husband increased to 0.5mg  (2-0.25mg  tab) when she started kicking/swinging around 12a-2pm. She did better with this dose. She slept better last night as well. Wanting to keep her at this dose if MD approves/has new rx sent for this dose.  Aware I will send message to MD and will call husband back with recommendation. She states we should call his mobile# [phone].  Asked we call around 2-3pm. Advised Dr. Epimenio Foot not back in office until this afternoon.

## 2022-06-18 NOTE — Telephone Encounter (Signed)
Took call from phone staff and spoke w/ husband. Relayed message again. He verbalized understanding and will pick up rx from pharmacy.

## 2022-06-18 NOTE — Addendum Note (Signed)
Addended by: Arther Abbott on: 06/18/2022 04:38 PM   Modules accepted: Orders

## 2022-06-18 NOTE — Addendum Note (Signed)
Addended by: Asa Lente on: 06/18/2022 04:44 PM   Modules accepted: Orders

## 2022-06-18 NOTE — Telephone Encounter (Signed)
Pt's daughter is asking that pt's husband(which is on DPR) be called to discuss the current dose of clonazePAM (KLONOPIN) 0.5 MG tablet is not helping pt and they would like a call to discuss an increase

## 2022-06-18 NOTE — Telephone Encounter (Signed)
LVM for husband at [phone] letting him know MD approved calling in 0.34m po qhs for her to take. We will send in updated rx. Advised him to call back if any questions arise.

## 2022-07-01 ENCOUNTER — Ambulatory Visit: Payer: Medicare PPO | Admitting: Podiatry

## 2022-07-01 ENCOUNTER — Encounter: Payer: Self-pay | Admitting: Podiatry

## 2022-07-01 DIAGNOSIS — I739 Peripheral vascular disease, unspecified: Secondary | ICD-10-CM

## 2022-07-01 DIAGNOSIS — Q828 Other specified congenital malformations of skin: Secondary | ICD-10-CM | POA: Diagnosis not present

## 2022-07-01 NOTE — Progress Notes (Signed)
This patient returns to my office for at risk foot care.  This patient requires this care by a professional since this patient will be at risk due to having PAD, PVD.  This patient has painful callus on her left forefoot.  This callus is painful walking and wearing her shoes.  This patient presents for at risk foot care today.  General Appearance  Alert, conversant and in no acute stress.  Vascular  Dorsalis pedis and posterior tibial  pulses are weakly  palpable  bilaterally.  Capillary return is within normal limits  bilaterally. Temperature is within normal limits  bilaterally.  Neurologic  Senn-Weinstein monofilament wire test within normal limits  bilaterally. Muscle power within normal limits bilaterally.  Nails Thick disfigured discolored nails with subungual debris  from hallux to fifth toes bilaterally. No evidence of bacterial infection or drainage bilaterally.  Orthopedic  No limitations of motion  feet .  No crepitus or effusions noted.  No bony pathology or digital deformities noted.  Skin  normotropic skin noted bilaterally.  No signs of infections or ulcers noted.   Porokeratosis sub 3 left foot.    Porokeratosis sub 3 left foot.   Consent was obtained for treatment procedures.   Debridement of porokeratosis with # 15 blade.     Return office visit    9 weeks                  Told patient to return for periodic foot care and evaluation due to potential at risk complications.  Padding dispensed for dispersion padding.   Lior Cartelli DPM  

## 2022-07-14 ENCOUNTER — Other Ambulatory Visit: Payer: Self-pay | Admitting: Neurology

## 2022-07-14 ENCOUNTER — Other Ambulatory Visit: Payer: Self-pay | Admitting: Family

## 2022-07-14 DIAGNOSIS — I10 Essential (primary) hypertension: Secondary | ICD-10-CM

## 2022-07-14 NOTE — Telephone Encounter (Signed)
Patient husband left a voicemail on my phone stating he would like a nurse to call him right away.. he did not say anything on what it is about.

## 2022-07-14 NOTE — Telephone Encounter (Signed)
Called the husband back. He states the patient during the hours of 10 pm-12 am or a little after she is having movements in her sleep. She is kicking and moving arms a lot. She is talking in sleep. 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  Currently she is taking the melatonin 3 mg at bedtime along with the Clonazepam 0.5 mg at bedtime. He says she is taking full tablet at bedtime not 1/2 tab. Advised I would bring this to  Dr. Bonnita Hollow attention and see what he recommends. Pt's husband verbalized understanding and had no further questions.

## 2022-07-14 NOTE — Telephone Encounter (Signed)
Called the patient's husband back. I advised him to start tonight by giving her 2 tablet to equal 1 mg at bedtime. He counted 24 of the 0.5 mg tablet which is about 12 nights worth. Advised over the next few nights she should try the 1 mg at bedtime and if she becomes to foggy we will have to decrease back down to the 0.5 mg instructed the patient's husband to call us if there are any problems. I set a reminder to call and check on her on Thursday to decide if we need to call in the 1 mg tablet going forward. He verbalized understanding.

## 2022-07-14 NOTE — Telephone Encounter (Signed)
Rx(s) sent to pharmacy electronically.  

## 2022-07-17 MED ORDER — CLONAZEPAM 1 MG PO TABS: 1.0000 mg | ORAL_TABLET | Freq: Every day | ORAL | 5 refills | Status: DC

## 2022-07-17 NOTE — Telephone Encounter (Signed)
Called the husband to check in on pt and see how increasing to 1 mg has been working. He states she is tolerating that dose well and he would like to continue with this strength. Advised a updated script will be sent out to pharmacy for the patient.

## 2022-07-17 NOTE — Addendum Note (Signed)
Addended by: Judi Cong on: 07/17/2022 11:36 AM   Modules accepted: Orders

## 2022-07-22 ENCOUNTER — Telehealth: Payer: Self-pay

## 2022-07-22 NOTE — Telephone Encounter (Signed)
Kristine [name] has called to request continued physical therapy for [name]. Per spouse she was receiving 3 types of therapy. One was physical therapy, occupational therapy and he could not remember the other. Services where provided by [facility].   If granted please send in the referral.  Call back phone [phone].  Thank  you. -- Caller is aware Dr. Berline Chough is out of the office this week.

## 2022-07-23 ENCOUNTER — Telehealth: Payer: Self-pay

## 2022-07-23 NOTE — Telephone Encounter (Signed)
Kristine [name] from [facility] called in for a denial for continued at home PT for Kristine [name] [phone] stating it denied due to not being medically necessary.  she is asking if this can be appealed for patient and add documentation to support the medical necessity.

## 2022-07-24 ENCOUNTER — Telehealth: Payer: Self-pay | Admitting: Neurology

## 2022-07-24 NOTE — Telephone Encounter (Signed)
Called the patient's husband and he states that he will pick up the script at the pharmacy as it had already been sent.

## 2022-07-24 NOTE — Telephone Encounter (Signed)
Patient husband left a voicemail on my phone stating he is waiting on a call to get a prescription filled.

## 2022-07-28 NOTE — Telephone Encounter (Signed)
I made changes to the note dated 05/16/22- hopefully that's helpful to file an appeal for [facility] and asked for PT/OT and Speech therapy- ML

## 2022-07-29 NOTE — Telephone Encounter (Signed)
error 

## 2022-07-29 NOTE — Telephone Encounter (Signed)
Edwina (Daughter)  called to request Home Health for Mrs. Kristine [name]. Today she had a fall  with no injury. Sheral Flow was able to guide her the ground. She will follow up with her PCP. However she wanted Dr. Berline Chough to know how much physical therapy is needed for Mrs. Kristine [name].   (Dr. Berline Chough is out of the office)  Call back phone [phone] Mr. Kristine [name]

## 2022-08-04 ENCOUNTER — Other Ambulatory Visit: Payer: Self-pay

## 2022-08-04 ENCOUNTER — Telehealth: Payer: Self-pay | Admitting: Neurology

## 2022-08-04 ENCOUNTER — Other Ambulatory Visit: Payer: Self-pay | Admitting: Neurology

## 2022-08-04 DIAGNOSIS — I10 Essential (primary) hypertension: Secondary | ICD-10-CM

## 2022-08-04 DIAGNOSIS — E1169 Type 2 diabetes mellitus with other specified complication: Secondary | ICD-10-CM | POA: Diagnosis not present

## 2022-08-04 MED ORDER — CLONAZEPAM 1 MG PO TABS: ORAL_TABLET | ORAL | 5 refills | Status: DC

## 2022-08-04 MED ORDER — MIRTAZAPINE 15 MG PO TABS: ORAL_TABLET | ORAL | 5 refills | Status: DC

## 2022-08-04 MED ORDER — HYDROCHLOROTHIAZIDE 25 MG PO TABS: 25.0000 mg | ORAL_TABLET | Freq: Every day | ORAL | 1 refills | Status: DC

## 2022-08-04 NOTE — Addendum Note (Signed)
Addended by: Arther Abbott on: 08/04/2022 03:48 PM   Modules accepted: Orders

## 2022-08-04 NOTE — Telephone Encounter (Signed)
Misty Stanley, can  you please call husband and offer 08/25/22 at 1pm for appt with Dr. Epimenio Foot? Thank you

## 2022-08-04 NOTE — Telephone Encounter (Signed)
Called husband and he handed phone over to daughter. Relayed recommendation from Dr. Epimenio Foot. She states they are currently w/ PCP and they are discussing that benzo's could be making her sx worse and taking about tapering off. Dr. Epimenio Foot picked up call to further discuss.

## 2022-08-04 NOTE — Telephone Encounter (Signed)
Called husband back. Wife taking clonazepam 1mg  po qhs and melatonin 5mg  po qhs (even though med list stated 3mg ). I updated med list. Aware I will resend to MD to see what change he would recommend and will call back.

## 2022-08-04 NOTE — Telephone Encounter (Signed)
I spoke to Ms. Mcguinness's daughter.  I got further clarification about the awakenings at night.  She has REM behavior disorder and had episodes of kicking and punching at night that improved after she started clonazepam 0.5 mg.  However, over the past few months she is waking up more at night.  When she wakes up she is confused but she is not having REM behavior disorder type of symptoms.  Before this phone call I had sent in a prescription for a higher dose of clonazepam.  However, given this further clarification I am going to have her continue 0.5 to 1 mg of clonazepam at night and to take mirtazapine low-dose at night to see if she sleeps better through the night.  Of note, she has also lost some appetite and now weighs under 100 pounds.

## 2022-08-04 NOTE — Telephone Encounter (Signed)
Called husband back. He gets up at [facility] for work. 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 No longer kicking/biting on lorazepam. However, her waking up in middle of the night started in last 2-3 weeks. Not sure if she has UTI. Does not feel she does. Is having regular BM's. Used to have constipation issues. No other signs of illness/sickness.   She fell 07/29/22 but had no injuries. He had doctor appt. They were on their way to eat. She got out of car in parking lot and fell. She was trying to help him out of car. Daughter caught her. Has not started any other new meds in the last few weeks. Started on medicated mouthwash by dentist. They did a deep cleaning 07/28/22. 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 Visited old school to show her it was closed. Having increased confusion throughout the day. Typically goes to bed around 8:30/9pm. Aware I will send to Dr. Epimenio Foot for review and call back with recommendation.

## 2022-08-04 NOTE — Telephone Encounter (Signed)
Pt's daughter(not on DPR) is asking that her pt's spouse(on DPR) be called.  The concern is that pt wakes up around 2,4 and 6 thinking she is to report to work or has a Armed forces technical officer.  Daughter states when pt gets up, her spouse gets up.  This is affecting his sleep as well.  This has been going on for about 2-3 weeks.  Daughter has suggested spouse be called at [phone] if he can not be reached on # listed for him.  The suggestion was made that daughter speaks with her dad to see if he would consider adding her to the [facility].

## 2022-08-05 ENCOUNTER — Telehealth: Payer: Self-pay

## 2022-08-05 NOTE — Telephone Encounter (Signed)
Pt's husband was called, pt has been scheduled for 09-18 with 12:30 check in for 1:00 appointment, this is FYI no call back requested.

## 2022-08-05 NOTE — Telephone Encounter (Signed)
Patient's daughter, Sheral Flow, called to make sure we were aware of the fall the patient had last Monday 07/21/22. Informed her that we were aware. She stated she would like for her mother to start PT again because she can tell she is not improving as much as she was before since last week when she fell. She stated she has left a message with her previous physical therapist and is waiting on a call back. Informed her that Dr. Berline Chough revised her note to support further PT and if she needs the note or orders, she can call the office

## 2022-08-07 DIAGNOSIS — I10 Essential (primary) hypertension: Secondary | ICD-10-CM | POA: Diagnosis not present

## 2022-08-07 DIAGNOSIS — E782 Mixed hyperlipidemia: Secondary | ICD-10-CM | POA: Diagnosis not present

## 2022-08-07 DIAGNOSIS — E1169 Type 2 diabetes mellitus with other specified complication: Secondary | ICD-10-CM | POA: Diagnosis not present

## 2022-08-19 ENCOUNTER — Telehealth: Payer: Self-pay | Admitting: *Deleted

## 2022-08-19 NOTE — Telephone Encounter (Signed)
Mr Hone called and said that his wife's therapy has been denied. He is asking for help appealing the decision.

## 2022-08-22 NOTE — Telephone Encounter (Signed)
I called and left a message with the manager (VM) to give [place] a call back and explain why therapy ended and why the insurance denied the recert.

## 2022-08-22 NOTE — Telephone Encounter (Signed)
I have no idea how to help with appealing the decision- I guess he needs to check with Home Health - because I think they need to appeal- however if she wasn't making gains, and they stopped, there's no way to get until next year- he probably used her max insurance therapy benefits- so I think we are stuck :( Thanks- ML

## 2022-08-25 ENCOUNTER — Ambulatory Visit: Payer: Medicare PPO | Admitting: Neurology

## 2022-08-25 ENCOUNTER — Encounter: Payer: Self-pay | Admitting: Neurology

## 2022-08-25 VITALS — BP 111/77 | HR 66 | Ht 63.0 in | Wt 99.0 lb

## 2022-08-25 DIAGNOSIS — G3183 Dementia with Lewy bodies: Secondary | ICD-10-CM

## 2022-08-25 DIAGNOSIS — G4719 Other hypersomnia: Secondary | ICD-10-CM

## 2022-08-25 DIAGNOSIS — G4752 REM sleep behavior disorder: Secondary | ICD-10-CM

## 2022-08-25 DIAGNOSIS — F0280 Dementia in other diseases classified elsewhere without behavioral disturbance: Secondary | ICD-10-CM | POA: Diagnosis not present

## 2022-08-25 DIAGNOSIS — R269 Unspecified abnormalities of gait and mobility: Secondary | ICD-10-CM | POA: Diagnosis not present

## 2022-08-25 MED ORDER — CLONAZEPAM 1 MG PO TABS: ORAL_TABLET | ORAL | 5 refills | Status: DC

## 2022-08-25 MED ORDER — DULOXETINE HCL 30 MG PO CPEP: 30.0000 mg | ORAL_CAPSULE | Freq: Every day | ORAL | 11 refills | Status: DC

## 2022-08-25 NOTE — Progress Notes (Signed)
[facility]  PATIENT: Kristine [name] DOB: Aug 16, 1941  REFERRING DOCTOR OR PCP: Kristine RakersVeita Bland MD SOURCE: Patient, husband, notes from PCP  _________________________________   HISTORICAL  CHIEF COMPLAINT:  Chief Complaint  Patient presents with   Follow-up    RM 2116, with husband and daughter. Wakes up frequently. Lots of movement. Taking 1mg  clonazepam and 15mg  of remeron. Has some hallucinations during the night.    HISTORY OF PRESENT ILLNESS:  Kristine [name] is a 81 year old woman with sleep-talkingand active dreams x 1-2 yw\ears.    Update 08/25/2022 She and her family feels her gait is the same.   She is sometimes off balance and stumbles some.   Her stride is reduced but no falls.  Earlier this year she had a fall backwards.  She has a REM behavior disorder.    She had talking, yelling, kicking in her sleep.   Initially on lower dose clonazepam, these symptoms improved but gradually they worsened.   Increasing the dose to 1 mg only helped a bit.   She is waking up 2-3 times a night and talks and sometimes kicks her feet and occasionally gets out of bed.      She tolerates it well - no grogginess in am.   She takes naps most days.     She was down to 92 pounds and mirtazapine at night has helped her gain 7 pounds.      She is more confused and has had more memory loss over the last year.     She feels it is poor and she fluctuates a lot.     She has hallucinations hearing and seeing children that are not there.  She is having more pain in her back and legs and also has been losing weight.    Cognition is doing worse.   She saw Dr. Berline [name] and Dr. Parke [name].    She is also on duloxetine for the pain.    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  She has no tremors.  Gait is worse and more shuffling than a couple years ago.   The stride is reduced and she has had a couple falls over the past 2 years.  She has  HTN, CAD and hyperthyroidism.       06/02/2022   11:55 AM  MMSE - Mini Mental State Exam  Orientation to time 0  Orientation to Place 4  Registration 3  Attention/ Calculation 0  Recall 0  Language- name 2 objects 1  Language- repeat 1  Language- follow 3 step command 3  Language- read & follow direction 1  Write a sentence 0  Copy design 0  Total score 13     POLYSOMNOGRAM 01/08/2021 1.  No significant OSA throughout most of the night (AHI = 1.4).  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  Sleep talking during REM Sleep 3.  Rapid sleep onset (0 minutes) with sleep efficiency = 64%.  No N3 sleep recorded    EPWORTH SLEEPINESS SCALE  On a scale of 0 - 3 what is the chance of dozing:  Sitting and Reading:   2 Watching TV:    3 Sitting inactive in a public place: 0 Passenger in car for one hour: 3 Lying down to rest in the afternoon: 3 Sitting and talking to someone: 1 Sitting quietly after lunch:  3 In a car, stopped in traffic:  0  Total (out of 24):   15/24   Moderate excessive daytime sleepiness.      REVIEW OF SYSTEMS: Constitutional: No fevers, chills, sweats, or change in appetite Eyes: No visual changes, double vision, eye pain Ear, nose and throat: No hearing loss, ear pain, nasal congestion, sore throat Cardiovascular: No chest pain, palpitations Respiratory:  No shortness of breath at rest or with exertion.   No wheezes GastrointestinaI: No nausea, vomiting, diarrhea, abdominal pain, fecal incontinence Genitourinary:  No dysuria, urinary retention or frequency.  No nocturia. Musculoskeletal:  No neck pain, back pain Integumentary: No rash, pruritus, skin lesions Neurological: as above Psychiatric: No depression at this time.  No anxiety Endocrine: No palpitations, diaphoresis, change in appetite, change in weigh or increased thirst Hematologic/Lymphatic:  No anemia, purpura, petechiae. Allergic/Immunologic: No itchy/runny eyes, nasal  congestion, recent allergic reactions, rashes  ALLERGIES: Allergies  Allergen Reactions   Ace Inhibitors Swelling    Angioedema of the tongue   Iodinated Contrast Media Anaphylaxis   Other Anaphylaxis    Allergic to melons, nuts   Shellfish-Derived Products Anaphylaxis   Hydrocodone Other (See Comments)    unspecified    HOME MEDICATIONS:  Current Outpatient Medications:    aspirin EC 81 MG tablet, Take 81 mg by mouth daily., Disp: , Rfl:    atorvastatin (LIPITOR) 80 MG tablet, Take 1 tablet (80 mg total) by mouth daily., Disp: 90 tablet, Rfl: 2   BREZTRI AEROSPHERE 160-9-4.8 MCG/ACT AERO, Inhale 2 puffs into the lungs 2 (two) times daily., Disp: , Rfl:    chlorhexidine (PERIDEX) 0.12 % solution, as directed., Disp: , Rfl:    cyclobenzaprine (FLEXERIL) 5 MG tablet, Take 1-2 tablets (5-10 mg total) by mouth at bedtime as needed for muscle spasms., Disp: 60 tablet, Rfl: 2   diclofenac Sodium (VOLTAREN) 1 % GEL, Apply topically., Disp: , Rfl:    donepezil (ARICEPT) 5 MG tablet, TAKE 1 TABLET(5 MG) BY MOUTH AT BEDTIME, Disp: 90 tablet, Rfl: 1   DULoxetine (CYMBALTA) 30 MG capsule, Take 1 capsule (30 mg total) by mouth daily., Disp: 30 capsule, Rfl: 11   ezetimibe (ZETIA) 10 MG tablet, TAKE 1 TABLET(10 MG) BY MOUTH DAILY, Disp: 90 tablet, Rfl: 2   hydrALAZINE (APRESOLINE) 25 MG tablet, TAKE 1 TABLET(25 MG) BY MOUTH DAILY, Disp: 90 tablet, Rfl: 1   hydrochlorothiazide (HYDRODIURIL) 25 MG tablet, Take 1 tablet (25 mg total) by mouth daily., Disp: 90 tablet, Rfl: 1   levothyroxine (SYNTHROID) 50 MCG tablet, Take 50 mcg by mouth every morning., Disp: , Rfl:    melatonin 5 MG TABS, Take 5 mg by mouth at bedtime., Disp: , Rfl:    methimazole (TAPAZOLE) 5 MG tablet, Take 5 mg by mouth daily., Disp: , Rfl:    metoprolol succinate (TOPROL-XL) 50 MG 24 hr tablet, TAKE 1 TABLET(50 MG) BY MOUTH DAILY, Disp: 90 tablet, Rfl: 2   mirtazapine (REMERON) 15 MG tablet, 1/2 to 1 pill po qHS, Disp: 30 tablet,  Rfl: 5   nitroGLYCERIN (NITROSTAT) 0.3 MG SL tablet, Place 1 tablet (0.3 mg total) under the tongue every 5 (five) minutes as needed for chest pain., Disp: 25 tablet, Rfl: 3   potassium chloride (KLOR-CON) 10 MEQ tablet, Take 10 mEq by mouth 2 (two) times daily., Disp: , Rfl:    Vitamin D, Ergocalciferol, (DRISDOL) 1.25 MG (50000 UNIT) CAPS capsule, Take 50,000 Units by mouth every 7 (seven) days., Disp: , Rfl:    clonazePAM (KLONOPIN) 1 MG tablet, 1 pill  po qHS, Disp: 30 tablet, Rfl: 5  PAST MEDICAL HISTORY: Past Medical History:  Diagnosis Date   COPD (chronic obstructive pulmonary disease) (HCC)    Hard of hearing    History of anemia    History of blood transfusion    History of pneumonia    HTN (hypertension)    Hyperlipidemia    Hyperthyroidism    treated wtih radioactive iodine   OA (osteoarthritis of spine)    PVD (peripheral vascular disease) (HCC)    Lower extremity Dopplers  May 2revealed an ABI of 0.83 in the right posterior    Shortness of breath dyspnea    STEMI (ST elevation myocardial infarction) [facility]) May 2011   Promus DES in the proximal, mid and distal RCA in May 2011   Stroke [facility])    Urinary frequency    Urinary urgency     PAST SURGICAL HISTORY: Past Surgical History:  Procedure Laterality Date   ABDOMINAL HYSTERECTOMY     Had ovarian resection and required lysis of adhesions   CARDIAC CATHETERIZATION  04/07/2010   EF 60-70%   CORONARY STENTS     x3   ESOPHAGOGASTRODUODENOSCOPY N/A 03/10/2015   Procedure: ESOPHAGOGASTRODUODENOSCOPY (EGD);  Surgeon: Wandalee Ferdinand, MD;  Location: [facility] ENDOSCOPY;  Service: Endoscopy;  Laterality: N/A;   OVARY SURGERY     TOTAL HIP ARTHROPLASTY  2004   left   TOTAL HIP ARTHROPLASTY Right 01/23/2016   Procedure: RIGHT TOTAL HIP ARTHROPLASTY ANTERIOR APPROACH;  Surgeon: Loreta Ave, MD;  Location: [facility] OR;  Service: Orthopedics;  Laterality: Right;    FAMILY HISTORY: Family History  Problem Relation Age of Onset   Peripheral  vascular disease Mother    Congestive Heart Failure Mother    Heart attack Father     SOCIAL HISTORY:  Social History   Socioeconomic History   Marital status: Married    Spouse name: Cabin crew   Number of children: 3   Years of education: Masters   Highest education level: Not on file  Occupational History   Occupation: Retired  Tobacco Use   Smoking status: Former    Packs/day: 1.00    Years: 45.00    Total pack years: 45.00    Types: Cigarettes    Quit date: 04/07/2010    Years since quitting: 12.3   Smokeless tobacco: Never  Vaping Use   Vaping Use: Never used  Substance and Sexual Activity   Alcohol use: Yes    Comment: rarely    Drug use: No   Sexual activity: Not on file  Other Topics Concern   Not on file  Social History Narrative   Lives with husband, daughter and her son   Right handed   Tea sometimes, decaf coffee   Social Determinants of Health   Financial Resource Strain: Not on file  Food Insecurity: Not on file  Transportation Needs: Not on file  Physical Activity: Not on file  Stress: Not on file  Social Connections: Not on file  Intimate Partner Violence: Not on file     PHYSICAL EXAM  Vitals:   08/25/22 1307  BP: 111/77  Pulse: 66  Weight: 99 lb (44.9 kg)  Height:  (1.6 m)     Body mass index is 17.54 kg/m.   General: The patient is well-developed and well-nourished and in no acute distress  HEENT:  Head is [place]/AT.  Sclera are anicteric.  Skin: Extremities are without rash or  edema.  Neurologic Exam  Mental status: She  has reduced focus/attention and memory.  Speech is normal.  Cranial nerves: Extraocular movements are full.  Facial strength and sensation are normal.  No obvious hearing deficits are noted.  Motor:  Muscle bulk is normal.   Tone is slightly increased in the arms with mild cogwheeling.. Strength is  5 / 5 in all 4 extremities.   Sensory: Sensory testing is intact to pinprick, soft touch and vibration  sensation in all 4 extremities.  Coordination: Cerebellar testing reveals good finger-nose-finger and heel-to-shin bilaterally.  Gait and station: Station is normal.  The stride is reduced.  She turns 180 degrees in 6 steps.  She has retropulsion when the posture is disturbed.  Romberg is negative.  Reflexes: Deep tendon reflexes are symmetric and normal bilaterally.      DIAGNOSTIC DATA (LABS, IMAGING, TESTING) - I reviewed patient records, labs, notes, testing and imaging myself where available.  Lab Results  Component Value Date   WBC 5.6 05/15/2021   HGB 12.4 05/15/2021   HCT 37.5 05/15/2021   MCV 89.5 05/15/2021   PLT 271 05/15/2021      Component Value Date/Time   NA 138 05/14/2021 2143   K 3.2 (L) 05/14/2021 2143   CL 102 05/14/2021 2143   CO2 29 05/14/2021 2143   GLUCOSE 114 (H) 05/14/2021 2143   BUN 18 05/14/2021 2143   CREATININE 0.81 05/15/2021 0331   CALCIUM 9.0 05/14/2021 2143   PROT 7.1 05/14/2021 2143   PROT 7.6 08/31/2019 0000   ALBUMIN 3.5 05/14/2021 2143   ALBUMIN 4.5 08/31/2019 0000   AST 26 05/14/2021 2143   ALT 17 05/14/2021 2143   ALKPHOS 76 05/14/2021 2143   BILITOT 0.9 05/14/2021 2143   BILITOT 0.9 08/31/2019 0000   GFRNONAA >60 05/15/2021 0331   GFRAA >60 06/23/2020 2120   Lab Results  Component Value Date   CHOL 155 08/31/2019   HDL 59 08/31/2019   LDLCALC 85 08/31/2019   LDLDIRECT 138.9 07/23/2011   TRIG 51 08/31/2019   CHOLHDL 2.6 08/31/2019   Lab Results  Component Value Date   HGBA1C  05/30/2010    5.5 (NOTE)                                                                       According to the ADA Clinical Practice Recommendations for 2011, when HbA1c is used as a screening test:   >=6.5%   Diagnostic of Diabetes Mellitus           (if abnormal result  is confirmed)  5.7-6.4%   Increased risk of developing Diabetes Mellitus  References:Diagnosis and Classification of Diabetes Mellitus,Diabetes XTKW,4097,35(HGDJM 1):S62-S69 and  Standards of Medical Care in         Diabetes - 2011,Diabetes Care,2011,34  (Suppl 1):S11-S61.   No results found for: "VITAMINB12" Lab Results  Component Value Date   TSH 0.283 (L) 05/31/2015       ASSESSMENT AND PLAN  Lewy body dementia without behavioral disturbance, psychotic disturbance, mood disturbance, or anxiety, unspecified dementia severity (HCC)  REM behavioral disorder  Gait disturbance  Excessive daytime sleepiness  1.   I believe she has Lewy Body dementia.   RBD is her main issue also gait issues 2.  For the  REM behavior disorder, we will continue clonazepam 1 mg.  It is possible mirtazapine could be worsening the RBD so we will reduce mirtazapine to 7.5 mg  3.   Reduce duloxetine to 30 mg.  She has not been reporting much pain lately.  If going down does not worsen the pain she will discontinue.  It is possible that duloxetine can worsen REM behavior disorder. 2.    She will return to see [place] in 6 months or sooner if there are new or worsening neurologic symptoms.  Follow-up can be as needed if primary care is able to write for the clonazepam.    Chanell Nadeau A. Epimenio Foot, MD, [facility] 08/25/2022, 3:55 PM Certified in Neurology, Clinical Neurophysiology, Sleep Medicine and Neuroimaging  [facility] [address], [address] [place], [place] [address] [phone]

## 2022-08-27 ENCOUNTER — Telehealth: Payer: Self-pay | Admitting: Neurology

## 2022-08-27 NOTE — Telephone Encounter (Signed)
Pt's daughter states since the visit on the 18th and the suggested changes by Dr Felecia Shelling on some of the meds.  Pt reports to daughter that her coordination is different and her right leg feels different from left, daughter asking for a call to discuss.

## 2022-08-28 NOTE — Telephone Encounter (Signed)
LVM returning call. 

## 2022-09-10 ENCOUNTER — Encounter: Payer: Medicare PPO | Attending: Physical Medicine and Rehabilitation | Admitting: Physical Medicine and Rehabilitation

## 2022-09-10 ENCOUNTER — Encounter: Payer: Self-pay | Admitting: Physical Medicine and Rehabilitation

## 2022-09-10 VITALS — BP 120/79 | HR 76 | Ht 63.0 in | Wt 99.0 lb

## 2022-09-10 DIAGNOSIS — F0280 Dementia in other diseases classified elsewhere without behavioral disturbance: Secondary | ICD-10-CM | POA: Diagnosis not present

## 2022-09-10 DIAGNOSIS — M5416 Radiculopathy, lumbar region: Secondary | ICD-10-CM

## 2022-09-10 DIAGNOSIS — R296 Repeated falls: Secondary | ICD-10-CM | POA: Diagnosis not present

## 2022-09-10 DIAGNOSIS — G3183 Dementia with Lewy bodies: Secondary | ICD-10-CM

## 2022-09-10 NOTE — Progress Notes (Signed)
Subjective:    Patient ID: Kristine [name], female    DOB: Nov 03, 1941, 81 y.o.   MRN: [id number]  HPI  Pt is a 81 yr old female with hx of L THR in 2017; asthma, COPD; CAD s/p stents; hx of stroke- no weakness; HTN, and HLD; chronic low back pain. 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   Was Dx'd with Lewy Body Dementia since saw her last.   Pain is now in legs/thighs- goes down to calves. Down to feet  Hasn't been able to get more H/H-  Insurance denied it.   Husband has been trying to do HEP- mostly walking and some leg exercises.  Complains about leg exercises due to pain.    Had 4 falls since she saw me last- outside in yard and at a restaurant.  At [facility].  2 at [facility] house on stairs- more minor.  No injuries/fractures at any of the falls.   Baby daughter died 20 months ago. Was in hospice.   Night time- slightly improved- just a little with reduction in Duloxetine Dr Epimenio Foot reduced Duloxetine  to 30 mg daily.   Pain Inventory Average Pain 7 Pain Right Now 5 My pain is intermittent and burning  In the last 24 hours, has pain interfered with the following? General activity 1 Relation with others 0 Enjoyment of life 0 What TIME of day is your pain at its worst? evening Sleep (in general) Fair  Pain is worse with:  getting up and rest Pain improves with: medication Relief from Meds: 7  Family History  Problem Relation Age of Onset   Peripheral vascular disease Mother    Congestive Heart Failure Mother    Heart attack Father    Social History   Socioeconomic History   Marital status: Married    Spouse name: Cabin crew   Number of children: 3   Years of education: Masters   Highest education level: Not on file  Occupational History   Occupation: Retired  Tobacco Use   Smoking status: Former    Packs/day: 1.00    Years: 45.00    Total pack years:  45.00    Types: Cigarettes    Quit date: 04/07/2010    Years since quitting: 12.4   Smokeless tobacco: Never  Vaping Use   Vaping Use: Never used  Substance and Sexual Activity   Alcohol use: Yes    Comment: rarely    Drug use: No   Sexual activity: Not on file  Other Topics Concern   Not on file  Social History Narrative   Lives with husband, daughter and her son   Right handed   Tea sometimes, decaf coffee   Social Determinants of Health   Financial Resource Strain: Not on file  Food Insecurity: Not on file  Transportation Needs: Not on file  Physical Activity: Not on file  Stress: Not on file  Social Connections: Not on file   Past Surgical History:  Procedure Laterality Date   ABDOMINAL HYSTERECTOMY     Had ovarian resection and required lysis of adhesions   CARDIAC CATHETERIZATION  04/07/2010   EF 60-70%   CORONARY STENTS     x3   ESOPHAGOGASTRODUODENOSCOPY N/A 03/10/2015   Procedure: ESOPHAGOGASTRODUODENOSCOPY (EGD);  Surgeon: Wandalee Ferdinand, MD;  Location: [facility] ENDOSCOPY;  Service: Endoscopy;  Laterality: N/A;   OVARY SURGERY     TOTAL HIP ARTHROPLASTY  2004  left   TOTAL HIP ARTHROPLASTY Right 01/23/2016   Procedure: RIGHT TOTAL HIP ARTHROPLASTY ANTERIOR APPROACH;  Surgeon: Loreta Ave, MD;  Location: [facility] OR;  Service: Orthopedics;  Laterality: Right;   Past Surgical History:  Procedure Laterality Date   ABDOMINAL HYSTERECTOMY     Had ovarian resection and required lysis of adhesions   CARDIAC CATHETERIZATION  04/07/2010   EF 60-70%   CORONARY STENTS     x3   ESOPHAGOGASTRODUODENOSCOPY N/A 03/10/2015   Procedure: ESOPHAGOGASTRODUODENOSCOPY (EGD);  Surgeon: Wandalee Ferdinand, MD;  Location: [facility] ENDOSCOPY;  Service: Endoscopy;  Laterality: N/A;   OVARY SURGERY     TOTAL HIP ARTHROPLASTY  2004   left   TOTAL HIP ARTHROPLASTY Right 01/23/2016   Procedure: RIGHT TOTAL HIP ARTHROPLASTY ANTERIOR APPROACH;  Surgeon: Loreta Ave, MD;  Location: [facility] OR;  Service: Orthopedics;   Laterality: Right;   Past Medical History:  Diagnosis Date   COPD (chronic obstructive pulmonary disease) (HCC)    Hard of hearing    History of anemia    History of blood transfusion    History of pneumonia    HTN (hypertension)    Hyperlipidemia    Hyperthyroidism    treated wtih radioactive iodine   OA (osteoarthritis of spine)    PVD (peripheral vascular disease) (HCC)    Lower extremity Dopplers  May 2revealed an ABI of 0.83 in the right posterior    Shortness of breath dyspnea    STEMI (ST elevation myocardial infarction) [facility]) May 2011   Promus DES in the proximal, mid and distal RCA in May 2011   Stroke [facility])    Urinary frequency    Urinary urgency    BP 120/79   Pulse 76   Ht 5\' 3"  (1.6 m)   Wt 99 lb (44.9 kg)   BMI 17.54 kg/m   Opioid Risk Score:   Fall Risk Score:  `1  Depression screen [facility] 2/9     05/16/2022    9:35 AM 02/05/2022    9:35 AM  Depression screen PHQ 2/9  Decreased Interest 0 0  Down, Depressed, Hopeless 0 0  PHQ - 2 Score 0 0  Altered sleeping  0  Tired, decreased energy  0  Change in appetite  0  Feeling bad or failure about yourself   0  Trouble concentrating  1  Moving slowly or fidgety/restless  0  Suicidal thoughts  0  PHQ-9 Score  1      Review of Systems  Musculoskeletal:        Bilateral leg pain  All other systems reviewed and are negative.     Objective:   Physical Exam  Weight 99 lbs- up from 92 lbs Awake, some more interaction today then normal, but hsuabd does most of talking Poor balance when standing- esp propulsion backwards Cannot stay upright with eyes closed      Assessment & Plan:   Pt is a 81 yr old female with hx of L THR in 2017; asthma, COPD; CAD s/p stents; hx of stroke- no weakness; HTN, and HLD; chronic low back pain. 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   Needs to do walking 20 minutes at  least 5 days/week and do leg exercises 5-10 minutes for 5 days/week minimum! Cannot get better if don't do exercises.   2.  Will need to try to get H/H for pt- have ordered-  but insurance denied.  Will send in again- if it get denied, will need claim number tso I can appeal- Pt REALLY needs due to frequent loss of balance.     3. Dr Felecia Shelling reduced Duloxetine to 30 mg daily- due to REM behavior issue-   - try 60 mg/ so 2 of 30 mg for 4-7 days- see if night time stuff gets worse- if it does- then can try 40 mg nightly-  - because having worsening pain.   4. Discussed options- of Changing Duloxetine- 30 mg  vs 40 mg vs 60 mg daily- at length  5 Also discussed hospice vs H/H.-decided on H/H -will try again as above.   6.  F/U in 71months  I spent a total of 42   minutes on total care today- >50% coordination of care- due to discussion of lewy body disease- and wieght and balance and night behaviors

## 2022-09-10 NOTE — Patient Instructions (Signed)
Pt is a 81 yr old female with hx of L THR in 2017; asthma, COPD; CAD s/p stents; hx of stroke- no weakness; HTN, and HLD; chronic low back pain. 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   Needs to do walking 20 minutes at least 5 days/week and do leg exercises 5-10 minutes for 5 days/week minimum! Cannot get better if don't do exercises.   2.  Will need to try to get H/H for pt- have ordered- but insurance denied.  Will send in again- if it get denied, will need claim number tso I can appeal- Pt REALLY needs due to frequent loss of balance.     3. Dr Felecia Shelling reduced Duloxetine to 30 mg daily- due to REM behavior issue-   - try 60 mg/ so 2 of 30 mg for 4-7 days- see if night time stuff gets worse- if it does- then can try 40 mg nightly-  - because having worsening pain.   4. Discussed options- of Changing Duloxetine- 30 mg  vs 40 mg vs 60 mg daily- at length  5 Also discussed hospice vs H/H.-decided on H/H -will try again as above.   6.  F/U in 8months

## 2022-09-16 ENCOUNTER — Telehealth: Payer: Self-pay

## 2022-09-16 ENCOUNTER — Other Ambulatory Visit: Payer: Self-pay | Admitting: Physical Medicine and Rehabilitation

## 2022-09-16 MED ORDER — DULOXETINE HCL 60 MG PO CPEP: 60.0000 mg | ORAL_CAPSULE | Freq: Every day | ORAL | 3 refills | Status: DC

## 2022-09-16 NOTE — Telephone Encounter (Signed)
Dr. Dagoberto Ligas is out of the office this week:  Ravyn Nikkel Duloxetine 30 MG dose was increased on the last visit. Is it possible for you to send in a new script? To reflex the dose increase. Please send [facility] to [employer].  Thank you.  (Please share the load I have been sending a lot to [facility] NP today).

## 2022-09-16 NOTE — Telephone Encounter (Signed)
New Duloxetine 60 MG sent to the pharmacy by Dr. Durward Fortes. Kristine [name] has been informed and voice understanding of the new script.   Kristine [name] the new dose is working well. However she does have swelling in both ankles. He was advised to call the PCP.  (Dr. Dagoberto Ligas is out of the office this week).

## 2022-09-17 ENCOUNTER — Telehealth: Payer: Self-pay

## 2022-09-17 ENCOUNTER — Telehealth: Payer: Self-pay | Admitting: *Deleted

## 2022-09-17 NOTE — Telephone Encounter (Signed)
Kristine [name]. Checked VM in sleep lab. Husband left generic message stating pt needed refill on medication. I called husband. He states Dr. Dagoberto Ligas prescribes duloxetine. They were able to get refill request resolved by Dr. Imelda Pillow office. Nothing further needed.

## 2022-09-17 NOTE — Telephone Encounter (Signed)
Office visit notes faxed to appeal the needed Home Health services and evaluation.  Faxed to [facility] -[phone].

## 2022-09-23 NOTE — Telephone Encounter (Signed)
Mr. Pomales would like to speak to you about recent denial. Call back phone [phone].

## 2022-09-30 ENCOUNTER — Ambulatory Visit: Payer: Medicare PPO | Admitting: Podiatry

## 2022-09-30 DIAGNOSIS — I739 Peripheral vascular disease, unspecified: Secondary | ICD-10-CM

## 2022-09-30 DIAGNOSIS — M79675 Pain in left toe(s): Secondary | ICD-10-CM

## 2022-09-30 DIAGNOSIS — B351 Tinea unguium: Secondary | ICD-10-CM

## 2022-09-30 DIAGNOSIS — M79674 Pain in right toe(s): Secondary | ICD-10-CM

## 2022-09-30 DIAGNOSIS — Q828 Other specified congenital malformations of skin: Secondary | ICD-10-CM

## 2022-09-30 NOTE — Progress Notes (Addendum)
This patient returns to my office for at risk foot care.  This patient requires this care by a professional since this patient will be at risk due to having PAD, PVD.  This patient has painful callus on her left forefoot.  This callus is painful walking and wearing her shoes.  This patient presents for at risk foot care today.  General Appearance  Alert, conversant and in no acute stress.  Vascular  Dorsalis pedis and posterior tibial  pulses are weakly  palpable  bilaterally.  Capillary return is within normal limits  bilaterally. Temperature is within normal limits  bilaterally.  Neurologic  Senn-Weinstein monofilament wire test within normal limits  bilaterally. Muscle power within normal limits bilaterally.  Nails Thick disfigured discolored nails with subungual debris  from hallux to fifth toes bilaterally. No evidence of bacterial infection or drainage bilaterally.  Orthopedic  No limitations of motion  feet .  No crepitus or effusions noted.  No bony pathology or digital deformities noted.  Skin  normotropic skin noted bilaterally.  No signs of infections or ulcers noted.   Porokeratosis sub 3 left foot.    Porokeratosis sub 3 left foot.   Consent was obtained for treatment procedures.   Debridement of porokeratosis with # 15 blade.     Return office visit    9 weeks                  Told patient to return for periodic foot care and evaluation due to potential at risk complications.  Padding dispensed for dispersion padding.   Gardiner Barefoot DPM

## 2022-10-11 ENCOUNTER — Encounter (HOSPITAL_COMMUNITY): Payer: Self-pay | Admitting: Emergency Medicine

## 2022-10-11 ENCOUNTER — Other Ambulatory Visit: Payer: Self-pay

## 2022-10-11 ENCOUNTER — Emergency Department (HOSPITAL_COMMUNITY)
Admission: EM | Admit: 2022-10-11 | Discharge: 2022-10-12 | Disposition: A | Discharge status: Home / Self Care | Payer: Medicare PPO | Attending: Emergency Medicine | Admitting: Emergency Medicine

## 2022-10-11 DIAGNOSIS — Z7982 Long term (current) use of aspirin: Secondary | ICD-10-CM | POA: Insufficient documentation

## 2022-10-11 DIAGNOSIS — Z79899 Other long term (current) drug therapy: Secondary | ICD-10-CM | POA: Diagnosis not present

## 2022-10-11 DIAGNOSIS — M7989 Other specified soft tissue disorders: Secondary | ICD-10-CM | POA: Diagnosis not present

## 2022-10-11 DIAGNOSIS — R600 Localized edema: Secondary | ICD-10-CM | POA: Insufficient documentation

## 2022-10-11 DIAGNOSIS — R2243 Localized swelling, mass and lump, lower limb, bilateral: Secondary | ICD-10-CM | POA: Diagnosis not present

## 2022-10-11 LAB — BASIC METABOLIC PANEL
Anion gap: 9 (ref 5–15)
BUN: 15 mg/dL (ref 8–23)
CO2: 24 mmol/L (ref 22–32)
Calcium: 9 mg/dL (ref 8.9–10.3)
Chloride: 108 mmol/L (ref 98–111)
Creatinine, Ser: 0.77 mg/dL (ref 0.44–1.00)
GFR, Estimated: >60 mL/min (ref >60)
Glucose, Bld: 88 mg/dL (ref 70–99)
Potassium: 3.4 mmol/L — ABNORMAL LOW (ref 3.5–5.1)
Sodium: 141 mmol/L (ref 135–145)

## 2022-10-11 LAB — CBC
HCT: 35.1 % — ABNORMAL LOW (ref 36.0–46.0)
Hemoglobin: 11.2 g/dL — ABNORMAL LOW (ref 12.0–15.0)
MCH: 29.1 pg (ref 26.0–34.0)
MCHC: 31.9 g/dL (ref 30.0–36.0)
MCV: 91.2 fL (ref 80.0–100.0)
Platelets: 300 10*3/uL (ref 150–400)
RBC: 3.85 MIL/uL — ABNORMAL LOW (ref 3.87–5.11)
RDW: 16.4 % — ABNORMAL HIGH (ref 11.5–15.5)
WBC: 5.8 10*3/uL (ref 4.0–10.5)
nRBC: 0 % (ref 0.0–0.2)

## 2022-10-11 MED ORDER — ACETAMINOPHEN 500 MG PO TABS
500.0000 mg | ORAL_TABLET | Freq: Once | ORAL | Status: AC
  Administered 2022-10-11: 500 mg via ORAL
  Filled 2022-10-11: qty 1

## 2022-10-11 NOTE — ED Provider Triage Note (Signed)
Emergency Medicine Provider Triage Evaluation Note  Kristine [name] , a 81 y.o. female  was evaluated in triage.  Pt complains of lower extremity swelling.  Patient notes 3-week history of bilateral lower extremity swelling.  Left greater than right.  History of dementia and not mobile at baseline.  Denies history of DVT/PE.  Denies fever, chills, night sweats, cough, congestion, chest pain, shortness of breath.  Denies any known trauma.  Review of Systems  Positive:  Negative:   Physical Exam  BP 100/69   Pulse 66   Temp 98.7 F (37.1 C) (Oral)   Resp 18   Wt 46.3 kg   SpO2 99%   BMI 18.07 kg/m  Gen:   Awake, no distress   Resp:  Normal effort  MSK:   Moves extremities without difficulty  Other:  1-2+ left lower extremity edema 0-1+ right lower extremity edema.  Medical Decision Making  Medically screening exam initiated at 10:49 PM.  Appropriate orders placed.  Kristine [name] was informed that the remainder of the evaluation will be completed by another provider, this initial triage assessment does not replace that evaluation, and the importance of remaining in the ED until their evaluation is complete.     Wilnette Kales, [place] 10/11/22 2250

## 2022-10-11 NOTE — ED Triage Notes (Signed)
Pt in with husband, c/o bilateral leg swelling x a few weeks. Denies any sob or cp.

## 2022-10-12 LAB — HEPATIC FUNCTION PANEL
ALT: 43 U/L (ref 0–44)
AST: 51 U/L — ABNORMAL HIGH (ref 15–41)
Albumin: 3.5 g/dL (ref 3.5–5.0)
Alkaline Phosphatase: 108 U/L (ref 38–126)
Bilirubin, Direct: 0.1 mg/dL (ref 0.0–0.2)
Indirect Bilirubin: 0.5 mg/dL (ref 0.3–0.9)
Total Bilirubin: 0.6 mg/dL (ref 0.3–1.2)
Total Protein: 6.6 g/dL (ref 6.5–8.1)

## 2022-10-12 LAB — D-DIMER, QUANTITATIVE: D-Dimer, Quant: 0.51 ug/mL-FEU — ABNORMAL HIGH (ref 0.00–0.50)

## 2022-10-12 LAB — BRAIN NATRIURETIC PEPTIDE: B Natriuretic Peptide: 133.1 pg/mL — ABNORMAL HIGH (ref 0.0–100.0)

## 2022-10-12 NOTE — Discharge Instructions (Signed)
Try to elevated your legs above your heart when not up and moving around.  Follow up with your doctor in the office.

## 2022-10-12 NOTE — ED Provider Notes (Signed)
[facility]EMERGENCY DEPT Provider Note   CSN: [id number] Arrival date & time: 10/11/22  2104     History  Chief Complaint  Patient presents with   Leg Swelling    Kristine [name] is a 81 y.o. female.  81 yo F With a chief complaints of bilateral leg swelling.  This been going on for couple weeks.  Per the patient and husband they think it has been going on since she had dental work done.  She had had her lower teeth cleaned one side and then the other.  She has had some medications reduced but does not think that she has had any added.  She has a new toothpaste and a new mouthwash.  She has been feeling like she needs to spit constantly like something is stuck in her mouth.  She has had some loose bowel movements since being in the ED today.  She denies any difficulty breathing.  Denies orthopnea or PND.  Denies chest pain or pressure.  Denies any urinary symptoms.        Home Medications Prior to Admission medications   Medication Sig Start Date End Date Taking? Authorizing Provider  aspirin EC 81 MG tablet Take 81 mg by mouth daily.    [provider]  atorvastatin (LIPITOR) 80 MG tablet Take 1 tablet (80 mg total) by mouth daily. 02/24/22   Kathleene Hazel, MD  BREZTRI AEROSPHERE 160-9-4.8 MCG/ACT AERO Inhale 2 puffs into the lungs 2 (two) times daily. 10/17/20   [provider]  chlorhexidine (PERIDEX) 0.12 % solution as directed. 06/05/22   [provider]  clonazePAM Scarlette Calico) 1 MG tablet 1 pill po qHS 08/25/22   Sater, Pearletha Furl, MD  cyclobenzaprine (FLEXERIL) 5 MG tablet Take 1-2 tablets (5-10 mg total) by mouth at bedtime as needed for muscle spasms. 02/05/22   Lovorn, Aundra Millet, MD  diclofenac Sodium (VOLTAREN) 1 % GEL Apply topically. 09/19/21   [provider]  donepezil (ARICEPT) 5 MG tablet TAKE 1 TABLET(5 MG) BY MOUTH AT BEDTIME 06/03/22   Sater, Pearletha Furl, MD  DULoxetine (CYMBALTA) 60 MG capsule Take 1 capsule  (60 mg total) by mouth daily. 09/16/22   Raulkar, Drema Pry, MD  ezetimibe (ZETIA) 10 MG tablet TAKE 1 TABLET(10 MG) BY MOUTH DAILY 05/13/22   Kathleene Hazel, MD  hydrALAZINE (APRESOLINE) 25 MG tablet TAKE 1 TABLET(25 MG) BY MOUTH DAILY 07/14/22   Alver Sorrow, NP  hydrochlorothiazide (HYDRODIURIL) 25 MG tablet Take 1 tablet (25 mg total) by mouth daily. 08/04/22   Kathleene Hazel, MD  levothyroxine (SYNTHROID) 50 MCG tablet Take 50 mcg by mouth every morning. 05/20/22   [provider]  melatonin 5 MG TABS Take 5 mg by mouth at bedtime.    [provider]  methimazole (TAPAZOLE) 5 MG tablet Take 5 mg by mouth daily.    [provider]  metoprolol succinate (TOPROL-XL) 50 MG 24 hr tablet TAKE 1 TABLET(50 MG) BY MOUTH DAILY 05/08/22   Kathleene Hazel, MD  mirtazapine (REMERON) 15 MG tablet 1/2 to 1 pill po qHS 08/04/22   Sater, Pearletha Furl, MD  nitroGLYCERIN (NITROSTAT) 0.3 MG SL tablet Place 1 tablet (0.3 mg total) under the tongue every 5 (five) minutes as needed for chest pain. 01/02/22   Kathleene Hazel, MD  potassium chloride (KLOR-CON) 10 MEQ tablet Take 10 mEq by mouth 2 (two) times daily. 04/02/22   [provider]  Vitamin D, Ergocalciferol, (DRISDOL)  1.25 MG (50000 UNIT) CAPS capsule Take 50,000 Units by mouth every 7 (seven) days.    [provider]      Allergies    Ace inhibitors, Iodinated contrast media, Other, Shellfish-derived products, and Hydrocodone    Review of Systems   Review of Systems  Physical Exam Updated Vital Signs BP (!) 103/59 (BP Location: Right Arm)   Pulse 66   Temp 97.8 F (36.6 C) (Oral)   Resp 16   Wt 46.3 kg   SpO2 97%   BMI 18.07 kg/m  Physical Exam  ED Results / Procedures / Treatments   Labs (all labs ordered are listed, but only abnormal results are displayed) Labs Reviewed  BASIC METABOLIC PANEL - Abnormal; Notable for the following components:      Result Value    Potassium 3.4 (*)    All other components within normal limits  CBC - Abnormal; Notable for the following components:   RBC 3.85 (*)    Hemoglobin 11.2 (*)    HCT 35.1 (*)    RDW 16.4 (*)    All other components within normal limits  D-DIMER, QUANTITATIVE - Abnormal; Notable for the following components:   D-Dimer, Quant 0.51 (*)    All other components within normal limits  BRAIN NATRIURETIC PEPTIDE - Abnormal; Notable for the following components:   B Natriuretic Peptide 133.1 (*)    All other components within normal limits  HEPATIC FUNCTION PANEL - Abnormal; Notable for the following components:   AST 51 (*)    All other components within normal limits    EKG None  Radiology No results found.  Procedures Procedures    Medications Ordered in ED Medications  acetaminophen (TYLENOL) tablet 500 mg (500 mg Oral Given 10/11/22 2351)    ED Course/ Medical Decision Making/ A&P                           Medical Decision Making Amount and/or Complexity of Data Reviewed Labs: ordered.   81 yo  F With a chief complaints of bilateral lower extremity edema.  This been going on for couple weeks.  Coincided with some dental work, has had some reduction of some medications at home.  Neither which seem to be a likely cause of her symptoms.  She does spend a lot of time in a chair per the husband.  Denies any heart failure symptoms.  She was seen in the quick look process here, had a D-dimer that was age-adjusted negative.  Her hemoglobin is dropped 1 g since last check a year ago.  Renal function at baseline.  We will add on LFTs and a BNP.  BNP is less than 200.  LFTs without elevation.  We will have the patient follow-up with her PCP in the office.  3:44 AM:  I have discussed the diagnosis/risks/treatment options with the patient and family.  Evaluation and diagnostic testing in the emergency department does not suggest an emergent condition requiring admission or immediate  intervention beyond what has been performed at this time.  They will follow up with PCP. We also discussed returning to the ED immediately if new or worsening sx occur. We discussed the sx which are most concerning (e.g., sudden worsening pain, fever, inability to tolerate by mouth, sob, orthopnea) that necessitate immediate return. Medications administered to the patient during their visit and any new prescriptions provided to the patient are listed below.  Medications given during this  visit Medications  acetaminophen (TYLENOL) tablet 500 mg (500 mg Oral Given 10/11/22 2351)     The patient appears reasonably screen and/or stabilized for discharge and I doubt any other medical condition or other [facility] requiring further screening, evaluation, or treatment in the ED at this time prior to discharge.          Final Clinical Impression(s) / ED Diagnoses Final diagnoses:  Leg swelling    Rx / DC Orders ED Discharge Orders     None         Deno Etienne, DO 10/12/22 [phone]

## 2022-10-15 DIAGNOSIS — R600 Localized edema: Secondary | ICD-10-CM | POA: Diagnosis not present

## 2022-10-15 DIAGNOSIS — F01511 Vascular dementia, unspecified severity, with agitation: Secondary | ICD-10-CM | POA: Diagnosis not present

## 2022-10-15 DIAGNOSIS — Z0000 Encounter for general adult medical examination without abnormal findings: Secondary | ICD-10-CM | POA: Diagnosis not present

## 2022-11-06 DIAGNOSIS — I10 Essential (primary) hypertension: Secondary | ICD-10-CM | POA: Diagnosis not present

## 2022-11-06 DIAGNOSIS — E782 Mixed hyperlipidemia: Secondary | ICD-10-CM | POA: Diagnosis not present

## 2022-11-06 DIAGNOSIS — E1169 Type 2 diabetes mellitus with other specified complication: Secondary | ICD-10-CM | POA: Diagnosis not present

## 2022-11-11 ENCOUNTER — Other Ambulatory Visit: Payer: Self-pay | Admitting: *Deleted

## 2022-11-11 DIAGNOSIS — I25118 Atherosclerotic heart disease of native coronary artery with other forms of angina pectoris: Secondary | ICD-10-CM

## 2022-11-11 DIAGNOSIS — E782 Mixed hyperlipidemia: Secondary | ICD-10-CM

## 2022-11-11 MED ORDER — ATORVASTATIN CALCIUM 80 MG PO TABS: 80.0000 mg | ORAL_TABLET | Freq: Every day | ORAL | 0 refills | Status: DC

## 2022-11-13 ENCOUNTER — Other Ambulatory Visit: Payer: Self-pay

## 2022-11-13 DIAGNOSIS — I25118 Atherosclerotic heart disease of native coronary artery with other forms of angina pectoris: Secondary | ICD-10-CM

## 2022-11-13 DIAGNOSIS — E782 Mixed hyperlipidemia: Secondary | ICD-10-CM

## 2022-11-13 MED ORDER — ATORVASTATIN CALCIUM 80 MG PO TABS: 80.0000 mg | ORAL_TABLET | Freq: Every day | ORAL | 0 refills | Status: DC

## 2022-11-17 NOTE — Progress Notes (Deleted)
Cardiology Office Note:    Date:  11/17/2022   ID:  Kristine [name], DOB 06-27-1941, MRN [id number]  PCP:  Kristine Rakers, MD   [facility] Providers Cardiologist:  Kristine Carrow, MD { Click to update primary MD,subspecialty MD or APP then REFRESH:1}    Referring MD: Kristine Rakers, MD   Chief Complaint: ***  History of Present Illness:    Kristine [name] is a *** 81 y.o. female with a hx of CAD, HTN, hyperlipidemia, COPD, TIA, dementia, and PAD  [place] STEMI 2011 treated with 3 drug-eluting stents in the proximal, mid and distal RCA.  She is known to have anomalous takeoff of all coronaries.  Past October 2014 with no ischemia.  Abnormal LE arterial Dopplers May 2011, initially seen by Dr. Hart Rochester but has had serial LE arterial testing in our office since.  Was referred to our PVD clinic by Dr. Gery [name] December 2018 and felt that her toe pain was not vascular in nature.  He planned to follow her in [facility] clinic.  ABI December 2022 unchanged.  Seen in the ED June 2022 with chest pain, troponin negative.  V EF 60 to 65%, no significant valve disease.  Her chest pain was felt not to be cardiac related.  She was given a prescription for Protonix but did not take it.  Last cardiology clinic visit was 12/27/21 with Dr. Clifton [name] at which time she reported she was not having any chest pain.  No changes were made to her treatment regimen and 1 year follow-up was recommended.  Today, she is here  Past Medical History:  Diagnosis Date   COPD (chronic obstructive pulmonary disease) (HCC)    Hard of hearing    History of anemia    History of blood transfusion    History of pneumonia    HTN (hypertension)    Hyperlipidemia    Hyperthyroidism    treated wtih radioactive iodine   OA (osteoarthritis of spine)    PVD (peripheral vascular disease) (HCC)    Lower extremity Dopplers  May 2revealed an ABI of 0.83 in the right posterior    Shortness of breath dyspnea    STEMI (ST elevation  myocardial infarction) [facility]) May 2011   Promus DES in the proximal, mid and distal RCA in May 2011   Stroke [facility])    Urinary frequency    Urinary urgency     Past Surgical History:  Procedure Laterality Date   ABDOMINAL HYSTERECTOMY     Had ovarian resection and required lysis of adhesions   CARDIAC CATHETERIZATION  04/07/2010   EF 60-70%   CORONARY STENTS     x3   ESOPHAGOGASTRODUODENOSCOPY N/A 03/10/2015   Procedure: ESOPHAGOGASTRODUODENOSCOPY (EGD);  Surgeon: Kristine Ferdinand, MD;  Location: [facility] ENDOSCOPY;  Service: Endoscopy;  Laterality: N/A;   OVARY SURGERY     TOTAL HIP ARTHROPLASTY  2004   left   TOTAL HIP ARTHROPLASTY Right 01/23/2016   Procedure: RIGHT TOTAL HIP ARTHROPLASTY ANTERIOR APPROACH;  Surgeon: Kristine Ave, MD;  Location: [facility] OR;  Service: Orthopedics;  Laterality: Right;    Current Medications: No outpatient medications have been marked as taking for the 11/18/22 encounter (Appointment) with Kristine [name], Kristine George, NP.     Allergies:   Ace inhibitors, Iodinated contrast media, Other, Shellfish-derived products, and Hydrocodone   Social History   Socioeconomic History   Marital status: Married    Spouse name: Colen   Number of children: 3   Years of education: Masters  Highest education level: Not on file  Occupational History   Occupation: Retired  Tobacco Use   Smoking status: Former    Packs/day: 1.00    Years: 45.00    Total pack years: 45.00    Types: Cigarettes    Quit date: 04/07/2010    Years since quitting: 12.6   Smokeless tobacco: Never  Vaping Use   Vaping Use: Never used  Substance and Sexual Activity   Alcohol use: Yes    Comment: rarely    Drug use: No   Sexual activity: Not on file  Other Topics Concern   Not on file  Social History Narrative   Lives with husband, daughter and her son   Right handed   Tea sometimes, decaf coffee   Social Determinants of Health   Financial Resource Strain: Not on file  Food Insecurity: Not on file   Transportation Needs: Not on file  Physical Activity: Not on file  Stress: Not on file  Social Connections: Not on file     Family History: The patient's ***family history includes Congestive Heart Failure in her mother; Heart attack in her father; Peripheral vascular disease in her mother.  ROS:   Please see the history of present illness.    *** All other systems reviewed and are negative.  Labs/Other Studies Reviewed:    The following studies were reviewed today:  VAS [place] ABI 12/04/21 Right ABIs appear increased compared to prior study on 12/04/20. Left ABIs  appear essentially unchanged compared to prior study on 12/04/20.    Summary:  Right: Resting right ankle-brachial index indicates mild right lower  extremity arterial disease. The right toe-brachial index is abnormal.   Left: Resting left ankle-brachial index indicates mild left lower  extremity arterial disease. The left toe-brachial index is abnormal.      *See table(s) above for measurements and observations.     Echo 05/15/21  1. Left ventricular ejection fraction, by estimation, is 60 to 65%. The  left ventricle has normal function. The left ventricle has no regional  wall motion abnormalities. Left ventricular diastolic parameters are  consistent with Grade I diastolic  dysfunction (impaired relaxation).   2. Right ventricular systolic function is normal. The right ventricular  size is normal. There is normal pulmonary artery systolic pressure. The  estimated right ventricular systolic pressure is 23.6 mmHg.   3. The mitral valve is normal in structure. No evidence of mitral valve  regurgitation. No evidence of mitral stenosis.   4. The aortic valve is calcified. Aortic valve regurgitation is not  visualized. Mild to moderate aortic valve sclerosis/calcification is  present, without any evidence of aortic stenosis.   5. The inferior vena cava is normal in size with greater than 50%  respiratory  variability, suggesting right atrial pressure of 3 mmHg.  Recent Labs: 10/11/2022: ALT 43; B Natriuretic Peptide 133.1; BUN 15; Creatinine, Ser 0.77; Hemoglobin 11.2; Platelets 300; Potassium 3.4; Sodium 141  Recent Lipid Panel    Component Value Date/Time   CHOL 155 08/31/2019 0000   TRIG 51 08/31/2019 0000   HDL 59 08/31/2019 0000   CHOLHDL 2.6 08/31/2019 0000   CHOLHDL 3 07/31/2014 1153   VLDL 20.6 07/31/2014 1153   LDLCALC 85 08/31/2019 0000   LDLDIRECT 138.9 07/23/2011 0815     Risk Assessment/Calculations:   {Does this patient have ATRIAL FIBRILLATION?[phone]}       Physical Exam:    VS:  There were no vitals taken for this visit.  Wt Readings from Last 3 Encounters:  10/11/22 102 lb (46.3 kg)  09/10/22 99 lb (44.9 kg)  08/25/22 99 lb (44.9 kg)     GEN: *** Well nourished, well developed in no acute distress HEENT: Normal NECK: No JVD; No carotid bruits CARDIAC: ***RRR, no murmurs, rubs, gallops RESPIRATORY:  Clear to auscultation without rales, wheezing or rhonchi  ABDOMEN: Soft, non-tender, non-distended MUSCULOSKELETAL:  No edema; No deformity. *** pedal pulses, ***bilaterally SKIN: Warm and dry NEUROLOGIC:  Alert and oriented x 3 PSYCHIATRIC:  Normal affect   EKG:  EKG is *** ordered today.  The ekg ordered today demonstrates ***  No BP recorded.  {Refresh Note OR Click here to enter BP  :1}***    Diagnoses:    No diagnosis found. Assessment and Plan:     CAD: PAD: Hyperlipidemia LDL goal < 70:  {Are you ordering a CV Procedure (e.g. stress test, cath, DCCV, TEE, etc)?   Press F2        :[id number]}   Disposition:  Medication Adjustments/Labs and Tests Ordered: Current medicines are reviewed at length with the patient today.  Concerns regarding medicines are outlined above.  No orders of the defined types were placed in this encounter.  No orders of the defined types were placed in this encounter.   There are no Patient Instructions  on file for this visit.   Signed, Levi Aland, NP  11/17/2022 6:24 AM    [place] [facility]

## 2022-11-18 ENCOUNTER — Ambulatory Visit: Payer: Medicare PPO | Admitting: Nurse Practitioner

## 2022-11-27 ENCOUNTER — Telehealth: Payer: Self-pay | Admitting: Neurology

## 2022-11-27 ENCOUNTER — Other Ambulatory Visit: Payer: Self-pay | Admitting: Neurology

## 2022-11-27 NOTE — Telephone Encounter (Signed)
Patient husband left a voicemail on my phone stating that she is out of a medication and needs to get it refilled asap, because his pharmacy is not opening on the weekends.  He stated this is urgent.

## 2022-11-27 NOTE — Telephone Encounter (Signed)
Called husband back at [phone]. He states he was trying to get prescription filled for his wife. Only have 3 days left. I asked what med he was referencing. He states it was for the donepezil. Advised rx sent in this am to [facility] for them. He verbalized understanding. Nothing further needed.

## 2022-12-06 DIAGNOSIS — I10 Essential (primary) hypertension: Secondary | ICD-10-CM | POA: Diagnosis not present

## 2022-12-06 DIAGNOSIS — E782 Mixed hyperlipidemia: Secondary | ICD-10-CM | POA: Diagnosis not present

## 2022-12-06 DIAGNOSIS — E1169 Type 2 diabetes mellitus with other specified complication: Secondary | ICD-10-CM | POA: Diagnosis not present

## 2022-12-09 NOTE — Progress Notes (Signed)
Cardiology Office Note:    Date:  12/12/2022   ID:  Kristine [name], DOB 1941-12-01, MRN [id number]  PCP:  Renaye Rakers, MD   [facility] Providers Cardiologist:  Verne Carrow, MD     Referring MD: Renaye Rakers, MD   Chief Complaint: follow-up CAD  History of Present Illness:    Kristine [name] is a pleasantly demented 82 y.o. female with a hx of CAD, hypertension, hyperlipidemia, COPD, TIA, PAD, and dementia.  She had an inferior STEMI in 2011 treated with 3 drug-eluting stents in the proximal, mid, and distal RCA.  She is known to have anomalous takeoff of all coronaries.  Nuclear stress test October 2014 with no ischemia.  Has PAD with abnormal LE arterial Doppler since May 2011.  Seen in 2011 by Dr. Hart Rochester but has had serial LE arterial testing in our office since.  Seen in [facility] clinic by Dr. Allyson Sabal December 2018 and he felt toe pain was not vascular in nature.  He planned to follow-up as needed in the [facility] clinic.  ABI December 2022 unchanged.  Seen in the ED June 2022 with chest pain, troponin negative.  Echo June 2022 with LVEF 60 to 65%, no significant valve disease.  Her chest pain was not felt to be cardiac related.  Protonix was added but she did not take it.  Last cardiology clinic visit was 12/27/2021 with Dr. Clifton James at which time she had no specific concerns and no changes were made to medical regimen.  She was advised to follow-up in 1 year.  Today, she is here with her husband and daughter for annual follow-up. She ambulates with a rolling walker. Is able to answer questions but does not give specific details of her past or current medical history. She had an appointment with Dr. Edilia Bo, [facility] 12/11/22. Per his note, medical management advised for bilateral infrainguinal arterial occlusive disease given her age and co-morbidities. She reported claudication and leg swelling to him per his note. She denies symptoms to me with the exception of feeling an occasional flip flop  like her heart is turning over. Sometimes feels some chest tightness which has not been concerning enough for her to tell her husband. She denies n/v, diaphoresis, SOB associated with tightness. She denies orthopnea, PND, edema, presyncope, syncope. [name] was upset about having to pay co-pay to come in today. We discussed that yearly follow-up is needed if we continue to prescribe medications. Offered that they could have PCP feel cardiac medications and notify us with concerns, however he and daughter verbalized that they would like patient to continue to come in yearly. She denies chest pain, shortness of breath, lower extremity edema, fatigue, palpitations, melena, hematuria, hemoptysis, diaphoresis, weakness, presyncope, syncope, orthopnea, and PND.   Past Medical History:  Diagnosis Date   COPD (chronic obstructive pulmonary disease) (HCC)    Hard of hearing    History of anemia    History of blood transfusion    History of pneumonia    HTN (hypertension)    Hyperlipidemia    Hyperthyroidism    treated wtih radioactive iodine   OA (osteoarthritis of spine)    PVD (peripheral vascular disease) (HCC)    Lower extremity Dopplers  May 2revealed an ABI of 0.83 in the right posterior    Shortness of breath dyspnea    STEMI (ST elevation myocardial infarction) [facility]) May 2011   Promus DES in the proximal, mid and distal RCA in May 2011   Stroke (  [place])    Urinary frequency    Urinary urgency     Past Surgical History:  Procedure Laterality Date   ABDOMINAL HYSTERECTOMY     Had ovarian resection and required lysis of adhesions   CARDIAC CATHETERIZATION  04/07/2010   EF 60-70%   CORONARY STENTS     x3   ESOPHAGOGASTRODUODENOSCOPY N/A 03/10/2015   Procedure: ESOPHAGOGASTRODUODENOSCOPY (EGD);  Surgeon: Acquanetta Sit, MD;  Location: [place];  Service: Endoscopy;  Laterality: N/A;   OVARY SURGERY     TOTAL HIP ARTHROPLASTY  2004   left   TOTAL HIP ARTHROPLASTY Right 01/23/2016    Procedure: RIGHT TOTAL HIP ARTHROPLASTY ANTERIOR APPROACH;  Surgeon: Ninetta Lights, MD;  Location: [place];  Service: Orthopedics;  Laterality: Right;    Current Medications: Current Meds  Medication Sig   aspirin EC 81 MG tablet Take 81 mg by mouth daily.   atorvastatin (LIPITOR) 80 MG tablet Take 1 tablet (80 mg total) by mouth daily. Please keep upcoming appt.with provider to receive future refills. Thank You.   BREZTRI AEROSPHERE 160-9-4.8 MCG/ACT AERO Inhale 2 puffs into the lungs 2 (two) times daily.   chlorhexidine (PERIDEX) 0.12 % solution as directed.   clonazePAM (KLONOPIN) 1 MG tablet 1 pill po qHS   cyclobenzaprine (FLEXERIL) 5 MG tablet Take 1-2 tablets (5-10 mg total) by mouth at bedtime as needed for muscle spasms.   diclofenac Sodium (VOLTAREN) 1 % GEL Apply topically.   donepezil (ARICEPT) 5 MG tablet TAKE 1 TABLET(5 MG) BY MOUTH AT BEDTIME   DULoxetine (CYMBALTA) 60 MG capsule Take 1 capsule (60 mg total) by mouth daily.   ezetimibe (ZETIA) 10 MG tablet TAKE 1 TABLET(10 MG) BY MOUTH DAILY   hydrALAZINE (APRESOLINE) 25 MG tablet TAKE 1 TABLET(25 MG) BY MOUTH DAILY   hydrochlorothiazide (HYDRODIURIL) 25 MG tablet Take 1 tablet (25 mg total) by mouth daily.   levothyroxine (SYNTHROID) 50 MCG tablet Take 50 mcg by mouth every morning.   melatonin 5 MG TABS Take 5 mg by mouth at bedtime.   methimazole (TAPAZOLE) 5 MG tablet Take 5 mg by mouth daily.   metoprolol succinate (TOPROL-XL) 50 MG 24 hr tablet TAKE 1 TABLET(50 MG) BY MOUTH DAILY   mirtazapine (REMERON) 15 MG tablet 1/2 to 1 pill po qHS   nitroGLYCERIN (NITROSTAT) 0.3 MG SL tablet Place 1 tablet (0.3 mg total) under the tongue every 5 (five) minutes as needed for chest pain.   potassium chloride (KLOR-CON) 10 MEQ tablet Take 10 mEq by mouth 2 (two) times daily.   Vitamin D, Ergocalciferol, (DRISDOL) 1.25 MG (50000 UNIT) CAPS capsule Take 50,000 Units by mouth every 7 (seven) days.     Allergies:   Ace inhibitors,  Iodinated contrast media, Other, Shellfish-derived products, and Hydrocodone   Social History   Socioeconomic History   Marital status: Married    Spouse name: Designer, industrial/product   Number of children: 3   Years of education: Masters   Highest education level: Not on file  Occupational History   Occupation: Retired  Tobacco Use   Smoking status: Former    Packs/day: 1.00    Years: 45.00    Total pack years: 45.00    Types: Cigarettes    Quit date: 04/07/2010    Years since quitting: 12.6   Smokeless tobacco: Never  Vaping Use   Vaping Use: Never used  Substance and Sexual Activity   Alcohol use: Yes    Comment: rarely    Drug use:  No   Sexual activity: Not on file  Other Topics Concern   Not on file  Social History Narrative   Lives with husband, daughter and her son   Right handed   Tea sometimes, decaf coffee   Social Determinants of Health   Financial Resource Strain: Not on file  Food Insecurity: Not on file  Transportation Needs: Not on file  Physical Activity: Not on file  Stress: Not on file  Social Connections: Not on file     Family History: The patient's family history includes Congestive Heart Failure in her mother; Heart attack in her father; Peripheral vascular disease in her mother.  ROS:   Please see the history of present illness.   + occasional palpitations + occasional chest tightness All other systems reviewed and are negative.  Labs/Other Studies Reviewed:    The following studies were reviewed today:   VAS ABI 12/04/21 Right ABIs appear increased compared to prior study on 12/04/20. Left ABIs  appear essentially unchanged compared to prior study on 12/04/20.    Summary:  Right: Resting right ankle-brachial index indicates mild right lower  extremity arterial disease. The right toe-brachial index is abnormal.   Left: Resting left ankle-brachial index indicates mild left lower  extremity arterial disease. The left toe-brachial index is abnormal.      *See table(s) above for measurements and observations.     Echo 05/15/21 1. Left ventricular ejection fraction, by estimation, is 60 to 65%. The  left ventricle has normal function. The left ventricle has no regional  wall motion abnormalities. Left ventricular diastolic parameters are  consistent with Grade I diastolic  dysfunction (impaired relaxation).   2. Right ventricular systolic function is normal. The right ventricular  size is normal. There is normal pulmonary artery systolic pressure. The  estimated right ventricular systolic pressure is 23.6 mmHg.   3. The mitral valve is normal in structure. No evidence of mitral valve  regurgitation. No evidence of mitral stenosis.   4. The aortic valve is calcified. Aortic valve regurgitation is not  visualized. Mild to moderate aortic valve sclerosis/calcification is  present, without any evidence of aortic stenosis.   5. The inferior vena cava is normal in size with greater than 50%  respiratory variability, suggesting right atrial pressure of 3 mmHg.    Myocardial Perfusion Imaging 10/06/2013 No ischemia  Recent Labs: 10/11/2022: ALT 43; B Natriuretic Peptide 133.1; BUN 15; Creatinine, Ser 0.77; Hemoglobin 11.2; Platelets 300; Potassium 3.4; Sodium 141  Recent Lipid Panel    Component Value Date/Time   CHOL 155 08/31/2019 0000   TRIG 51 08/31/2019 0000   HDL 59 08/31/2019 0000   CHOLHDL 2.6 08/31/2019 0000   CHOLHDL 3 07/31/2014 1153   VLDL 20.6 07/31/2014 1153   LDLCALC 85 08/31/2019 0000   LDLDIRECT 138.9 07/23/2011 0815     Risk Assessment/Calculations:      Physical Exam:    VS:  BP 120/84   Pulse 69   Ht 5\' 3"  (1.6 m)   Wt 113 lb 9.6 oz (51.5 kg)   SpO2 99%   BMI 20.12 kg/m     Wt Readings from Last 3 Encounters:  12/12/22 113 lb 9.6 oz (51.5 kg)  12/11/22 102 lb (46.3 kg)  10/11/22 102 lb (46.3 kg)     GEN: ` Frail, chronically ill appearing in no acute distress HEENT: Normal NECK: No JVD; No  carotid bruits CARDIAC: RRR, no murmurs, rubs, gallops RESPIRATORY:  Clear to auscultation without rales, wheezing  or rhonchi  ABDOMEN: Soft, non-tender, non-distended MUSCULOSKELETAL:  No edema; No deformity. 2+ pedal pulses, equal bilaterally SKIN: Warm and dry NEUROLOGIC:  Alert, verbal. Confusion regarding identification of family members PSYCHIATRIC:  Pleasant  EKG:  EKG is ordered today.  The ekg ordered today demonstrates normal sinus rhythm at 69 bpm, RSR pattern in V1 suggestive of right ventricular conduction delay, no acute change from previous tracing  Diagnoses:    1. Essential hypertension   2. Coronary artery disease involving native coronary artery of native heart without angina pectoris   3. PVD (peripheral vascular disease) (HCC)   4. Hyperlipidemia LDL goal <70    Assessment and Plan:     CAD without angina: History of STEMI 2011 treated with DES x 3 to prox, mid and distal RCA, no ischemia on NST 2014. She describes occasional chest tightness and palpitations, no associated SOB,  n/v, diaphoresis.  She denies dyspnea. Symptoms not concerning for unstable angina. No indication for further ischemic evaluation at this time. She would likely not be a candidate for intervention 2/2 frailty, comorbid conditions.  PVD: Stable infrainguinal arterial occlusive disease bilaterally with mild claudication per report 12/11/22. Management per [facility].   Hyperlipidemia LDL goal < 70: LDL 83 on 05/16/22.  Discussion of the importance of keeping LDL< 70 in the setting of CAD and PAD. We will recheck lipid and liver panels today. Continue ezetimibe, Lipitor.   Hypertension: BP has been well controlled.      Disposition: 1 year with Dr. Clifton James  Medication Adjustments/Labs and Tests Ordered: Current medicines are reviewed at length with the patient today.  Concerns regarding medicines are outlined above.  Orders Placed This Encounter  Procedures   Lipid Profile   Comp Met (CMET)   EKG  12-Lead   No orders of the defined types were placed in this encounter.   Patient Instructions  Medication Instructions:   Your physician recommends that you continue on your current medications as directed. Please refer to the Current Medication list given to you today.   *If you need a refill on your cardiac medications before your next appointment, please call your pharmacy*   Lab Work:  TODAY!!!! LIPID/CMET  If you have labs (blood work) drawn today and your tests are completely normal, you will receive your results only by: MyChart Message (if you have MyChart) OR A paper copy in the mail If you have any lab test that is abnormal or we need to change your treatment, we will call you to review the results.   Testing/Procedures:  None ordered.   Follow-Up: At [facility], you and your health needs are our priority.  As part of our continuing mission to provide you with exceptional heart care, we have created designated Provider Care Teams.  These Care Teams include your primary Cardiologist (physician) and Advanced Practice Providers (APPs -  Physician Assistants and Nurse Practitioners) who all work together to provide you with the care you need, when you need it.  We recommend signing up for the patient portal called "MyChart".  Sign up information is provided on this After Visit Summary.  MyChart is used to connect with patients for Virtual Visits (Telemedicine).  Patients are able to view lab/test results, encounter notes, upcoming appointments, etc.  Non-urgent messages can be sent to your provider as well.   To learn more about what you can do with MyChart, go to [email or web address].    Your next appointment:   1 year(s)  The format  for your next appointment:   In Person  Provider:   Lauree Chandler, MD     Other Instructions  Your physician wants you to follow-up in: 1 year with Dr. Angelena Form.  You will receive a reminder letter in the mail  two months in advance. If you don't receive a letter, please call our office to schedule the follow-up appointment.   Important Information About Sugar         Signed, Emmaline Life, NP  12/12/2022 3:59 PM    [place]

## 2022-12-11 ENCOUNTER — Encounter: Payer: Self-pay | Admitting: Vascular Surgery

## 2022-12-11 ENCOUNTER — Ambulatory Visit
Admission: RE | Admit: 2022-12-11 | Discharge: 2022-12-11 | Disposition: A | Discharge status: Home / Self Care | Payer: Medicare PPO | Source: Ambulatory Visit | Attending: Vascular Surgery | Admitting: Vascular Surgery

## 2022-12-11 ENCOUNTER — Ambulatory Visit: Payer: Medicare PPO | Admitting: Vascular Surgery

## 2022-12-11 ENCOUNTER — Other Ambulatory Visit: Payer: Self-pay | Admitting: *Deleted

## 2022-12-11 VITALS — BP 127/79 | HR 63 | Temp 98.3°F | Resp 20 | Ht 63.0 in | Wt 102.0 lb

## 2022-12-11 DIAGNOSIS — E785 Hyperlipidemia, unspecified: Secondary | ICD-10-CM | POA: Diagnosis not present

## 2022-12-11 DIAGNOSIS — I739 Peripheral vascular disease, unspecified: Secondary | ICD-10-CM | POA: Insufficient documentation

## 2022-12-11 DIAGNOSIS — I2510 Atherosclerotic heart disease of native coronary artery without angina pectoris: Secondary | ICD-10-CM | POA: Diagnosis not present

## 2022-12-11 DIAGNOSIS — I10 Essential (primary) hypertension: Secondary | ICD-10-CM | POA: Insufficient documentation

## 2022-12-11 DIAGNOSIS — I70219 Atherosclerosis of native arteries of extremities with intermittent claudication, unspecified extremity: Secondary | ICD-10-CM | POA: Diagnosis not present

## 2022-12-11 NOTE — Progress Notes (Signed)
ASSESSMENT & PLAN   PERIPHERAL ARTERIAL DISEASE: This patient has evidence of infrainguinal arterial occlusive disease bilaterally.  She has mild claudication.  She denies any rest pain or nonhealing ulcers.  Given her age and multiple medical comorbidities including coronary artery disease, COPD, and dementia, I would not recommend an aggressive workup unless she developed critical limb ischemia.  I have encouraged her to stay as active as possible.  We discussed the importance of nutrition.  Fortunately she is not a smoker.  LEG SWELLING: We have discussed the importance of elevating her legs during the day to help with her leg swelling.  She does not have evidence of significant venous insufficiency on exam.  Certainly if this is progresses we could get formal venous reflux testing.  REASON FOR CONSULT:    Lower extremity pain and swelling.  The consult is requested by Derenda Mis.   HPI:   Kristine [name] is a 82 y.o. female who is referred with pain and swelling in her feet and ankles.  I have reviewed the records from the referring office.  The patient was seen on 10/15/2022.  She was complaining of lower extremity swelling.  There was some pain to the left ankle to palpation.  She was set up for vascular surgery evaluation.  On my history the patient complains of pain in the left leg mostly in the left knee.  She states that the pain in her leg feels better when she elevates it at times.  This would suggest that this is not arterial insufficiency.  She does describe some discomfort in her lower legs which is brought on by ambulation and relieved with rest.  The symptoms are fairly mild.  I do not get any history of rest pain or nonhealing ulcers.  She does have a callus on the plantar aspect of her left foot which is followed by podiatry.  Past Medical History:  Diagnosis Date   COPD (chronic obstructive pulmonary disease) (HCC)    Hard of hearing    History of anemia     History of blood transfusion    History of pneumonia    HTN (hypertension)    Hyperlipidemia    Hyperthyroidism    treated wtih radioactive iodine   OA (osteoarthritis of spine)    PVD (peripheral vascular disease) (HCC)    Lower extremity Dopplers  May 2revealed an ABI of 0.83 in the right posterior    Shortness of breath dyspnea    STEMI (ST elevation myocardial infarction) [facility]) May 2011   Promus DES in the proximal, mid and distal RCA in May 2011   Stroke [facility])    Urinary frequency    Urinary urgency     Family History  Problem Relation Age of Onset   Peripheral vascular disease Mother    Congestive Heart Failure Mother    Heart attack Father     SOCIAL HISTORY: Social History   Tobacco Use   Smoking status: Former    Packs/day: 1.00    Years: 45.00    Total pack years: 45.00    Types: Cigarettes    Quit date: 04/07/2010    Years since quitting: 12.6   Smokeless tobacco: Never  Substance Use Topics   Alcohol use: Yes    Comment: rarely     Allergies  Allergen Reactions   Ace Inhibitors Swelling    Angioedema of the tongue   Iodinated Contrast Media Anaphylaxis   Other Anaphylaxis    Allergic to  melons, nuts   Shellfish-Derived Products Anaphylaxis   Hydrocodone Other (See Comments)    unspecified    Current Outpatient Medications  Medication Sig Dispense Refill   aspirin EC 81 MG tablet Take 81 mg by mouth daily.     atorvastatin (LIPITOR) 80 MG tablet Take 1 tablet (80 mg total) by mouth daily. Please keep upcoming appt.with provider to receive future refills. Thank You. 90 tablet 0   BREZTRI AEROSPHERE 160-9-4.8 MCG/ACT AERO Inhale 2 puffs into the lungs 2 (two) times daily.     chlorhexidine (PERIDEX) 0.12 % solution as directed.     clonazePAM (KLONOPIN) 1 MG tablet 1 pill po qHS 30 tablet 5   cyclobenzaprine (FLEXERIL) 5 MG tablet Take 1-2 tablets (5-10 mg total) by mouth at bedtime as needed for muscle spasms. 60 tablet 2   diclofenac Sodium  (VOLTAREN) 1 % GEL Apply topically.     donepezil (ARICEPT) 5 MG tablet TAKE 1 TABLET(5 MG) BY MOUTH AT BEDTIME 90 tablet 1   DULoxetine (CYMBALTA) 60 MG capsule Take 1 capsule (60 mg total) by mouth daily. 90 capsule 3   ezetimibe (ZETIA) 10 MG tablet TAKE 1 TABLET(10 MG) BY MOUTH DAILY 90 tablet 2   hydrALAZINE (APRESOLINE) 25 MG tablet TAKE 1 TABLET(25 MG) BY MOUTH DAILY 90 tablet 1   hydrochlorothiazide (HYDRODIURIL) 25 MG tablet Take 1 tablet (25 mg total) by mouth daily. 90 tablet 1   levothyroxine (SYNTHROID) 50 MCG tablet Take 50 mcg by mouth every morning.     melatonin 5 MG TABS Take 5 mg by mouth at bedtime.     methimazole (TAPAZOLE) 5 MG tablet Take 5 mg by mouth daily.     metoprolol succinate (TOPROL-XL) 50 MG 24 hr tablet TAKE 1 TABLET(50 MG) BY MOUTH DAILY 90 tablet 2   mirtazapine (REMERON) 15 MG tablet 1/2 to 1 pill po qHS 30 tablet 5   nitroGLYCERIN (NITROSTAT) 0.3 MG SL tablet Place 1 tablet (0.3 mg total) under the tongue every 5 (five) minutes as needed for chest pain. 25 tablet 3   potassium chloride (KLOR-CON) 10 MEQ tablet Take 10 mEq by mouth 2 (two) times daily.     Vitamin D, Ergocalciferol, (DRISDOL) 1.25 MG (50000 UNIT) CAPS capsule Take 50,000 Units by mouth every 7 (seven) days.     No current facility-administered medications for this visit.    REVIEW OF SYSTEMS:  [X]  denotes positive finding, [ ]  denotes negative finding Cardiac  Comments:  Chest pain or chest pressure:    Shortness of breath upon exertion:    Short of breath when lying flat:    Irregular heart rhythm:        Vascular    Pain in calf, thigh, or hip brought on by ambulation:    Pain in feet at night that wakes you up from your sleep:     Blood clot in your veins:    Leg swelling:         Pulmonary    Oxygen at home:    Productive cough:     Wheezing:         Neurologic    Sudden weakness in arms or legs:     Sudden numbness in arms or legs:     Sudden onset of difficulty  speaking or slurred speech:    Temporary loss of vision in one eye:     Problems with dizziness:         Gastrointestinal  Blood in stool:     Vomited blood:         Genitourinary    Burning when urinating:     Blood in urine:        Psychiatric    Major depression:         Hematologic    Bleeding problems:    Problems with blood clotting too easily:        Skin    Rashes or ulcers:        Constitutional    Fever or chills:    -  PHYSICAL EXAM:   Vitals:   12/11/22 1459  BP: 127/79  Pulse: 63  Resp: 20  Temp: 98.3 F (36.8 C)  SpO2: 95%  Weight: 102 lb (46.3 kg)  Height: 5\' 3"  (1.6 m)   Body mass index is 18.07 kg/m. GENERAL: The patient is a well-nourished female, in no acute distress. The vital signs are documented above. CARDIAC: There is a regular rate and rhythm.  VASCULAR: I do not detect carotid bruits. She has palpable femoral pulses bilaterally. I cannot palpate popliteal or pedal pulses. She has no significant lower extremity swelling. PULMONARY: There is good air exchange bilaterally without wheezing or rales. ABDOMEN: Soft and non-tender with normal pitched bowel sounds.  I do not palpate an aneurysm. MUSCULOSKELETAL: There are no major deformities. NEUROLOGIC: No focal weakness or paresthesias are detected. SKIN: There are no ulcers or rashes noted. PSYCHIATRIC: The patient has a normal affect.  DATA:    ARTERIAL DOPPLER STUDY: I have independently interpreted her arterial Doppler study today.  On the right side there is a monophasic dorsalis pedis and posterior tibial signal.  ABIs 52%.  Toe pressures 42 mmHg.  On the left side there is a monophasic dorsalis pedis and posterior tibial signal.  ABI is 68%.  Toe pressure is 75 mmHg.  LABS: I reviewed the labs from 10/11/2022.  At that time GFR was greater than 60.  Deitra [facility]

## 2022-12-12 ENCOUNTER — Encounter: Payer: Self-pay | Admitting: Nurse Practitioner

## 2022-12-12 ENCOUNTER — Ambulatory Visit: Payer: Medicare PPO | Admitting: Nurse Practitioner

## 2022-12-12 VITALS — BP 120/84 | HR 69 | Ht 63.0 in | Wt 113.6 lb

## 2022-12-12 DIAGNOSIS — E785 Hyperlipidemia, unspecified: Secondary | ICD-10-CM

## 2022-12-12 DIAGNOSIS — I10 Essential (primary) hypertension: Secondary | ICD-10-CM | POA: Diagnosis not present

## 2022-12-12 DIAGNOSIS — I2510 Atherosclerotic heart disease of native coronary artery without angina pectoris: Secondary | ICD-10-CM | POA: Diagnosis not present

## 2022-12-12 DIAGNOSIS — I739 Peripheral vascular disease, unspecified: Secondary | ICD-10-CM | POA: Diagnosis not present

## 2022-12-12 NOTE — Patient Instructions (Signed)
Medication Instructions:   Your physician recommends that you continue on your current medications as directed. Please refer to the Current Medication list given to you today.   *If you need a refill on your cardiac medications before your next appointment, please call your pharmacy*   Lab Work:  TODAY!!!! LIPID/CMET  If you have labs (blood work) drawn today and your tests are completely normal, you will receive your results only by: [place] (if you have MyChart) OR A paper copy in the mail If you have any lab test that is abnormal or we need to change your treatment, we will call you to review the results.   Testing/Procedures:  None ordered.   Follow-Up: At [facility], you and your health needs are our priority.  As part of our continuing mission to provide you with exceptional heart care, we have created designated Provider Care Teams.  These Care Teams include your primary Cardiologist (physician) and Advanced Practice Providers (APPs -  Physician Assistants and Nurse Practitioners) who all work together to provide you with the care you need, when you need it.  We recommend signing up for the patient portal called "MyChart".  Sign up information is provided on this After Visit Summary.  MyChart is used to connect with patients for Virtual Visits (Telemedicine).  Patients are able to view lab/test results, encounter notes, upcoming appointments, etc.  Non-urgent messages can be sent to your provider as well.   To learn more about what you can do with MyChart, go to [email or web address].    Your next appointment:   1 year(s)  The format for your next appointment:   In Person  Provider:   Lauree Chandler, MD     Other Instructions  Your physician wants you to follow-up in: 1 year with Dr. Angelena Form.  You will receive a reminder letter in the mail two months in advance. If you don't receive a letter, please call our office to schedule the follow-up  appointment.   Important Information About Sugar

## 2022-12-13 ENCOUNTER — Other Ambulatory Visit: Payer: Self-pay | Admitting: Nurse Practitioner

## 2022-12-13 DIAGNOSIS — E782 Mixed hyperlipidemia: Secondary | ICD-10-CM

## 2022-12-13 DIAGNOSIS — I25118 Atherosclerotic heart disease of native coronary artery with other forms of angina pectoris: Secondary | ICD-10-CM

## 2022-12-13 LAB — COMPREHENSIVE METABOLIC PANEL
ALT: 18 IU/L (ref 0–32)
AST: 32 IU/L (ref 0–40)
Albumin/Globulin Ratio: 1.4 (ref 1.2–2.2)
Albumin: 4.2 g/dL (ref 3.8–4.8)
Alkaline Phosphatase: 99 IU/L (ref 44–121)
BUN/Creatinine Ratio: 16 (ref 12–28)
BUN: 14 mg/dL (ref 8–27)
Bilirubin Total: 0.5 mg/dL (ref 0.0–1.2)
CO2: 27 mmol/L (ref 20–29)
Calcium: 9.8 mg/dL (ref 8.7–10.3)
Chloride: 103 mmol/L (ref 96–106)
Creatinine, Ser: 0.88 mg/dL (ref 0.57–1.00)
Globulin, Total: 3 g/dL (ref 1.5–4.5)
Glucose: 91 mg/dL (ref 70–99)
Potassium: 4.2 mmol/L (ref 3.5–5.2)
Sodium: 144 mmol/L (ref 134–144)
Total Protein: 7.2 g/dL (ref 6.0–8.5)
eGFR: 67 mL/min/{1.73_m2} (ref >59)

## 2022-12-13 LAB — LIPID PANEL
Chol/HDL Ratio: 3 ratio (ref 0.0–4.4)
Cholesterol, Total: 134 mg/dL (ref 100–199)
HDL: 45 mg/dL (ref >39)
LDL Chol Calc (NIH): 71 mg/dL (ref 0–99)
Triglycerides: 97 mg/dL (ref 0–149)
VLDL Cholesterol Cal: 18 mg/dL (ref 5–40)

## 2022-12-13 MED ORDER — ATORVASTATIN CALCIUM 80 MG PO TABS: 80.0000 mg | ORAL_TABLET | Freq: Every day | ORAL | 3 refills | Status: DC

## 2022-12-17 ENCOUNTER — Telehealth: Payer: Self-pay | Admitting: Neurology

## 2022-12-17 ENCOUNTER — Encounter: Payer: Medicare PPO | Attending: Physical Medicine and Rehabilitation | Admitting: Physical Medicine and Rehabilitation

## 2022-12-17 ENCOUNTER — Encounter: Payer: Self-pay | Admitting: Physical Medicine and Rehabilitation

## 2022-12-17 VITALS — BP 137/85 | HR 60 | Ht 63.0 in | Wt 113.0 lb

## 2022-12-17 DIAGNOSIS — F0280 Dementia in other diseases classified elsewhere without behavioral disturbance: Secondary | ICD-10-CM | POA: Diagnosis not present

## 2022-12-17 DIAGNOSIS — Z7409 Other reduced mobility: Secondary | ICD-10-CM | POA: Diagnosis not present

## 2022-12-17 DIAGNOSIS — G3183 Dementia with Lewy bodies: Secondary | ICD-10-CM | POA: Diagnosis not present

## 2022-12-17 DIAGNOSIS — M5416 Radiculopathy, lumbar region: Secondary | ICD-10-CM | POA: Insufficient documentation

## 2022-12-17 DIAGNOSIS — R269 Unspecified abnormalities of gait and mobility: Secondary | ICD-10-CM | POA: Diagnosis not present

## 2022-12-17 DIAGNOSIS — R296 Repeated falls: Secondary | ICD-10-CM | POA: Diagnosis not present

## 2022-12-17 MED ORDER — RISPERIDONE 0.25 MG PO TABS: 0.2500 mg | ORAL_TABLET | Freq: Two times a day (BID) | ORAL | 5 refills | Status: AC | PRN

## 2022-12-17 NOTE — Patient Instructions (Signed)
Pt is a 82 yr old female with hx of L THR in 2017; asthma, COPD; CAD s/p stents; hx of stroke- no weakness; HTN, and HLD; chronic low back pain. 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  Here for f/u on chronic back pain impairment.     Having tactile hallucinations- in mouth and in foot.   2. Will try Outpt PT for Impaired balance, gait, and frequent falls.    3. Is taking Klonopin 1 mg QHS for sleep- talks and kicks in sleep- sometimes better-   4. Risperidone 0.25 mg 2x/day as needed- can take during day AND night or 2 pills at night- if gets more confused/hallucinating.     5. Educated that Risperidone has increased risk of death- for unknown reason but I don't see another option right now.    6. Con't Duloxetine- 60 mg daily- doesn't need refill today- pain usually controlled   7. F/U in 3 months-

## 2022-12-17 NOTE — Telephone Encounter (Addendum)
I had an extended phone call this afternoon with patient's daughter, Bethena Roys.  She stated that after 2 consecutive dental cleaning appointments, the patient has been spitting all day into cups, paper towels, paper plates, etc.  She states pt thinks that she is spitting food out of her mouth and she is names foods like corn, etc but they are not things that she has eaten within the last week.  Her daughter states there is no excessive saliva but the patient is forming it with her mouth and moving it up with her tongue.  The daughter questioned if there was something that could help like Robinul, but she understands we would not want to interfere with pt's normal secretions.  The patient is on Klonopin 1 mg nightly, Aricept 5 mg nightly, mirtazapine 15 mg (1/2-1 tablet QHS).  After Dr. Felecia Shelling decreased the duloxetine to 30 mg QHS on 08/25/22, pain management increased back to 60 mg for pain control but did note this was decreased by Dr Felecia Shelling due to RBD.  The patient has also been waking up at approximately 1 AM ready to have her coffee and start her day. 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  She may potentially fall back asleep but the closer to 5 AM that she wakes up the more likely she is to stay up.  She has always been an early riser without an alarm and used to work as a Pharmacist, hospital.  She hallucinates and sees children in her driveway and inside the house. She tries to help them. She also has tactile hallucinations with her spitting and also feels a callus on her foot is irritated by a pine needle. It is draining on pt's husband and both of their sleep cycles are consistently disrupted.  Overall daughter sates these are things have been present but are more frequent now. Patient's daughter states that she is back to show following and has a certain level of uncertainty about her movement.  The patient saw pain management today.  They are going to try to help her get outpatient physical therapy and duloxetine  was continued at 60 mg.  Risperdal 0.25 mg twice daily as needed was ordered today as well to try to help the patient. Pain management note is available for review in chart. Daughter also states that pt/husband report good pain management with PRN use of Voltaren cream so daughter is wondering if she needs that high of Duloxetine if her pain is able to be helped with Voltaren.

## 2022-12-17 NOTE — Telephone Encounter (Signed)
Pt daughter is calling. Stated pt is waking up from 2am- 5am and she is not sleeping at all at night. Stated pt is seeing lil kids in the driveway. Stated she would like Dr. Felecia Shelling to adjust medication to help pt sleep at night so her dad can get some rest. Pt went to dentist several weeks ago and got a deep cleaning and pt is spitting in cups stating food is in her mouth and haven't even eating anything. She is requesting a call back from nurse.

## 2022-12-17 NOTE — Progress Notes (Signed)
Subjective:    Patient ID: Kristine [name], female    DOB: [birth year], 82 y.o.   MRN: [id number]  HPI  Pt is a 82 yr old female with hx of L THR in 2017; asthma, COPD; CAD s/p stents; hx of stroke- no weakness; HTN, and HLD; chronic low back pain. 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     Has a callus /thinks has thorn in L foot.   Gets up at different times all the time Wants to go see children- to get them bathed and fed. Hallucinating and delusions.   Taking 30 mg Duloxetine daily.   Different times- 10:30 pm to 2am- will hallucinate- sometimes can sleep through the night- always be early riser.  Also saying her father's name not husband's name.  Mentioning old friends- and people that are deceased.  One her principals that she had- that haven't been able to get in touch with her-  Doesn't happen every day but some days all day-  Rambles a lot in drawers and jewelry box.   Wants to fix her coffee, and food- She wants to boil water for pot- for coffee.  Something cannot talk her out of, but if his back is turned.    "Fishbones in mouth" not meds- Ever since teeth cleaned, will spit a lot.  Spits sun up to sundown and feels like things off apples in mouth that needs to get out of her mouth.   Cannot get nap in unless wife sleeping.    Eating more- so weight up to 113 lbs-was 92 lbs initially.   No anger -is easy going.   Back in back to knees- Using voltaren gel - rubs her down with it.  Sometimes 0-1x/week, mentions pain.  Most of pain is due to callus-      Pain Inventory Average Pain 8 Pain Right Now 8 My pain is sharp  In the last 24 hours, has pain interfered with the following? General activity 0 Relation with others 0 Enjoyment of life 0 What TIME of day is your pain at its worst? varies Sleep (in general) Fair  Pain is worse with: walking,  standing, and some activites Pain improves with: rest, medication, and ointment Relief from Meds: 5  Family History  Problem Relation Age of Onset   Peripheral vascular disease Mother    Congestive Heart Failure Mother    Heart attack Father    Social History   Socioeconomic History   Marital status: Married    Spouse name: Cabin crew   Number of children: 3   Years of education: Masters   Highest education level: Not on file  Occupational History   Occupation: Retired  Tobacco Use   Smoking status: Former    Packs/day: 1.00    Years: 45.00    Total pack years: 45.00    Types: Cigarettes    Quit date: 04/07/2010    Years since quitting: 12.7   Smokeless tobacco: Never  Vaping Use   Vaping Use: Never used  Substance and Sexual Activity   Alcohol use: Yes    Comment: rarely    Drug use: No   Sexual activity: Not on file  Other Topics Concern   Not on file  Social History Narrative   Lives with husband, daughter and her son   Right handed   Tea sometimes, decaf coffee   Social Determinants of Health  Financial Resource Strain: Not on file  Food Insecurity: Not on file  Transportation Needs: Not on file  Physical Activity: Not on file  Stress: Not on file  Social Connections: Not on file   Past Surgical History:  Procedure Laterality Date   ABDOMINAL HYSTERECTOMY     Had ovarian resection and required lysis of adhesions   CARDIAC CATHETERIZATION  04/07/2010   EF 60-70%   CORONARY STENTS     x3   ESOPHAGOGASTRODUODENOSCOPY N/A 03/10/2015   Procedure: ESOPHAGOGASTRODUODENOSCOPY (EGD);  Surgeon: Acquanetta Sit, MD;  Location: [place];  Service: Endoscopy;  Laterality: N/A;   OVARY SURGERY     TOTAL HIP ARTHROPLASTY  2004   left   TOTAL HIP ARTHROPLASTY Right 01/23/2016   Procedure: RIGHT TOTAL HIP ARTHROPLASTY ANTERIOR APPROACH;  Surgeon: Ninetta Lights, MD;  Location: [place];  Service: Orthopedics;  Laterality: Right;   Past Surgical History:  Procedure Laterality  Date   ABDOMINAL HYSTERECTOMY     Had ovarian resection and required lysis of adhesions   CARDIAC CATHETERIZATION  04/07/2010   EF 60-70%   CORONARY STENTS     x3   ESOPHAGOGASTRODUODENOSCOPY N/A 03/10/2015   Procedure: ESOPHAGOGASTRODUODENOSCOPY (EGD);  Surgeon: Acquanetta Sit, MD;  Location: [place];  Service: Endoscopy;  Laterality: N/A;   OVARY SURGERY     TOTAL HIP ARTHROPLASTY  2004   left   TOTAL HIP ARTHROPLASTY Right 01/23/2016   Procedure: RIGHT TOTAL HIP ARTHROPLASTY ANTERIOR APPROACH;  Surgeon: Ninetta Lights, MD;  Location: [place];  Service: Orthopedics;  Laterality: Right;   Past Medical History:  Diagnosis Date   COPD (chronic obstructive pulmonary disease) ([place])    Hard of hearing    History of anemia    History of blood transfusion    History of pneumonia    HTN (hypertension)    Hyperlipidemia    Hyperthyroidism    treated wtih radioactive iodine   OA (osteoarthritis of spine)    PVD (peripheral vascular disease) ([place])    Lower extremity Dopplers  May 2revealed an ABI of 0.83 in the right posterior    Shortness of breath dyspnea    STEMI (ST elevation myocardial infarction) [facility]) May 2011   Promus DES in the proximal, mid and distal RCA in May 2011   Stroke [facility])    Urinary frequency    Urinary urgency    Ht 5\' 3"  (1.6 m)   Wt 118 lb (53.5 kg)   BMI 20.90 kg/m   Opioid Risk Score:   Fall Risk Score:  `1  Depression screen [facility] 2/9     05/16/2022    9:35 AM 02/05/2022    9:35 AM  Depression screen PHQ 2/9  Decreased Interest 0 0  Down, Depressed, Hopeless 0 0  PHQ - 2 Score 0 0  Altered sleeping  0  Tired, decreased energy  0  Change in appetite  0  Feeling bad or failure about yourself   0  Trouble concentrating  1  Moving slowly or fidgety/restless  0  Suicidal thoughts  0  PHQ-9 Score  1      Review of Systems  Musculoskeletal:  Positive for back pain.       B/L hip pain B/L leg pain      Objective:   Physical Exam  Awake, alert,  confused; NAD Dementia noted Not participating in conversation except for to focus on callus/"thorn" in foot L foot- large callus Severely Impaired balance- cannot stand up  right without assistance- needs to hold onto husband to walk        Assessment & Plan:   Pt is a 82 yr old female with hx of L THR in 2017; asthma, COPD; CAD s/p stents; hx of stroke- no weakness; HTN, and HLD; chronic low back pain. 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  Here for f/u on chronic back pain impairment.     Having tactile hallucinations- in mouth and in foot.   2. Will try Outpt PT for Impaired balance, gait, and frequent falls.    3. Is taking Klonopin 1 mg QHS for sleep- talks and kicks in sleep- sometimes better-   4. Risperidone 0.25 mg 2x/day as needed- can take during day AND night or 2 pills at night- if gets more confused/hallucinating.     5. Educated that Risperidone has increased risk of death- for unknown reason but I don't see another option right now.    6. Con't Duloxetine- 60 mg daily- doesn't need refill today- pain usually controlled   7. F/U in 3 months-    I spent a total of   32 minutes on total care today- >50% coordination of care- due to education on risperidone and Symptoms she's having- also PT

## 2022-12-18 NOTE — Telephone Encounter (Signed)
From Dr Felecia Shelling: People with dementia sometimes spits some or have trouble swallowing certain types of foods.  Donepezil sometimes causes more saliva to be made so lets have her stop that.     I called the pt's daughter and discussed the above. She said the pt isn't have any trouble swallowing and she eats well and gains weight. She would like to discuss the Donepezil with her father and also see how pt does over the weekend on the new Risperdal, then she will call us back next week (17th) with her updates and questions for Dr Felecia Shelling. She was appreciative and her questions were answered.

## 2022-12-23 ENCOUNTER — Ambulatory Visit: Payer: Medicare PPO | Attending: Physical Medicine and Rehabilitation | Admitting: Physical Therapy

## 2022-12-23 ENCOUNTER — Telehealth: Payer: Self-pay

## 2022-12-23 DIAGNOSIS — R2681 Unsteadiness on feet: Secondary | ICD-10-CM

## 2022-12-23 DIAGNOSIS — R296 Repeated falls: Secondary | ICD-10-CM | POA: Diagnosis not present

## 2022-12-23 DIAGNOSIS — R2689 Other abnormalities of gait and mobility: Secondary | ICD-10-CM | POA: Insufficient documentation

## 2022-12-23 DIAGNOSIS — G3183 Dementia with Lewy bodies: Secondary | ICD-10-CM | POA: Diagnosis not present

## 2022-12-23 DIAGNOSIS — F0280 Dementia in other diseases classified elsewhere without behavioral disturbance: Secondary | ICD-10-CM | POA: Insufficient documentation

## 2022-12-23 DIAGNOSIS — M6281 Muscle weakness (generalized): Secondary | ICD-10-CM | POA: Insufficient documentation

## 2022-12-23 DIAGNOSIS — Z7409 Other reduced mobility: Secondary | ICD-10-CM | POA: Diagnosis not present

## 2022-12-23 DIAGNOSIS — R293 Abnormal posture: Secondary | ICD-10-CM | POA: Diagnosis not present

## 2022-12-23 NOTE — Therapy (Signed)
OUTPATIENT PHYSICAL THERAPY NEURO EVALUATION   Patient Name: Kristine [name] MRN: [id number] DOB:1940/12/30, 82 y.o., female Today's Date: 12/23/2022   PCP: Lucianne Lei, MD REFERRING PROVIDER: Courtney Heys, MD  END OF SESSION:  PT End of Session - 12/23/22 1110     Visit Number 1    Number of Visits 9   Plus eval   Date for PT Re-Evaluation 02/24/23    Authorization Type [employer]    PT Start Time 1102    PT Stop Time [id number]    PT Time Calculation (min) 43 min    Activity Tolerance Patient tolerated treatment well    Behavior During Therapy WFL for tasks assessed/performed             Past Medical History:  Diagnosis Date   COPD (chronic obstructive pulmonary disease) ([place])    Hard of hearing    History of anemia    History of blood transfusion    History of pneumonia    HTN (hypertension)    Hyperlipidemia    Hyperthyroidism    treated wtih radioactive iodine   OA (osteoarthritis of spine)    PVD (peripheral vascular disease) ([place])    Lower extremity Dopplers  May 2revealed an ABI of 0.83 in the right posterior    Shortness of breath dyspnea    STEMI (ST elevation myocardial infarction) [facility]) May 2011   Promus DES in the proximal, mid and distal RCA in May 2011   Stroke [facility])    Urinary frequency    Urinary urgency    Past Surgical History:  Procedure Laterality Date   ABDOMINAL HYSTERECTOMY     Had ovarian resection and required lysis of adhesions   CARDIAC CATHETERIZATION  04/07/2010   EF 60-70%   CORONARY STENTS     x3   ESOPHAGOGASTRODUODENOSCOPY N/A 03/10/2015   Procedure: ESOPHAGOGASTRODUODENOSCOPY (EGD);  Surgeon: Acquanetta Sit, MD;  Location: [place];  Service: Endoscopy;  Laterality: N/A;   OVARY SURGERY     TOTAL HIP ARTHROPLASTY  2004   left   TOTAL HIP ARTHROPLASTY Right 01/23/2016   Procedure: RIGHT TOTAL HIP ARTHROPLASTY ANTERIOR APPROACH;  Surgeon: Ninetta Lights, MD;  Location: [place];  Service: Orthopedics;  Laterality: Right;    Patient Active Problem List   Diagnosis Date Noted   Lewy body dementia without behavioral disturbance, psychotic disturbance, mood disturbance, or anxiety ([place]) 06/02/2022   Acute memory impairment 05/16/2022   Impaired functional mobility, balance, and endurance 05/16/2022   Chronic left-sided low back pain with left-sided sciatica 02/05/2022   Lumbar radiculopathy 02/05/2022   Chest pain 05/14/2021   Somniloquy 04/08/2021   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   Snoring 12/19/2020   Excessive daytime sleepiness 12/19/2020   Gait disturbance 12/19/2020   Pain due to onychomycosis of toenails of both feet 05/25/2019   Porokeratosis 05/25/2019   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   Angioedema 05/31/2015   Fever 03/11/2015   Acute blood loss anemia 03/11/2015   Melena 03/10/2015   GI bleed 03/09/2015   Peripheral arterial disease ([place]) 03/01/2012   CAD (coronary artery disease) 01/20/2012   HTN (hypertension)    Hyperlipidemia    PVD (peripheral vascular disease) (H[facility])    COPD (chronic obstructive pulmonary disease) ([place])    Hyperthyroidism    OA (osteoarthritis of spine)    STEMI (ST elevation myocardial infarction) ([place]) 04/07/2010    ONSET DATE: 12/17/2022  REFERRING DIAG: [id number] (ICD-10-CM) -  Lewy body dementia without behavioral disturbance, psychotic disturbance, mood disturbance, or anxiety, unspecified dementia severity (HCC) R29.6 (ICD-10-CM) - Frequent falls Z74.09 (ICD-10-CM) - Impaired functional mobility, balance, and endurance  THERAPY DIAG:  Other abnormalities of gait and mobility  Abnormal posture  Unsteadiness on feet  Rationale for Evaluation and Treatment: Rehabilitation  SUBJECTIVE:                                                                                                                                                                                             SUBJECTIVE  STATEMENT: Pt's husband provided majority of subjective. Pt's husband reports they did receive HHPT and HHOT about a year ago and things went well, but insurance denied continuation of service so they were sent here. Per husband, pt is now hunched over when she walks and her shuffling gait is worse. Had a fall in March and August of 2023, pt's family able to get her up quickly but did take her to ER to be safe - no injuries. Pt ambulating w/SPC, frequently forgets it around house.   Pt accompanied by:  Husband, Fidel Levy, Montez Hageman.   PERTINENT HISTORY: L THR in 2017; asthma, COPD; CAD s/p stents; hx of stroke- no weakness; HTN, and HLD; chronic low back pain  PAIN:  Are you having pain? Yes: NPRS scale: unable to provide/10 Pain location: BLEs and low back Pain description: Pt remembers she had pain but unable to remember where or to what extent- husband provided details   PRECAUTIONS: Fall  WEIGHT BEARING RESTRICTIONS: No  FALLS: Has patient fallen in last 6 months? Yes. Number of falls 1  LIVING ENVIRONMENT: Lives with: lives with their spouse Lives in: House/apartment Stairs: Yes: Internal: full flight (they do not use these) steps; can reach both and External: 1 steps; none Has following equipment at home: Single point cane  PLOF: Needs assistance with ADLs, Needs assistance with homemaking, Needs assistance with gait, and Needs assistance with transfers  PATIENT GOALS: "I think balance"  OBJECTIVE:   COGNITION: Overall cognitive status: Impaired and History of cognitive impairments - at baseline    COORDINATION: Pt unable to follow directions to perform coordination tasks   POSTURE: rounded shoulders and forward head   LOWER EXTREMITY MMT:  Tested in seated position   MMT Right Eval Left Eval  Hip flexion 3+ 3+  Hip extension    Hip abduction 4 4  Hip adduction 4 4  Hip internal rotation    Hip external rotation    Knee flexion 4 4  Knee extension 4- 4   Ankle dorsiflexion 4 4  Ankle  plantarflexion    Ankle inversion    Ankle eversion    (Blank rows = not tested)  BED MOBILITY:  Independent per husband - has low mattress and pt frequently gets up at night without her cane. They keep light on at night   TRANSFERS: Assistive device utilized: Single point cane  Sit to stand: CGA Stand to sit: CGA Pt relies heavily on BUE support and posterior lean on chair to perform transfer. Husband provided "nose over toes" cue and had to remind pt to use hands to push up, but pt unable to shift weight forward.  GAIT: Gait pattern: step to pattern, decreased step length- Right, decreased step length- Left, decreased stride length, decreased hip/knee flexion- Right, decreased hip/knee flexion- Left, decreased ankle dorsiflexion- Right, decreased ankle dorsiflexion- Left, shuffling, decreased trunk rotation, trunk flexed, narrow BOS, poor foot clearance- Right, and poor foot clearance- Left Distance walked: Various clinic distances  Assistive device utilized: Single point cane Level of assistance: CGA Comments: Pt ambulates very slowly and requires redirectional cues to sustain attention to task. Pt frequently picks up cane and carries it rather than use it for balance   FUNCTIONAL TESTS:   [facility] PT Assessment - 12/23/22 1239       Transfers   Five time sit to stand comments  37.07s w/BUE support and significant retropulsion      Ambulation/Gait   Gait velocity 32.8' over 45.47s = 0.19ft/s w/SPC              TODAY'S TREATMENT:          Next Session                                                                                                                        PATIENT EDUCATION: Education details: POC, eval findings  Person educated: Patient and Spouse Education method: Explanation, Demonstration, and Verbal cues Education comprehension: verbalized understanding and needs further education  HOME EXERCISE PROGRAM: To be  established   GOALS: Goals reviewed with patient? Yes  SHORT TERM GOALS: Target date: 01/20/2023  Pt will perform initial HEP with min-mod A from husband for improved strength, balance, transfers and gait.  Baseline: not established on eval Goal status: INITIAL  2.  Pt will improve gait velocity to at least 0.9 ft/s with LRAD and CGA for improved gait efficiency   Baseline: 0.72 ft/s with SPC and CGA Goal status: INITIAL  3.  Pt and husband will verbalize fall prevention techniques in the home and community for improved safety w/mobility.  Baseline:  Goal status: INITIAL  LONG TERM GOALS: Target date: 02/17/2023   Pt will perform final HEP with min-mod A from husband for improved strength, balance, transfers and gait. Baseline:  Goal status: INITIAL  2.  Pt will improve gait velocity to at least 1.2 ft/s w/LRAD and S* for improved gait efficiency   Baseline: 0.72 ft/s w/SPC and CGA Goal status: INITIAL  3.  Pt will improve 5 x STS  to less than or equal to 30 seconds w/BUE support to demonstrate improved functional strength and transfer efficiency.   Baseline: 37.07s w/BUE support and significant retropulsion  Goal status: INITIAL  ASSESSMENT:  CLINICAL IMPRESSION: Patient is a 82 year old female referred to Neuro OPPT for Lewy Body Dementia. Pt's PMH is significant for: L THR in 2017; asthma, COPD; CAD s/p stents; hx of stroke- no weakness; HTN, and HLD; chronic low back pain The following deficits were present during the exam: impaired cognition, decreased safety awareness, impaired gait kinematics, decreased strength and impaired balance. Based on 5x STS, gait speed, prognosis of pt's disease and history of falls, pt is an incr risk for falls. Pt would benefit from skilled PT to address these impairments and functional limitations to maximize functional mobility independence.   OBJECTIVE IMPAIRMENTS: Abnormal gait, decreased activity tolerance, decreased balance, decreased  cognition, decreased coordination, decreased endurance, decreased knowledge of condition, decreased knowledge of use of DME, decreased mobility, difficulty walking, decreased strength, decreased safety awareness, impaired perceived functional ability, improper body mechanics, and pain   ACTIVITY LIMITATIONS: carrying, lifting, standing, squatting, sleeping, stairs, transfers, bathing, toileting, dressing, reach over head, hygiene/grooming, locomotion level, and caring for others  PARTICIPATION LIMITATIONS: meal prep, cleaning, laundry, medication management, personal finances, interpersonal relationship, driving, shopping, community activity, and yard work  PERSONAL FACTORS: Age, Behavior pattern, Fitness, Past/current experiences, and 1 comorbidity: Lewy Body Dementia  are also affecting patient's functional outcome.   REHAB POTENTIAL: Fair due to prognosis of pt's disease  CLINICAL DECISION MAKING: Evolving/moderate complexity  EVALUATION COMPLEXITY: Moderate  PLAN:  PT FREQUENCY: 1x/week  PT DURATION: 8 weeks  PLANNED INTERVENTIONS: Therapeutic exercises, Therapeutic activity, Neuromuscular re-education, Balance training, Gait training, Patient/Family education, Self Care, Joint mobilization, Stair training, DME instructions, Manual therapy, and Re-evaluation  PLAN FOR NEXT SESSION: Trial use of rollator? HEP for sit <>stands, BLE strength and turns. Stair training (pt has step to get into house). Scifit for endurance   Cruzita Lederer [place], PT, DPT 12/23/2022, 12:32 PM

## 2022-12-23 NOTE — Telephone Encounter (Signed)
PA submitted for risperidone

## 2022-12-24 DIAGNOSIS — H9193 Unspecified hearing loss, bilateral: Secondary | ICD-10-CM | POA: Diagnosis not present

## 2022-12-24 DIAGNOSIS — I10 Essential (primary) hypertension: Secondary | ICD-10-CM | POA: Diagnosis not present

## 2022-12-24 DIAGNOSIS — I739 Peripheral vascular disease, unspecified: Secondary | ICD-10-CM | POA: Diagnosis not present

## 2022-12-24 DIAGNOSIS — R269 Unspecified abnormalities of gait and mobility: Secondary | ICD-10-CM | POA: Diagnosis not present

## 2022-12-24 DIAGNOSIS — E039 Hypothyroidism, unspecified: Secondary | ICD-10-CM | POA: Diagnosis not present

## 2022-12-24 DIAGNOSIS — I2510 Atherosclerotic heart disease of native coronary artery without angina pectoris: Secondary | ICD-10-CM | POA: Diagnosis not present

## 2022-12-24 DIAGNOSIS — Z87891 Personal history of nicotine dependence: Secondary | ICD-10-CM | POA: Diagnosis not present

## 2022-12-24 DIAGNOSIS — F411 Generalized anxiety disorder: Secondary | ICD-10-CM | POA: Diagnosis not present

## 2022-12-24 DIAGNOSIS — E785 Hyperlipidemia, unspecified: Secondary | ICD-10-CM | POA: Diagnosis not present

## 2022-12-29 ENCOUNTER — Ambulatory Visit: Payer: Medicare PPO | Admitting: Physical Therapy

## 2022-12-29 ENCOUNTER — Encounter: Payer: Self-pay | Admitting: Physical Therapy

## 2022-12-29 DIAGNOSIS — R2689 Other abnormalities of gait and mobility: Secondary | ICD-10-CM

## 2022-12-29 DIAGNOSIS — R2681 Unsteadiness on feet: Secondary | ICD-10-CM

## 2022-12-29 DIAGNOSIS — R296 Repeated falls: Secondary | ICD-10-CM | POA: Diagnosis not present

## 2022-12-29 DIAGNOSIS — G3183 Dementia with Lewy bodies: Secondary | ICD-10-CM | POA: Diagnosis not present

## 2022-12-29 DIAGNOSIS — R293 Abnormal posture: Secondary | ICD-10-CM

## 2022-12-29 DIAGNOSIS — M6281 Muscle weakness (generalized): Secondary | ICD-10-CM | POA: Diagnosis not present

## 2022-12-29 DIAGNOSIS — Z7409 Other reduced mobility: Secondary | ICD-10-CM | POA: Diagnosis not present

## 2022-12-29 DIAGNOSIS — F0280 Dementia in other diseases classified elsewhere without behavioral disturbance: Secondary | ICD-10-CM | POA: Diagnosis not present

## 2022-12-29 NOTE — Patient Instructions (Signed)
Sit to Stand Transfers:  Scoot out to the edge of the chair Place your feet flat on the floor, shoulder width apart.  Make sure your feet are tucked just under your knees. Lean forward (nose over toes) with momentum, and stand up tall with your best posture.  If you need to use your arms, use them as a quick boost up to stand. If you are in a low or soft chair, you can lean back and then forward up to stand, in order to get more momentum. Once you are standing, make sure you are looking ahead and standing tall.  To sit down:  Back up until you feel the chair behind your legs. Bend at you hips, reaching  Back for you chair, if needed, then slowly squat to sit down on your chair. 

## 2022-12-29 NOTE — Therapy (Signed)
OUTPATIENT PHYSICAL THERAPY NEURO TREATMENT   Patient Name: Kristine [name] MRN: [id number] DOB:1941-10-11, 82 y.o., female Today's Date: 12/29/2022   PCP: Renaye Rakers, MD REFERRING PROVIDER: Genice Rouge, MD  END OF SESSION:  PT End of Session - 12/29/22 1404     Visit Number 2    Number of Visits 9   Plus eval   Date for PT Re-Evaluation 02/24/23    Authorization Type [employer]    PT Start Time 1401    PT Stop Time 1444    PT Time Calculation (min) 43 min    Equipment Utilized During Treatment Gait belt    Activity Tolerance Patient tolerated treatment well    Behavior During Therapy WFL for tasks assessed/performed             Past Medical History:  Diagnosis Date   COPD (chronic obstructive pulmonary disease) (H[facility])    Hard of hearing    History of anemia    History of blood transfusion    History of pneumonia    HTN (hypertension)    Hyperlipidemia    Hyperthyroidism    treated wtih radioactive iodine   OA (osteoarthritis of spine)    PVD (peripheral vascular disease) ([facility])    Lower extremity Dopplers  May 2revealed an ABI of 0.83 in the right posterior    Shortness of breath dyspnea    STEMI (ST elevation myocardial infarction) [facility]) May 2011   Promus DES in the proximal, mid and distal RCA in May 2011   Stroke [facility])    Urinary frequency    Urinary urgency    Past Surgical History:  Procedure Laterality Date   ABDOMINAL HYSTERECTOMY     Had ovarian resection and required lysis of adhesions   CARDIAC CATHETERIZATION  04/07/2010   EF 60-70%   CORONARY STENTS     x3   ESOPHAGOGASTRODUODENOSCOPY N/A 03/10/2015   Procedure: ESOPHAGOGASTRODUODENOSCOPY (EGD);  Surgeon: Wandalee Ferdinand, MD;  Location[facility] ENDOSCOPY;  Service: Endoscopy;  Laterality: N/A;   OVARY SURGERY     TOTAL HIP ARTHROPLASTY  2004   left   TOTAL HIP ARTHROPLASTY Right 01/23/2016   Procedure: RIGHT TOTAL HIP ARTHROPLASTY ANTERIOR APPROACH;  Surgeon: Loreta Ave, MD;  Location: [facility] OR;   Service: Orthopedics;  Laterality: Right;   Patient Active Problem List   Diagnosis Date Noted   Lewy body dementia without behavioral disturbance, psychotic disturbance, mood disturbance, or anxiety (HCC) 06/02/2022   Acute memory impairment 05/16/2022   Impaired functional mobility, balance, and endurance 05/16/2022   Chronic left-sided low back pain with left-sided sciatica 02/05/2022   Lumbar radiculopathy 02/05/2022   Chest pain 05/14/2021   Somniloquy 04/08/2021   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   Snoring 12/19/2020   Excessive daytime sleepiness 12/19/2020   Gait disturbance 12/19/2020   Pain due to onychomycosis of toenails of both feet 05/25/2019   Porokeratosis 05/25/2019   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   Angioedema 05/31/2015   Fever 03/11/2015   Acute blood loss anemia 03/11/2015   Melena 03/10/2015   GI bleed 03/09/2015   Peripheral arterial disease (HCC) 03/01/2012   CAD (coronary artery disease) 01/20/2012   HTN (hypertension)    Hyperlipidemia    PVD (peripheral vascular disease) (HCC)    COPD (chronic obstructive pulmonary disease) (HCC)    Hyperthyroidism    OA (osteoarthritis of spine)    STEMI (ST elevation myocardial infarction) (HCC) 04/07/2010  ONSET DATE: 12/17/2022  REFERRING DIAG: I45.80,D98.33 (ICD-10-CM) - Lewy body dementia without behavioral disturbance, psychotic disturbance, mood disturbance, or anxiety, unspecified dementia severity (HCC) R29.6 (ICD-10-CM) - Frequent falls Z74.09 (ICD-10-CM) - Impaired functional mobility, balance, and endurance  THERAPY DIAG:  Other abnormalities of gait and mobility  Abnormal posture  Unsteadiness on feet  Rationale for Evaluation and Treatment: Rehabilitation  SUBJECTIVE:                                                                                                                                                                                              SUBJECTIVE STATEMENT: Nothing new since she was last here. No falls.   Pt accompanied by:  Husband, [name], [name].   PERTINENT HISTORY: L THR in 2017; asthma, COPD; CAD s/p stents; hx of stroke- no weakness; HTN, and HLD; chronic low back pain  PAIN:  Are you having pain? No  PRECAUTIONS: Fall  WEIGHT BEARING RESTRICTIONS: No  FALLS: Has patient fallen in last 6 months? Yes. Number of falls 1  LIVING ENVIRONMENT: Lives with: lives with their spouse Lives in: House/apartment Stairs: Yes: Internal: full flight (they do not use these) steps; can reach both and External: 1 steps; none Has following equipment at home: Single point cane  PLOF: Needs assistance with ADLs, Needs assistance with homemaking, Needs assistance with gait, and Needs assistance with transfers  PATIENT GOALS: "I think balance"  OBJECTIVE:   COGNITION: Overall cognitive status: Impaired and History of cognitive impairments - at baseline      TODAY'S TREATMENT:          TRANSFERS: Assistive device utilized: Single point cane  Sit to stand: SBA/CGA Stand to sit: SBA/CGA  Heavy focus on proper technique - scooting out towards edge of mat table/chair, tucking feet back/wider, and performing a big nose over toes to stand up to prevent BLE bracing against mat table. Cues in standing for tall posture and cues to reach posteriorly for controlled descent instead of plopping to the mat table. Performed 10 reps, plus additional reps during session.   GAIT: Gait pattern: step to pattern, decreased step length- Right, decreased step length- Left, decreased stride length, decreased hip/knee flexion- Right, decreased hip/knee flexion- Left, decreased ankle dorsiflexion- Right, decreased ankle dorsiflexion- Left, shuffling, decreased trunk rotation, trunk flexed, narrow BOS, poor foot clearance- Right, and poor foot clearance- Left Distance walked: 230' x 1 with RW  Assistive  device utilized: RW and SPC  Level of assistance: SBA with RW, CGA with SPC  Comments: Pt ambulates into and out of session with use of SPC, pt taking very  short and small steps and holds cane at times instead of placing it on the floor.   Trialed RW for improved stability/decr fall risk and to help pt incr her stride length. With RW, cued for posture, incr step length throughout. Pt needing intermittent cues for pt to avoid hitting obstacles (such as a therapist's stool). Overall, pt able to ambulate with supervision and able to take longer steps compared to use of SPC. Pt's husband reports that they may have one at home and are going to check. Discussed benefits of use of RW to decr risk of falls compared to [facility] but pt will need continued practice. When pt coming back to chair or mat, does take incr time to turn with RW.    Access Code: [email or web address] URL: [email or web address] Date: 12/29/2022 Prepared by: Janann August  Initiated HEP, pt needing verbal/demo cues for proper technique. Discussed having a family member perform with pt at home due to pt's cognition. See MedBridge for further details   Exercises - Sit to Stand with Armchair  - 1-2 x daily - 4-5 x weekly - 1 sets - 10 reps - Standing March with Counter Support  - 1-2 x daily - 4-5 x weekly - 1-2 sets - 10 reps - Side Stepping with Counter Support  - 1-2 x daily - 4-5 x weekly - 3 sets - cues for taking bigger steps and picking up feet   Seated PWR Up - pt needing to follow demo cues from therapist for improved posture, performed 10 reps. Pt did well with demo cues                                                                                           PATIENT EDUCATION: Education details: Personnel officer with RW and how RW can help decr pt's fall risk. Initial HEP for posture, strength, balance  Person educated: Patient and Spouse Education method: Explanation, Demonstration, Verbal cues, and Handouts Education  comprehension: verbalized understanding and needs further education  HOME EXERCISE PROGRAM: Access Code: [email or web address] URL: [email or web address] Date: 12/29/2022 Prepared by: Janann August  Exercises - Sit to Stand with Armchair  - 1-2 x daily - 4-5 x weekly - 1 sets - 10 reps - Standing March with Counter Support  - 1-2 x daily - 4-5 x weekly - 1-2 sets - 10 reps - Side Stepping with Counter Support  - 1-2 x daily - 4-5 x weekly - 3 sets - cues for taking bigger steps and picking up feet   Seated PWR Up                                                       GOALS: Goals reviewed with patient? Yes  SHORT TERM GOALS: Target date: 01/20/2023  Pt will perform initial HEP with min-mod A from husband for improved strength, balance, transfers and gait.  Baseline: not established on eval Goal status: INITIAL  2.  Pt will improve gait velocity to at  least 0.9 ft/s with LRAD and CGA for improved gait efficiency   Baseline: 0.72 ft/s with SPC and CGA Goal status: INITIAL  3.  Pt and husband will verbalize fall prevention techniques in the home and community for improved safety w/mobility.  Baseline:  Goal status: INITIAL  LONG TERM GOALS: Target date: 02/17/2023   Pt will perform final HEP with min-mod A from husband for improved strength, balance, transfers and gait. Baseline:  Goal status: INITIAL  2.  Pt will improve gait velocity to at least 1.2 ft/s w/LRAD and S* for improved gait efficiency   Baseline: 0.72 ft/s w/SPC and CGA Goal status: INITIAL  3.  Pt will improve 5 x STS to less than or equal to 30 seconds w/BUE support to demonstrate improved functional strength and transfer efficiency.   Baseline: 37.07s w/BUE support and significant retropulsion  Goal status: INITIAL  ASSESSMENT:  CLINICAL IMPRESSION: Today's skilled session focused on gait training with RW, sit <> stands and initiating HEP for strength/balance. With use of RW, pt able to ambulate  with supervision and was able to incr her step length, compared to use of SPC. Did need cues at times for obstacle negotiation. With consistent verbal/demo cues pt able to perform sit <> stands with proper technique without BLE bracing and retropulsion. If pt goes to stand without proper cues, pt with tendency to brace against chair. Gave HEP for pt to perform with family cueing and supervision at home due to cognitive deficits. Will continue to progress towards LTGs.    OBJECTIVE IMPAIRMENTS: Abnormal gait, decreased activity tolerance, decreased balance, decreased cognition, decreased coordination, decreased endurance, decreased knowledge of condition, decreased knowledge of use of DME, decreased mobility, difficulty walking, decreased strength, decreased safety awareness, impaired perceived functional ability, improper body mechanics, and pain   ACTIVITY LIMITATIONS: carrying, lifting, standing, squatting, sleeping, stairs, transfers, bathing, toileting, dressing, reach over head, hygiene/grooming, locomotion level, and caring for others  PARTICIPATION LIMITATIONS: meal prep, cleaning, laundry, medication management, personal finances, interpersonal relationship, driving, shopping, community activity, and yard work  PERSONAL FACTORS: Age, Behavior pattern, Fitness, Past/current experiences, and 1 comorbidity: Lewy Body Dementia  are also affecting patient's functional outcome.   REHAB POTENTIAL: Fair due to prognosis of pt's disease  CLINICAL DECISION MAKING: Evolving/moderate complexity  EVALUATION COMPLEXITY: Moderate  PLAN:  PT FREQUENCY: 1x/week  PT DURATION: 8 weeks  PLANNED INTERVENTIONS: Therapeutic exercises, Therapeutic activity, Neuromuscular re-education, Balance training, Gait training, Patient/Family education, Self Care, Joint mobilization, Stair training, DME instructions, Manual therapy, and Re-evaluation  PLAN FOR NEXT SESSION: did they find RW? Try practicing with some  obstacles.  I didn't try rollator cause I didn't know how she would do with brake management. Add to HEP as needed. Work on sit <> stands, strength, turns. Stair training (pt has step to get into house). Scifit for endurance   Arliss Journey, PT, DPT 12/29/2022, 2:49 PM

## 2022-12-31 ENCOUNTER — Ambulatory Visit (INDEPENDENT_AMBULATORY_CARE_PROVIDER_SITE_OTHER): Payer: Medicare PPO | Admitting: Podiatry

## 2022-12-31 ENCOUNTER — Encounter: Payer: Self-pay | Admitting: Podiatry

## 2022-12-31 DIAGNOSIS — M79674 Pain in right toe(s): Secondary | ICD-10-CM | POA: Diagnosis not present

## 2022-12-31 DIAGNOSIS — M79675 Pain in left toe(s): Secondary | ICD-10-CM

## 2022-12-31 DIAGNOSIS — I739 Peripheral vascular disease, unspecified: Secondary | ICD-10-CM

## 2022-12-31 DIAGNOSIS — B351 Tinea unguium: Secondary | ICD-10-CM | POA: Diagnosis not present

## 2022-12-31 DIAGNOSIS — Q828 Other specified congenital malformations of skin: Secondary | ICD-10-CM

## 2022-12-31 NOTE — Progress Notes (Signed)
This patient returns to my office for at risk foot care.  This patient requires this care by a professional since this patient will be at risk due to having PAD, PVD.  This patient has painful callus on her left forefoot.  This callus is painful walking and wearing her shoes.  This patient presents for at risk foot care today.  General Appearance  Alert, conversant and in no acute stress.  Vascular  Dorsalis pedis and posterior tibial  pulses are weakly  palpable  bilaterally.  Capillary return is within normal limits  bilaterally. Temperature is within normal limits  bilaterally.  Neurologic  Senn-Weinstein monofilament wire test within normal limits  bilaterally. Muscle power within normal limits bilaterally.  Nails Thick disfigured discolored nails with subungual debris  from hallux to fifth toes bilaterally. No evidence of bacterial infection or drainage bilaterally.  Orthopedic  No limitations of motion  feet .  No crepitus or effusions noted.  No bony pathology or digital deformities noted.  Skin  normotropic skin noted bilaterally.  No signs of infections or ulcers noted.   Porokeratosis sub 3 left foot.    Porokeratosis sub 3 left foot.   Consent was obtained for treatment procedures.   Debridement of porokeratosis with # 15 blade.  Nails were done as a courtesy.   Return office visit    9 weeks                  Told patient to return for periodic foot care and evaluation due to potential at risk complications.  Padding dispensed for dispersion padding.   Gardiner Barefoot DPM

## 2023-01-01 ENCOUNTER — Telehealth: Payer: Self-pay | Admitting: Physical Medicine and Rehabilitation

## 2023-01-01 NOTE — Telephone Encounter (Signed)
RN from [facility] called and left a voicemail 1/25 @ 9:19am for patient states husband has called requesting status of prior auth for Risperidone , if any additional information needed left her number [phone] or requested to contact patient once Josem Kaufmann is completed

## 2023-01-02 NOTE — Telephone Encounter (Signed)
Called RN at [facility] and left message to let her know that a PA has been submitted. Called patients husband as well to let him know.

## 2023-01-05 NOTE — Telephone Encounter (Signed)
PA resubmitted, pt husband notified.

## 2023-01-06 ENCOUNTER — Telehealth: Payer: Self-pay | Admitting: Physical Medicine and Rehabilitation

## 2023-01-06 ENCOUNTER — Ambulatory Visit: Payer: Medicare PPO | Admitting: Physical Therapy

## 2023-01-06 DIAGNOSIS — R2681 Unsteadiness on feet: Secondary | ICD-10-CM | POA: Diagnosis not present

## 2023-01-06 DIAGNOSIS — Z7409 Other reduced mobility: Secondary | ICD-10-CM | POA: Diagnosis not present

## 2023-01-06 DIAGNOSIS — R2689 Other abnormalities of gait and mobility: Secondary | ICD-10-CM | POA: Diagnosis not present

## 2023-01-06 DIAGNOSIS — M6281 Muscle weakness (generalized): Secondary | ICD-10-CM

## 2023-01-06 DIAGNOSIS — R293 Abnormal posture: Secondary | ICD-10-CM | POA: Diagnosis not present

## 2023-01-06 DIAGNOSIS — G3183 Dementia with Lewy bodies: Secondary | ICD-10-CM | POA: Diagnosis not present

## 2023-01-06 DIAGNOSIS — F0280 Dementia in other diseases classified elsewhere without behavioral disturbance: Secondary | ICD-10-CM | POA: Diagnosis not present

## 2023-01-06 DIAGNOSIS — R296 Repeated falls: Secondary | ICD-10-CM | POA: Diagnosis not present

## 2023-01-06 NOTE — Telephone Encounter (Signed)
PA for Risperidone has been approved until 12/08/23. Husband aware.

## 2023-01-06 NOTE — Telephone Encounter (Signed)
Patient requested refill on Risperidone which needs prior authorization. Please complete  Contact patient once completed

## 2023-01-06 NOTE — Telephone Encounter (Signed)
The RN care manager with [facility] called and said the patient's husband contacted them, he was upset that the prior auth hasn't been sent. The patient is needing a prior auth sent for her risperdal to the [facility] on [address]. The Care Managers number is [phone] but she said to call the patient or the patients husband.

## 2023-01-06 NOTE — Therapy (Signed)
OUTPATIENT PHYSICAL THERAPY NEURO TREATMENT   Patient Name: Kristine [name] MRN: [id number] DOB:1941-03-20, 82 y.o., female Today's Date: 01/06/2023   PCP: Renaye Rakers, MD REFERRING PROVIDER: Genice Rouge, MD  END OF SESSION:  PT End of Session - 01/06/23 1409     Visit Number 3    Number of Visits 9   Plus eval   Date for PT Re-Evaluation 02/24/23    Authorization Type [employer]    PT Start Time 1405    PT Stop Time 1445    PT Time Calculation (min) 40 min    Equipment Utilized During Treatment Gait belt    Activity Tolerance Patient tolerated treatment well    Behavior During Therapy WFL for tasks assessed/performed             Past Medical History:  Diagnosis Date   COPD (chronic obstructive pulmonary disease) (HCC)    Hard of hearing    History of anemia    History of blood transfusion    History of pneumonia    HTN (hypertension)    Hyperlipidemia    Hyperthyroidism    treated wtih radioactive iodine   OA (osteoarthritis of spine)    PVD (peripheral vascular disease) (HCC)    Lower extremity Dopplers  May 2revealed an ABI of 0.83 in the right posterior    Shortness of breath dyspnea    STEMI (ST elevation myocardial infarction) [facility]) May 2011   Promus DES in the proximal, mid and distal RCA in May 2011   Stroke [facility])    Urinary frequency    Urinary urgency    Past Surgical History:  Procedure Laterality Date   ABDOMINAL HYSTERECTOMY     Had ovarian resection and required lysis of adhesions   CARDIAC CATHETERIZATION  04/07/2010   EF 60-70%   CORONARY STENTS     x3   ESOPHAGOGASTRODUODENOSCOPY N/A 03/10/2015   Procedure: ESOPHAGOGASTRODUODENOSCOPY (EGD);  Surgeon: Wandalee Ferdinand, MD;  Location: [facility] ENDOSCOPY;  Service: Endoscopy;  Laterality: N/A;   OVARY SURGERY     TOTAL HIP ARTHROPLASTY  2004   left   TOTAL HIP ARTHROPLASTY Right 01/23/2016   Procedure: RIGHT TOTAL HIP ARTHROPLASTY ANTERIOR APPROACH;  Surgeon: Loreta Ave, MD;  Location: [facility] OR;   Service: Orthopedics;  Laterality: Right;   Patient Active Problem List   Diagnosis Date Noted   Lewy body dementia without behavioral disturbance, psychotic disturbance, mood disturbance, or anxiety (HCC) 06/02/2022   Acute memory impairment 05/16/2022   Impaired functional mobility, balance, and endurance 05/16/2022   Chronic left-sided low back pain with left-sided sciatica 02/05/2022   Lumbar radiculopathy 02/05/2022   Chest pain 05/14/2021   Somniloquy 04/08/2021   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   Snoring 12/19/2020   Excessive daytime sleepiness 12/19/2020   Gait disturbance 12/19/2020   Pain due to onychomycosis of toenails of both feet 05/25/2019   Porokeratosis 05/25/2019   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   Angioedema 05/31/2015   Fever 03/11/2015   Acute blood loss anemia 03/11/2015   Melena 03/10/2015   GI bleed 03/09/2015   Peripheral arterial disease (HCC) 03/01/2012   CAD (coronary artery disease) 01/20/2012   HTN (hypertension)    Hyperlipidemia    PVD (peripheral vascular disease) (HCC)    COPD (chronic obstructive pulmonary disease) (HCC)    Hyperthyroidism    OA (osteoarthritis of spine)    STEMI (ST elevation myocardial infarction) (HCC) 04/07/2010  ONSET DATE: 12/17/2022  REFERRING DIAG: Y78.29,F62.13 (ICD-10-CM) - Lewy body dementia without behavioral disturbance, psychotic disturbance, mood disturbance, or anxiety, unspecified dementia severity (HCC) R29.6 (ICD-10-CM) - Frequent falls Z74.09 (ICD-10-CM) - Impaired functional mobility, balance, and endurance  THERAPY DIAG:  Other abnormalities of gait and mobility  Unsteadiness on feet  Abnormal posture  Muscle weakness (generalized)  Rationale for Evaluation and Treatment: Rehabilitation  SUBJECTIVE:                                                                                                                                                                                              SUBJECTIVE STATEMENT: Pt ambulated into clinic holding cane, cued to place cane on ground. Pt denies any changes, could not find RW but husband thinks it is in utility house so will continue to look for it. No falls.   Pt accompanied by:  Husband, [name], [name].   PERTINENT HISTORY: L THR in 2017; asthma, COPD; CAD s/p stents; hx of stroke- no weakness; HTN, and HLD; chronic low back pain  PAIN:  Are you having pain? No  PRECAUTIONS: Fall  WEIGHT BEARING RESTRICTIONS: No  FALLS: Has patient fallen in last 6 months? Yes. Number of falls 1  LIVING ENVIRONMENT: Lives with: lives with their spouse Lives in: House/apartment Stairs: Yes: Internal: full flight (they do not use these) steps; can reach both and External: 1 steps; none Has following equipment at home: Single point cane  PLOF: Needs assistance with ADLs, Needs assistance with homemaking, Needs assistance with gait, and Needs assistance with transfers  PATIENT GOALS: "I think balance"  OBJECTIVE:   COGNITION: Overall cognitive status: Impaired and History of cognitive impairments - at baseline    TODAY'S TREATMENT:         Ther Ex  SciFit multi-peaks level 2 for 8 minutes using BUE/BLEs for neural priming for reciprocal movement, dynamic cardiovascular warmup and increased amplitude of stepping. Min cues for increased ROM of bilateral knee extension throughout. RPE of 10/10 following activity   NMR  Lateral stepping w/retreat over 4" foam beam w/BUE support on counter, x10 per side w/mod cues for proper technique for improved LE coordination and step length/clearance. Pt frequently hitting beam w/LLE but improved performance on R side.    GAIT: Gait pattern: step to pattern, decreased step length- Right, decreased step length- Left, decreased stride length, decreased hip/knee flexion- Right, decreased hip/knee flexion- Left, decreased ankle  dorsiflexion- Right, decreased ankle dorsiflexion- Left, shuffling, decreased trunk rotation, trunk flexed, narrow BOS, poor foot clearance- Right, and poor foot clearance- Left Distance walked: >500' x 1 with RW  and various clinic distances w/SPC  Assistive device utilized: RW and SPC  Level of assistance: SBA with RW, CGA with SPC Comments: Pt ambulates into and out of session with use of SPC, pt taking very short and small steps and holds cane at times instead of placing it on the floor, requiring cues to properly use cane.   Performed obstacle course w/RW for improved obstacle navigation, AD management and safety at home and then ambulated along various hallways/routes in clinic. Pt needing cues to avoid obstacles on R side and maintain proper distance to RW. Pt frequently ran into wall or hit cones on R side. Noted increased step length/symmetry and speed with use of RW.     TRANSFERS: Assistive device utilized: Single point cane  Sit to stand: SBA/CGA Stand to sit: SBA/CGA  Reiterated proper technique - scooting out towards edge of mat table/chair, tucking feet back/wider, and performing a big nose over toes to stand up to prevent BLE bracing against mat table. Cues in standing for tall posture and cues to reach posteriorly for controlled descent instead of plopping to the mat table. Pt required max cues for proper turn to sit sequence at end of session as she "forgot" how to sit.                                                                                           PATIENT EDUCATION: Education details: Continue HEP, continue looking for RW Person educated: Patient and Spouse Education method: Explanation, Demonstration, Verbal cues, and Handouts Education comprehension: verbalized understanding and needs further education  HOME EXERCISE PROGRAM: Access Code: [email or web address] URL: [email or web address] Date: 12/29/2022 Prepared by: Janann August  Exercises - Sit to  Stand with Armchair  - 1-2 x daily - 4-5 x weekly - 1 sets - 10 reps - Standing March with Counter Support  - 1-2 x daily - 4-5 x weekly - 1-2 sets - 10 reps - Side Stepping with Counter Support  - 1-2 x daily - 4-5 x weekly - 3 sets - cues for taking bigger steps and picking up feet   Seated PWR Up                                                       GOALS: Goals reviewed with patient? Yes  SHORT TERM GOALS: Target date: 01/20/2023  Pt will perform initial HEP with min-mod A from husband for improved strength, balance, transfers and gait.  Baseline: not established on eval Goal status: INITIAL  2.  Pt will improve gait velocity to at least 0.9 ft/s with LRAD and CGA for improved gait efficiency   Baseline: 0.72 ft/s with SPC and CGA Goal status: INITIAL  3.  Pt and husband will verbalize fall prevention techniques in the home and community for improved safety w/mobility.  Baseline:  Goal status: INITIAL  LONG TERM GOALS: Target date: 02/17/2023   Pt will perform final HEP with min-mod A from husband for improved  strength, balance, transfers and gait. Baseline:  Goal status: INITIAL  2.  Pt will improve gait velocity to at least 1.2 ft/s w/LRAD and S* for improved gait efficiency   Baseline: 0.72 ft/s w/SPC and CGA Goal status: INITIAL  3.  Pt will improve 5 x STS to less than or equal to 30 seconds w/BUE support to demonstrate improved functional strength and transfer efficiency.   Baseline: 37.07s w/BUE support and significant retropulsion  Goal status: INITIAL  ASSESSMENT:  CLINICAL IMPRESSION: Emphasis of skilled PT session on AD management, obstacle navigation and increased step length. Pt demonstrates increased step length and clearance with use of RW vs SPC but does tend to run into obstacles on R side, requiring min cues for proper navigation. Pt is safer w/RW but did forget how to turn to sit at end of session, requiring mod cues for proper sequencing and safety,  which is frustrating for pt's husband. Continue POC.    OBJECTIVE IMPAIRMENTS: Abnormal gait, decreased activity tolerance, decreased balance, decreased cognition, decreased coordination, decreased endurance, decreased knowledge of condition, decreased knowledge of use of DME, decreased mobility, difficulty walking, decreased strength, decreased safety awareness, impaired perceived functional ability, improper body mechanics, and pain   ACTIVITY LIMITATIONS: carrying, lifting, standing, squatting, sleeping, stairs, transfers, bathing, toileting, dressing, reach over head, hygiene/grooming, locomotion level, and caring for others  PARTICIPATION LIMITATIONS: meal prep, cleaning, laundry, medication management, personal finances, interpersonal relationship, driving, shopping, community activity, and yard work  PERSONAL FACTORS: Age, Behavior pattern, Fitness, Past/current experiences, and 1 comorbidity: Lewy Body Dementia  are also affecting patient's functional outcome.   REHAB POTENTIAL: Fair due to prognosis of pt's disease  CLINICAL DECISION MAKING: Evolving/moderate complexity  EVALUATION COMPLEXITY: Moderate  PLAN:  PT FREQUENCY: 1x/week  PT DURATION: 8 weeks  PLANNED INTERVENTIONS: Therapeutic exercises, Therapeutic activity, Neuromuscular re-education, Balance training, Gait training, Patient/Family education, Self Care, Joint mobilization, Stair training, DME instructions, Manual therapy, and Re-evaluation  PLAN FOR NEXT SESSION: did they find RW? Continue practicing with some obstacles (especially on R side). I didn't try rollator cause I didn't know how she would do with brake management. Add to HEP as needed. Work on sit <> stands, strength, turns. Stair training (pt has step to get into house). Scifit for endurance   Cruzita Lederer Ebb Carelock, PT, DPT 01/06/2023, 2:46 PM

## 2023-01-07 NOTE — Telephone Encounter (Signed)
PA has been approved and Mr Kristine [name] has been contacted

## 2023-01-10 ENCOUNTER — Other Ambulatory Visit: Payer: Self-pay | Admitting: Family

## 2023-01-10 DIAGNOSIS — I10 Essential (primary) hypertension: Secondary | ICD-10-CM

## 2023-01-13 ENCOUNTER — Ambulatory Visit: Payer: Medicare PPO | Admitting: Physical Therapy

## 2023-01-13 DIAGNOSIS — M6281 Muscle weakness (generalized): Secondary | ICD-10-CM

## 2023-01-13 DIAGNOSIS — R2681 Unsteadiness on feet: Secondary | ICD-10-CM

## 2023-01-13 DIAGNOSIS — R293 Abnormal posture: Secondary | ICD-10-CM

## 2023-01-13 DIAGNOSIS — R2689 Other abnormalities of gait and mobility: Secondary | ICD-10-CM | POA: Insufficient documentation

## 2023-01-13 NOTE — Therapy (Signed)
OUTPATIENT PHYSICAL THERAPY NEURO TREATMENT- ARRIVED NO CHARGE   Patient Name: Kristine [name] MRN: [id number] DOB:11-28-1941, 82 y.o., female Today's Date: 01/13/2023   PCP: Renaye Rakers, MD REFERRING PROVIDER: Genice Rouge, MD  END OF SESSION:  PT End of Session - 01/13/23 1323     Visit Number 3   Arrived no charge   Number of Visits 9   Plus eval   Date for PT Re-Evaluation 02/24/23    Authorization Type [employer]    PT Start Time 1318    PT Stop Time 1404    PT Time Calculation (min) 46 min    Equipment Utilized During Treatment --    Activity Tolerance Patient tolerated treatment well    Behavior During Therapy WFL for tasks assessed/performed             Past Medical History:  Diagnosis Date   COPD (chronic obstructive pulmonary disease) (HCC)    Hard of hearing    History of anemia    History of blood transfusion    History of pneumonia    HTN (hypertension)    Hyperlipidemia    Hyperthyroidism    treated wtih radioactive iodine   OA (osteoarthritis of spine)    PVD (peripheral vascular disease) (HCC)    Lower extremity Dopplers  May 2revealed an ABI of 0.83 in the right posterior    Shortness of breath dyspnea    STEMI (ST elevation myocardial infarction) [facility]) May 2011   Promus DES in the proximal, mid and distal RCA in May 2011   Stroke [facility])    Urinary frequency    Urinary urgency    Past Surgical History:  Procedure Laterality Date   ABDOMINAL HYSTERECTOMY     Had ovarian resection and required lysis of adhesions   CARDIAC CATHETERIZATION  04/07/2010   EF 60-70%   CORONARY STENTS     x3   ESOPHAGOGASTRODUODENOSCOPY N/A 03/10/2015   Procedure: ESOPHAGOGASTRODUODENOSCOPY (EGD);  Surgeon: Wandalee Ferdinand, MD;  Location: [facility] ENDOSCOPY;  Service: Endoscopy;  Laterality: N/A;   OVARY SURGERY     TOTAL HIP ARTHROPLASTY  2004   left   TOTAL HIP ARTHROPLASTY Right 01/23/2016   Procedure: RIGHT TOTAL HIP ARTHROPLASTY ANTERIOR APPROACH;  Surgeon: Loreta Ave, MD;  Location: [facility] OR;  Service: Orthopedics;  Laterality: Right;   Patient Active Problem List   Diagnosis Date Noted   Lewy body dementia without behavioral disturbance, psychotic disturbance, mood disturbance, or anxiety (HCC) 06/02/2022   Acute memory impairment 05/16/2022   Impaired functional mobility, balance, and endurance 05/16/2022   Chronic left-sided low back pain with left-sided sciatica 02/05/2022   Lumbar radiculopathy 02/05/2022   Chest pain 05/14/2021   Somniloquy 04/08/2021   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   Snoring 12/19/2020   Excessive daytime sleepiness 12/19/2020   Gait disturbance 12/19/2020   Pain due to onychomycosis of toenails of both feet 05/25/2019   Porokeratosis 05/25/2019   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   Angioedema 05/31/2015   Fever 03/11/2015   Acute blood loss anemia 03/11/2015   Melena 03/10/2015   GI bleed 03/09/2015   Peripheral arterial disease (HCC) 03/01/2012   CAD (coronary artery disease) 01/20/2012   HTN (hypertension)    Hyperlipidemia    PVD (peripheral vascular disease) (HCC)    COPD (chronic obstructive pulmonary disease) (HCC)    Hyperthyroidism    OA (osteoarthritis of spine)    STEMI (ST elevation myocardial  infarction) ([place]) 04/07/2010    ONSET DATE: 12/17/2022  REFERRING DIAG: E31.54,M08.67 (ICD-10-CM) - Lewy body dementia without behavioral disturbance, psychotic disturbance, mood disturbance, or anxiety, unspecified dementia severity (HCC) R29.6 (ICD-10-CM) - Frequent falls Z74.09 (ICD-10-CM) - Impaired functional mobility, balance, and endurance  THERAPY DIAG:  Other abnormalities of gait and mobility  Unsteadiness on feet  Abnormal posture  Muscle weakness (generalized)  Rationale for Evaluation and Treatment: Rehabilitation  SUBJECTIVE:                                                                                                                                                                                              SUBJECTIVE STATEMENT: Pt ambulated into clinic holding cane, cued to place cane on ground. Pt reports she is having pain across her stomach, started yesterday and is off and on today. Unable to provide more details. Husband has been unable to obtain RW. No falls  Pt accompanied by:  Husband, [name], [name].   PERTINENT HISTORY: L THR in 2017; asthma, COPD; CAD s/p stents; hx of stroke- no weakness; HTN, and HLD; chronic low back pain  PAIN:  Are you having pain? No  PRECAUTIONS: Fall  WEIGHT BEARING RESTRICTIONS: No  FALLS: Has patient fallen in last 6 months? Yes. Number of falls 1  LIVING ENVIRONMENT: Lives with: lives with their spouse Lives in: House/apartment Stairs: Yes: Internal: full flight (they do not use these) steps; can reach both and External: 1 steps; none Has following equipment at home: Single point cane  PLOF: Needs assistance with ADLs, Needs assistance with homemaking, Needs assistance with gait, and Needs assistance with transfers  PATIENT GOALS: "I think balance"  OBJECTIVE:   COGNITION: Overall cognitive status: Impaired and History of cognitive impairments - at baseline    TODAY'S TREATMENT:         Entirety of session spent discussing POC and prognosis for Lewy Body Dementia. At this time, pt is continuing to benefit from OPPT for reduced fall risk, improved safety w/ambulation and prolonged independence. Pt's husband in agreement to continue to trial various ADs in clinic to determine safest option for home, as pt currently carrying SPC around rather than using it. Educated husband on the role PT plays with pt's diagnosis and answered all relevant questions.     TRANSFERS: Assistive device utilized: Single point cane  Sit to stand: SBA/CGA Stand to sit: SBA/CGA  PATIENT EDUCATION: Education details: Continue HEP, continue looking for RW Person educated: Patient and Spouse Education method: Explanation, Demonstration, Verbal cues, and Handouts Education comprehension: verbalized understanding and needs further education  HOME EXERCISE PROGRAM: Access Code: [email or web address] URL: [email or web address] Date: 12/29/2022 Prepared by: Janann August  Exercises - Sit to Stand with Armchair  - 1-2 x daily - 4-5 x weekly - 1 sets - 10 reps - Standing March with Counter Support  - 1-2 x daily - 4-5 x weekly - 1-2 sets - 10 reps - Side Stepping with Counter Support  - 1-2 x daily - 4-5 x weekly - 3 sets - cues for taking bigger steps and picking up feet   Seated PWR Up                                                       GOALS: Goals reviewed with patient? Yes  SHORT TERM GOALS: Target date: 01/20/2023  Pt will perform initial HEP with min-mod A from husband for improved strength, balance, transfers and gait.  Baseline: not established on eval Goal status: INITIAL  2.  Pt will improve gait velocity to at least 0.9 ft/s with LRAD and CGA for improved gait efficiency   Baseline: 0.72 ft/s with SPC and CGA Goal status: INITIAL  3.  Pt and husband will verbalize fall prevention techniques in the home and community for improved safety w/mobility.  Baseline:  Goal status: INITIAL  LONG TERM GOALS: Target date: 02/17/2023   Pt will perform final HEP with min-mod A from husband for improved strength, balance, transfers and gait. Baseline:  Goal status: INITIAL  2.  Pt will improve gait velocity to at least 1.2 ft/s w/LRAD and S* for improved gait efficiency   Baseline: 0.72 ft/s w/SPC and CGA Goal status: INITIAL  3.  Pt will improve 5 x STS to less than or equal to 30 seconds w/BUE support to demonstrate improved functional strength and transfer efficiency.   Baseline: 37.07s w/BUE support and significant retropulsion  Goal  status: INITIAL  ASSESSMENT:  CLINICAL IMPRESSION: Arrived no charge as session was spent providing education to pt's husband.    OBJECTIVE IMPAIRMENTS: Abnormal gait, decreased activity tolerance, decreased balance, decreased cognition, decreased coordination, decreased endurance, decreased knowledge of condition, decreased knowledge of use of DME, decreased mobility, difficulty walking, decreased strength, decreased safety awareness, impaired perceived functional ability, improper body mechanics, and pain   ACTIVITY LIMITATIONS: carrying, lifting, standing, squatting, sleeping, stairs, transfers, bathing, toileting, dressing, reach over head, hygiene/grooming, locomotion level, and caring for others  PARTICIPATION LIMITATIONS: meal prep, cleaning, laundry, medication management, personal finances, interpersonal relationship, driving, shopping, community activity, and yard work  PERSONAL FACTORS: Age, Behavior pattern, Fitness, Past/current experiences, and 1 comorbidity: Lewy Body Dementia  are also affecting patient's functional outcome.   REHAB POTENTIAL: Fair due to prognosis of pt's disease  CLINICAL DECISION MAKING: Evolving/moderate complexity  EVALUATION COMPLEXITY: Moderate  PLAN:  PT FREQUENCY: 1x/week  PT DURATION: 8 weeks  PLANNED INTERVENTIONS: Therapeutic exercises, Therapeutic activity, Neuromuscular re-education, Balance training, Gait training, Patient/Family education, Self Care, Joint mobilization, Stair training, DME instructions, Manual therapy, and Re-evaluation  PLAN FOR NEXT SESSION: did they find RW? Continue practicing with some obstacles (especially on R side). I didn't try rollator cause I didn't know how she would do with brake management.  Add to HEP as needed. Work on sit <> stands, strength, turns. Stair training (pt has step to get into house). Scifit for endurance   Cruzita Lederer Tajae Maiolo, PT, DPT 01/13/2023, 2:04 PM

## 2023-01-20 ENCOUNTER — Ambulatory Visit: Payer: Medicare PPO | Attending: Physical Medicine and Rehabilitation | Admitting: Physical Therapy

## 2023-01-20 DIAGNOSIS — R293 Abnormal posture: Secondary | ICD-10-CM

## 2023-01-20 DIAGNOSIS — R2681 Unsteadiness on feet: Secondary | ICD-10-CM | POA: Diagnosis not present

## 2023-01-20 DIAGNOSIS — M6281 Muscle weakness (generalized): Secondary | ICD-10-CM | POA: Diagnosis not present

## 2023-01-20 DIAGNOSIS — R2689 Other abnormalities of gait and mobility: Secondary | ICD-10-CM | POA: Diagnosis not present

## 2023-01-20 NOTE — Therapy (Signed)
OUTPATIENT PHYSICAL THERAPY NEURO TREATMENT    Patient Name: Kristine [name] MRN: [id number] DOB:09/27/1941, 82 y.o., female Today's Date: 01/20/2023   PCP: Lucianne Lei, MD REFERRING PROVIDER: Courtney Heys, MD  END OF SESSION:  PT End of Session - 01/20/23 1325     Visit Number 4    Number of Visits 9   Plus eval   Date for PT Re-Evaluation 02/24/23    Authorization Type [employer]    PT Start Time 45   Pt arrived late   PT Stop Time 1407    PT Time Calculation (min) 47 min    Equipment Utilized During Treatment Gait belt    Activity Tolerance Patient tolerated treatment well    Behavior During Therapy WFL for tasks assessed/performed             Past Medical History:  Diagnosis Date   COPD (chronic obstructive pulmonary disease) ([place])    Hard of hearing    History of anemia    History of blood transfusion    History of pneumonia    HTN (hypertension)    Hyperlipidemia    Hyperthyroidism    treated wtih radioactive iodine   OA (osteoarthritis of spine)    PVD (peripheral vascular disease) ([place])    Lower extremity Dopplers  May 2revealed an ABI of 0.83 in the right posterior    Shortness of breath dyspnea    STEMI (ST elevation myocardial infarction) [facility]) May 2011   Promus DES in the proximal, mid and distal RCA in May 2011   Stroke [facility])    Urinary frequency    Urinary urgency    Past Surgical History:  Procedure Laterality Date   ABDOMINAL HYSTERECTOMY     Had ovarian resection and required lysis of adhesions   CARDIAC CATHETERIZATION  04/07/2010   EF 60-70%   CORONARY STENTS     x3   ESOPHAGOGASTRODUODENOSCOPY N/A 03/10/2015   Procedure: ESOPHAGOGASTRODUODENOSCOPY (EGD);  Surgeon: Acquanetta Sit, MD;  Location: [place];  Service: Endoscopy;  Laterality: N/A;   OVARY SURGERY     TOTAL HIP ARTHROPLASTY  2004   left   TOTAL HIP ARTHROPLASTY Right 01/23/2016   Procedure: RIGHT TOTAL HIP ARTHROPLASTY ANTERIOR APPROACH;  Surgeon: Ninetta Lights, MD;   Location: [place];  Service: Orthopedics;  Laterality: Right;   Patient Active Problem List   Diagnosis Date Noted   Lewy body dementia without behavioral disturbance, psychotic disturbance, mood disturbance, or anxiety ([place]) 06/02/2022   Acute memory impairment 05/16/2022   Impaired functional mobility, balance, and endurance 05/16/2022   Chronic left-sided low back pain with left-sided sciatica 02/05/2022   Lumbar radiculopathy 02/05/2022   Chest pain 05/14/2021   Somniloquy 04/08/2021   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   Snoring 12/19/2020   Excessive daytime sleepiness 12/19/2020   Gait disturbance 12/19/2020   Pain due to onychomycosis of toenails of both feet 05/25/2019   Porokeratosis 05/25/2019   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   Angioedema 05/31/2015   Fever 03/11/2015   Acute blood loss anemia 03/11/2015   Melena 03/10/2015   GI bleed 03/09/2015   Peripheral arterial disease ([place]) 03/01/2012   CAD (coronary artery disease) 01/20/2012   HTN (hypertension)    Hyperlipidemia    PVD (peripheral vascular disease) (HCC)    COPD (chronic obstructive pulmonary disease) ([place])    Hyperthyroidism    OA (osteoarthritis of spine)    STEMI (ST elevation myocardial infarction) (  [place]) 04/07/2010    ONSET DATE: 12/17/2022  REFERRING DIAG: [id number] (ICD-10-CM) - Lewy body dementia without behavioral disturbance, psychotic disturbance, mood disturbance, or anxiety, unspecified dementia severity (HCC) R29.6 (ICD-10-CM) - Frequent falls Z74.09 (ICD-10-CM) - Impaired functional mobility, balance, and endurance  THERAPY DIAG:  Other abnormalities of gait and mobility  Unsteadiness on feet  Abnormal posture  Rationale for Evaluation and Treatment: Rehabilitation  SUBJECTIVE:                                                                                                                                                                                              SUBJECTIVE STATEMENT: Pt ambulated into clinic w/SPC. Reports she is having an "off" brain day. No falls.   Pt accompanied by:  Husband, [name], [name]. And daughter  PERTINENT HISTORY: L THR in 2017; asthma, COPD; CAD s/p stents; hx of stroke- no weakness; HTN, and HLD; chronic low back pain  PAIN:  Are you having pain? No  PRECAUTIONS: Fall  WEIGHT BEARING RESTRICTIONS: No  FALLS: Has patient fallen in last 6 months? Yes. Number of falls 1  LIVING ENVIRONMENT: Lives with: lives with their spouse Lives in: House/apartment Stairs: Yes: Internal: full flight (they do not use these) steps; can reach both and External: 1 steps; none Has following equipment at home: Single point cane  PLOF: Needs assistance with ADLs, Needs assistance with homemaking, Needs assistance with gait, and Needs assistance with transfers  PATIENT GOALS: "I think balance"  OBJECTIVE:   COGNITION: Overall cognitive status: Impaired and History of cognitive impairments - at baseline    TODAY'S TREATMENT:         Ther Ex  SciFit multi-peaks level 3 for 8 minutes using BUE/BLEs for neural priming for reciprocal movement, dynamic cardiovascular warmup and increased amplitude of stepping.    Ther Act  STG Assessment   [facility] PT Assessment - 01/20/23 1346       Ambulation/Gait   Gait velocity 32.8' over 28.72s = 1.14 ft/s w/SPC   32.8' over 27.56s = 1.19 ft/s w/RW           Reviewed purpose of practicing w/RW in clinic with daughter and husband and daughter reports pt's home has a lot of small step-ups in home and is concerned about RW tipping forward if pt does not appropriately pick it up. Will plan to practice this in clinic for improved safety at home   Husband requested education on proper bed mobility, as he reports pt sleeps too close to edge of bed and he is afraid she will roll off during the night. Reviewed proper  bed mobility with pt  and encouraged her to scoot her bottom all the way back onto bed prior to swinging her legs in to ensure she is far enough back in bed. Pt and husband verbalized understanding.     TRANSFERS: Assistive device utilized: Single point cane  Sit to stand: SBA/CGA Stand to sit: SBA/CGA Comments: Pt required min cues for proper sit <>stand sequence due to retropulsion throughout session                                                                                           PATIENT EDUCATION: Education details: Continue HEP, goal outcomes  Person educated: Patient and Spouse Education method: Explanation, Demonstration, Verbal cues, and Handouts Education comprehension: verbalized understanding and needs further education  HOME EXERCISE PROGRAM: Access Code: [email or web address] URL: [email or web address] Date: 12/29/2022 Prepared by: Janann August  Exercises - Sit to Stand with Armchair  - 1-2 x daily - 4-5 x weekly - 1 sets - 10 reps - Standing March with Counter Support  - 1-2 x daily - 4-5 x weekly - 1-2 sets - 10 reps - Side Stepping with Counter Support  - 1-2 x daily - 4-5 x weekly - 3 sets - cues for taking bigger steps and picking up feet   Seated PWR Up                                                       GOALS: Goals reviewed with patient? Yes  SHORT TERM GOALS: Target date: 01/20/2023  Pt will perform initial HEP with min-mod A from husband for improved strength, balance, transfers and gait.  Baseline:  Goal status: MET  2.  Pt will improve gait velocity to at least 0.9 ft/s with LRAD and CGA for improved gait efficiency   Baseline: 0.72 ft/s with SPC and CGA; 1.14 ft/s w/SPC Goal status: MET  3.  Pt and husband will verbalize fall prevention techniques in the home and community for improved safety w/mobility.  Baseline:  Goal status: IN PROGRESS  LONG TERM GOALS: Target date: 02/17/2023   Pt will perform final HEP with min-mod A from husband for  improved strength, balance, transfers and gait. Baseline:  Goal status: INITIAL  2.  Pt will improve gait velocity to at least 1.2 ft/s w/LRAD and S* for improved gait efficiency   Baseline: 0.72 ft/s w/SPC and CGA Goal status: INITIAL  3.  Pt will improve 5 x STS to less than or equal to 30 seconds w/BUE support to demonstrate improved functional strength and transfer efficiency.   Baseline: 37.07s w/BUE support and significant retropulsion  Goal status: INITIAL  ASSESSMENT:  CLINICAL IMPRESSION: Emphasis of skilled PT session on STG assessment. Pt has met 2 of 3 STGs so far, performing her HEP w/husband when able and improving her gait speed to 1.14 ft/s w/SPC and 1.19 ft/s w/RW. At this time, therapist continues to educate pt and family on importance of using AD at home,  preferably RW, as pt is safer with this. Will need to practice navigating obstacles/ledges w/RW to imitate home environment. Pt did have increased incidence of retropulsion w/sit <>stands today, requiring min cues for proper sequencing and will continue to benefit from practice in clinic. Continue POC.    OBJECTIVE IMPAIRMENTS: Abnormal gait, decreased activity tolerance, decreased balance, decreased cognition, decreased coordination, decreased endurance, decreased knowledge of condition, decreased knowledge of use of DME, decreased mobility, difficulty walking, decreased strength, decreased safety awareness, impaired perceived functional ability, improper body mechanics, and pain   ACTIVITY LIMITATIONS: carrying, lifting, standing, squatting, sleeping, stairs, transfers, bathing, toileting, dressing, reach over head, hygiene/grooming, locomotion level, and caring for others  PARTICIPATION LIMITATIONS: meal prep, cleaning, laundry, medication management, personal finances, interpersonal relationship, driving, shopping, community activity, and yard work  PERSONAL FACTORS: Age, Behavior pattern, Fitness, Past/current  experiences, and 1 comorbidity: Lewy Body Dementia  are also affecting patient's functional outcome.   REHAB POTENTIAL: Fair due to prognosis of pt's disease  CLINICAL DECISION MAKING: Evolving/moderate complexity  EVALUATION COMPLEXITY: Moderate  PLAN:  PT FREQUENCY: 1x/week  PT DURATION: 8 weeks  PLANNED INTERVENTIONS: Therapeutic exercises, Therapeutic activity, Neuromuscular re-education, Balance training, Gait training, Patient/Family education, Self Care, Joint mobilization, Stair training, DME instructions, Manual therapy, and Re-evaluation  PLAN FOR NEXT SESSION: did they find RW? Continue practicing with some obstacles (especially on R side). I didn't try rollator cause I didn't know how she would do with brake management. Add to HEP as needed. Work on sit <> stands, strength, turns. Stair training (pt has step to get into house). Scifit for endurance, ledge navigation w/RW   Cruzita Lederer Benay Pomeroy, PT, DPT 01/20/2023, 2:10 PM

## 2023-01-22 ENCOUNTER — Telehealth: Payer: Self-pay | Admitting: Neurology

## 2023-01-22 MED ORDER — HYDROXYZINE HCL 10 MG PO TABS: ORAL_TABLET | ORAL | 3 refills | Status: DC

## 2023-01-22 NOTE — Telephone Encounter (Signed)
Called daughter back to get more information. Sx started to worsen over the last few days. No hx UTI's. No current sx UTI. States mother thinking she sees her Uncles that have passed away. Her sister lives next door. Yarelin sees Uncles in her yard. Thinks Uncles are cooking her breakfast/sees her father that has passed. States there are children in the house that she has to get ready for school and there are not.   Husband not getting good quality sleep. Dr Dagoberto Ligas (pain MD) added low dose risperidal 0.56m BID prn but husband only gives to her at bed. She wakes up during the night, wants to eat a full meal d//t thinking its breakfast time. Goes back to sleep and wakes up around 7-8am.   Also has had some episodes where [facility]. Denies any other new meds started recently.   Her next f/u with Dr. SFelecia Shellingis 02/24/23. No sooner appt to offer at this time. Aware I will send to MD to review and call back with recommendation.

## 2023-01-22 NOTE — Telephone Encounter (Signed)
Per Dr.Sater "We can add hydroxyzine 10 mg #90 3  refills   1 or 2 p.o. twice a day as needed for agitation.       I called patient daughter Bethena Roys and she is aware. Rx sent.

## 2023-01-22 NOTE — Telephone Encounter (Signed)
Pt daughter is calling. Stated pt has increase confusion and stated she is calling her husband " DADDY". States she feels like the medication Dr. Felecia Shelling is giving pt isn't working. Stated the state of the pt is overwhelming for caregiver.  She wants to know if anything else can be done to help her parents out.

## 2023-01-27 ENCOUNTER — Ambulatory Visit: Payer: Medicare PPO | Admitting: Physical Therapy

## 2023-01-27 DIAGNOSIS — R2681 Unsteadiness on feet: Secondary | ICD-10-CM | POA: Diagnosis not present

## 2023-01-27 DIAGNOSIS — R2689 Other abnormalities of gait and mobility: Secondary | ICD-10-CM | POA: Diagnosis not present

## 2023-01-27 DIAGNOSIS — M6281 Muscle weakness (generalized): Secondary | ICD-10-CM

## 2023-01-27 DIAGNOSIS — R293 Abnormal posture: Secondary | ICD-10-CM | POA: Diagnosis not present

## 2023-01-27 MED ORDER — QUETIAPINE FUMARATE 25 MG PO TABS: 12.5000 mg | ORAL_TABLET | Freq: Every day | ORAL | 0 refills | Status: DC

## 2023-01-27 NOTE — Telephone Encounter (Signed)
Pt daughter Bethena Roys informed.

## 2023-01-27 NOTE — Therapy (Signed)
OUTPATIENT PHYSICAL THERAPY NEURO TREATMENT    Patient Name: Kristine [name] MRN: [id number] DOB:04/05/1941, 82 y.o., female Today's Date: 01/27/2023   PCP: Lucianne Lei, MD REFERRING PROVIDER: Courtney Heys, MD  END OF SESSION:  PT End of Session - 01/27/23 1109     Visit Number 5    Number of Visits 9   Plus eval   Date for PT Re-Evaluation 02/24/23    Authorization Type [employer]    PT Start Time 1101    PT Stop Time 1148    PT Time Calculation (min) 47 min    Equipment Utilized During Treatment Gait belt    Activity Tolerance Patient tolerated treatment well    Behavior During Therapy WFL for tasks assessed/performed              Past Medical History:  Diagnosis Date   COPD (chronic obstructive pulmonary disease) ([place])    Hard of hearing    History of anemia    History of blood transfusion    History of pneumonia    HTN (hypertension)    Hyperlipidemia    Hyperthyroidism    treated wtih radioactive iodine   OA (osteoarthritis of spine)    PVD (peripheral vascular disease) ([place])    Lower extremity Dopplers  May 2revealed an ABI of 0.83 in the right posterior    Shortness of breath dyspnea    STEMI (ST elevation myocardial infarction) [facility]) May 2011   Promus DES in the proximal, mid and distal RCA in May 2011   Stroke [facility])    Urinary frequency    Urinary urgency    Past Surgical History:  Procedure Laterality Date   ABDOMINAL HYSTERECTOMY     Had ovarian resection and required lysis of adhesions   CARDIAC CATHETERIZATION  04/07/2010   EF 60-70%   CORONARY STENTS     x3   ESOPHAGOGASTRODUODENOSCOPY N/A 03/10/2015   Procedure: ESOPHAGOGASTRODUODENOSCOPY (EGD);  Surgeon: Acquanetta Sit, MD;  Location: [place];  Service: Endoscopy;  Laterality: N/A;   OVARY SURGERY     TOTAL HIP ARTHROPLASTY  2004   left   TOTAL HIP ARTHROPLASTY Right 01/23/2016   Procedure: RIGHT TOTAL HIP ARTHROPLASTY ANTERIOR APPROACH;  Surgeon: Ninetta Lights, MD;  Location: [place];  Service: Orthopedics;  Laterality: Right;   Patient Active Problem List   Diagnosis Date Noted   Lewy body dementia without behavioral disturbance, psychotic disturbance, mood disturbance, or anxiety ([place]) 06/02/2022   Acute memory impairment 05/16/2022   Impaired functional mobility, balance, and endurance 05/16/2022   Chronic left-sided low back pain with left-sided sciatica 02/05/2022   Lumbar radiculopathy 02/05/2022   Chest pain 05/14/2021   Somniloquy 04/08/2021   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   Snoring 12/19/2020   Excessive daytime sleepiness 12/19/2020   Gait disturbance 12/19/2020   Pain due to onychomycosis of toenails of both feet 05/25/2019   Porokeratosis 05/25/2019   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   Angioedema 05/31/2015   Fever 03/11/2015   Acute blood loss anemia 03/11/2015   Melena 03/10/2015   GI bleed 03/09/2015   Peripheral arterial disease ([place]) 03/01/2012   CAD (coronary artery disease) 01/20/2012   HTN (hypertension)    Hyperlipidemia    PVD (peripheral vascular disease) ([facility])    COPD (chronic obstructive pulmonary disease) ([place])    Hyperthyroidism    OA (osteoarthritis of spine)    STEMI (ST elevation myocardial infarction) ([place]) 04/07/2010  ONSET DATE: 12/17/2022  REFERRING DIAG: [id number] (ICD-10-CM) - Lewy body dementia without behavioral disturbance, psychotic disturbance, mood disturbance, or anxiety, unspecified dementia severity (HCC) R29.6 (ICD-10-CM) - Frequent falls Z74.09 (ICD-10-CM) - Impaired functional mobility, balance, and endurance  THERAPY DIAG:  Other abnormalities of gait and mobility  Unsteadiness on feet  Muscle weakness (generalized)  Rationale for Evaluation and Treatment: Rehabilitation  SUBJECTIVE:                                                                                                                                                                                              SUBJECTIVE STATEMENT: Pt ambulated into clinic w/RW. Has had one fall since last session while walking from lawn onto driveway, daughter was there to brace fall. Having some soreness down her lateral LLE.   Pt accompanied by:  Husband, [name], [name]. And daughter  PERTINENT HISTORY: L THR in 2017; asthma, COPD; CAD s/p stents; hx of stroke- no weakness; HTN, and HLD; chronic low back pain  PAIN:  Are you having pain? Yes: NPRS scale: 5/10 Pain location: LLE  Pain description: achy/throb  PRECAUTIONS: Fall  WEIGHT BEARING RESTRICTIONS: No  FALLS: Has patient fallen in last 6 months? Yes. Number of falls 1  LIVING ENVIRONMENT: Lives with: lives with their spouse Lives in: House/apartment Stairs: Yes: Internal: full flight (they do not use these) steps; can reach both and External: 1 steps; none Has following equipment at home: Single point cane  PLOF: Needs assistance with ADLs, Needs assistance with homemaking, Needs assistance with gait, and Needs assistance with transfers  PATIENT GOALS: "I think balance"  OBJECTIVE:   COGNITION: Overall cognitive status: Impaired and History of cognitive impairments - at baseline    TODAY'S TREATMENT:         NMR  4 blaze pods placed in semi-circle pattern on random reach setting for improved visual scanning, LE coordination and single leg stability. 3 rounds of 2 minutes on, 30s off. Min guard throughout for safety  Round 1- 7 hits. Noted pt only tapping w/RLE and required mod cues for sustained attention to task  Round 2- 6 hits. Mod cues to tap w/LLE, which caused pt to lose balance to L side. Min A for LOB correction  Round 3- 9 hits. Pt able to scan for pods without cues but predominantly tapped w/RLE.   Gait pattern: step to pattern, decreased step length- Right, decreased step length- Left, decreased stride length, decreased hip/knee flexion- Right, decreased hip/knee flexion-  Left, decreased ankle dorsiflexion- Right, decreased ankle dorsiflexion- Left, shuffling, and trunk flexed Distance walked: Various clinic distances and  over threshold to imitate house environment  Assistive device utilized: Walker - 2 wheeled Level of assistance: SBA Comments: Practiced navigating door ledge to imitate home environment, as pt has several small step up/downs in home. Pt able to pick up RW and place over ledge independently without cues. Per husband, pt doing well with this at home. Pt does require mod-max cues to maintain safe distance to RW w/turns and to fully turn prior to sitting. Pt emotionally labile throughout gait training, crying due to "falling three times" followed by laughter. Pt later cried due to "not being able to finish a thought", so provided therapeutic listening as appropriate     Ther Ex  SciFit level 2 for 6 minutes using BUE/BLEs for neural priming for reciprocal movement, dynamic cardiovascular conditioning and increased amplitude of stepping. RPE of 9/10 following activity                                                                                            PATIENT EDUCATION: Education details: Continue HEP and use of RW in house  Person educated: Patient and Spouse Education method: Explanation, Media planner, Verbal cues, and Handouts Education comprehension: verbalized understanding and needs further education  HOME EXERCISE PROGRAM: Access Code: [email or web address] URL: [email or web address] Date: 12/29/2022 Prepared by: Janann August  Exercises - Sit to Stand with Armchair  - 1-2 x daily - 4-5 x weekly - 1 sets - 10 reps - Standing March with Counter Support  - 1-2 x daily - 4-5 x weekly - 1-2 sets - 10 reps - Side Stepping with Counter Support  - 1-2 x daily - 4-5 x weekly - 3 sets - cues for taking bigger steps and picking up feet   Seated PWR Up                                                       GOALS: Goals reviewed with  patient? Yes  SHORT TERM GOALS: Target date: 01/20/2023  Pt will perform initial HEP with min-mod A from husband for improved strength, balance, transfers and gait.  Baseline:  Goal status: MET  2.  Pt will improve gait velocity to at least 0.9 ft/s with LRAD and CGA for improved gait efficiency   Baseline: 0.72 ft/s with SPC and CGA; 1.14 ft/s w/SPC Goal status: MET  3.  Pt and husband will verbalize fall prevention techniques in the home and community for improved safety w/mobility.  Baseline:  Goal status: IN PROGRESS  LONG TERM GOALS: Target date: 02/17/2023   Pt will perform final HEP with min-mod A from husband for improved strength, balance, transfers and gait. Baseline:  Goal status: INITIAL  2.  Pt will improve gait velocity to at least 1.2 ft/s w/LRAD and S* for improved gait efficiency   Baseline: 0.72 ft/s w/SPC and CGA Goal status: INITIAL  3.  Pt will improve 5 x STS to less than or equal to 30 seconds w/BUE support to demonstrate improved  functional strength and transfer efficiency.   Baseline: 37.07s w/BUE support and significant retropulsion  Goal status: INITIAL  ASSESSMENT:  CLINICAL IMPRESSION: Emphasis of skilled PT session on gait training w/RW, LE coordination and visual scanning. Pt emotionally labile throughout session but could be redirected easily. Pt ambulates well w/use of RW and husband reports compliance w/use at home. Pt did have fall in driveway last week, but daughter present to assist. Pt requires mod-max concurrent cues to sustain attention to task and participate in PT once fatigued. Noted pt only tapping blaze pods w/RLE and turning to L side, requiring cues to use BLEs and turn both directions. Increased LOB incidence when tapping w/LLE. Continue POC.    OBJECTIVE IMPAIRMENTS: Abnormal gait, decreased activity tolerance, decreased balance, decreased cognition, decreased coordination, decreased endurance, decreased knowledge of condition,  decreased knowledge of use of DME, decreased mobility, difficulty walking, decreased strength, decreased safety awareness, impaired perceived functional ability, improper body mechanics, and pain   ACTIVITY LIMITATIONS: carrying, lifting, standing, squatting, sleeping, stairs, transfers, bathing, toileting, dressing, reach over head, hygiene/grooming, locomotion level, and caring for others  PARTICIPATION LIMITATIONS: meal prep, cleaning, laundry, medication management, personal finances, interpersonal relationship, driving, shopping, community activity, and yard work  PERSONAL FACTORS: Age, Behavior pattern, Fitness, Past/current experiences, and 1 comorbidity: Lewy Body Dementia  are also affecting patient's functional outcome.   REHAB POTENTIAL: Fair due to prognosis of pt's disease  CLINICAL DECISION MAKING: Evolving/moderate complexity  EVALUATION COMPLEXITY: Moderate  PLAN:  PT FREQUENCY: 1x/week  PT DURATION: 8 weeks  PLANNED INTERVENTIONS: Therapeutic exercises, Therapeutic activity, Neuromuscular re-education, Balance training, Gait training, Patient/Family education, Self Care, Joint mobilization, Stair training, DME instructions, Manual therapy, and Re-evaluation  PLAN FOR NEXT SESSION: Continue practicing with some obstacles (especially on R side). Add to HEP as needed. Work on sit <> stands, strength, turns. Stair training (pt has step to get into house). Scifit for endurance, LE coordination, she really likes scrabble   Cruzita Lederer Angelissa Supan, PT, DPT 01/27/2023, 11:50 AM

## 2023-01-27 NOTE — Telephone Encounter (Signed)
Agree with Seroquel.

## 2023-01-27 NOTE — Addendum Note (Signed)
Addended byAlric Ran on: 01/27/2023 04:25 PM   Modules accepted: Orders

## 2023-01-27 NOTE — Telephone Encounter (Signed)
What directions, qty/ refills do you want for the seroqeul?

## 2023-01-27 NOTE — Telephone Encounter (Signed)
Dr. April Manson, are you able to review? Dr. Felecia Shelling out today and you are work in this afternoon

## 2023-01-27 NOTE — Telephone Encounter (Signed)
Pt's daughter Elyn Peers (on DPR)mother not getting sleep, waking up every 11/2 to 2 hours. When she is sleeping she kicking, pushing and hits husband and wakes him up. Would like a call from the nurse to discuss adding Seroquel.12.5 that may help.

## 2023-01-27 NOTE — Telephone Encounter (Signed)
1/2 tablet nightly, can increase to full tablet 25 as tolerated. I already send the Rx.

## 2023-01-28 ENCOUNTER — Other Ambulatory Visit: Payer: Self-pay

## 2023-01-28 ENCOUNTER — Telehealth: Payer: Self-pay

## 2023-01-28 MED ORDER — OLANZAPINE 2.5 MG PO TABS: 2.5000 mg | ORAL_TABLET | Freq: Every day | ORAL | 5 refills | Status: DC

## 2023-01-28 NOTE — Telephone Encounter (Signed)
Called the Pharmacy and was informed that pt's medication won't be covered due to her Insurance needing a CMS Notice of [place] Rights.

## 2023-01-28 NOTE — Telephone Encounter (Signed)
Daughter called stating that the insurance is not covering the Seroquel. She would like this to be appealed. Please advise.

## 2023-01-31 ENCOUNTER — Other Ambulatory Visit: Payer: Self-pay | Admitting: Cardiovascular Disease

## 2023-01-31 DIAGNOSIS — I10 Essential (primary) hypertension: Secondary | ICD-10-CM

## 2023-02-02 ENCOUNTER — Other Ambulatory Visit: Payer: Self-pay | Admitting: *Deleted

## 2023-02-02 DIAGNOSIS — I10 Essential (primary) hypertension: Secondary | ICD-10-CM

## 2023-02-02 DIAGNOSIS — I25118 Atherosclerotic heart disease of native coronary artery with other forms of angina pectoris: Secondary | ICD-10-CM

## 2023-02-02 MED ORDER — METOPROLOL SUCCINATE ER 50 MG PO TB24: ORAL_TABLET | ORAL | 3 refills | Status: AC

## 2023-02-03 ENCOUNTER — Encounter: Payer: Self-pay | Admitting: Physical Therapy

## 2023-02-03 ENCOUNTER — Ambulatory Visit: Payer: Medicare PPO | Admitting: Physical Therapy

## 2023-02-03 DIAGNOSIS — R2681 Unsteadiness on feet: Secondary | ICD-10-CM

## 2023-02-03 DIAGNOSIS — R293 Abnormal posture: Secondary | ICD-10-CM

## 2023-02-03 DIAGNOSIS — R2689 Other abnormalities of gait and mobility: Secondary | ICD-10-CM

## 2023-02-03 DIAGNOSIS — M6281 Muscle weakness (generalized): Secondary | ICD-10-CM

## 2023-02-03 NOTE — Therapy (Signed)
OUTPATIENT PHYSICAL THERAPY NEURO TREATMENT    Patient Name: Kristine [name] MRN: [id number] DOB:August 20, 1941, 82 y.o., female Today's Date: 02/03/2023   PCP: Kristine Lei, MD REFERRING PROVIDER: Courtney Heys, MD  END OF SESSION:  PT End of Session - 02/03/23 1109     Visit Number 6    Number of Visits 9   Plus eval   Date for PT Re-Evaluation 02/24/23    Authorization Type [employer] Medicare    PT Start Time 1102    PT Stop Time 1145    PT Time Calculation (min) 43 min    Equipment Utilized During Treatment Gait belt    Activity Tolerance Patient tolerated treatment well    Behavior During Therapy WFL for tasks assessed/performed              Past Medical History:  Diagnosis Date   COPD (chronic obstructive pulmonary disease) ([place])    Hard of hearing    History of anemia    History of blood transfusion    History of pneumonia    HTN (hypertension)    Hyperlipidemia    Hyperthyroidism    treated wtih radioactive iodine   OA (osteoarthritis of spine)    PVD (peripheral vascular disease) ([place])    Lower extremity Dopplers  May 2revealed an ABI of 0.83 in the right posterior    Shortness of breath dyspnea    STEMI (ST elevation myocardial infarction) [facility]) May 2011   Promus DES in the proximal, mid and distal RCA in May 2011   Stroke [facility])    Urinary frequency    Urinary urgency    Past Surgical History:  Procedure Laterality Date   ABDOMINAL HYSTERECTOMY     Had ovarian resection and required lysis of adhesions   CARDIAC CATHETERIZATION  04/07/2010   EF 60-70%   CORONARY STENTS     x3   ESOPHAGOGASTRODUODENOSCOPY N/A 03/10/2015   Procedure: ESOPHAGOGASTRODUODENOSCOPY (EGD);  Surgeon: Kristine Sit, MD;  Location: [place];  Service: Endoscopy;  Laterality: N/A;   OVARY SURGERY     TOTAL HIP ARTHROPLASTY  2004   left   TOTAL HIP ARTHROPLASTY Right 01/23/2016   Procedure: RIGHT TOTAL HIP ARTHROPLASTY ANTERIOR APPROACH;  Surgeon: Kristine Lights, MD;  Location: [place];  Service: Orthopedics;  Laterality: Right;   Patient Active Problem List   Diagnosis Date Noted   Lewy body dementia without behavioral disturbance, psychotic disturbance, mood disturbance, or anxiety ([place]) 06/02/2022   Acute memory impairment 05/16/2022   Impaired functional mobility, balance, and endurance 05/16/2022   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 02/05/2022   Lumbar radiculopathy 02/05/2022   Chest pain 05/14/2021   Somniloquy 04/08/2021   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   Snoring 12/19/2020   Excessive daytime sleepiness 12/19/2020   Gait disturbance 12/19/2020   Pain due to onychomycosis of toenails of both feet 05/25/2019   Porokeratosis 05/25/2019   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   Angioedema 05/31/2015   Fever 03/11/2015   Acute blood loss anemia 03/11/2015   Melena 03/10/2015   GI bleed 03/09/2015   Peripheral arterial disease ([place]) 03/01/2012   CAD (coronary artery disease) 01/20/2012   HTN (hypertension)    Hyperlipidemia    PVD (peripheral vascular disease) ([facility])    COPD (chronic obstructive pulmonary disease) ([place])    Hyperthyroidism    OA (osteoarthritis of spine)    STEMI (ST elevation myocardial infarction) ([place]) 04/07/2010  ONSET DATE: 12/17/2022  REFERRING DIAG: [id number] (ICD-10-CM) - Lewy body dementia without behavioral disturbance, psychotic disturbance, mood disturbance, or anxiety, unspecified dementia severity (HCC) R29.6 (ICD-10-CM) - Frequent falls Z74.09 (ICD-10-CM) - Impaired functional mobility, balance, and endurance  THERAPY DIAG:  Other abnormalities of gait and mobility  Unsteadiness on feet  Muscle weakness (generalized)  Abnormal posture  Rationale for Evaluation and Treatment: Rehabilitation  SUBJECTIVE:                                                                                                                                                                                              SUBJECTIVE STATEMENT: Pt ambulated into clinic w/RW. No falls. Per pt's husband, pt has not been using walker consistently at home, has been holding onto cane.   Pt accompanied by:  Husband, Kristine [name], Kristine [name].   PERTINENT HISTORY: L THR in 2017; asthma, COPD; CAD s/p stents; hx of stroke- no weakness; HTN, and HLD; chronic [name] back pain  PAIN:  Are you having pain? Reports stomach has been hurting a little.   PRECAUTIONS: Fall  WEIGHT BEARING RESTRICTIONS: No  FALLS: Has patient fallen in last 6 months? Yes. Number of falls 1  LIVING ENVIRONMENT: Lives with: lives with their spouse Lives in: House/apartment Stairs: Yes: Internal: full flight (they do not use these) steps; can reach both and External: 1 steps; none Has following equipment at home: Single point cane  PLOF: Needs assistance with ADLs, Needs assistance with homemaking, Needs assistance with gait, and Needs assistance with transfers  PATIENT GOALS: "I think balance"  OBJECTIVE:   COGNITION: Overall cognitive status: Impaired and History of cognitive impairments - at baseline    TODAY'S TREATMENT:         NMR  With pt standing in // bars on air ex with feet apart > feet together for balance challenge with pt performing ball toss with resident observer, performed 20 ball tosses, with pt demonstrating good catches and throws. Pt needing min guard at times for balance. Pt really enjoying this activity.    Gait pattern: step to pattern, decreased step length- Right, decreased step length- Left, decreased stride length, decreased hip/knee flexion- Right, decreased hip/knee flexion- Left, decreased ankle dorsiflexion- Right, decreased ankle dorsiflexion- Left, shuffling, and trunk flexed Distance walked: Various clinic distances and over threshold to imitate house environment  Assistive device utilized: Walker - 2 wheeled Level of assistance:  SBA Comments:Pt continues to require mod-max cues to maintain safe distance to RW w/turns and to fully turn prior to sitting. Pt taking incr time when turning today. Needing reminders to bring RW with  pt instead of "parking it" with min A from therapist to make sure that pt fully turns with RW before sitting down on mat table or SciFit   With [name] <> stands during session, pt needs cues for scooting out towards edge and proper UE placement (pt with tendency to reach out towards RW and try to pull up), and incr forward lean.    Ther Ex  SciFit level 2 for 8 minutes using BUE/BLEs for neural priming for reciprocal movement, dynamic cardiovascular conditioning and increased amplitude of stepping                                                                                           PATIENT EDUCATION: Education details: Continue HEP and use of RW in house, discussed possibly getting a care aide in the home to take off some caregiver burden from pt's spouse. Pt's spouse reports that they can't afford to get one  Person educated: Patient and Spouse Education method: Explanation, Demonstration, Verbal cues, and Handouts Education comprehension: verbalized understanding and needs further education  HOME EXERCISE PROGRAM: Access Code: [email or web address] URL: [email or web address] Date: 12/29/2022 Prepared by: Janann August  Exercises - [name] to Stand with Armchair  - 1-2 x daily - 4-5 x weekly - 1 sets - 10 reps - Standing March with Counter Support  - 1-2 x daily - 4-5 x weekly - 1-2 sets - 10 reps - Side Stepping with Counter Support  - 1-2 x daily - 4-5 x weekly - 3 sets - cues for taking bigger steps and picking up feet   Seated PWR Up                                                       GOALS: Goals reviewed with patient? Yes  SHORT TERM GOALS: Target date: 01/20/2023  Pt will perform initial HEP with min-mod A from husband for improved strength, balance, transfers and  gait.  Baseline:  Goal status: MET  2.  Pt will improve gait velocity to at least 0.9 ft/s with LRAD and CGA for improved gait efficiency   Baseline: 0.72 ft/s with SPC and CGA; 1.14 ft/s w/SPC Goal status: MET  3.  Pt and husband will verbalize fall prevention techniques in the home and community for improved safety w/mobility.  Baseline:  Goal status: IN PROGRESS  LONG TERM GOALS: Target date: 02/17/2023   Pt will perform final HEP with min-mod A from husband for improved strength, balance, transfers and gait. Baseline:  Goal status: INITIAL  2.  Pt will improve gait velocity to at least 1.2 ft/s w/LRAD and S* for improved gait efficiency   Baseline: 0.72 ft/s w/SPC and CGA Goal status: INITIAL  3.  Pt will improve 5 x STS to less than or equal to 30 seconds w/BUE support to demonstrate improved functional strength and transfer efficiency.   Baseline: 37.07s w/BUE support and significant retropulsion  Goal status: INITIAL  ASSESSMENT:  CLINICAL IMPRESSION:  Today's  skilled session continued to focus on working on turning with RW, [name] <> stands, SciFit for activity tolerance/strengthening, and standing ball toss for anticipatory balance. Pt continues to need mod-max cues to keep RW with pt when going to turn and [name] down to mat table. Pt with tendency to want to leave RW behind instead of taking it with pt to turn. With [name] <> stands, pt needs verbal/manual cues for proper set up, pt taking incr time to stand today. Will continue per POC.    OBJECTIVE IMPAIRMENTS: Abnormal gait, decreased activity tolerance, decreased balance, decreased cognition, decreased coordination, decreased endurance, decreased knowledge of condition, decreased knowledge of use of DME, decreased mobility, difficulty walking, decreased strength, decreased safety awareness, impaired perceived functional ability, improper body mechanics, and pain   ACTIVITY LIMITATIONS: carrying, lifting, standing,  squatting, sleeping, stairs, transfers, bathing, toileting, dressing, reach over head, hygiene/grooming, locomotion level, and caring for others  PARTICIPATION LIMITATIONS: meal prep, cleaning, laundry, medication management, personal finances, interpersonal relationship, driving, shopping, community activity, and yard work  PERSONAL FACTORS: Age, Behavior pattern, Fitness, Past/current experiences, and 1 comorbidity: Lewy Body Dementia  are also affecting patient's functional outcome.   REHAB POTENTIAL: Fair due to prognosis of pt's disease  CLINICAL DECISION MAKING: Evolving/moderate complexity  EVALUATION COMPLEXITY: Moderate  PLAN:  PT FREQUENCY: 1x/week  PT DURATION: 8 weeks  PLANNED INTERVENTIONS: Therapeutic exercises, Therapeutic activity, Neuromuscular re-education, Balance training, Gait training, Patient/Family education, Self Care, Joint mobilization, Stair training, DME instructions, Manual therapy, and Re-evaluation  PLAN FOR NEXT SESSION: Continue practicing with some obstacles (especially on R side). Add to HEP as needed. Work on [name] <> stands, strength, turns. Stair training (pt has step to get into house). Scifit for endurance, LE coordination, she really likes scrabble   Arliss Journey, PT, DPT 02/03/2023, 12:53 PM

## 2023-02-09 NOTE — Telephone Encounter (Signed)
PA Seroquel '25mg'$  approved: "PA Case: [id number], Status: Approved, Coverage Starts on: 12/08/2022 12:00:00 AM, Coverage Ends on: 12/08/2023 12:00:00 AM. Questions? Contact [phone]."  Please call daughter back and let her know we were able to get the Seroquel approved through insurance. They should now be able to pick this up from the pharmacy for mother to try

## 2023-02-09 NOTE — Telephone Encounter (Addendum)
PA Quetiapine '25mg'$  submitted on covermymeds. Key: [id number]. Waiting on determination from [facility].

## 2023-02-09 NOTE — Telephone Encounter (Signed)
Pt daughter called to reschedule pt appointment for another day but  Dr. Felecia Shelling next available is 8/1. Stated she will keep appointment for 3/19 and then she informed me the pharmacy called and told her Seroquel approved through insurance.

## 2023-02-09 NOTE — Telephone Encounter (Signed)
Pt's daughter, Elyn Peers nothing working;she is not sleeping being up every 2 hours. No one is able to get any sleep. By the time husband gets back to sleep pt is up again. Would like a call from the nurse to discuss what to do.

## 2023-02-10 ENCOUNTER — Ambulatory Visit: Payer: Medicare PPO | Attending: Physical Medicine and Rehabilitation | Admitting: Physical Therapy

## 2023-02-10 ENCOUNTER — Other Ambulatory Visit: Payer: Self-pay | Admitting: *Deleted

## 2023-02-10 DIAGNOSIS — M6281 Muscle weakness (generalized): Secondary | ICD-10-CM | POA: Diagnosis not present

## 2023-02-10 DIAGNOSIS — R2681 Unsteadiness on feet: Secondary | ICD-10-CM | POA: Diagnosis not present

## 2023-02-10 DIAGNOSIS — R2689 Other abnormalities of gait and mobility: Secondary | ICD-10-CM | POA: Diagnosis not present

## 2023-02-10 NOTE — Telephone Encounter (Signed)
Dr. Felecia Shelling- any recommendations?

## 2023-02-10 NOTE — Therapy (Signed)
OUTPATIENT PHYSICAL THERAPY NEURO TREATMENT    Patient Name: Kristine [name] MRN: [id number] DOB:[birth year], 82 y.o., female Today's Date: 02/10/2023   PCP: Lucianne Lei, MD REFERRING PROVIDER: Courtney Heys, MD  END OF SESSION:  PT End of Session - 02/10/23 1110     Visit Number 7    Number of Visits 9   Plus eval   Date for PT Re-Evaluation 02/24/23    Authorization Type [employer]    PT Start Time 1106   Therapist running late   PT Stop Time 71    PT Time Calculation (min) 41 min    Equipment Utilized During Treatment Gait belt    Activity Tolerance Patient tolerated treatment well    Behavior During Therapy WFL for tasks assessed/performed              Past Medical History:  Diagnosis Date   COPD (chronic obstructive pulmonary disease) ([place])    Hard of hearing    History of anemia    History of blood transfusion    History of pneumonia    HTN (hypertension)    Hyperlipidemia    Hyperthyroidism    treated wtih radioactive iodine   OA (osteoarthritis of spine)    PVD (peripheral vascular disease) ([place])    Lower extremity Dopplers  May 2revealed an ABI of 0.83 in the right posterior    Shortness of breath dyspnea    STEMI (ST elevation myocardial infarction) [facility]) May 2011   Promus DES in the proximal, mid and distal RCA in May 2011   Stroke [facility])    Urinary frequency    Urinary urgency    Past Surgical History:  Procedure Laterality Date   ABDOMINAL HYSTERECTOMY     Had ovarian resection and required lysis of adhesions   CARDIAC CATHETERIZATION  04/07/2010   EF 60-70%   CORONARY STENTS     x3   ESOPHAGOGASTRODUODENOSCOPY N/A 03/10/2015   Procedure: ESOPHAGOGASTRODUODENOSCOPY (EGD);  Surgeon: Acquanetta Sit, MD;  Location: [place];  Service: Endoscopy;  Laterality: N/A;   OVARY SURGERY     TOTAL HIP ARTHROPLASTY  2004   left   TOTAL HIP ARTHROPLASTY Right 01/23/2016   Procedure: RIGHT TOTAL HIP ARTHROPLASTY ANTERIOR APPROACH;  Surgeon: Ninetta Lights, MD;  Location: [place];  Service: Orthopedics;  Laterality: Right;   Patient Active Problem List   Diagnosis Date Noted   Lewy body dementia without behavioral disturbance, psychotic disturbance, mood disturbance, or anxiety ([place]) 06/02/2022   Acute memory impairment 05/16/2022   Impaired functional mobility, balance, and endurance 05/16/2022   Chronic left-sided low back pain with left-sided sciatica 02/05/2022   Lumbar radiculopathy 02/05/2022   Chest pain 05/14/2021   Somniloquy 04/08/2021   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   Snoring 12/19/2020   Excessive daytime sleepiness 12/19/2020   Gait disturbance 12/19/2020   Pain due to onychomycosis of toenails of both feet 05/25/2019   Porokeratosis 05/25/2019   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   Angioedema 05/31/2015   Fever 03/11/2015   Acute blood loss anemia 03/11/2015   Melena 03/10/2015   GI bleed 03/09/2015   Peripheral arterial disease ([place]) 03/01/2012   CAD (coronary artery disease) 01/20/2012   HTN (hypertension)    Hyperlipidemia    PVD (peripheral vascular disease) ([facility])    COPD (chronic obstructive pulmonary disease) ([place])    Hyperthyroidism    OA (osteoarthritis of spine)    STEMI (ST elevation myocardial  infarction) ([place]) 04/07/2010    ONSET DATE: 12/17/2022  REFERRING DIAG: [id number] (ICD-10-CM) - Lewy body dementia without behavioral disturbance, psychotic disturbance, mood disturbance, or anxiety, unspecified dementia severity (H[facility]) R29.6 (ICD-10-CM) - Frequent falls Z74.09 (ICD-10-CM) - Impaired functional mobility, balance, and endurance  THERAPY DIAG:  Other abnormalities of gait and mobility  Unsteadiness on feet  Muscle weakness (generalized)  Rationale for Evaluation and Treatment: Rehabilitation  SUBJECTIVE:                                                                                                                                                                                              SUBJECTIVE STATEMENT: Pt ambulated into clinic w/RW. No falls. Per husband, pt still not sleeping well. Has an appointment w/Dr. Criss Rosales next Monday to discuss in-home care options.   Pt accompanied by:  Husband, [name], [name].   PERTINENT HISTORY: L THR in 2017; asthma, COPD; CAD s/p stents; hx of stroke- no weakness; HTN, and HLD; chronic low back pain  PAIN:  Are you having pain? Reports stomach has been hurting a little.   PRECAUTIONS: Fall  WEIGHT BEARING RESTRICTIONS: No  FALLS: Has patient fallen in last 6 months? Yes. Number of falls 1  LIVING ENVIRONMENT: Lives with: lives with their spouse Lives in: House/apartment Stairs: Yes: Internal: full flight (they do not use these) steps; can reach both and External: 1 steps; none Has following equipment at home: Single point cane  PLOF: Needs assistance with ADLs, Needs assistance with homemaking, Needs assistance with gait, and Needs assistance with transfers  PATIENT GOALS: "I think balance"  OBJECTIVE:   COGNITION: Overall cognitive status: Impaired and History of cognitive impairments - at baseline    TODAY'S TREATMENT:         Self-care/home management  Discussed benefit for obtaining care aide for home. Husband reports they have appointment w/Dr. Criss Rosales on Monday to fill out paperwork but he is unsure which paperwork it is. He does think it is for home assistance. Plan to provide contact with [place] next week if appointment with Dr. Criss Rosales is regarding something else. Pt's husband verbalized understanding.   Ther Ex  SciFit multi-peaks level 2 for 8 minutes using BUE/BLEs for neural priming for reciprocal movement, dynamic cardiovascular warmup and increased amplitude of stepping.   NMR  Mass practice of sit <>stands to work on improved transfers and safety. Had pt perform 5 sit <>stands without cues and pt demonstrated  different technique each rep, but was able to perform without UE support on reps 4 & 5. Provided mod verbal cues for scooting hip towards edge  of seat and to "push, not pull" with hands. Pt performed 3 reps w/concurrent cues well and progressed to 5 reps without cues. Pt frequently forgets where to place her hands, but if given "push" cue, can perform well. Encouraged pt and husband to continue practicing at home and not pull pt up to stand. Pt and husband verbalized understanding.     Gait pattern: step to pattern, decreased step length- Right, decreased step length- Left, decreased stride length, decreased hip/knee flexion- Right, decreased hip/knee flexion- Left, decreased ankle dorsiflexion- Right, decreased ankle dorsiflexion- Left, shuffling, and trunk flexed Distance walked: Various clinic distances  Assistive device utilized: Walker - 2 wheeled Level of assistance: SBA Comments:Pt continues to require mod-max cues to maintain safe distance to RW w/turns and to fully turn prior to sitting. Pt taking incr time when turning today. Needing reminders to bring RW with pt instead of "parking it" with min A from therapist to make sure that pt fully turns with RW before sitting down on mat table or SciFit                                                                                           PATIENT EDUCATION: Education details: Continue HEP and use of RW in house, potential to provide info for SW to find care aide for home Person educated: Patient and Spouse Education method: Explanation, Media planner, Verbal cues, and Handouts Education comprehension: verbalized understanding and needs further education  HOME EXERCISE PROGRAM: Access Code: [email or web address] URL: [email or web address] Date: 12/29/2022 Prepared by: Janann August  Exercises - Sit to Stand with Armchair  - 1-2 x daily - 4-5 x weekly - 1 sets - 10 reps - Standing March with Counter Support  - 1-2 x daily - 4-5 x  weekly - 1-2 sets - 10 reps - Side Stepping with Counter Support  - 1-2 x daily - 4-5 x weekly - 3 sets - cues for taking bigger steps and picking up feet   Seated PWR Up                                                       GOALS: Goals reviewed with patient? Yes  SHORT TERM GOALS: Target date: 01/20/2023  Pt will perform initial HEP with min-mod A from husband for improved strength, balance, transfers and gait.  Baseline:  Goal status: MET  2.  Pt will improve gait velocity to at least 0.9 ft/s with LRAD and CGA for improved gait efficiency   Baseline: 0.72 ft/s with SPC and CGA; 1.14 ft/s w/SPC Goal status: MET  3.  Pt and husband will verbalize fall prevention techniques in the home and community for improved safety w/mobility.  Baseline:  Goal status: IN PROGRESS  LONG TERM GOALS: Target date: 02/17/2023   Pt will perform final HEP with min-mod A from husband for improved strength, balance, transfers and gait. Baseline:  Goal status: INITIAL  2.  Pt will improve gait velocity to at  least 1.2 ft/s w/LRAD and S* for improved gait efficiency   Baseline: 0.72 ft/s w/SPC and CGA Goal status: INITIAL  3.  Pt will improve 5 x STS to less than or equal to 30 seconds w/BUE support to demonstrate improved functional strength and transfer efficiency.   Baseline: 37.07s w/BUE support and significant retropulsion  Goal status: INITIAL  ASSESSMENT:  CLINICAL IMPRESSION:  Emphasis of skilled PT session on endurance, transfers and safe use of RW. Pt continues to require mod-max verbal cues for safe management of RW, especially with turns, as she tends to push it away from her. Pt also requires cues for proper sequencing of sit <>stands, as she frequently attempts to pull herself up with RW. Pt did well w/cue to "push", so encouraged pt to try this at home. Continue POC.    OBJECTIVE IMPAIRMENTS: Abnormal gait, decreased activity tolerance, decreased balance, decreased cognition,  decreased coordination, decreased endurance, decreased knowledge of condition, decreased knowledge of use of DME, decreased mobility, difficulty walking, decreased strength, decreased safety awareness, impaired perceived functional ability, improper body mechanics, and pain   ACTIVITY LIMITATIONS: carrying, lifting, standing, squatting, sleeping, stairs, transfers, bathing, toileting, dressing, reach over head, hygiene/grooming, locomotion level, and caring for others  PARTICIPATION LIMITATIONS: meal prep, cleaning, laundry, medication management, personal finances, interpersonal relationship, driving, shopping, community activity, and yard work  PERSONAL FACTORS: Age, Behavior pattern, Fitness, Past/current experiences, and 1 comorbidity: Lewy Body Dementia  are also affecting patient's functional outcome.   REHAB POTENTIAL: Fair due to prognosis of pt's disease  CLINICAL DECISION MAKING: Evolving/moderate complexity  EVALUATION COMPLEXITY: Moderate  PLAN:  PT FREQUENCY: 1x/week  PT DURATION: 8 weeks  PLANNED INTERVENTIONS: Therapeutic exercises, Therapeutic activity, Neuromuscular re-education, Balance training, Gait training, Patient/Family education, Self Care, Joint mobilization, Stair training, DME instructions, Manual therapy, and Re-evaluation  PLAN FOR NEXT SESSION: Check goals and recert. Continue practicing with some obstacles (especially on R side). Add to HEP as needed. Work on sit <> stands, strength, turns. Stair training (pt has step to get into house). Scifit for endurance, LE coordination, she really likes scrabble   Cruzita Lederer Guneet Delpino, PT, DPT 02/10/2023, 11:50 AM

## 2023-02-10 NOTE — Telephone Encounter (Signed)
Noted  

## 2023-02-10 NOTE — Telephone Encounter (Signed)
Pt daughter is calling stated the Seroquel can't be cut in half and they are crumbling everywhere. Pt is requesting a call back from nurse

## 2023-02-11 MED ORDER — DONEPEZIL HCL 10 MG PO TABS: ORAL_TABLET | ORAL | 1 refills | Status: AC

## 2023-02-11 NOTE — Telephone Encounter (Signed)
LVM for daughter to call office.

## 2023-02-11 NOTE — Telephone Encounter (Signed)
Dr. Felecia Shelling- any thoughts about her Seroquel?

## 2023-02-11 NOTE — Addendum Note (Signed)
Addended by: Wyvonnia Lora on: 02/11/2023 09:47 AM   Modules accepted: Orders

## 2023-02-11 NOTE — Telephone Encounter (Addendum)
Took call from phone staff and spoke with daughter, Kristine [name]. States they already cut all tablets of Seroquel in half. Some crumbled/powder. They put it all back in medicine bottle together. Advised I will have to discuss with Dr. Felecia Shelling to see what options they have. Insurance unlikely to cover for them to pick up another prescription if she just got it filled.   She expressed dissatisfaction that someone should not be advised to cut such a small pill in half. Advised in future, they should call if having difficulty cutting pill in half. Recommended they not cut all tablets if having difficulty like this. To call our office instead to discuss options. She states they were trying to make it easier on their father to give her the medication.  She also states no one notified her that medication approved. Advised it was just approved. Message sent to phone room to call her 2 days ago but she called prior to them being able to call her first.   Agreeable to increase donepezil dose to '10mg'$ . Rx called in.

## 2023-02-11 NOTE — Telephone Encounter (Addendum)
Called pt and spoke to pt husband to let her know that Dr. Felecia Shelling said for her to take 1 pill of the Seroquel per day. Pt's husband verbalized understanding.

## 2023-02-16 ENCOUNTER — Other Ambulatory Visit: Payer: Self-pay | Admitting: Neurology

## 2023-02-16 DIAGNOSIS — F01518 Vascular dementia, unspecified severity, with other behavioral disturbance: Secondary | ICD-10-CM | POA: Diagnosis not present

## 2023-02-16 DIAGNOSIS — I10 Essential (primary) hypertension: Secondary | ICD-10-CM | POA: Diagnosis not present

## 2023-02-17 ENCOUNTER — Ambulatory Visit: Payer: Medicare PPO | Admitting: Physical Therapy

## 2023-02-17 DIAGNOSIS — R2681 Unsteadiness on feet: Secondary | ICD-10-CM

## 2023-02-17 DIAGNOSIS — M6281 Muscle weakness (generalized): Secondary | ICD-10-CM | POA: Diagnosis not present

## 2023-02-17 DIAGNOSIS — R2689 Other abnormalities of gait and mobility: Secondary | ICD-10-CM

## 2023-02-17 NOTE — Therapy (Signed)
OUTPATIENT PHYSICAL THERAPY NEURO TREATMENT    Patient Name: ZAIDA WILDBERGER MRN: [id number] DOB:1941/09/30, 82 y.o., female Today's Date: 02/17/2023   PCP: Lucianne Lei, MD REFERRING PROVIDER: Courtney Heys, MD  END OF SESSION:  PT End of Session - 02/17/23 1239     Visit Number 8    Number of Visits 9   Plus eval   Date for PT Re-Evaluation 02/24/23    Authorization Type [employer]    PT Start Time 1232    PT Stop Time 1319    PT Time Calculation (min) 47 min    Equipment Utilized During Treatment Gait belt    Activity Tolerance Patient tolerated treatment well    Behavior During Therapy WFL for tasks assessed/performed              Past Medical History:  Diagnosis Date   COPD (chronic obstructive pulmonary disease) ([place])    Hard of hearing    History of anemia    History of blood transfusion    History of pneumonia    HTN (hypertension)    Hyperlipidemia    Hyperthyroidism    treated wtih radioactive iodine   OA (osteoarthritis of spine)    PVD (peripheral vascular disease) ([place])    Lower extremity Dopplers  May 2revealed an ABI of 0.83 in the right posterior    Shortness of breath dyspnea    STEMI (ST elevation myocardial infarction) [facility]) May 2011   Promus DES in the proximal, mid and distal RCA in May 2011   Stroke [facility])    Urinary frequency    Urinary urgency    Past Surgical History:  Procedure Laterality Date   ABDOMINAL HYSTERECTOMY     Had ovarian resection and required lysis of adhesions   CARDIAC CATHETERIZATION  04/07/2010   EF 60-70%   CORONARY STENTS     x3   ESOPHAGOGASTRODUODENOSCOPY N/A 03/10/2015   Procedure: ESOPHAGOGASTRODUODENOSCOPY (EGD);  Surgeon: Acquanetta Sit, MD;  Location: [place];  Service: Endoscopy;  Laterality: N/A;   OVARY SURGERY     TOTAL HIP ARTHROPLASTY  2004   left   TOTAL HIP ARTHROPLASTY Right 01/23/2016   Procedure: RIGHT TOTAL HIP ARTHROPLASTY ANTERIOR APPROACH;  Surgeon: Ninetta Lights, MD;  Location: [place];  Service: Orthopedics;  Laterality: Right;   Patient Active Problem List   Diagnosis Date Noted   Lewy body dementia without behavioral disturbance, psychotic disturbance, mood disturbance, or anxiety ([place]) 06/02/2022   Acute memory impairment 05/16/2022   Impaired functional mobility, balance, and endurance 05/16/2022   Chronic left-sided low back pain with left-sided sciatica 02/05/2022   Lumbar radiculopathy 02/05/2022   Chest pain 05/14/2021   Somniloquy 04/08/2021   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   Snoring 12/19/2020   Excessive daytime sleepiness 12/19/2020   Gait disturbance 12/19/2020   Pain due to onychomycosis of toenails of both feet 05/25/2019   Porokeratosis 05/25/2019   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   Angioedema 05/31/2015   Fever 03/11/2015   Acute blood loss anemia 03/11/2015   Melena 03/10/2015   GI bleed 03/09/2015   Peripheral arterial disease ([place]) 03/01/2012   CAD (coronary artery disease) 01/20/2012   HTN (hypertension)    Hyperlipidemia    PVD (peripheral vascular disease) ([facility])    COPD (chronic obstructive pulmonary disease) ([place])    Hyperthyroidism    OA (osteoarthritis of spine)    STEMI (ST elevation myocardial infarction) ([place]) 04/07/2010  ONSET DATE: 12/17/2022  REFERRING DIAG: [id number] (ICD-10-CM) - Lewy body dementia without behavioral disturbance, psychotic disturbance, mood disturbance, or anxiety, unspecified dementia severity (HCC) R29.6 (ICD-10-CM) - Frequent falls Z74.09 (ICD-10-CM) - Impaired functional mobility, balance, and endurance  THERAPY DIAG:  Other abnormalities of gait and mobility  Unsteadiness on feet  Rationale for Evaluation and Treatment: Rehabilitation  SUBJECTIVE:                                                                                                                                                                                              SUBJECTIVE STATEMENT: Pt ambulated slowly into clinic w/RW. Required cues for proper AD management. Pt denies acute changes, states "nothing is sticking today".   Pt accompanied by:  Husband, [name], [name]. And daughter   PERTINENT HISTORY: L THR in 2017; asthma, COPD; CAD s/p stents; hx of stroke- no weakness; HTN, and HLD; chronic low back pain  PAIN:  Are you having pain? Reports stomach has been hurting a little.   PRECAUTIONS: Fall  WEIGHT BEARING RESTRICTIONS: No  FALLS: Has patient fallen in last 6 months? Yes. Number of falls 1  LIVING ENVIRONMENT: Lives with: lives with their spouse Lives in: House/apartment Stairs: Yes: Internal: full flight (they do not use these) steps; can reach both and External: 1 steps; none Has following equipment at home: Single point cane  PLOF: Needs assistance with ADLs, Needs assistance with homemaking, Needs assistance with gait, and Needs assistance with transfers  PATIENT GOALS: "I think balance"  OBJECTIVE:   COGNITION: Overall cognitive status: Impaired and History of cognitive impairments - at baseline    TODAY'S TREATMENT:         Ther Ex  SciFit multi-peaks level 3.5 for 8 minutes using BUE/BLEs for neural priming for reciprocal movement, dynamic cardiovascular conditioning and increased amplitude of stepping.    Ther Act  LTG Assessment    [facility] PT Assessment - 02/17/23 1252       Ambulation/Gait   Gait velocity 32.8' over 56.18s = 0.58 ft/s w/RW            Discussed results of gait speed and POC moving forward. At this time, therapist recommending pt transition to [place] for improved safety in home environment, as pt demonstrates absent carryover of education/therapy provided in clinic to home environment. Pt's daughter verbalized understanding and reported pt exhibits increased step length/speed at home and. Will plan to message Dr. Dagoberto Ligas to request referral to [place].  Reviewed proper sit  <>stand transfers w/pt, but pt unable to recall proper hand placement or sequence without cues and frequently attempts  to pull up on RW.     Gait pattern: step to pattern, decreased step length- Right, decreased step length- Left, decreased stride length, decreased hip/knee flexion- Right, decreased hip/knee flexion- Left, decreased ankle dorsiflexion- Right, decreased ankle dorsiflexion- Left, shuffling, and trunk flexed Distance walked: Various clinic distances  Assistive device utilized: Walker - 2 wheeled Level of assistance: SBA Comments: Pt demonstrating increased shuffling gait pattern today but unable to accept cues as this is too distracting. Pt continues to require mod-max cues to maintain safe distance to RW w/turns and to fully turn prior to sitting. Pt taking incr time when turning today. Needing reminders to bring RW with pt instead of "parking it" with min A from therapist to make sure that pt fully turns with RW before sitting down on mat table or SciFit.                                                                                           PATIENT EDUCATION: Education details: Goal assessment, plan to transition to [place]  Person educated: Patient, Spouse, and Child(ren) Education method: Explanation, Demonstration, and Verbal cues Education comprehension: verbalized understanding and needs further education  HOME EXERCISE PROGRAM: Access Code: [email or web address] URL: [email or web address] Date: 12/29/2022 Prepared by: Janann August  Exercises - Sit to Stand with Armchair  - 1-2 x daily - 4-5 x weekly - 1 sets - 10 reps - Standing March with Counter Support  - 1-2 x daily - 4-5 x weekly - 1-2 sets - 10 reps - Side Stepping with Counter Support  - 1-2 x daily - 4-5 x weekly - 3 sets - cues for taking bigger steps and picking up feet   Seated PWR Up                                                       GOALS: Goals reviewed with patient? Yes  SHORT TERM GOALS:  Target date: 01/20/2023  Pt will perform initial HEP with min-mod A from husband for improved strength, balance, transfers and gait.  Baseline:  Goal status: MET  2.  Pt will improve gait velocity to at least 0.9 ft/s with LRAD and CGA for improved gait efficiency   Baseline: 0.72 ft/s with SPC and CGA; 1.14 ft/s w/SPC Goal status: MET  3.  Pt and husband will verbalize fall prevention techniques in the home and community for improved safety w/mobility.  Baseline:  Goal status: IN PROGRESS  LONG TERM GOALS: Target date: 02/17/2023   Pt will perform final HEP with min-mod A from husband for improved strength, balance, transfers and gait. Baseline:  Goal status: INITIAL  2.  Pt will improve gait velocity to at least 1.2 ft/s w/LRAD and S* for improved gait efficiency   Baseline: 0.72 ft/s w/SPC and CGA; 0.58 ft/s with RW (3/12)  Goal status: NOT MET  3.  Pt will improve 5 x STS to less than or equal to 30 seconds w/BUE support to demonstrate  improved functional strength and transfer efficiency.   Baseline: 37.07s w/BUE support and significant retropulsion  Goal status: INITIAL  ASSESSMENT:  CLINICAL IMPRESSION: Emphasis of skilled PT session on LTG assessment. Pt has not met any goals this date, demonstrating significant decline in gait speed this date compared to 4 weeks ago. At this time, family in agreement that pt would benefit from HHPT to work on safety/transfers in home environment, as pt has absent carryover from OPPT and OP environment is very distracting for pt. ***    OBJECTIVE IMPAIRMENTS: Abnormal gait, decreased activity tolerance, decreased balance, decreased cognition, decreased coordination, decreased endurance, decreased knowledge of condition, decreased knowledge of use of DME, decreased mobility, difficulty walking, decreased strength, decreased safety awareness, impaired perceived functional ability, improper body mechanics, and pain   ACTIVITY LIMITATIONS:  carrying, lifting, standing, squatting, sleeping, stairs, transfers, bathing, toileting, dressing, reach over head, hygiene/grooming, locomotion level, and caring for others  PARTICIPATION LIMITATIONS: meal prep, cleaning, laundry, medication management, personal finances, interpersonal relationship, driving, shopping, community activity, and yard work  PERSONAL FACTORS: Age, Behavior pattern, Fitness, Past/current experiences, and 1 comorbidity: Lewy Body Dementia  are also affecting patient's functional outcome.   REHAB POTENTIAL: Fair due to prognosis of pt's disease  CLINICAL DECISION MAKING: Evolving/moderate complexity  EVALUATION COMPLEXITY: Moderate  PLAN:  PT FREQUENCY: 1x/week  PT DURATION: 8 weeks  PLANNED INTERVENTIONS: Therapeutic exercises, Therapeutic activity, Neuromuscular re-education, Balance training, Gait training, Patient/Family education, Self Care, Joint mobilization, Stair training, DME instructions, Manual therapy, and Re-evaluation  PLAN FOR NEXT SESSION: Check goals and recert. Continue practicing with some obstacles (especially on R side). Add to HEP as needed. Work on sit <> stands, strength, turns. Stair training (pt has step to get into house). Scifit for endurance, LE coordination, she really likes scrabble   Guiliana Shor E Kseniya Grunden, PT, DPT 02/17/2023, 1:20 PM

## 2023-02-18 ENCOUNTER — Telehealth: Payer: Self-pay | Admitting: Physical Therapy

## 2023-02-18 DIAGNOSIS — G3183 Dementia with Lewy bodies: Secondary | ICD-10-CM

## 2023-02-18 DIAGNOSIS — Z7409 Other reduced mobility: Secondary | ICD-10-CM

## 2023-02-18 DIAGNOSIS — G8929 Other chronic pain: Secondary | ICD-10-CM

## 2023-02-18 NOTE — Telephone Encounter (Signed)
Dr. Dagoberto Ligas,   I have been seeing Kristine [name] in [employer] since 12/23/22. Due to her diagnosis and significant cognitive decline, she is no longer benefiting from therapy in an outpatient environment and her family is in agreement to try to transition to [place].   I understand your most recent request for HHPT (sent in October 2023) was declined by insurance. Do you think insurance will cover it now due to pt's decline in cognition/function?    If you agree, please place an order in [facility]que in [facility] or fax the order to [phone].  Thank you, Francena Hanly, PT, [place] [address] [place],   [address] Phone:  [phone] Fax:  [phone]

## 2023-02-19 ENCOUNTER — Telehealth: Payer: Self-pay | Admitting: Neurology

## 2023-02-19 MED ORDER — QUETIAPINE FUMARATE 25 MG PO TABS: 25.0000 mg | ORAL_TABLET | Freq: Every day | ORAL | 5 refills | Status: DC

## 2023-02-19 NOTE — Telephone Encounter (Signed)
E-scribed refill 

## 2023-02-19 NOTE — Telephone Encounter (Signed)
Pt's daughter, Elyn Peers pharmacy said could not fill the prescription for  QUEtiapine (SEROQUEL) 25 MG tablet until March 16. Was original Filled for 15 tablets 25 mg was suppose to be split in half but that was not possible due to tablet so small. At your office instructed to take whole tablet.  Pt is out of medication, would like a call from the nurse

## 2023-02-19 NOTE — Telephone Encounter (Signed)
I called and spoke with pharmacy and was told that pt picked up #15 tablets on 02/09/23 and insurance will not pay until 02/21/23. I called Bethena Roys (daughter) and explained this to her. Bethena Roys said her dad is giving patient her medication, pt has been taking a full tablet and not 1/2 because it was too small to cut in half.    Daughter verbalized she understood.

## 2023-02-19 NOTE — Telephone Encounter (Signed)
Pt's daughter, Elyn Peers request refill for QUEtiapine (SEROQUEL) 25 MG tablet at  [facility]

## 2023-02-23 ENCOUNTER — Ambulatory Visit: Payer: Medicare PPO | Admitting: Physical Therapy

## 2023-02-23 DIAGNOSIS — R2689 Other abnormalities of gait and mobility: Secondary | ICD-10-CM | POA: Diagnosis not present

## 2023-02-23 DIAGNOSIS — R2681 Unsteadiness on feet: Secondary | ICD-10-CM | POA: Diagnosis not present

## 2023-02-23 DIAGNOSIS — M6281 Muscle weakness (generalized): Secondary | ICD-10-CM | POA: Diagnosis not present

## 2023-02-23 NOTE — Therapy (Signed)
OUTPATIENT PHYSICAL THERAPY NEURO TREATMENT - DISCHARGE SUMMARY   Patient Name: Kristine [name] MRN: [id number] DOB:[birth year], 82 y.o., female Today's Date: 02/23/2023   PCP: Lucianne Lei, MD REFERRING PROVIDER: Courtney Heys, MD  PHYSICAL THERAPY DISCHARGE SUMMARY  Visits from Start of Care: 9  Current functional level related to goals / functional outcomes: Pt requires 24/7 supervision for safety w/functional mobility. Uses RW in community    Remaining deficits: Impairments 2/2 Lewy Body Dementia    Education / Equipment: HEP   Patient agrees to discharge. Patient goals were partially met. Patient is being discharged due to maximized rehab potential.    END OF SESSION:  PT End of Session - 02/23/23 1453     Visit Number 9    Number of Visits 9   Plus eval   Date for PT Re-Evaluation 02/24/23    Authorization Type [employer] Medicare    PT Start Time 1446    PT Stop Time 1529    PT Time Calculation (min) 43 min    Equipment Utilized During Treatment Gait belt    Activity Tolerance Patient tolerated treatment well    Behavior During Therapy WFL for tasks assessed/performed               Past Medical History:  Diagnosis Date   COPD (chronic obstructive pulmonary disease) ([place])    Hard of hearing    History of anemia    History of blood transfusion    History of pneumonia    HTN (hypertension)    Hyperlipidemia    Hyperthyroidism    treated wtih radioactive iodine   OA (osteoarthritis of spine)    PVD (peripheral vascular disease) ([place])    Lower extremity Dopplers  May 2revealed an ABI of 0.83 in the right posterior    Shortness of breath dyspnea    STEMI (ST elevation myocardial infarction) [facility]) May 2011   Promus DES in the proximal, mid and distal RCA in May 2011   Stroke [facility])    Urinary frequency    Urinary urgency    Past Surgical History:  Procedure Laterality Date   ABDOMINAL HYSTERECTOMY     Had ovarian resection and required lysis of  adhesions   CARDIAC CATHETERIZATION  04/07/2010   EF 60-70%   CORONARY STENTS     x3   ESOPHAGOGASTRODUODENOSCOPY N/A 03/10/2015   Procedure: ESOPHAGOGASTRODUODENOSCOPY (EGD);  Surgeon: Acquanetta Sit, MD;  Location: [place];  Service: Endoscopy;  Laterality: N/A;   OVARY SURGERY     TOTAL HIP ARTHROPLASTY  2004   left   TOTAL HIP ARTHROPLASTY Right 01/23/2016   Procedure: RIGHT TOTAL HIP ARTHROPLASTY ANTERIOR APPROACH;  Surgeon: Ninetta Lights, MD;  Location: [place];  Service: Orthopedics;  Laterality: Right;   Patient Active Problem List   Diagnosis Date Noted   Lewy body dementia without behavioral disturbance, psychotic disturbance, mood disturbance, or anxiety ([place]) 06/02/2022   Acute memory impairment 05/16/2022   Impaired functional mobility, balance, and endurance 05/16/2022   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 02/05/2022   Lumbar radiculopathy 02/05/2022   Chest pain 05/14/2021   Somniloquy 04/08/2021   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   Snoring 12/19/2020   Excessive daytime sleepiness 12/19/2020   Gait disturbance 12/19/2020   Pain due to onychomycosis of toenails of both feet 05/25/2019   Porokeratosis 05/25/2019   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   Angioedema 05/31/2015   Fever 03/11/2015  Acute blood loss anemia 03/11/2015   Melena 03/10/2015   GI bleed 03/09/2015   Peripheral arterial disease ([place]) 03/01/2012   CAD (coronary artery disease) 01/20/2012   HTN (hypertension)    Hyperlipidemia    PVD (peripheral vascular disease) ([facility])    COPD (chronic obstructive pulmonary disease) ([facility])    Hyperthyroidism    OA (osteoarthritis of spine)    STEMI (ST elevation myocardial infarction) ([place]) 04/07/2010    ONSET DATE: 12/17/2022  REFERRING DIAG: [id number] (ICD-10-CM) - Lewy body dementia without behavioral disturbance, psychotic disturbance, mood disturbance, or anxiety,  unspecified dementia severity (HCC) R29.6 (ICD-10-CM) - Frequent falls Z74.09 (ICD-10-CM) - Impaired functional mobility, balance, and endurance  THERAPY DIAG:  Unsteadiness on feet  Other abnormalities of gait and mobility  Rationale for Evaluation and Treatment: Rehabilitation  SUBJECTIVE:                                                                                                                                                                                             SUBJECTIVE STATEMENT: Pt ambulated slowly into clinic w/RW. Required cues for proper AD management. No acute changes.   Pt accompanied by:  Husband, Kristine [name], [name].    PERTINENT HISTORY: L THR in 2017; asthma, COPD; CAD s/p stents; hx of stroke- no weakness; HTN, and HLD; chronic [name] back pain  PAIN:  Are you having pain? Reports stomach has been hurting a little.   PRECAUTIONS: Fall  WEIGHT BEARING RESTRICTIONS: No  FALLS: Has patient fallen in last 6 months? Yes. Number of falls 1  LIVING ENVIRONMENT: Lives with: lives with their spouse Lives in: House/apartment Stairs: Yes: Internal: full flight (they do not use these) steps; can reach both and External: 1 steps; none Has following equipment at home: Single point cane  PLOF: Needs assistance with ADLs, Needs assistance with homemaking, Needs assistance with gait, and Needs assistance with transfers  PATIENT GOALS: "I think balance"  OBJECTIVE:   COGNITION: Overall cognitive status: Impaired and History of cognitive impairments - at baseline    TODAY'S TREATMENT:         Ther Act LTG Assessment    [facility] PT Assessment - 02/23/23 1505       Transfers   Five time sit to stand comments  30.65s   hands braced on knees and posterior lean against mat           Reviewed plan to DC from OPPT today to pursue HHPT, as Dr. Dagoberto Ligas has placed the order. Pt and husband verbalized understanding and agreement, as pt no longer benefiting from  OPPT.   Ther Ex  SciFit multi-peaks level 5 for 8 minutes using BUE/BLEs for neural priming for reciprocal movement, dynamic cardiovascular conditioning and increased amplitude of stepping. Min cues for proper hand placement to perform sit <>stand off SciFit.    Gait pattern: step to pattern, decreased step length- Right, decreased step length- Left, decreased stride length, decreased hip/knee flexion- Right, decreased hip/knee flexion- Left, decreased ankle dorsiflexion- Right, decreased ankle dorsiflexion- Left, shuffling, and trunk flexed Distance walked: Various clinic distances  Assistive device utilized: Walker - 2 wheeled Level of assistance: SBA Comments: Min cues required for safe AD management, especially with turns. Pt w/increased shuffling of gait and forward trunk lean this date.                                                                                           PATIENT EDUCATION: Education details: Goal outcomes, DC plan  Person educated: Patient, Spouse, and Child(ren) Education method: Explanation, Demonstration, and Verbal cues Education comprehension: verbalized understanding and needs further education  HOME EXERCISE PROGRAM: Access Code: [email or web address] URL: [email or web address] Date: 12/29/2022 Prepared by: Janann August  Exercises - Sit to Stand with Armchair  - 1-2 x daily - 4-5 x weekly - 1 sets - 10 reps - Standing March with Counter Support  - 1-2 x daily - 4-5 x weekly - 1-2 sets - 10 reps - Side Stepping with Counter Support  - 1-2 x daily - 4-5 x weekly - 3 sets - cues for taking bigger steps and picking up feet   Seated PWR Up                                                       GOALS: Goals reviewed with patient? Yes  SHORT TERM GOALS: Target date: 01/20/2023  Pt will perform initial HEP with min-mod A from husband for improved strength, balance, transfers and gait.  Baseline:  Goal status: MET  2.  Pt will improve gait  velocity to at least 0.9 ft/s with LRAD and CGA for improved gait efficiency   Baseline: 0.72 ft/s with SPC and CGA; 1.14 ft/s w/SPC Goal status: MET  3.  Pt and husband will verbalize fall prevention techniques in the home and community for improved safety w/mobility.  Baseline:  Goal status: IN PROGRESS  LONG TERM GOALS: Target date: 02/17/2023   Pt will perform final HEP with min-mod A from husband for improved strength, balance, transfers and gait. Baseline:  Goal status: MET  2.  Pt will improve gait velocity to at least 1.2 ft/s w/LRAD and S* for improved gait efficiency   Baseline: 0.72 ft/s w/SPC and CGA; 0.58 ft/s with RW (3/12)  Goal status: NOT MET  3.  Pt will improve 5 x STS to less than or equal to 30 seconds w/BUE support to demonstrate improved functional strength and transfer efficiency.   Baseline: 37.07s w/BUE support and significant retropulsion; 30.64s w/hands on knees and posterior lean on mat  Goal status: MET  ASSESSMENT:  CLINICAL IMPRESSION: Emphasis of skilled PT session on LTG assessment and DC from PT. Pt has met 2 of 3 LTGs, performing her HEP at home and meeting her 5x STS goal. Pt continues to require cues for safety with transfers but will benefit from practice in home environment, as pt has absent carryover in OP environment. PT and husband in agreement to DC this date to pursue HHPT.    OBJECTIVE IMPAIRMENTS: Abnormal gait, decreased activity tolerance, decreased balance, decreased cognition, decreased coordination, decreased endurance, decreased knowledge of condition, decreased knowledge of use of DME, decreased mobility, difficulty walking, decreased strength, decreased safety awareness, impaired perceived functional ability, improper body mechanics, and pain   ACTIVITY LIMITATIONS: carrying, lifting, standing, squatting, sleeping, stairs, transfers, bathing, toileting, dressing, reach over head, hygiene/grooming, locomotion level, and caring for  others  PARTICIPATION LIMITATIONS: meal prep, cleaning, laundry, medication management, personal finances, interpersonal relationship, driving, shopping, community activity, and yard work  PERSONAL FACTORS: Age, Behavior pattern, Fitness, Past/current experiences, and 1 comorbidity: Lewy Body Dementia  are also affecting patient's functional outcome.   REHAB POTENTIAL: Fair due to prognosis of pt's disease  CLINICAL DECISION MAKING: Evolving/moderate complexity  EVALUATION COMPLEXITY: Moderate  PLAN:  PT FREQUENCY: 1x/week  PT DURATION: 8 weeks  PLANNED INTERVENTIONS: Therapeutic exercises, Therapeutic activity, Neuromuscular re-education, Balance training, Gait training, Patient/Family education, Self Care, Joint mobilization, Stair training, DME instructions, Manual therapy, and Re-evaluation  Orva Gwaltney E Yariel Ferraris, PT, DPT 02/23/2023, 3:29 PM

## 2023-02-24 ENCOUNTER — Ambulatory Visit: Payer: Medicare PPO | Admitting: Neurology

## 2023-02-24 ENCOUNTER — Encounter: Payer: Self-pay | Admitting: Neurology

## 2023-02-24 VITALS — BP 129/79 | HR 65 | Ht 64.0 in | Wt 127.0 lb

## 2023-02-24 DIAGNOSIS — G478 Other sleep disorders: Secondary | ICD-10-CM

## 2023-02-24 DIAGNOSIS — G4752 REM sleep behavior disorder: Secondary | ICD-10-CM | POA: Diagnosis not present

## 2023-02-24 DIAGNOSIS — R269 Unspecified abnormalities of gait and mobility: Secondary | ICD-10-CM

## 2023-02-24 DIAGNOSIS — R413 Other amnesia: Secondary | ICD-10-CM

## 2023-02-24 DIAGNOSIS — F0280 Dementia in other diseases classified elsewhere without behavioral disturbance: Secondary | ICD-10-CM

## 2023-02-24 DIAGNOSIS — G3183 Dementia with Lewy bodies: Secondary | ICD-10-CM

## 2023-02-24 MED ORDER — CLONAZEPAM 1 MG PO TABS: ORAL_TABLET | ORAL | 5 refills | Status: DC

## 2023-02-24 NOTE — Progress Notes (Signed)
[facility]  PATIENT: Kristine [name] DOB: 10/08/1941  REFERRING DOCTOR OR PCP: Lucianne Lei MD SOURCE: Patient, husband, notes from PCP  _________________________________   HISTORICAL  CHIEF COMPLAINT:  Chief Complaint  Patient presents with   Room 10    Pt is here with her Husband. Pt's husband states she has good days and other days. Pt's husband states that he hasn't seen a lot of changes since last visit.     HISTORY OF PRESENT ILLNESS:  Kristine [name] is an 82 year old woman with probable Lewy body dementia  Update 02/24/2023 Her gait is about the same - very good on level surface and able to climb steps carefully   She is sometimes off balance and stumbles some.  She has had one fall getting out the car  She has a REM behavior disorder.    She sill has talking, but no yelling, or kicking in her sleep.    Increasing the dose to 1 mg only helped a bit.   She was waking up a lot and we started quetiapine.   She is sleeping better now.    She tolerates it well - no grogginess in am.   She takes naps most days.     Memory is poor but stable.   She scored 20/30 on hte MMSE today, better than last year.      She has hallucinations hearing and seeing children that are not there.    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  She has no tremors.  Gait is worse and more shuffling than a couple years ago.   The stride is reduced and she has had a couple falls over the past 2 years.  She ha gained some weight since the last visit.   She has HTN, CAD and hyperthyroidism.       02/24/2023    1:18 PM 06/02/2022   11:55 AM  MMSE - Mini Mental State Exam  Orientation to time 2 0  Orientation to Place 5 4  Registration 3 3  Attention/ Calculation 1 0  Recall 0 0  Language- name 2 objects 2 1  Language- repeat 1 1  Language- follow 3 step command 3 3  Language- read & follow direction 1 1  Write a sentence  1 0  Copy design 1 0  Total score 20 13     POLYSOMNOGRAM 01/08/2021 1.  No significant OSA throughout most of the night (AHI = 1.4).  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  Sleep talking during REM Sleep 3.  Rapid sleep onset (0 minutes) with sleep efficiency = 64%.  No N3 sleep recorded    EPWORTH SLEEPINESS SCALE  On a scale of 0 - 3 what is the chance of dozing:  Sitting and Reading:   2 Watching TV:    3 Sitting inactive in a public place: 0 Passenger in car for one hour: 3 Lying down to rest in the afternoon: 3 Sitting and talking to someone: 1 Sitting quietly after lunch:  3 In a car, stopped in traffic:  0  Total (out of 24):   15/24   Moderate excessive daytime sleepiness.      REVIEW OF SYSTEMS: Constitutional: No fevers, chills, sweats, or change in appetite Eyes: No visual changes, double vision, eye pain Ear, nose and throat: No hearing loss, ear pain, nasal congestion, sore throat Cardiovascular: No chest  pain, palpitations Respiratory:  No shortness of breath at rest or with exertion.   No wheezes GastrointestinaI: No nausea, vomiting, diarrhea, abdominal pain, fecal incontinence Genitourinary:  No dysuria, urinary retention or frequency.  No nocturia. Musculoskeletal:  No neck pain, back pain Integumentary: No rash, pruritus, skin lesions Neurological: as above Psychiatric: No depression at this time.  No anxiety Endocrine: No palpitations, diaphoresis, change in appetite, change in weigh or increased thirst Hematologic/Lymphatic:  No anemia, purpura, petechiae. Allergic/Immunologic: No itchy/runny eyes, nasal congestion, recent allergic reactions, rashes  ALLERGIES: Allergies  Allergen Reactions   Ace Inhibitors Swelling    Angioedema of the tongue   Iodinated Contrast Media Anaphylaxis   Other Anaphylaxis    Allergic to melons, nuts   Shellfish-Derived Products Anaphylaxis   Hydrocodone Other (See Comments)    unspecified     HOME MEDICATIONS:  Current Outpatient Medications:    aspirin EC 81 MG tablet, Take 81 mg by mouth daily., Disp: , Rfl:    atorvastatin (LIPITOR) 80 MG tablet, Take 1 tablet (80 mg total) by mouth daily. Please keep upcoming appt.with provider to receive future refills. Thank You., Disp: 90 tablet, Rfl: 3   BREZTRI AEROSPHERE 160-9-4.8 MCG/ACT AERO, Inhale 2 puffs into the lungs 2 (two) times daily., Disp: , Rfl:    chlorhexidine (PERIDEX) 0.12 % solution, as directed., Disp: , Rfl:    cyclobenzaprine (FLEXERIL) 5 MG tablet, Take 1-2 tablets (5-10 mg total) by mouth at bedtime as needed for muscle spasms., Disp: 60 tablet, Rfl: 2   diclofenac Sodium (VOLTAREN) 1 % GEL, Apply topically., Disp: , Rfl:    donepezil (ARICEPT) 10 MG tablet, TAKE 1 TABLET(5 MG) BY MOUTH AT BEDTIME, Disp: 90 tablet, Rfl: 1   DULoxetine (CYMBALTA) 60 MG capsule, Take 1 capsule (60 mg total) by mouth daily., Disp: 90 capsule, Rfl: 3   ezetimibe (ZETIA) 10 MG tablet, TAKE 1 TABLET(10 MG) BY MOUTH DAILY, Disp: 90 tablet, Rfl: 2   hydrALAZINE (APRESOLINE) 25 MG tablet, Take 1 tablet (25 mg total) by mouth daily., Disp: 90 tablet, Rfl: 2   hydrochlorothiazide (HYDRODIURIL) 25 MG tablet, TAKE 1 TABLET(25 MG) BY MOUTH DAILY, Disp: 90 tablet, Rfl: 1   hydrOXYzine (ATARAX) 10 MG tablet, Take 1 or 2 tablets by mouth twice a day as needed for agitation, Disp: 90 tablet, Rfl: 3   levothyroxine (SYNTHROID) 50 MCG tablet, Take 50 mcg by mouth every morning., Disp: , Rfl:    methimazole (TAPAZOLE) 5 MG tablet, Take 5 mg by mouth daily., Disp: , Rfl:    metoprolol succinate (TOPROL-XL) 50 MG 24 hr tablet, TAKE 1 TABLET(50 MG) BY MOUTH DAILY, Disp: 90 tablet, Rfl: 3   mirtazapine (REMERON) 15 MG tablet, 1/2 to 1 pill po qHS, Disp: 30 tablet, Rfl: 5   nitroGLYCERIN (NITROSTAT) 0.3 MG SL tablet, Place 1 tablet (0.3 mg total) under the tongue every 5 (five) minutes as needed for chest pain., Disp: 25 tablet, Rfl: 3   OLANZapine  (ZYPREXA) 2.5 MG tablet, Take 1 tablet (2.5 mg total) by mouth at bedtime., Disp: 30 tablet, Rfl: 5   potassium chloride (KLOR-CON) 10 MEQ tablet, Take 10 mEq by mouth 2 (two) times daily., Disp: , Rfl:    QUEtiapine (SEROQUEL) 25 MG tablet, Take 1 tablet (25 mg total) by mouth at bedtime., Disp: 30 tablet, Rfl: 5   risperiDONE (RISPERDAL) 0.25 MG tablet, Take 1 tablet (0.25 mg total) by mouth 2 (two) times daily as needed. Secondary to Lewy Body Dementia,  Disp: 60 tablet, Rfl: 5   Vitamin D, Ergocalciferol, (DRISDOL) 1.25 MG (50000 UNIT) CAPS capsule, Take 50,000 Units by mouth every 7 (seven) days., Disp: , Rfl:    clonazePAM (KLONOPIN) 1 MG tablet, 1 pill po qHS, Disp: 30 tablet, Rfl: 5  PAST MEDICAL HISTORY: Past Medical History:  Diagnosis Date   COPD (chronic obstructive pulmonary disease) ([place])    Hard of hearing    History of anemia    History of blood transfusion    History of pneumonia    HTN (hypertension)    Hyperlipidemia    Hyperthyroidism    treated wtih radioactive iodine   OA (osteoarthritis of spine)    PVD (peripheral vascular disease) ([place])    Lower extremity Dopplers  May 2revealed an ABI of 0.83 in the right posterior    Shortness of breath dyspnea    STEMI (ST elevation myocardial infarction) [facility]) May 2011   Promus DES in the proximal, mid and distal RCA in May 2011   Stroke [facility])    Urinary frequency    Urinary urgency     PAST SURGICAL HISTORY: Past Surgical History:  Procedure Laterality Date   ABDOMINAL HYSTERECTOMY     Had ovarian resection and required lysis of adhesions   CARDIAC CATHETERIZATION  04/07/2010   EF 60-70%   CORONARY STENTS     x3   ESOPHAGOGASTRODUODENOSCOPY N/A 03/10/2015   Procedure: ESOPHAGOGASTRODUODENOSCOPY (EGD);  Surgeon: Acquanetta Sit, MD;  Location: [facility];  Service: Endoscopy;  Laterality: N/A;   OVARY SURGERY     TOTAL HIP ARTHROPLASTY  2004   left   TOTAL HIP ARTHROPLASTY Right 01/23/2016   Procedure: RIGHT TOTAL HIP  ARTHROPLASTY ANTERIOR APPROACH;  Surgeon: Ninetta Lights, MD;  Location: [facility];  Service: Orthopedics;  Laterality: Right;    FAMILY HISTORY: Family History  Problem Relation Age of Onset   Peripheral vascular disease Mother    Congestive Heart Failure Mother    Heart attack Father     SOCIAL HISTORY:  Social History   Socioeconomic History   Marital status: Married    Spouse name: Designer, industrial/product   Number of children: 3   Years of education: Masters   Highest education level: Not on file  Occupational History   Occupation: Retired  Tobacco Use   Smoking status: Former    Packs/day: 1.00    Years: 45.00    Additional pack years: 0.00    Total pack years: 45.00    Types: Cigarettes    Quit date: 04/07/2010    Years since quitting: 12.8   Smokeless tobacco: Never  Vaping Use   Vaping Use: Never used  Substance and Sexual Activity   Alcohol use: Yes    Comment: rarely    Drug use: No   Sexual activity: Not on file  Other Topics Concern   Not on file  Social History Narrative   Lives with husband, daughter and her son   Right handed   Tea sometimes, decaf coffee   Social Determinants of Health   Financial Resource Strain: Not on file  Food Insecurity: Not on file  Transportation Needs: Not on file  Physical Activity: Not on file  Stress: Not on file  Social Connections: Not on file  Intimate Partner Violence: Not on file     PHYSICAL EXAM  Vitals:   02/24/23 1313  BP: 129/79  Pulse: 65  Weight: 127 lb (57.6 kg)  Height: 5\' 4"  (1.626 m)  Body mass index is 21.8 kg/m.   General: The patient is well-developed and well-nourished and in no acute distress  HEENT:  Head is [place]/AT.  Sclera are anicteric.  Skin: Extremities are without rash or  edema.  Neurologic Exam  Mental status: She has reduced focus/attention and memory.  Speech is normal.  Cranial nerves: Extraocular movements are full.  Facial strength and sensation are normal.  No obvious  hearing deficits are noted.  Motor:  Muscle bulk is normal.   Tone is slightly increased in the arms with mild cogwheeling.. Strength is  5 / 5 in all 4 extremities.   Sensory: Sensory testing is intact to pinprick, soft touch and vibration sensation in all 4 extremities.  Coordination: Cerebellar testing reveals good finger-nose-finger and heel-to-shin bilaterally.  Gait and station: Station is normal.  The stride is reduced.  She turns 180 degrees in 6 steps.  She has retropulsion when the posture is disturbed.  Romberg is negative.  Reflexes: Deep tendon reflexes are symmetric and normal bilaterally.      DIAGNOSTIC DATA (LABS, IMAGING, TESTING) - I reviewed patient records, labs, notes, testing and imaging myself where available.  Lab Results  Component Value Date   WBC 5.8 10/11/2022   HGB 11.2 (L) 10/11/2022   HCT 35.1 (L) 10/11/2022   MCV 91.2 10/11/2022   PLT 300 10/11/2022      Component Value Date/Time   NA 144 12/12/2022 1445   K 4.2 12/12/2022 1445   CL 103 12/12/2022 1445   CO2 27 12/12/2022 1445   GLUCOSE 91 12/12/2022 1445   GLUCOSE 88 10/11/2022 2303   BUN 14 12/12/2022 1445   CREATININE 0.88 12/12/2022 1445   CALCIUM 9.8 12/12/2022 1445   PROT 7.2 12/12/2022 1445   ALBUMIN 4.2 12/12/2022 1445   AST 32 12/12/2022 1445   ALT 18 12/12/2022 1445   ALKPHOS 99 12/12/2022 1445   BILITOT 0.5 12/12/2022 1445   GFRNONAA >60 10/11/2022 2303   GFRAA >60 06/23/2020 2120   Lab Results  Component Value Date   CHOL 134 12/12/2022   HDL 45 12/12/2022   LDLCALC 71 12/12/2022   LDLDIRECT 138.9 07/23/2011   TRIG 97 12/12/2022   CHOLHDL 3.0 12/12/2022   Lab Results  Component Value Date   HGBA1C  05/30/2010    5.5 (NOTE)                                                                       According to the ADA Clinical Practice Recommendations for 2011, when HbA1c is used as a screening test:   >=6.5%   Diagnostic of Diabetes Mellitus           (if abnormal  result  is confirmed)  5.7-6.4%   Increased risk of developing Diabetes Mellitus  References:Diagnosis and Classification of Diabetes Mellitus,Diabetes [id number] 1):S62-S69 and Standards of Medical Care in         Diabetes - 2011,Diabetes Care,2011,34  (Suppl 1):S11-S61.    Lab Results  Component Value Date   TSH 0.283 (L) 05/31/2015       ASSESSMENT AND PLAN  Lewy body dementia without behavioral disturbance, psychotic disturbance, mood disturbance, or anxiety, unspecified dementia severity (HCC)  REM behavioral disorder  Gait  disturbance  Memory loss  Somniloquy  1.   I believe she has Lewy Body dementia.   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  For the REM behavior disorder, continue clonazepam 1 mg.  She is sleeping better with seroquel - no longer awakening as much 3.   Stop cymbalta 4.    She will return to see [place] in 6 months or sooner if there are new or worsening neurologic symptoms.  40-minute office visit with the majority of the time spent face-to-face for history and physical, discussion/counseling and decision-making.  Additional time with record review and documentation.   Sullivan Blasing A. Felecia Shelling, MD, [facility] [id number], [id number] PM Certified in Neurology, Clinical Neurophysiology, Sleep Medicine and Neuroimaging  [facility] [address], [place], [place] [address] [phone]

## 2023-02-26 ENCOUNTER — Other Ambulatory Visit: Payer: Self-pay | Admitting: Neurology

## 2023-02-26 MED ORDER — CLONAZEPAM 1 MG PO TABS: ORAL_TABLET | ORAL | 5 refills | Status: DC

## 2023-02-26 NOTE — Telephone Encounter (Signed)
Pt's spouse reports that the symptoms of: kicking, screaming, clapping, signing, fussing at children(when there is only pt and spouse in home)these symptoms have returned and spouse is asking for a call to discuss what can be done at this time.

## 2023-02-26 NOTE — Telephone Encounter (Signed)
Pt husband called back stating pt has been doing well thus far, last visit was on 02/24/23. Pt husband since this past visit said pt has started back waking up in the middle of the night. He also mentioned the below (see below message). Patient is taking clonazepam 1 mg tablet at bedtime, donepezil 10 mg tablet at bedtime and Quetiapine 25 mg tablet at bedtime.  Patient husband asked me to relay this information to you and see if you have any other recommendations?  Please advise

## 2023-02-26 NOTE — Telephone Encounter (Signed)
Patient husband informed with all the below.

## 2023-03-05 ENCOUNTER — Emergency Department (HOSPITAL_COMMUNITY)
Admission: EM | Admit: 2023-03-05 | Discharge: 2023-03-06 | Disposition: A | Discharge status: Home / Self Care | Payer: Medicare PPO | Attending: Emergency Medicine | Admitting: Emergency Medicine

## 2023-03-05 DIAGNOSIS — R079 Chest pain, unspecified: Secondary | ICD-10-CM | POA: Diagnosis not present

## 2023-03-05 DIAGNOSIS — R410 Disorientation, unspecified: Secondary | ICD-10-CM | POA: Insufficient documentation

## 2023-03-05 DIAGNOSIS — R0789 Other chest pain: Secondary | ICD-10-CM | POA: Diagnosis not present

## 2023-03-05 DIAGNOSIS — Z7982 Long term (current) use of aspirin: Secondary | ICD-10-CM | POA: Insufficient documentation

## 2023-03-05 NOTE — ED Triage Notes (Addendum)
Patient coming to ED for evaluation of pain to "chest."  When asked location, patient points to L and R upper chest/pectoral area.  No reports of shortness of breath.  Currently patient is chest pain free.  Husband states "her dementia is getting worse and she has also had a lot of gas."  No current complaints.  Was evaluated by EMS prior to coming to ED.

## 2023-03-05 NOTE — ED Triage Notes (Incomplete)
Patient coming to ED for evaluation of pain to "chest."  When asked location, patient points to L and R upper chest/pectoral area

## 2023-03-06 ENCOUNTER — Emergency Department (HOSPITAL_COMMUNITY): Payer: Medicare PPO

## 2023-03-06 ENCOUNTER — Other Ambulatory Visit: Payer: Self-pay

## 2023-03-06 DIAGNOSIS — R079 Chest pain, unspecified: Secondary | ICD-10-CM | POA: Diagnosis not present

## 2023-03-06 LAB — CBC WITH DIFFERENTIAL/PLATELET
Abs Immature Granulocytes: 0.02 10*3/uL (ref 0.00–0.07)
Basophils Absolute: 0.1 10*3/uL (ref 0.0–0.1)
Basophils Relative: 1 %
Eosinophils Absolute: 0.3 10*3/uL (ref 0.0–0.5)
Eosinophils Relative: 6 %
HCT: 41 % (ref 36.0–46.0)
Hemoglobin: 12.9 g/dL (ref 12.0–15.0)
Immature Granulocytes: 0 %
Lymphocytes Relative: 32 %
Lymphs Abs: 1.8 10*3/uL (ref 0.7–4.0)
MCH: 28.2 pg (ref 26.0–34.0)
MCHC: 31.5 g/dL (ref 30.0–36.0)
MCV: 89.7 fL (ref 80.0–100.0)
Monocytes Absolute: 0.6 10*3/uL (ref 0.1–1.0)
Monocytes Relative: 11 %
Neutro Abs: 2.8 10*3/uL (ref 1.7–7.7)
Neutrophils Relative %: 50 %
Platelets: 255 10*3/uL (ref 150–400)
RBC: 4.57 MIL/uL (ref 3.87–5.11)
RDW: 16.6 % — ABNORMAL HIGH (ref 11.5–15.5)
WBC: 5.6 10*3/uL (ref 4.0–10.5)
nRBC: 0 % (ref 0.0–0.2)

## 2023-03-06 LAB — COMPREHENSIVE METABOLIC PANEL
ALT: 17 U/L (ref 0–44)
AST: 24 U/L (ref 15–41)
Albumin: 3.8 g/dL (ref 3.5–5.0)
Alkaline Phosphatase: 77 U/L (ref 38–126)
Anion gap: 8 (ref 5–15)
BUN: 24 mg/dL — ABNORMAL HIGH (ref 8–23)
CO2: 27 mmol/L (ref 22–32)
Calcium: 9.2 mg/dL (ref 8.9–10.3)
Chloride: 99 mmol/L (ref 98–111)
Creatinine, Ser: 0.81 mg/dL (ref 0.44–1.00)
GFR, Estimated: >60 mL/min (ref >60)
Glucose, Bld: 97 mg/dL (ref 70–99)
Potassium: 3.7 mmol/L (ref 3.5–5.1)
Sodium: 134 mmol/L — ABNORMAL LOW (ref 135–145)
Total Bilirubin: 0.7 mg/dL (ref 0.3–1.2)
Total Protein: 7.4 g/dL (ref 6.5–8.1)

## 2023-03-06 LAB — TROPONIN I (HIGH SENSITIVITY)
Troponin I (High Sensitivity): 4 ng/L (ref <18)
Troponin I (High Sensitivity): 4 ng/L (ref <18)

## 2023-03-06 LAB — LIPASE, BLOOD: Lipase: 50 U/L (ref 11–51)

## 2023-03-06 NOTE — ED Provider Notes (Signed)
[place] EMERGENCY DEPARTMENT AT [facility] Provider Note   CSN: [id number] Arrival date & time: 03/05/23  2342     History  Chief Complaint  Patient presents with   Chest Pain    Kristine [name] is a 82 y.o. female.  Patient presents to the emergency department for evaluation of chest pain.  She is accompanied by her husband who provides information because she is demented.  He reports that she started to complain of chest pain around 9:30 PM.  Patient not experiencing any associated shortness of breath.  Husband reports that she had a heart attack many years ago.  When patient was brought back to the exam room she now reports that she does not feel the pain.  Husband reports that he did call EMS to the house, they ran an EKG and told her him that everything looked okay but that she should come to the hospital to get checked out.  Husband reports that she has had a lot of flatulence lately.  She denies abdominal pain.       Home Medications Prior to Admission medications   Medication Sig Start Date End Date Taking? Authorizing Provider  aspirin EC 81 MG tablet Take 81 mg by mouth daily.    [provider]  atorvastatin (LIPITOR) 80 MG tablet Take 1 tablet (80 mg total) by mouth daily. Please keep upcoming appt.with provider to receive future refills. Thank You. 12/13/22   Swinyer, Lanice Schwab, NP  BREZTRI AEROSPHERE 160-9-4.8 MCG/ACT AERO Inhale 2 puffs into the lungs 2 (two) times daily. 10/17/20   [provider]  chlorhexidine (PERIDEX) 0.12 % solution as directed. 06/05/22   [provider]  clonazePAM Bobbye [place]) 1 MG tablet 1.5 pills po qHS 02/26/23   Sater, Nanine Means, MD  cyclobenzaprine (FLEXERIL) 5 MG tablet Take 1-2 tablets (5-10 mg total) by mouth at bedtime as needed for muscle spasms. 02/05/22   Lovorn, Jinny Blossom, MD  diclofenac Sodium (VOLTAREN) 1 % GEL Apply topically. 09/19/21   [provider]  donepezil (ARICEPT) 10 MG tablet  TAKE 1 TABLET(5 MG) BY MOUTH AT BEDTIME 02/11/23   Sater, Nanine Means, MD  ezetimibe (ZETIA) 10 MG tablet TAKE 1 TABLET(10 MG) BY MOUTH DAILY 05/13/22   Burnell Blanks, MD  hydrALAZINE (APRESOLINE) 25 MG tablet Take 1 tablet (25 mg total) by mouth daily. 01/12/23   Swinyer, Lanice Schwab, NP  hydrochlorothiazide (HYDRODIURIL) 25 MG tablet TAKE 1 TABLET(25 MG) BY MOUTH DAILY 02/02/23   Burnell Blanks, MD  hydrOXYzine (ATARAX) 10 MG tablet Take 1 or 2 tablets by mouth twice a day as needed for agitation 01/22/23   Sater, Nanine Means, MD  levothyroxine (SYNTHROID) 50 MCG tablet Take 50 mcg by mouth every morning. 05/20/22   [provider]  methimazole (TAPAZOLE) 5 MG tablet Take 5 mg by mouth daily.    [provider]  metoprolol succinate (TOPROL-XL) 50 MG 24 hr tablet TAKE 1 TABLET(50 MG) BY MOUTH DAILY 02/02/23   Burnell Blanks, MD  mirtazapine (REMERON) 15 MG tablet 1/2 to 1 pill po qHS 08/04/22   Sater, Nanine Means, MD  nitroGLYCERIN (NITROSTAT) 0.3 MG SL tablet Place 1 tablet (0.3 mg total) under the tongue every 5 (five) minutes as needed for chest pain. 01/02/22   Burnell Blanks, MD  OLANZapine (ZYPREXA) 2.5 MG tablet Take 1 tablet (2.5 mg total) by mouth at bedtime. 01/28/23   Sater, Nanine Means, MD  potassium chloride (KLOR-CON) 10  MEQ tablet Take 10 mEq by mouth 2 (two) times daily. 04/02/22   [provider]  QUEtiapine (SEROQUEL) 25 MG tablet Take 1 tablet (25 mg total) by mouth at bedtime. 02/19/23   Sater, Nanine Means, MD  risperiDONE (RISPERDAL) 0.25 MG tablet Take 1 tablet (0.25 mg total) by mouth 2 (two) times daily as needed. Secondary to Lewy Body Dementia 12/17/22   Lovorn, Jinny Blossom, MD  Vitamin D, Ergocalciferol, (DRISDOL) 1.25 MG (50000 UNIT) CAPS capsule Take 50,000 Units by mouth every 7 (seven) days.    [provider]      Allergies    Ace inhibitors, Iodinated contrast media, Other, Shellfish-derived products, and Hydrocodone     Review of Systems   Review of Systems  Physical Exam Updated Vital Signs BP (!) 149/87   Pulse 61   Temp 97.8 F (36.6 C) (Oral)   Resp 12   SpO2 100%  Physical Exam Vitals and nursing note reviewed.  Constitutional:      General: She is not in acute distress.    Appearance: She is well-developed.  HENT:     Head: Normocephalic and atraumatic.     Mouth/Throat:     Mouth: Mucous membranes are moist.  Eyes:     General: Vision grossly intact. Gaze aligned appropriately.     Extraocular Movements: Extraocular movements intact.     Conjunctiva/sclera: Conjunctivae normal.  Cardiovascular:     Rate and Rhythm: Normal rate and regular rhythm.     Pulses: Normal pulses.     Heart sounds: Normal heart sounds, S1 normal and S2 normal. No murmur heard.    No friction rub. No gallop.  Pulmonary:     Effort: Pulmonary effort is normal. No respiratory distress.     Breath sounds: Normal breath sounds.  Abdominal:     General: Bowel sounds are normal.     Palpations: Abdomen is soft.     Tenderness: There is no abdominal tenderness. There is no guarding or rebound.     Hernia: No hernia is present.  Musculoskeletal:        General: No swelling.     Cervical back: Full passive range of motion without pain, normal range of motion and neck supple. No spinous process tenderness or muscular tenderness. Normal range of motion.     Right lower leg: No edema.     Left lower leg: No edema.  Skin:    General: Skin is warm and dry.     Capillary Refill: Capillary refill takes less than 2 seconds.     Findings: No ecchymosis, erythema, rash or wound.  Neurological:     General: No focal deficit present.     Mental Status: She is alert. Mental status is at baseline. She is confused.     Cranial Nerves: Cranial nerves 2-12 are intact.     Sensory: Sensation is intact.     Motor: Motor function is intact.     Coordination: Coordination is intact.  Psychiatric:        Attention and  Perception: Attention normal.        Mood and Affect: Mood normal.        Speech: Speech normal.        Behavior: Behavior normal.     ED Results / Procedures / Treatments   Labs (all labs ordered are listed, but only abnormal results are displayed) Labs Reviewed  CBC WITH DIFFERENTIAL/PLATELET - Abnormal; Notable for the following components:  Result Value   RDW 16.6 (*)    All other components within normal limits  COMPREHENSIVE METABOLIC PANEL - Abnormal; Notable for the following components:   Sodium 134 (*)    BUN 24 (*)    All other components within normal limits  LIPASE, BLOOD  TROPONIN I (HIGH SENSITIVITY)  TROPONIN I (HIGH SENSITIVITY)    EKG EKG Interpretation  Date/Time:  Friday March 06 2023 00:12:29 EDT Ventricular Rate:  65 PR Interval:  187 QRS Duration: 105 QT Interval:  456 QTC Calculation: 475 R Axis:   41 Text Interpretation: Sinus rhythm Left atrial enlargement Borderline T abnormalities, diffuse leads No significant change since last tracing Confirmed by Orpah Greek [phone]) on 03/06/2023 12:22:46 AM  Radiology DG ABD ACUTE 2+V W 1V CHEST  Result Date: 03/06/2023 CLINICAL DATA:  Chest pain EXAM: DG ABDOMEN ACUTE WITH 1 VIEW CHEST COMPARISON:  04/02/2022 FINDINGS: There is no evidence of dilated bowel loops or free intraperitoneal air. No radiopaque calculi or other significant radiographic abnormality is seen. Heart size and mediastinal contours are within normal limits. Both lungs are clear. IMPRESSION: Negative abdominal radiographs.  No acute cardiopulmonary disease. Electronically Signed   By: Ulyses Jarred M.D.   On: 03/06/2023 01:12    Procedures Procedures    Medications Ordered in ED Medications - No data to display  ED Course/ Medical Decision Making/ A&P                             Medical Decision Making Amount and/or Complexity of Data Reviewed External Data Reviewed: ECG and notes. Labs: ordered. Decision-making  details documented in ED Course. Radiology: ordered and independent interpretation performed. Decision-making details documented in ED Course. ECG/medicine tests: ordered and independent interpretation performed. Decision-making details documented in ED Course.   Differential Diagnosis considered includes, but not limited to: STEMI; NSTEMI; myocarditis; pericarditis; pulmonary embolism; aortic dissection; pneumothorax; pneumonia; gastritis.  Presents with chest pain.  Patient started complaining of pain a couple of hours before coming to the ED.  Husband reports that she did have a heart attack some years ago.  Records indicate that she had an inferior STEMI in 2011 treated with multiple stents.  She has done well since then.  She visits cardiology yearly.  She has had a stress test that did not show ischemia in 2014, no dynamic testing since then.  At time of arrival to the emergency department exam room, she reports that she is no longer having pain.  She has not had recurrence of the pain while in the room.  EKG unchanged from prior.  Troponin negative x 2.  Patient does have known CAD, status post drug-eluting stents to RCA.  Workup today has been negative, discharged with prompt outpatient follow-up will be appropriate.  Return for recurrent chest pains.        Final Clinical Impression(s) / ED Diagnoses Final diagnoses:  Chest pain, unspecified type    Rx / DC Orders ED Discharge Orders     None         Shep Porter, Gwenyth Allegra, MD 03/06/23 [phone]

## 2023-03-06 NOTE — ED Notes (Addendum)
Patient was hard stick; this RN, Anderson Malta RN, Gwendel Hanson NT, and [place] NT all tried to obtain labs. Although delayed, labs finally obtained. Notified Dr. Betsey Holiday.

## 2023-03-10 ENCOUNTER — Ambulatory Visit: Payer: Medicare PPO | Admitting: Podiatry

## 2023-03-10 ENCOUNTER — Encounter: Payer: Self-pay | Admitting: Podiatry

## 2023-03-10 DIAGNOSIS — Q828 Other specified congenital malformations of skin: Secondary | ICD-10-CM

## 2023-03-10 DIAGNOSIS — I739 Peripheral vascular disease, unspecified: Secondary | ICD-10-CM | POA: Diagnosis not present

## 2023-03-10 DIAGNOSIS — M79674 Pain in right toe(s): Secondary | ICD-10-CM | POA: Diagnosis not present

## 2023-03-10 DIAGNOSIS — B351 Tinea unguium: Secondary | ICD-10-CM

## 2023-03-10 DIAGNOSIS — M79675 Pain in left toe(s): Secondary | ICD-10-CM | POA: Diagnosis not present

## 2023-03-10 NOTE — Progress Notes (Signed)
This patient returns to my office for at risk foot care.  This patient requires this care by a professional since this patient will be at risk due to having PAD, PVD.  This patient has painful callus on her left forefoot.  This callus is painful walking and wearing her shoes.  This patient presents for at risk foot care today.  General Appearance  Alert, conversant and in no acute stress.  Vascular  Dorsalis pedis and posterior tibial  pulses are weakly  palpable  bilaterally.  Capillary return is within normal limits  bilaterally. Temperature is within normal limits  bilaterally.  Neurologic  Senn-Weinstein monofilament wire test within normal limits  bilaterally. Muscle power within normal limits bilaterally.  Nails Thick disfigured discolored nails with subungual debris  from hallux to fifth toes bilaterally. No evidence of bacterial infection or drainage bilaterally.  Orthopedic  No limitations of motion  feet .  No crepitus or effusions noted.  No bony pathology or digital deformities noted.  Skin  normotropic skin noted bilaterally.  No signs of infections or ulcers noted.   Porokeratosis sub 3 left foot.    Porokeratosis sub 3 left foot.   Consent was obtained for treatment procedures.   Debridement of porokeratosis with # 15 blade.  Nails were done as a courtesy.   Return office visit    9 weeks                  Told patient to return for periodic foot care and evaluation due to potential at risk complications.  Padding dispensed for dispersion padding.   Vonzell Lindblad DPM  

## 2023-03-10 NOTE — Progress Notes (Signed)
Cardiology Office Note:    Date:  03/11/2023   ID:  Kristine [name], DOB 1941/10/08, MRN [id number]  PCP:  Renaye Rakers, MD  [facility] Providers Cardiologist:  Verne Carrow, MD     Referring MD: Gilda Crease,*   Chief Complaint:  Hospitalization Follow-up     History of Present Illness:   Kristine [name] is a 82 y.o. female with a hx of CAD, hypertension, hyperlipidemia, COPD, TIA, PAD, and dementia.   She had an inferior STEMI in 2011 treated with 3 drug-eluting stents in the proximal, mid, and distal RCA.  She is known to have anomalous takeoff of all coronaries.  Nuclear stress test October 2014 with no ischemia.  Has PAD with abnormal LE arterial Doppler since May 2011.  Seen in 2011 by Dr. Hart Rochester but has had serial LE arterial testing in our office since.  Seen in [facility] clinic by Dr. Allyson Sabal December 2018 and he felt toe pain was not vascular in nature.  He planned to follow-up as needed in the [facility] clinic.  ABI December 2022 unchanged.  Seen in the ED June 2022 with chest pain, troponin negative.  Echo June 2022 with LVEF 60 to 65%, no significant valve disease.  Her chest pain was not felt to be cardiac related.  Protonix was added but she did not take it.    Patient saw Ms. Swinyer NP 12/2022 and was stable.   In ED with chest pain 03/05/23 and trop negative, EKG unchanged.  She comes in today with her husband. No further chest pain, shortness of breath, palpitations.  Overall she has been stable. Having sleep issues managed by neuro.     Past Medical History:  Diagnosis Date   COPD (chronic obstructive pulmonary disease)    Hard of hearing    History of anemia    History of blood transfusion    History of pneumonia    HTN (hypertension)    Hyperlipidemia    Hyperthyroidism    treated wtih radioactive iodine   OA (osteoarthritis of spine)    PVD (peripheral vascular disease)    Lower extremity Dopplers  May 2revealed an ABI of 0.83 in the right  posterior    Shortness of breath dyspnea    STEMI (ST elevation myocardial infarction) May 2011   Promus DES in the proximal, mid and distal RCA in May 2011   Stroke    Urinary frequency    Urinary urgency    Current Medications: Current Meds  Medication Sig   aspirin EC 81 MG tablet Take 81 mg by mouth daily.   atorvastatin (LIPITOR) 80 MG tablet Take 1 tablet (80 mg total) by mouth daily. Please keep upcoming appt.with provider to receive future refills. Thank You.   BREZTRI AEROSPHERE 160-9-4.8 MCG/ACT AERO Inhale 2 puffs into the lungs 2 (two) times daily.   chlorhexidine (PERIDEX) 0.12 % solution as directed.   clonazePAM (KLONOPIN) 1 MG tablet 1.5 pills po qHS   cyclobenzaprine (FLEXERIL) 5 MG tablet Take 1-2 tablets (5-10 mg total) by mouth at bedtime as needed for muscle spasms.   diclofenac Sodium (VOLTAREN) 1 % GEL Apply topically.   donepezil (ARICEPT) 10 MG tablet TAKE 1 TABLET(5 MG) BY MOUTH AT BEDTIME   ezetimibe (ZETIA) 10 MG tablet TAKE 1 TABLET(10 MG) BY MOUTH DAILY   hydrochlorothiazide (HYDRODIURIL) 25 MG tablet TAKE 1 TABLET(25 MG) BY MOUTH DAILY   hydrOXYzine (ATARAX) 10 MG tablet Take 1 or 2 tablets by mouth  twice a day as needed for agitation   levothyroxine (SYNTHROID) 50 MCG tablet Take 50 mcg by mouth every morning.   methimazole (TAPAZOLE) 5 MG tablet Take 5 mg by mouth daily.   metoprolol succinate (TOPROL-XL) 50 MG 24 hr tablet TAKE 1 TABLET(50 MG) BY MOUTH DAILY   nitroGLYCERIN (NITROSTAT) 0.3 MG SL tablet Place 1 tablet (0.3 mg total) under the tongue every 5 (five) minutes as needed for chest pain.   OLANZapine (ZYPREXA) 2.5 MG tablet Take 1 tablet (2.5 mg total) by mouth at bedtime.   potassium chloride (KLOR-CON) 10 MEQ tablet Take 10 mEq by mouth 2 (two) times daily.   QUEtiapine (SEROQUEL) 25 MG tablet Take 1 tablet (25 mg total) by mouth at bedtime.   risperiDONE (RISPERDAL) 0.25 MG tablet Take 1 tablet (0.25 mg total) by mouth 2 (two) times daily as  needed. Secondary to Lewy Body Dementia   Vitamin D, Ergocalciferol, (DRISDOL) 1.25 MG (50000 UNIT) CAPS capsule Take 50,000 Units by mouth every 7 (seven) days.   [DISCONTINUED] hydrALAZINE (APRESOLINE) 25 MG tablet Take 1 tablet (25 mg total) by mouth daily.   [DISCONTINUED] mirtazapine (REMERON) 15 MG tablet 1/2 to 1 pill po qHS    Allergies:   Ace inhibitors, Iodinated contrast media, Other, Shellfish-derived products, and Hydrocodone   Social History   Tobacco Use   Smoking status: Former    Packs/day: 1.00    Years: 45.00    Additional pack years: 0.00    Total pack years: 45.00    Types: Cigarettes    Quit date: 04/07/2010    Years since quitting: 12.9   Smokeless tobacco: Never  Vaping Use   Vaping Use: Never used  Substance Use Topics   Alcohol use: Yes    Comment: rarely    Drug use: No    Family Hx: The patient's family history includes Congestive Heart Failure in her mother; Heart attack in her father; Peripheral vascular disease in her mother.  ROS     Physical Exam:    VS:  BP 102/76   Pulse 68   Ht 5\' 4"  (1.626 m)   Wt 124 lb 9.6 oz (56.5 kg)   SpO2 100%   BMI 21.39 kg/m     Wt Readings from Last 3 Encounters:  03/11/23 124 lb 9.6 oz (56.5 kg)  02/24/23 127 lb (57.6 kg)  12/17/22 113 lb (51.3 kg)    Physical Exam  GEN: Well nourished, well developed, in no acute distress  Neck: no JVD, carotid bruits, or masses Cardiac:RRR; no murmurs, rubs, or gallops  Respiratory:  clear to auscultation bilaterally, normal work of breathing GI: soft, nontender, nondistended, + BS Ext: without cyanosis, clubbing, or edema, Decreased distal pulses bilaterally Neuro:  Alert and Oriented x 3,  Psych: euthymic mood, full affect        EKGs/Labs/Other Test Reviewed:    EKG:  EKG is not ordered today.     Recent Labs: 10/11/2022: B Natriuretic Peptide 133.1 03/06/2023: ALT 17; BUN 24; Creatinine, Ser 0.81; Hemoglobin 12.9; Platelets 255; Potassium 3.7; Sodium  134   Recent Lipid Panel Recent Labs    12/12/22 1445  CHOL 134  TRIG 97  HDL 45  LDLCALC 71     Prior CV Studies:     VAS ABI 12/04/21 Right ABIs appear increased compared to prior study on 12/04/20. Left ABIs  appear essentially unchanged compared to prior study on 12/04/20.    Summary:  Right: Resting right ankle-brachial index  indicates mild right lower  extremity arterial disease. The right toe-brachial index is abnormal.   Left: Resting left ankle-brachial index indicates mild left lower  extremity arterial disease. The left toe-brachial index is abnormal.     *See table(s) above for measurements and observations.      Echo 05/15/21 1. Left ventricular ejection fraction, by estimation, is 60 to 65%. The  left ventricle has normal function. The left ventricle has no regional  wall motion abnormalities. Left ventricular diastolic parameters are  consistent with Grade I diastolic  dysfunction (impaired relaxation).   2. Right ventricular systolic function is normal. The right ventricular  size is normal. There is normal pulmonary artery systolic pressure. The  estimated right ventricular systolic pressure is 23.6 mmHg.   3. The mitral valve is normal in structure. No evidence of mitral valve  regurgitation. No evidence of mitral stenosis.   4. The aortic valve is calcified. Aortic valve regurgitation is not  visualized. Mild to moderate aortic valve sclerosis/calcification is  present, without any evidence of aortic stenosis.   5. The inferior vena cava is normal in size with greater than 50%  respiratory variability, suggesting right atrial pressure of 3 mmHg.      Myocardial Perfusion Imaging 10/06/2013 No ischemia    Risk Assessment/Calculations/Metrics:              ASSESSMENT & PLAN:   No problem-specific Assessment & Plan notes found for this encounter.   CAD without angina: History of STEMI 2011 treated with DES x 3 to prox, mid and distal RCA, no  ischemia on NST 2014.  In ED with chest pain in March 02/2023 trop negative EKG unchanged. No further chest pain. Given dementia,  No indication for further ischemic evaluation at this time. She would likely not be a candidate for intervention 2/2 frailty, comorbid conditions.   PVD: Stable infrainguinal arterial occlusive disease bilaterally with mild claudication per report 12/11/22. Management per [facility].    Hyperlipidemia LDL goal < 70: LDL 71 on 12/12/22.  Goal LDL< 70 in the setting of CAD and PAD.  Continue ezetimibe, Lipitor.    Hypertension: BP is low today. Will decrease hydralazine 25 mg 1/2 daily.               Dispo:  No follow-ups on file.   Medication Adjustments/Labs and Tests Ordered: Current medicines are reviewed at length with the patient today.  Concerns regarding medicines are outlined above.  Tests Ordered: No orders of the defined types were placed in this encounter.  Medication Changes: Meds ordered this encounter  Medications   hydrALAZINE (APRESOLINE) 25 MG tablet    Sig: Take 1/2 a tablet by mouth once a day    Dispense:  45 tablet    Refill:  3   Signed, Jacolyn Reedy, PA-C  03/11/2023 12:22 PM    [facility] [address] [place], [place], [place]  [address] Phone: [phone]; Fax: [phone]

## 2023-03-11 ENCOUNTER — Telehealth: Payer: Self-pay | Admitting: Neurology

## 2023-03-11 ENCOUNTER — Other Ambulatory Visit: Payer: Self-pay | Admitting: Neurology

## 2023-03-11 ENCOUNTER — Ambulatory Visit: Payer: Medicare PPO | Attending: Physician Assistant | Admitting: Physician Assistant

## 2023-03-11 ENCOUNTER — Encounter: Payer: Self-pay | Admitting: Physician Assistant

## 2023-03-11 VITALS — BP 102/76 | HR 68 | Ht 64.0 in | Wt 124.6 lb

## 2023-03-11 DIAGNOSIS — I739 Peripheral vascular disease, unspecified: Secondary | ICD-10-CM

## 2023-03-11 DIAGNOSIS — E785 Hyperlipidemia, unspecified: Secondary | ICD-10-CM

## 2023-03-11 DIAGNOSIS — I25118 Atherosclerotic heart disease of native coronary artery with other forms of angina pectoris: Secondary | ICD-10-CM | POA: Diagnosis not present

## 2023-03-11 DIAGNOSIS — I10 Essential (primary) hypertension: Secondary | ICD-10-CM | POA: Diagnosis not present

## 2023-03-11 MED ORDER — HYDRALAZINE HCL 25 MG PO TABS: ORAL_TABLET | ORAL | 3 refills | Status: AC

## 2023-03-11 NOTE — Telephone Encounter (Signed)
Pt's husband called stating that the pharmacy informed him that the mirtazapine (REMERON) 15 MG tablet was denied and he would like to know why. Please advise.

## 2023-03-11 NOTE — Telephone Encounter (Signed)
Called pt husband back. Informed him that mirtazapine (REMERON) 15 MG tablet was approved today at 12:06 pm. He said thank you I will pick up today.

## 2023-03-11 NOTE — Patient Instructions (Signed)
Medication Instructions:  Decrease Hydralazine to 1/2 a tablet daily   *If you need a refill on your cardiac medications before your next appointment, please call your pharmacy*   Lab Work: None ordered   If you have labs (blood work) drawn today and your tests are completely normal, you will receive your results only by: [place] (if you have MyChart) OR A paper copy in the mail If you have any lab test that is abnormal or we need to change your treatment, we will call you to review the results.   Testing/Procedures: None ordered    Follow-Up: At [facility], you and your health needs are our priority.  As part of our continuing mission to provide you with exceptional heart care, we have created designated Provider Care Teams.  These Care Teams include your primary Cardiologist (physician) and Advanced Practice Providers (APPs -  Physician Assistants and Nurse Practitioners) who all work together to provide you with the care you need, when you need it.  We recommend signing up for the patient portal called "MyChart".  Sign up information is provided on this After Visit Summary.  MyChart is used to connect with patients for Virtual Visits (Telemedicine).  Patients are able to view lab/test results, encounter notes, upcoming appointments, etc.  Non-urgent messages can be sent to your provider as well.   To learn more about what you can do with MyChart, go to [email or web address].    Your next appointment:   12 month(s)  Provider:   Lauree Chandler, MD     Other Instructions

## 2023-03-11 NOTE — Telephone Encounter (Signed)
Hey I see it was approved today at 12:06  For #30 tablets with refills on it. Can you call and let him know it was sent to [place], [place] AT [facility]

## 2023-03-13 ENCOUNTER — Telehealth: Payer: Self-pay | Admitting: Cardiovascular Disease

## 2023-03-13 NOTE — Telephone Encounter (Signed)
Patient was seen by APP on 4/1 and at the time BP was low and APP recommended decrease in hydralazine to 12.5 mg daily.  The patient's husband who is her caregiver checked BP right after breakfast and medications and is concerned w the reading.  132/90.  He is worried that it is too high.  We reviewed the medication decrease but he has not decreased it in her pill box yet.  He is going to decrease to 12.5 mg on Sunday when he refills it for the week.   I asked him to take the BP about 3-4 hours after medications and record it each day.  We talked about if he sees readings consistently greater than 140/90 he should let our office know.  He is appreciative for information provided.

## 2023-03-13 NOTE — Telephone Encounter (Signed)
Patient called to report most recent BP of 126/86.  He wanted [place] to know. He wanted to make sure he still is to break the hydralazine into half and give her 12.5 mg daily.  I asked him to please make the med change that [name] talked w them about and monitor the BP for 1-2 weeks.  He will let us know if getting readings greater than 140/90.

## 2023-03-13 NOTE — Telephone Encounter (Signed)
Pt c/o BP issue: STAT if pt c/o blurred vision, one-sided weakness or slurred speech  1. What are your last 5 BP readings? 132/90 - Today and just finished breakfast and morning medication  2. Are you having any other symptoms (ex. Dizziness, headache, blurred vision, passed out)? No  3. What is your BP issue? Pt's husband stated the they went to an appt on 04/03 and the pt's BP was 102/76 and this morning the BP was reading 132/90 and was wondering if that it too high. Please advise

## 2023-03-17 ENCOUNTER — Telehealth: Payer: Self-pay | Admitting: Neurology

## 2023-03-17 NOTE — Telephone Encounter (Signed)
The Seroquel 25 mg tablet was not denied they have refills on Rx. On 01/21/23 #30 tablet was sent in with 5 refills. We didn't get a refill request from pharmacy recently. It appears it was dispensed from the pharmacy on 03/16/23  She also has refills on Mirtazapine 15 mg tablet that was sent on 03/11/23 for #30 tablets with refills. This was denied on 03/11/23 because this Rx dispensed on 03/11/23   So based on the pharmacy information in epic both medication has been picked up.  Phone room can you call and relay the above.

## 2023-03-17 NOTE — Telephone Encounter (Signed)
Pt husband called stated pharmacy told him to call office because insurance denied  QUEtiapine (SEROQUEL) 25 MG tablet. He is requesting a call from nurse.

## 2023-03-20 ENCOUNTER — Encounter: Payer: Self-pay | Admitting: Physical Medicine and Rehabilitation

## 2023-03-20 ENCOUNTER — Encounter: Payer: Medicare PPO | Attending: Physical Medicine and Rehabilitation | Admitting: Physical Medicine and Rehabilitation

## 2023-03-20 VITALS — BP 117/80 | HR 73 | Ht 64.0 in | Wt 126.0 lb

## 2023-03-20 DIAGNOSIS — F02818 Dementia in other diseases classified elsewhere, unspecified severity, with other behavioral disturbance: Secondary | ICD-10-CM | POA: Diagnosis not present

## 2023-03-20 DIAGNOSIS — R413 Other amnesia: Secondary | ICD-10-CM | POA: Diagnosis not present

## 2023-03-20 DIAGNOSIS — M5416 Radiculopathy, lumbar region: Secondary | ICD-10-CM | POA: Diagnosis not present

## 2023-03-20 DIAGNOSIS — G3183 Dementia with Lewy bodies: Secondary | ICD-10-CM | POA: Diagnosis not present

## 2023-03-20 DIAGNOSIS — R269 Unspecified abnormalities of gait and mobility: Secondary | ICD-10-CM

## 2023-03-20 NOTE — Patient Instructions (Signed)
Pt is a 82 yr old female with hx of L THR in 2017; asthma, COPD; CAD s/p stents; hx of stroke- no weakness; HTN, and HLD; chronic low back pain. 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    Suggest Dr Epimenio Foot try to get Home Health for pt- for additional therapy- felt to be too low level for outpatient anymore and not enough carry over- I have attempted 3 different times to get H/H and not successful to get through insurance, so think Dr Epimenio Foot might have better wording.   2. Suggest saying he's going to do things, and she can join him.   3. The only way to get around topics that she focuses intently on is to try and redirect to a different topic or put it off "til tomorrow".    4.  I think Palliative care could be helpful on how to care for pt as long as possible at home- will place referral.   5. Dr Epimenio Foot stopped Cymbalta- not sure if interacts with any meds, but was helping her back pain with radiculopathy.   6.  Con't Voltaren gel- for topical improvement of pain- is over the counter now.   7. Sounds like having tactile hallucinations- something in mouth, so keeps spitting.  8.  F/U 3 months for Lewy Body dementia

## 2023-03-20 NOTE — Progress Notes (Signed)
Subjective:    Patient ID: Kristine [name], female    DOB: Aug 06, 1941, 82 y.o.   MRN: [id number]  HPI  Pt is a 82 yr old female with hx of L THR in 2017; asthma, COPD; CAD s/p stents; hx of stroke- no weakness; HTN, and HLD; chronic low back pain. 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  Wakes up and wants to go to graduations at the schools or a funeral.   Sounds like still having hallucinations and delusions.   Get sup every 90-120 minutes overnight and during day to void or have BM. No matter what.  Goes through pull ups frequently and has incontinence frequently as well.   But doses a lot at table even when eating.  Gaining weight- was 99 lbs 1 year ago and now 126 lbs- eating all day long- wants heavy carbs- breads, icecream, etc.  Cleans plate.   Thinks a medicine caused the drowsiness.   Back not hurting today- per pt.  Thighs to her knees hurting more-  After rub down, feels better with an ointment and then takes 1-2 days, then hurting again.   Walks a circle in the home- and transition at door ways are different levels- have to step up or down- and knows floor plan  Last done Tuesday/Wednesday and did leg exercises. Doesn't always get to it, doesn't push her to do it, doesn't want to fight.    Spits a lot- and husband concerned she will get dehydrated due to spitting so much.    Pain Inventory Average Pain 9 Pain Right Now 9 My pain is constant and aching  In the last 24 hours, has pain interfered with the following? General activity 10 Relation with others 10 Enjoyment of life 9 What TIME of day is your pain at its worst? morning , daytime, evening, night, and varies Sleep (in  general) Fair  Pain is worse with: walking Pain improves with:  unknown Relief from Meds:  it makes her feel better  Family History  Problem Relation Age of Onset   Peripheral vascular disease Mother    Congestive Heart Failure Mother    Heart attack Father    Social History   Socioeconomic History   Marital status: Married    Spouse name: Cabin crew   Number of children: 3   Years of education: Masters   Highest education level: Not on file  Occupational History   Occupation: Retired  Tobacco Use   Smoking status: Former    Packs/day: 1.00    Years: 45.00    Additional pack years: 0.00    Total pack years: 45.00    Types: Cigarettes    Quit date: 04/07/2010    Years since quitting: 12.9   Smokeless tobacco: Never  Vaping Use   Vaping Use: Never used  Substance and Sexual Activity   Alcohol use: Yes    Comment: rarely    Drug use: No   Sexual activity: Not on file  Other Topics Concern   Not on file  Social History Narrative   Lives with husband, daughter and her son   Right handed   Tea sometimes, decaf coffee   Social Determinants of Health  Financial Resource Strain: Not on file  Food Insecurity: Not on file  Transportation Needs: Not on file  Physical Activity: Not on file  Stress: Not on file  Social Connections: Not on file   Past Surgical History:  Procedure Laterality Date   ABDOMINAL HYSTERECTOMY     Had ovarian resection and required lysis of adhesions   CARDIAC CATHETERIZATION  04/07/2010   EF 60-70%   CORONARY STENTS     x3   ESOPHAGOGASTRODUODENOSCOPY N/A 03/10/2015   Procedure: ESOPHAGOGASTRODUODENOSCOPY (EGD);  Surgeon: Wandalee Ferdinand, MD;  Location: [facility] ENDOSCOPY;  Service: Endoscopy;  Laterality: N/A;   OVARY SURGERY     TOTAL HIP ARTHROPLASTY  2004   left   TOTAL HIP ARTHROPLASTY Right 01/23/2016   Procedure: RIGHT TOTAL HIP ARTHROPLASTY ANTERIOR APPROACH;  Surgeon: Loreta Ave, MD;  Location: [facility] OR;  Service: Orthopedics;  Laterality: Right;    Past Surgical History:  Procedure Laterality Date   ABDOMINAL HYSTERECTOMY     Had ovarian resection and required lysis of adhesions   CARDIAC CATHETERIZATION  04/07/2010   EF 60-70%   CORONARY STENTS     x3   ESOPHAGOGASTRODUODENOSCOPY N/A 03/10/2015   Procedure: ESOPHAGOGASTRODUODENOSCOPY (EGD);  Surgeon: Wandalee Ferdinand, MD;  Location: [facility] ENDOSCOPY;  Service: Endoscopy;  Laterality: N/A;   OVARY SURGERY     TOTAL HIP ARTHROPLASTY  2004   left   TOTAL HIP ARTHROPLASTY Right 01/23/2016   Procedure: RIGHT TOTAL HIP ARTHROPLASTY ANTERIOR APPROACH;  Surgeon: Loreta Ave, MD;  Location: [facility] OR;  Service: Orthopedics;  Laterality: Right;   Past Medical History:  Diagnosis Date   COPD (chronic obstructive pulmonary disease)    Hard of hearing    History of anemia    History of blood transfusion    History of pneumonia    HTN (hypertension)    Hyperlipidemia    Hyperthyroidism    treated wtih radioactive iodine   OA (osteoarthritis of spine)    PVD (peripheral vascular disease)    Lower extremity Dopplers  May 2revealed an ABI of 0.83 in the right posterior    Shortness of breath dyspnea    STEMI (ST elevation myocardial infarction) May 2011   Promus DES in the proximal, mid and distal RCA in May 2011   Stroke    Urinary frequency    Urinary urgency    There were no vitals taken for this visit.  Opioid Risk Score:   Fall Risk Score:  `1  Depression screen [facility] 2/9     05/16/2022    9:35 AM 02/05/2022    9:35 AM  Depression screen PHQ 2/9  Decreased Interest 0 0  Down, Depressed, Hopeless 0 0  PHQ - 2 Score 0 0  Altered sleeping  0  Tired, decreased energy  0  Change in appetite  0  Feeling bad or failure about yourself   0  Trouble concentrating  1  Moving slowly or fidgety/restless  0  Suicidal thoughts  0  PHQ-9 Score  1    Review of Systems  Musculoskeletal:  Positive for back pain and gait problem.       Pain in lower back down to both knees  All other systems  reviewed and are negative.      Objective:   Physical Exam  Talking tangentially, but talking about spring and my dress; in transport w/c; accompanied by husband, NAD Drowsing /falling into light sleep intermittently.  Eyeglasses and masks in place No tremor noted  at rest but her husband, a little tremor intermittently when holding fork, etc. Not debilitating.  Wrapped in blanket.  Less interactive- not talking much after the first few minutes-     Assessment & Plan:   Pt is a 82 yr old female with hx of L THR in 2017; asthma, COPD; CAD s/p stents; hx of stroke- no weakness; HTN, and HLD; chronic low back pain. 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    Suggest Dr Epimenio Foot try to get Home Health for pt- for additional therapy- felt to be too low level for outpatient anymore and not enough carry over- I have attempted 3 different times to get H/H and not successful to get through insurance, so think Dr Epimenio Foot might have better wording.   2. Suggest saying he's going to do things, and she can join him.   3. The only way to get around topics that she focuses intently on is to try and redirect to a different topic or put it off "til tomorrow".    4.  I think Palliative care could be helpful on how to care for pt as long as possible at home- will place referral.   5. Dr Epimenio Foot stopped Cymbalta- not sure if interacts with any meds, but was helping her back pain with radiculopathy.   6.  Con't Voltaren gel- for topical improvement of pain- is over the counter now.   7. Sounds like having tactile hallucinations- something in mouth, so keeps spitting.  8.  F/U 3 months for Lewy Body dementia   I spent a total of  33  minutes on total care today- >50% coordination of care- due to  discussion about back pain; how to keep her at home if at all possible- Palliative care referral and how to  manage pain- educated him that I am not a specialist in Dementia, esp Lewy Body.

## 2023-03-23 ENCOUNTER — Other Ambulatory Visit: Payer: Self-pay

## 2023-03-23 ENCOUNTER — Telehealth: Payer: Self-pay | Admitting: Neurology

## 2023-03-23 DIAGNOSIS — Z515 Encounter for palliative care: Secondary | ICD-10-CM

## 2023-03-23 NOTE — Telephone Encounter (Signed)
Pt's daughter called stating that her father informed her that the pt has started to nap a lot during the day lately after starting the medication she was prescribed but no changes have happened in her sleep during the night. Daughter stated that she would like her father called back so that he can provide more information.

## 2023-03-24 NOTE — Telephone Encounter (Signed)
Dr.Sater I called Mr.Kannan as requested by patient daughter. Mr.Capo states that he has noticed patient is "dozing off" at the breakfast table in am lately. When he calls her name she does wake up and begins to eat. Mr.Kaminsky states pt is no longer kicking at night, but she still does talk during the night while sleep and get up to use the bathroom. He states her sleep is better during the night than before so he has noticed improvement. He provided me was a list of her bedtime medications you prescribed they are Mirtazapine 15 mg tablet, clonazepam 1 mg tablet, donepezil 10 mg tablet.  Pt is also taking atorvastatin 80 mg and risperidone 0.25 mg tablet at bedtime as well ( prescribed by another provider)  He wanted to know if he should be concerned with her "dozing off" during the daytime, I explained that as long as patient is waking up when you call her that's good. She naps during the day, but also get up in middle of night so that is not abnormal to nap during day. I did explain to patient this maybe her new normal due to her condition.   Mr.Preis asked I run all this information by you as well he wants to make sure you don't want to change any of her medications or times pt is taking Rx?  Please advise

## 2023-03-24 NOTE — Telephone Encounter (Signed)
[name] informed "Since she is so sleepy in the morning - have them stop the mirtazapine to see if she is better."  Verbalized he understood.

## 2023-03-26 ENCOUNTER — Other Ambulatory Visit: Payer: Self-pay

## 2023-04-02 ENCOUNTER — Other Ambulatory Visit: Payer: Self-pay

## 2023-04-02 DIAGNOSIS — Z515 Encounter for palliative care: Secondary | ICD-10-CM

## 2023-04-07 ENCOUNTER — Other Ambulatory Visit: Payer: Self-pay

## 2023-04-07 ENCOUNTER — Emergency Department (HOSPITAL_COMMUNITY): Payer: Medicare PPO

## 2023-04-07 ENCOUNTER — Emergency Department (HOSPITAL_COMMUNITY)
Admission: EM | Admit: 2023-04-07 | Discharge: 2023-04-08 | Disposition: A | Discharge status: Home / Self Care | Payer: Medicare PPO | Attending: Emergency Medicine | Admitting: Emergency Medicine

## 2023-04-07 ENCOUNTER — Encounter (HOSPITAL_COMMUNITY): Payer: Self-pay

## 2023-04-07 DIAGNOSIS — Z955 Presence of coronary angioplasty implant and graft: Secondary | ICD-10-CM | POA: Diagnosis not present

## 2023-04-07 DIAGNOSIS — J449 Chronic obstructive pulmonary disease, unspecified: Secondary | ICD-10-CM | POA: Diagnosis not present

## 2023-04-07 DIAGNOSIS — R0789 Other chest pain: Secondary | ICD-10-CM | POA: Diagnosis not present

## 2023-04-07 DIAGNOSIS — R079 Chest pain, unspecified: Secondary | ICD-10-CM | POA: Insufficient documentation

## 2023-04-07 DIAGNOSIS — Z8673 Personal history of transient ischemic attack (TIA), and cerebral infarction without residual deficits: Secondary | ICD-10-CM | POA: Insufficient documentation

## 2023-04-07 DIAGNOSIS — I10 Essential (primary) hypertension: Secondary | ICD-10-CM | POA: Insufficient documentation

## 2023-04-07 DIAGNOSIS — E782 Mixed hyperlipidemia: Secondary | ICD-10-CM | POA: Diagnosis not present

## 2023-04-07 DIAGNOSIS — Z87891 Personal history of nicotine dependence: Secondary | ICD-10-CM | POA: Diagnosis not present

## 2023-04-07 DIAGNOSIS — Z79899 Other long term (current) drug therapy: Secondary | ICD-10-CM | POA: Diagnosis not present

## 2023-04-07 DIAGNOSIS — Z7982 Long term (current) use of aspirin: Secondary | ICD-10-CM | POA: Diagnosis not present

## 2023-04-07 DIAGNOSIS — E1169 Type 2 diabetes mellitus with other specified complication: Secondary | ICD-10-CM | POA: Diagnosis not present

## 2023-04-07 LAB — CBC WITH DIFFERENTIAL/PLATELET
Abs Immature Granulocytes: 0.01 10*3/uL (ref 0.00–0.07)
Basophils Absolute: 0.1 10*3/uL (ref 0.0–0.1)
Basophils Relative: 1 %
Eosinophils Absolute: 0.3 10*3/uL (ref 0.0–0.5)
Eosinophils Relative: 5 %
HCT: 41.4 % (ref 36.0–46.0)
Hemoglobin: 13 g/dL (ref 12.0–15.0)
Immature Granulocytes: 0 %
Lymphocytes Relative: 34 %
Lymphs Abs: 1.9 10*3/uL (ref 0.7–4.0)
MCH: 28.2 pg (ref 26.0–34.0)
MCHC: 31.4 g/dL (ref 30.0–36.0)
MCV: 89.8 fL (ref 80.0–100.0)
Monocytes Absolute: 0.6 10*3/uL (ref 0.1–1.0)
Monocytes Relative: 11 %
Neutro Abs: 2.7 10*3/uL (ref 1.7–7.7)
Neutrophils Relative %: 49 %
Platelets: 230 10*3/uL (ref 150–400)
RBC: 4.61 MIL/uL (ref 3.87–5.11)
RDW: 16.2 % — ABNORMAL HIGH (ref 11.5–15.5)
WBC: 5.6 10*3/uL (ref 4.0–10.5)
nRBC: 0 % (ref 0.0–0.2)

## 2023-04-07 NOTE — ED Provider Notes (Signed)
[facility]-EMERGENCY DEPT Provider Note: Lowella Dell, MD, FACEP  CSN: [id number] MRN: [id number] ARRIVAL: 04/07/23 at 2313 ROOM: WA21/WA21   CHIEF COMPLAINT  Chest Pain  Level 5 caveat: Dementia HISTORY OF PRESENT ILLNESS  04/07/23 11:22 PM Kristine [name] is a 82 y.o. female with a history of STEMI in 2011.  She is here with chest pain that began about an hour ago.  She localizes the chest pain to her left clavicular area.  She is a poor historian due to dementia and her husband provides most of the history.  She describes the pain as something "that did not belong there".  She cannot tell me how severe it was.  She cannot tell me if anything made it better or worse.  She did not have associated shortness of breath, nausea, vomiting or diaphoresis.  The episode lasted between 10 and 20 minutes and she now denies pain.  Her initial blood pressure per the fire department was 112 systolic.  EMS found her blood pressure to be 88/64 but on arrival here her blood pressure is 134/75.  She was given 324 mg of aspirin prior to arrival.   Past Medical History:  Diagnosis Date   COPD (chronic obstructive pulmonary disease) (HCC)    Hard of hearing    History of anemia    History of blood transfusion    History of pneumonia    HTN (hypertension)    Hyperlipidemia    Hyperthyroidism    treated wtih radioactive iodine   OA (osteoarthritis of spine)    PVD (peripheral vascular disease) (HCC)    Lower extremity Dopplers  May 2revealed an ABI of 0.83 in the right posterior    Shortness of breath dyspnea    STEMI (ST elevation myocardial infarction) [facility]) May 2011   Promus DES in the proximal, mid and distal RCA in May 2011   Stroke [facility])    Urinary frequency    Urinary urgency     Past Surgical History:  Procedure Laterality Date   ABDOMINAL HYSTERECTOMY     Had ovarian resection and required lysis of adhesions   CARDIAC CATHETERIZATION  04/07/2010   EF 60-70%   CORONARY STENTS     x3    ESOPHAGOGASTRODUODENOSCOPY N/A 03/10/2015   Procedure: ESOPHAGOGASTRODUODENOSCOPY (EGD);  Surgeon: Wandalee Ferdinand, MD;  Location: [facility] ENDOSCOPY;  Service: Endoscopy;  Laterality: N/A;   OVARY SURGERY     TOTAL HIP ARTHROPLASTY  2004   left   TOTAL HIP ARTHROPLASTY Right 01/23/2016   Procedure: RIGHT TOTAL HIP ARTHROPLASTY ANTERIOR APPROACH;  Surgeon: Loreta Ave, MD;  Location: [facility] OR;  Service: Orthopedics;  Laterality: Right;    Family History  Problem Relation Age of Onset   Peripheral vascular disease Mother    Congestive Heart Failure Mother    Heart attack Father     Social History   Tobacco Use   Smoking status: Former    Packs/day: 1.00    Years: 45.00    Additional pack years: 0.00    Total pack years: 45.00    Types: Cigarettes    Quit date: 04/07/2010    Years since quitting: 13.0   Smokeless tobacco: Never  Vaping Use   Vaping Use: Never used  Substance Use Topics   Alcohol use: Yes    Comment: rarely    Drug use: No    Prior to Admission medications   Medication Sig Start Date End Date Taking? Authorizing Provider  aspirin EC 81 MG  tablet Take 81 mg by mouth daily.   Yes [provider]  BREZTRI AEROSPHERE 160-9-4.8 MCG/ACT AERO Inhale 2 puffs into the lungs 2 (two) times daily. 10/17/20  Yes [provider]  clonazePAM (KLONOPIN) 1 MG tablet 1.5 pills po qHS Patient taking differently: Take 1.5 mg by mouth at bedtime. 1.5 pills po qHS 02/26/23  Yes Sater, Pearletha Furl, MD  donepezil (ARICEPT) 10 MG tablet TAKE 1 TABLET(5 MG) BY MOUTH AT BEDTIME 02/11/23  Yes Sater, Pearletha Furl, MD  hydrALAZINE (APRESOLINE) 25 MG tablet Take 1/2 a tablet by mouth once a day Patient taking differently: Take 12.5 mg by mouth daily. Take 1/2 a tablet by mouth once a day 03/11/23  Yes Dyann Kief, PA-C  hydrochlorothiazide (HYDRODIURIL) 25 MG tablet TAKE 1 TABLET(25 MG) BY MOUTH DAILY 02/02/23  Yes Kathleene Hazel, MD  hydrOXYzine (ATARAX) 10 MG tablet Take 1 or 2  tablets by mouth twice a day as needed for agitation 01/22/23  Yes Sater, Pearletha Furl, MD  levothyroxine (SYNTHROID) 50 MCG tablet Take 50 mcg by mouth every morning. 05/20/22  Yes [provider]  metoprolol succinate (TOPROL-XL) 50 MG 24 hr tablet TAKE 1 TABLET(50 MG) BY MOUTH DAILY 02/02/23  Yes Kathleene Hazel, MD  mirtazapine (REMERON) 15 MG tablet TAKE 1/2 TO 1 TABLET BY MOUTH EVERY NIGHT AT BEDTIME Patient taking differently: Take 7.5 mg by mouth at bedtime. 03/11/23  Yes Sater, Pearletha Furl, MD  nitroGLYCERIN (NITROSTAT) 0.3 MG SL tablet Place 1 tablet (0.3 mg total) under the tongue every 5 (five) minutes as needed for chest pain. 01/02/22  Yes Kathleene Hazel, MD  QUEtiapine (SEROQUEL) 25 MG tablet Take 1 tablet (25 mg total) by mouth at bedtime. 02/19/23  Yes Sater, Pearletha Furl, MD  risperiDONE (RISPERDAL) 0.25 MG tablet Take 1 tablet (0.25 mg total) by mouth 2 (two) times daily as needed. Secondary to Lewy Body Dementia 12/17/22  Yes Lovorn, Aundra Millet, MD  atorvastatin (LIPITOR) 80 MG tablet Take 1 tablet (80 mg total) by mouth daily. Please keep upcoming appt.with provider to receive future refills. Thank You. 12/13/22   Swinyer, Zachary George, NP    Allergies Ace inhibitors, Iodinated contrast media, Other, Shellfish-derived products, and Hydrocodone   REVIEW OF SYSTEMS  Negative except as noted here or in the History of Present Illness.   PHYSICAL EXAMINATION  Initial Vital Signs Blood pressure 134/75, pulse 66, temperature 98.3 F (36.8 C), temperature source Oral, resp. rate 16, height 5\' 4"  (1.626 m), weight 57.6 kg, SpO2 99 %.  Examination General: Well-developed, cachectic female in no acute distress; appearance consistent with age of record HENT: normocephalic; atraumatic Eyes: pupils equal, round and reactive to light; extraocular muscles intact; arcus senilis bilaterally Neck: supple Heart: regular rate and rhythm Lungs: clear to auscultation  bilaterally Abdomen: soft; nondistended; nontender; bowel sounds present Extremities: No deformity; full range of motion; pulses normal Neurologic: Awake, alert; motor function intact in all extremities and symmetric; no facial droop Skin: Warm and dry Psychiatric: Normal mood and affect   RESULTS  Summary of this visit's results, reviewed and interpreted by myself:   EKG Interpretation  Date/Time:  Tuesday April 07 2023 23:13:09 EDT Ventricular Rate:  66 PR Interval:  182 QRS Duration: 97 QT Interval:  429 QTC Calculation: 450 R Axis:   51 Text Interpretation: Sinus rhythm Left atrial enlargement Borderline T abnormalities, anterior leads No significant change was found Confirmed by Paula Libra ([id number]) on 04/07/2023 11:20:32 PM  Laboratory Studies: Results for orders placed or performed during the hospital encounter of 04/07/23 (from the past 24 hour(s))  CBC with Differential     Status: Abnormal   Collection Time: 04/07/23 11:47 PM  Result Value Ref Range   WBC 5.6 4.0 - 10.5 K/uL   RBC 4.61 3.87 - 5.11 MIL/uL   Hemoglobin 13.0 12.0 - 15.0 g/dL   HCT 09.8 11.9 - 14.7 %   MCV 89.8 80.0 - 100.0 fL   MCH 28.2 26.0 - 34.0 pg   MCHC 31.4 30.0 - 36.0 g/dL   RDW 82.9 (H) 56.2 - 13.0 %   Platelets 230 150 - 400 K/uL   nRBC 0.0 0.0 - 0.2 %   Neutrophils Relative % 49 %   Neutro Abs 2.7 1.7 - 7.7 K/uL   Lymphocytes Relative 34 %   Lymphs Abs 1.9 0.7 - 4.0 K/uL   Monocytes Relative 11 %   Monocytes Absolute 0.6 0.1 - 1.0 K/uL   Eosinophils Relative 5 %   Eosinophils Absolute 0.3 0.0 - 0.5 K/uL   Basophils Relative 1 %   Basophils Absolute 0.1 0.0 - 0.1 K/uL   Immature Granulocytes 0 %   Abs Immature Granulocytes 0.01 0.00 - 0.07 K/uL  Troponin I (High Sensitivity)     Status: None   Collection Time: 04/07/23 11:47 PM  Result Value Ref Range   Troponin I (High Sensitivity) 4 <18 ng/L  Basic metabolic panel     Status: Abnormal   Collection Time: 04/07/23 11:47 PM   Result Value Ref Range   Sodium 138 135 - 145 mmol/L   Potassium 3.4 (L) 3.5 - 5.1 mmol/L   Chloride 102 98 - 111 mmol/L   CO2 28 22 - 32 mmol/L   Glucose, Bld 120 (H) 70 - 99 mg/dL   BUN 21 8 - 23 mg/dL   Creatinine, Ser 8.65 (H) 0.44 - 1.00 mg/dL   Calcium 9.1 8.9 - 78.4 mg/dL   GFR, Estimated 55 (L) >60 mL/min   Anion gap 8 5 - 15  Troponin I (High Sensitivity)     Status: None   Collection Time: 04/08/23  3:17 AM  Result Value Ref Range   Troponin I (High Sensitivity) 3 <18 ng/L   Imaging Studies: DG Chest Port 1 View  Result Date: 04/08/2023 CLINICAL DATA:  Chest pain EXAM: PORTABLE CHEST 1 VIEW COMPARISON:  03/06/2023 FINDINGS: Cardiac shadow is stable. Aortic calcifications are noted. The lungs are well aerated bilaterally. No focal infiltrate is seen. No bony abnormality is noted. IMPRESSION: No active disease. Electronically Signed   By: Alcide Clever M.D.   On: 04/08/2023 00:03    ED COURSE and MDM  Nursing notes, initial and subsequent vitals signs, including pulse oximetry, reviewed and interpreted by myself.  Vitals:   04/07/23 2315 04/07/23 2319 04/08/23 0107 04/08/23 0300  BP: 134/75  122/70 113/66  Pulse: 66  67 61  Resp: 16  16 13   Temp: 98.3 F (36.8 C)  97.7 F (36.5 C)   TempSrc: Oral     SpO2: 99%  100% 97%  Weight:  57.6 kg    Height:  5\' 4"  (1.626 m)     Medications - No data to display  4:53 AM Chest pain was atypical in nature.  She had no acute EKG changes and her troponins are well within normal limits and affect downtrending.  I believe she is safe for discharge home.  PROCEDURES  Procedures   ED  DIAGNOSES     ICD-10-CM   1. Nonspecific chest pain  R07.9          Isael Stille, Jonny Ruiz, MD 04/08/23 [phone]

## 2023-04-07 NOTE — ED Triage Notes (Signed)
Pt had 4 baby ASA PTA

## 2023-04-07 NOTE — ED Triage Notes (Signed)
Chest pain PTA around 2230. Episode lasted between 10-20 minutes. EMS came out and pt husband wanted to transport pt. Pt BP was 88/64 with EMS, now is normal. Pt denies chest pain on arrival, pt has dementia.

## 2023-04-08 ENCOUNTER — Telehealth: Payer: Self-pay | Admitting: Cardiovascular Disease

## 2023-04-08 ENCOUNTER — Other Ambulatory Visit: Payer: Self-pay

## 2023-04-08 DIAGNOSIS — I25118 Atherosclerotic heart disease of native coronary artery with other forms of angina pectoris: Secondary | ICD-10-CM

## 2023-04-08 DIAGNOSIS — E782 Mixed hyperlipidemia: Secondary | ICD-10-CM

## 2023-04-08 LAB — URINALYSIS, ROUTINE W REFLEX MICROSCOPIC
Bacteria, UA: NONE SEEN
Bilirubin Urine: NEGATIVE
Glucose, UA: NEGATIVE mg/dL
Hgb urine dipstick: NEGATIVE
Ketones, ur: NEGATIVE mg/dL
Nitrite: NEGATIVE
Protein, ur: NEGATIVE mg/dL
Specific Gravity, Urine: 1.02 (ref 1.005–1.030)
pH: 5 (ref 5.0–8.0)

## 2023-04-08 LAB — BASIC METABOLIC PANEL
Anion gap: 8 (ref 5–15)
BUN: 21 mg/dL (ref 8–23)
CO2: 28 mmol/L (ref 22–32)
Calcium: 9.1 mg/dL (ref 8.9–10.3)
Chloride: 102 mmol/L (ref 98–111)
Creatinine, Ser: 1.03 mg/dL — ABNORMAL HIGH (ref 0.44–1.00)
GFR, Estimated: 55 mL/min — ABNORMAL LOW (ref >60)
Glucose, Bld: 120 mg/dL — ABNORMAL HIGH (ref 70–99)
Potassium: 3.4 mmol/L — ABNORMAL LOW (ref 3.5–5.1)
Sodium: 138 mmol/L (ref 135–145)

## 2023-04-08 LAB — TROPONIN I (HIGH SENSITIVITY)
Troponin I (High Sensitivity): 3 ng/L (ref <18)
Troponin I (High Sensitivity): 4 ng/L (ref <18)

## 2023-04-08 MED ORDER — NITROGLYCERIN 0.3 MG SL SUBL: 0.3000 mg | SUBLINGUAL_TABLET | SUBLINGUAL | 3 refills | Status: AC | PRN

## 2023-04-08 MED ORDER — ATORVASTATIN CALCIUM 80 MG PO TABS: 80.0000 mg | ORAL_TABLET | Freq: Every day | ORAL | 3 refills | Status: AC

## 2023-04-08 MED ORDER — EZETIMIBE 10 MG PO TABS: 10.0000 mg | ORAL_TABLET | Freq: Every day | ORAL | 3 refills | Status: AC

## 2023-04-08 NOTE — Telephone Encounter (Signed)
*  STAT* If patient is at the pharmacy, call can be transferred to refill team.   1. Which medications need to be refilled? (please list name of each medication and dose if known)  ezetimibe (ZETIA) tablet 10 mg  nitroGLYCERIN (NITROSTAT) 0.3 MG SL tablet   2. Which pharmacy/location (including street and city if local pharmacy) is medication to be sent to? [id number] - , [place] - [address] AT [address]   3. Do they need a 30 day or 90 day supply? 90 day supply   Only has 5 tablets of zetia left.

## 2023-04-08 NOTE — Telephone Encounter (Signed)
I talked w patient's husband.  She was in the ER overnight.  No follow up with cardiology was recommended.  I adv him to speak w PCP re: follow up.  He said she has not had any complaints today.  Had breakfast and was outside pulling weeds with their granddaughter this morning.  Refills of ezetimbe and ntg sent per request.

## 2023-04-08 NOTE — Progress Notes (Addendum)
Transition of Care [facility]) - Emergency Department Mini Assessment   Patient Details  Name: Kristine [name] MRN: [id number] Date of Birth: [birth year]  Transition of Care [facility]) CM/SW Contact:    Princella Ion, LCSW Phone Number: 04/08/2023, 8:10 AM   Clinical Narrative: This CSW spoke to pt's spouse and inquired about HH/DME needs. Pt's spouse states he doesn't need anyone to come into the home daily, just a few times a week. This CSW explained the [facility] process. Pt's spouse stated he is interested if [facility] will pay. This CSW inquired about the pt going to rehab and pt's spouse denied stating "you have to give up everything and if I do that, then I'll be homeless." This CSW explained STR process is submitted through insurance. Pt's spouse then reports being overwhelmed with the amount of calls he's received and states everyone is looking for them to pay money. This CSW reiterated that our process for [facility] is to submit through insurance. Pt's spouse verbalized understanding.   This CSW has outreached to [facility] for PT/OT/Aide. Iantha Fallen has accepted the pt at this time. TOC to notify pt's spouse.    ED Mini Assessment: What brought you to the Emergency Department? : chest pain  Barriers to Discharge: No Barriers Identified     Means of departure: Car       Patient Contact and Communications Key Contact 1: Gholson,Colen (Spouse) [phone]   Spoke with: Colen (spouse) Contact Date: 04/08/23,   Contact time: 0759 Contact Phone Number: [phone] Call outcome: Interested in [facility] if insurance will cover         Admission diagnosis:  Chest pain Patient Active Problem List   Diagnosis Date Noted   Lewy body dementia without behavioral disturbance, psychotic disturbance, mood disturbance, or anxiety (HCC) 06/02/2022   Acute memory impairment 05/16/2022   Impaired functional mobility, balance, and endurance 05/16/2022   Chronic left-sided low back pain with left-sided  sciatica 02/05/2022   Lumbar radiculopathy 02/05/2022   Chest pain 05/14/2021   Somniloquy 04/08/2021   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   Snoring 12/19/2020   Excessive daytime sleepiness 12/19/2020   Gait disturbance 12/19/2020   Pain due to onychomycosis of toenails of both feet 05/25/2019   Porokeratosis 05/25/2019   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   Angioedema 05/31/2015   Fever 03/11/2015   Acute blood loss anemia 03/11/2015   Melena 03/10/2015   GI bleed 03/09/2015   Peripheral arterial disease (HCC) 03/01/2012   CAD (coronary artery disease) 01/20/2012   HTN (hypertension)    Hyperlipidemia    PVD (peripheral vascular disease) (HCC)    COPD (chronic obstructive pulmonary disease) (HCC)    Hyperthyroidism    OA (osteoarthritis of spine)    STEMI (ST elevation myocardial infarction) (HCC) 04/07/2010   PCP:  Renaye Rakers, MD Pharmacy:   [facility] [address] [place] - [address] [place] [address] Phone: [phone] Fax: [phone]  [id number] [place], [place] - [address] AT [place] [address] [place] [address] Phone: [phone] Fax: [phone]  [facility] [place] [place] - [address], [address] [place] [address] Phone: [phone] Fax: [phone]  [facility] #[id number] - [place], [place] - [address] AT [facility] [address] [place] [address] Phone: [phone] Fax: [phone]

## 2023-04-08 NOTE — ED Notes (Addendum)
Pt husband crying in the room stating "I can't do this anymore. I don't sleep, I feed her, clean her, make sure she takes her pills. My family works and can't help out. We both had to quit our part time job and I've had it up to here.

## 2023-04-08 NOTE — Progress Notes (Addendum)
This CSW spoke to pt and spouse at bedside to inform of home health agency acceptance. Pt's spouse prefers to be contacted at his home number. This CSW informed pt and spouse that [facility] agency [facility] will reach out to him within 24-48 hours. Pt's spouse verbalized that pt has had HH PT in the past. Provided spouse with AVS that includes [facility] agency information. No further TOC needs at this time.

## 2023-04-08 NOTE — Discharge Instructions (Addendum)
YOUR HOME HEALTH AGENCY IS:  [facility] Contact Number: [phone]

## 2023-04-08 NOTE — Telephone Encounter (Signed)
Patient's spouse is calling requesting a callback from a nurse regarding patient's ED visit yesterday as well as being advised on her old nitroglycerin. Please advise.

## 2023-04-09 DIAGNOSIS — M479 Spondylosis, unspecified: Secondary | ICD-10-CM | POA: Diagnosis not present

## 2023-04-09 DIAGNOSIS — J449 Chronic obstructive pulmonary disease, unspecified: Secondary | ICD-10-CM | POA: Diagnosis not present

## 2023-04-09 DIAGNOSIS — F0280 Dementia in other diseases classified elsewhere without behavioral disturbance: Secondary | ICD-10-CM | POA: Diagnosis not present

## 2023-04-09 DIAGNOSIS — I739 Peripheral vascular disease, unspecified: Secondary | ICD-10-CM | POA: Diagnosis not present

## 2023-04-09 DIAGNOSIS — R079 Chest pain, unspecified: Secondary | ICD-10-CM | POA: Diagnosis not present

## 2023-04-09 DIAGNOSIS — I2510 Atherosclerotic heart disease of native coronary artery without angina pectoris: Secondary | ICD-10-CM | POA: Diagnosis not present

## 2023-04-09 DIAGNOSIS — G3183 Dementia with Lewy bodies: Secondary | ICD-10-CM | POA: Diagnosis not present

## 2023-04-09 DIAGNOSIS — I119 Hypertensive heart disease without heart failure: Secondary | ICD-10-CM | POA: Diagnosis not present

## 2023-04-09 DIAGNOSIS — E785 Hyperlipidemia, unspecified: Secondary | ICD-10-CM | POA: Diagnosis not present

## 2023-04-10 NOTE — Progress Notes (Signed)
COMMUNITY PALLIATIVE CARE SW NOTE  PATIENT NAME: Kristine [name] DOB: 12/31/1940 MRN: [id number]  PRIMARY CARE PROVIDER: Renaye Rakers, MD  RESPONSIBLE PARTY:  Acct ID - Guarantor Home Phone Work Phone Relationship Acct Type  [id number] ALEXANNA, WADDEL* [phone]  Self P/F     [address], [place], [place] [address]   Initial Palliative Care Encounter  PC SW provided education to patient's husband-Colon. He advised that patient has dementia. She eats and drinks well, but does cough afterwards. She wears undergarments/Depends due to incontinence. She is sleeping more during the day. He would like additional in-home care, PT to improve patient's walking and the goal is to keep her at home.   SW provided education regarding mobile meals. SW also encouraged the husband to call patient's insurance [facility]) to see if there are any in-home care available as part of her plan. SW provided  a few in-home resources for caregiver agencies. Colon insists that they do not have the financial resources to hire anyone. SW advised that he would need to talk with patient's daughter about PT services.   SW scheduled a follow-up visit for 04/23/23 with the palliative care nurse.    Social History   Tobacco Use   Smoking status: Former    Packs/day: 1.00    Years: 45.00    Additional pack years: 0.00    Total pack years: 45.00    Types: Cigarettes    Quit date: 04/07/2010    Years since quitting: 13.0   Smokeless tobacco: Never  Substance Use Topics   Alcohol use: Yes    Comment: rarely     CODE STATUS: Full Code ADVANCED DIRECTIVES: No, but education provided MOST FORM COMPLETE: No, but education provided HOSPICE EDUCATION PROVIDED: No  Duration of visit and documentation: 30 minutes  [employer], LCSW

## 2023-04-14 DIAGNOSIS — E785 Hyperlipidemia, unspecified: Secondary | ICD-10-CM | POA: Diagnosis not present

## 2023-04-14 DIAGNOSIS — I119 Hypertensive heart disease without heart failure: Secondary | ICD-10-CM | POA: Diagnosis not present

## 2023-04-14 DIAGNOSIS — I739 Peripheral vascular disease, unspecified: Secondary | ICD-10-CM | POA: Diagnosis not present

## 2023-04-14 DIAGNOSIS — G3183 Dementia with Lewy bodies: Secondary | ICD-10-CM | POA: Diagnosis not present

## 2023-04-14 DIAGNOSIS — R079 Chest pain, unspecified: Secondary | ICD-10-CM | POA: Diagnosis not present

## 2023-04-14 DIAGNOSIS — F0280 Dementia in other diseases classified elsewhere without behavioral disturbance: Secondary | ICD-10-CM | POA: Diagnosis not present

## 2023-04-14 DIAGNOSIS — I2510 Atherosclerotic heart disease of native coronary artery without angina pectoris: Secondary | ICD-10-CM | POA: Diagnosis not present

## 2023-04-14 DIAGNOSIS — M479 Spondylosis, unspecified: Secondary | ICD-10-CM | POA: Diagnosis not present

## 2023-04-14 DIAGNOSIS — J449 Chronic obstructive pulmonary disease, unspecified: Secondary | ICD-10-CM | POA: Diagnosis not present

## 2023-04-15 DIAGNOSIS — I2510 Atherosclerotic heart disease of native coronary artery without angina pectoris: Secondary | ICD-10-CM | POA: Diagnosis not present

## 2023-04-15 DIAGNOSIS — R079 Chest pain, unspecified: Secondary | ICD-10-CM | POA: Diagnosis not present

## 2023-04-15 DIAGNOSIS — I119 Hypertensive heart disease without heart failure: Secondary | ICD-10-CM | POA: Diagnosis not present

## 2023-04-15 DIAGNOSIS — M479 Spondylosis, unspecified: Secondary | ICD-10-CM | POA: Diagnosis not present

## 2023-04-15 DIAGNOSIS — J449 Chronic obstructive pulmonary disease, unspecified: Secondary | ICD-10-CM | POA: Diagnosis not present

## 2023-04-15 DIAGNOSIS — E785 Hyperlipidemia, unspecified: Secondary | ICD-10-CM | POA: Diagnosis not present

## 2023-04-15 DIAGNOSIS — F0280 Dementia in other diseases classified elsewhere without behavioral disturbance: Secondary | ICD-10-CM | POA: Diagnosis not present

## 2023-04-15 DIAGNOSIS — I739 Peripheral vascular disease, unspecified: Secondary | ICD-10-CM | POA: Diagnosis not present

## 2023-04-15 DIAGNOSIS — G3183 Dementia with Lewy bodies: Secondary | ICD-10-CM | POA: Diagnosis not present

## 2023-04-16 DIAGNOSIS — R079 Chest pain, unspecified: Secondary | ICD-10-CM | POA: Diagnosis not present

## 2023-04-16 DIAGNOSIS — M479 Spondylosis, unspecified: Secondary | ICD-10-CM | POA: Diagnosis not present

## 2023-04-16 DIAGNOSIS — I739 Peripheral vascular disease, unspecified: Secondary | ICD-10-CM | POA: Diagnosis not present

## 2023-04-16 DIAGNOSIS — F0280 Dementia in other diseases classified elsewhere without behavioral disturbance: Secondary | ICD-10-CM | POA: Diagnosis not present

## 2023-04-16 DIAGNOSIS — I119 Hypertensive heart disease without heart failure: Secondary | ICD-10-CM | POA: Diagnosis not present

## 2023-04-16 DIAGNOSIS — G3183 Dementia with Lewy bodies: Secondary | ICD-10-CM | POA: Diagnosis not present

## 2023-04-16 DIAGNOSIS — J449 Chronic obstructive pulmonary disease, unspecified: Secondary | ICD-10-CM | POA: Diagnosis not present

## 2023-04-16 DIAGNOSIS — I2510 Atherosclerotic heart disease of native coronary artery without angina pectoris: Secondary | ICD-10-CM | POA: Diagnosis not present

## 2023-04-16 DIAGNOSIS — E785 Hyperlipidemia, unspecified: Secondary | ICD-10-CM | POA: Diagnosis not present

## 2023-04-20 DIAGNOSIS — I739 Peripheral vascular disease, unspecified: Secondary | ICD-10-CM | POA: Diagnosis not present

## 2023-04-20 DIAGNOSIS — J449 Chronic obstructive pulmonary disease, unspecified: Secondary | ICD-10-CM | POA: Diagnosis not present

## 2023-04-20 DIAGNOSIS — E785 Hyperlipidemia, unspecified: Secondary | ICD-10-CM | POA: Diagnosis not present

## 2023-04-20 DIAGNOSIS — I119 Hypertensive heart disease without heart failure: Secondary | ICD-10-CM | POA: Diagnosis not present

## 2023-04-20 DIAGNOSIS — R079 Chest pain, unspecified: Secondary | ICD-10-CM | POA: Diagnosis not present

## 2023-04-20 DIAGNOSIS — F0280 Dementia in other diseases classified elsewhere without behavioral disturbance: Secondary | ICD-10-CM | POA: Diagnosis not present

## 2023-04-20 DIAGNOSIS — M479 Spondylosis, unspecified: Secondary | ICD-10-CM | POA: Diagnosis not present

## 2023-04-20 DIAGNOSIS — G3183 Dementia with Lewy bodies: Secondary | ICD-10-CM | POA: Diagnosis not present

## 2023-04-20 DIAGNOSIS — I2510 Atherosclerotic heart disease of native coronary artery without angina pectoris: Secondary | ICD-10-CM | POA: Diagnosis not present

## 2023-04-20 NOTE — Progress Notes (Signed)
COMMUNITY PALLIATIVE CARE SW NOTE  PATIENT NAME: Kristine [name] DOB: 04-17-1941 MRN: [id number]  PRIMARY CARE PROVIDER: Renaye Rakers, MD  RESPONSIBLE PARTY:  Acct ID - Guarantor Home Phone Work Phone Relationship Acct Type  [id number] GARNETTA, DARCO* [phone]  Self P/F     [address], [place], [place] [address]   Initial Palliative Care Telephonic Encounter/Clinical Social Work   [employer] SW completed an initial telephonic encounter with patient's husband-Colon. SW provided education/introduction to palliative care services, role in patient's care, visit frequency, billing and contact information was provided. [name] provided an initial verbal consent to services and provided a status update on patient.  Patient has dementia. She is eating and drinking well, but often cough following liquids. Patient wears undergarments as she is incontinent. He is sleeping more during the day, but is up 4 am. He is in need of home care services and possible therapy. SW encouraged him to follow-up with his insurance to see what services are available to them in home. He shared that he is having financial issues where he is unable to pay for in-home help. Patient has Medicare through [facility]. Patient is also a Paediatric nurse.   Social History   Tobacco Use   Smoking status: Former    Packs/day: 1.00    Years: 45.00    Additional pack years: 0.00    Total pack years: 45.00    Types: Cigarettes    Quit date: 04/07/2010    Years since quitting: 13.0   Smokeless tobacco: Never  Substance Use Topics   Alcohol use: Yes    Comment: rarely     CODE STATUS: Full Code ADVANCED DIRECTIVES: No, but interested in completing. MOST FORM COMPLETE: No, but interested in completing HOSPICE EDUCATION PROVIDED: No  Duration of encounter and documentation: 30 minutes  Next scheduled visit visit: 04/02/23 @ 2 pm   Clydia Llano, LCSW

## 2023-04-21 DIAGNOSIS — J449 Chronic obstructive pulmonary disease, unspecified: Secondary | ICD-10-CM | POA: Diagnosis not present

## 2023-04-21 DIAGNOSIS — G3183 Dementia with Lewy bodies: Secondary | ICD-10-CM | POA: Diagnosis not present

## 2023-04-21 DIAGNOSIS — F0280 Dementia in other diseases classified elsewhere without behavioral disturbance: Secondary | ICD-10-CM | POA: Diagnosis not present

## 2023-04-21 DIAGNOSIS — I119 Hypertensive heart disease without heart failure: Secondary | ICD-10-CM | POA: Diagnosis not present

## 2023-04-21 DIAGNOSIS — M479 Spondylosis, unspecified: Secondary | ICD-10-CM | POA: Diagnosis not present

## 2023-04-21 DIAGNOSIS — R079 Chest pain, unspecified: Secondary | ICD-10-CM | POA: Diagnosis not present

## 2023-04-21 DIAGNOSIS — E785 Hyperlipidemia, unspecified: Secondary | ICD-10-CM | POA: Diagnosis not present

## 2023-04-21 DIAGNOSIS — I2510 Atherosclerotic heart disease of native coronary artery without angina pectoris: Secondary | ICD-10-CM | POA: Diagnosis not present

## 2023-04-21 DIAGNOSIS — I739 Peripheral vascular disease, unspecified: Secondary | ICD-10-CM | POA: Diagnosis not present

## 2023-04-22 DIAGNOSIS — I739 Peripheral vascular disease, unspecified: Secondary | ICD-10-CM | POA: Diagnosis not present

## 2023-04-22 DIAGNOSIS — E785 Hyperlipidemia, unspecified: Secondary | ICD-10-CM | POA: Diagnosis not present

## 2023-04-22 DIAGNOSIS — J449 Chronic obstructive pulmonary disease, unspecified: Secondary | ICD-10-CM | POA: Diagnosis not present

## 2023-04-22 DIAGNOSIS — G3183 Dementia with Lewy bodies: Secondary | ICD-10-CM | POA: Diagnosis not present

## 2023-04-22 DIAGNOSIS — M479 Spondylosis, unspecified: Secondary | ICD-10-CM | POA: Diagnosis not present

## 2023-04-22 DIAGNOSIS — I119 Hypertensive heart disease without heart failure: Secondary | ICD-10-CM | POA: Diagnosis not present

## 2023-04-22 DIAGNOSIS — F0280 Dementia in other diseases classified elsewhere without behavioral disturbance: Secondary | ICD-10-CM | POA: Diagnosis not present

## 2023-04-22 DIAGNOSIS — R079 Chest pain, unspecified: Secondary | ICD-10-CM | POA: Diagnosis not present

## 2023-04-22 DIAGNOSIS — I2510 Atherosclerotic heart disease of native coronary artery without angina pectoris: Secondary | ICD-10-CM | POA: Diagnosis not present

## 2023-04-23 ENCOUNTER — Other Ambulatory Visit: Payer: Self-pay

## 2023-04-24 DIAGNOSIS — I119 Hypertensive heart disease without heart failure: Secondary | ICD-10-CM | POA: Diagnosis not present

## 2023-04-24 DIAGNOSIS — E785 Hyperlipidemia, unspecified: Secondary | ICD-10-CM | POA: Diagnosis not present

## 2023-04-24 DIAGNOSIS — R079 Chest pain, unspecified: Secondary | ICD-10-CM | POA: Diagnosis not present

## 2023-04-24 DIAGNOSIS — J449 Chronic obstructive pulmonary disease, unspecified: Secondary | ICD-10-CM | POA: Diagnosis not present

## 2023-04-24 DIAGNOSIS — I2510 Atherosclerotic heart disease of native coronary artery without angina pectoris: Secondary | ICD-10-CM | POA: Diagnosis not present

## 2023-04-24 DIAGNOSIS — M479 Spondylosis, unspecified: Secondary | ICD-10-CM | POA: Diagnosis not present

## 2023-04-24 DIAGNOSIS — G3183 Dementia with Lewy bodies: Secondary | ICD-10-CM | POA: Diagnosis not present

## 2023-04-24 DIAGNOSIS — I739 Peripheral vascular disease, unspecified: Secondary | ICD-10-CM | POA: Diagnosis not present

## 2023-04-24 DIAGNOSIS — F0280 Dementia in other diseases classified elsewhere without behavioral disturbance: Secondary | ICD-10-CM | POA: Diagnosis not present

## 2023-04-27 DIAGNOSIS — J449 Chronic obstructive pulmonary disease, unspecified: Secondary | ICD-10-CM | POA: Diagnosis not present

## 2023-04-27 DIAGNOSIS — R079 Chest pain, unspecified: Secondary | ICD-10-CM | POA: Diagnosis not present

## 2023-04-27 DIAGNOSIS — E785 Hyperlipidemia, unspecified: Secondary | ICD-10-CM | POA: Diagnosis not present

## 2023-04-27 DIAGNOSIS — I2510 Atherosclerotic heart disease of native coronary artery without angina pectoris: Secondary | ICD-10-CM | POA: Diagnosis not present

## 2023-04-27 DIAGNOSIS — I119 Hypertensive heart disease without heart failure: Secondary | ICD-10-CM | POA: Diagnosis not present

## 2023-04-27 DIAGNOSIS — G3183 Dementia with Lewy bodies: Secondary | ICD-10-CM | POA: Diagnosis not present

## 2023-04-27 DIAGNOSIS — F0280 Dementia in other diseases classified elsewhere without behavioral disturbance: Secondary | ICD-10-CM | POA: Diagnosis not present

## 2023-04-27 DIAGNOSIS — I739 Peripheral vascular disease, unspecified: Secondary | ICD-10-CM | POA: Diagnosis not present

## 2023-04-27 DIAGNOSIS — M479 Spondylosis, unspecified: Secondary | ICD-10-CM | POA: Diagnosis not present

## 2023-04-28 DIAGNOSIS — E785 Hyperlipidemia, unspecified: Secondary | ICD-10-CM | POA: Diagnosis not present

## 2023-04-28 DIAGNOSIS — I2510 Atherosclerotic heart disease of native coronary artery without angina pectoris: Secondary | ICD-10-CM | POA: Diagnosis not present

## 2023-04-28 DIAGNOSIS — G3183 Dementia with Lewy bodies: Secondary | ICD-10-CM | POA: Diagnosis not present

## 2023-04-28 DIAGNOSIS — I119 Hypertensive heart disease without heart failure: Secondary | ICD-10-CM | POA: Diagnosis not present

## 2023-04-28 DIAGNOSIS — R079 Chest pain, unspecified: Secondary | ICD-10-CM | POA: Diagnosis not present

## 2023-04-28 DIAGNOSIS — J449 Chronic obstructive pulmonary disease, unspecified: Secondary | ICD-10-CM | POA: Diagnosis not present

## 2023-04-28 DIAGNOSIS — I739 Peripheral vascular disease, unspecified: Secondary | ICD-10-CM | POA: Diagnosis not present

## 2023-04-28 DIAGNOSIS — F0280 Dementia in other diseases classified elsewhere without behavioral disturbance: Secondary | ICD-10-CM | POA: Diagnosis not present

## 2023-04-28 DIAGNOSIS — M479 Spondylosis, unspecified: Secondary | ICD-10-CM | POA: Diagnosis not present

## 2023-04-29 DIAGNOSIS — I739 Peripheral vascular disease, unspecified: Secondary | ICD-10-CM | POA: Diagnosis not present

## 2023-04-29 DIAGNOSIS — E785 Hyperlipidemia, unspecified: Secondary | ICD-10-CM | POA: Diagnosis not present

## 2023-04-29 DIAGNOSIS — I2510 Atherosclerotic heart disease of native coronary artery without angina pectoris: Secondary | ICD-10-CM | POA: Diagnosis not present

## 2023-04-29 DIAGNOSIS — F0280 Dementia in other diseases classified elsewhere without behavioral disturbance: Secondary | ICD-10-CM | POA: Diagnosis not present

## 2023-04-29 DIAGNOSIS — G3183 Dementia with Lewy bodies: Secondary | ICD-10-CM | POA: Diagnosis not present

## 2023-04-29 DIAGNOSIS — R079 Chest pain, unspecified: Secondary | ICD-10-CM | POA: Diagnosis not present

## 2023-04-29 DIAGNOSIS — J449 Chronic obstructive pulmonary disease, unspecified: Secondary | ICD-10-CM | POA: Diagnosis not present

## 2023-04-29 DIAGNOSIS — M479 Spondylosis, unspecified: Secondary | ICD-10-CM | POA: Diagnosis not present

## 2023-04-29 DIAGNOSIS — I119 Hypertensive heart disease without heart failure: Secondary | ICD-10-CM | POA: Diagnosis not present

## 2023-04-30 DIAGNOSIS — R079 Chest pain, unspecified: Secondary | ICD-10-CM | POA: Diagnosis not present

## 2023-04-30 DIAGNOSIS — I2510 Atherosclerotic heart disease of native coronary artery without angina pectoris: Secondary | ICD-10-CM | POA: Diagnosis not present

## 2023-04-30 DIAGNOSIS — I119 Hypertensive heart disease without heart failure: Secondary | ICD-10-CM | POA: Diagnosis not present

## 2023-04-30 DIAGNOSIS — G3183 Dementia with Lewy bodies: Secondary | ICD-10-CM | POA: Diagnosis not present

## 2023-04-30 DIAGNOSIS — M479 Spondylosis, unspecified: Secondary | ICD-10-CM | POA: Diagnosis not present

## 2023-04-30 DIAGNOSIS — J449 Chronic obstructive pulmonary disease, unspecified: Secondary | ICD-10-CM | POA: Diagnosis not present

## 2023-04-30 DIAGNOSIS — E785 Hyperlipidemia, unspecified: Secondary | ICD-10-CM | POA: Diagnosis not present

## 2023-04-30 DIAGNOSIS — I739 Peripheral vascular disease, unspecified: Secondary | ICD-10-CM | POA: Diagnosis not present

## 2023-04-30 DIAGNOSIS — F0280 Dementia in other diseases classified elsewhere without behavioral disturbance: Secondary | ICD-10-CM | POA: Diagnosis not present

## 2023-05-06 DIAGNOSIS — I739 Peripheral vascular disease, unspecified: Secondary | ICD-10-CM | POA: Diagnosis not present

## 2023-05-06 DIAGNOSIS — I2510 Atherosclerotic heart disease of native coronary artery without angina pectoris: Secondary | ICD-10-CM | POA: Diagnosis not present

## 2023-05-06 DIAGNOSIS — M479 Spondylosis, unspecified: Secondary | ICD-10-CM | POA: Diagnosis not present

## 2023-05-06 DIAGNOSIS — F0280 Dementia in other diseases classified elsewhere without behavioral disturbance: Secondary | ICD-10-CM | POA: Diagnosis not present

## 2023-05-06 DIAGNOSIS — J449 Chronic obstructive pulmonary disease, unspecified: Secondary | ICD-10-CM | POA: Diagnosis not present

## 2023-05-06 DIAGNOSIS — R079 Chest pain, unspecified: Secondary | ICD-10-CM | POA: Diagnosis not present

## 2023-05-06 DIAGNOSIS — I119 Hypertensive heart disease without heart failure: Secondary | ICD-10-CM | POA: Diagnosis not present

## 2023-05-06 DIAGNOSIS — G3183 Dementia with Lewy bodies: Secondary | ICD-10-CM | POA: Diagnosis not present

## 2023-05-06 DIAGNOSIS — E785 Hyperlipidemia, unspecified: Secondary | ICD-10-CM | POA: Diagnosis not present

## 2023-05-07 DIAGNOSIS — M479 Spondylosis, unspecified: Secondary | ICD-10-CM | POA: Diagnosis not present

## 2023-05-07 DIAGNOSIS — F0280 Dementia in other diseases classified elsewhere without behavioral disturbance: Secondary | ICD-10-CM | POA: Diagnosis not present

## 2023-05-07 DIAGNOSIS — I119 Hypertensive heart disease without heart failure: Secondary | ICD-10-CM | POA: Diagnosis not present

## 2023-05-07 DIAGNOSIS — G3183 Dementia with Lewy bodies: Secondary | ICD-10-CM | POA: Diagnosis not present

## 2023-05-07 DIAGNOSIS — J449 Chronic obstructive pulmonary disease, unspecified: Secondary | ICD-10-CM | POA: Diagnosis not present

## 2023-05-07 DIAGNOSIS — R079 Chest pain, unspecified: Secondary | ICD-10-CM | POA: Diagnosis not present

## 2023-05-07 DIAGNOSIS — I2510 Atherosclerotic heart disease of native coronary artery without angina pectoris: Secondary | ICD-10-CM | POA: Diagnosis not present

## 2023-05-07 DIAGNOSIS — I739 Peripheral vascular disease, unspecified: Secondary | ICD-10-CM | POA: Diagnosis not present

## 2023-05-07 DIAGNOSIS — E785 Hyperlipidemia, unspecified: Secondary | ICD-10-CM | POA: Diagnosis not present

## 2023-05-08 DIAGNOSIS — M479 Spondylosis, unspecified: Secondary | ICD-10-CM | POA: Diagnosis not present

## 2023-05-08 DIAGNOSIS — E782 Mixed hyperlipidemia: Secondary | ICD-10-CM | POA: Diagnosis not present

## 2023-05-08 DIAGNOSIS — I739 Peripheral vascular disease, unspecified: Secondary | ICD-10-CM | POA: Diagnosis not present

## 2023-05-08 DIAGNOSIS — J449 Chronic obstructive pulmonary disease, unspecified: Secondary | ICD-10-CM | POA: Diagnosis not present

## 2023-05-08 DIAGNOSIS — G3183 Dementia with Lewy bodies: Secondary | ICD-10-CM | POA: Diagnosis not present

## 2023-05-08 DIAGNOSIS — F01511 Vascular dementia, unspecified severity, with agitation: Secondary | ICD-10-CM | POA: Diagnosis not present

## 2023-05-08 DIAGNOSIS — E785 Hyperlipidemia, unspecified: Secondary | ICD-10-CM | POA: Diagnosis not present

## 2023-05-08 DIAGNOSIS — F0280 Dementia in other diseases classified elsewhere without behavioral disturbance: Secondary | ICD-10-CM | POA: Diagnosis not present

## 2023-05-08 DIAGNOSIS — I2510 Atherosclerotic heart disease of native coronary artery without angina pectoris: Secondary | ICD-10-CM | POA: Diagnosis not present

## 2023-05-08 DIAGNOSIS — I119 Hypertensive heart disease without heart failure: Secondary | ICD-10-CM | POA: Diagnosis not present

## 2023-05-08 DIAGNOSIS — I110 Hypertensive heart disease with heart failure: Secondary | ICD-10-CM | POA: Diagnosis not present

## 2023-05-08 DIAGNOSIS — R079 Chest pain, unspecified: Secondary | ICD-10-CM | POA: Diagnosis not present

## 2023-05-13 DIAGNOSIS — Z90710 Acquired absence of both cervix and uterus: Secondary | ICD-10-CM | POA: Diagnosis not present

## 2023-05-13 DIAGNOSIS — M479 Spondylosis, unspecified: Secondary | ICD-10-CM | POA: Diagnosis not present

## 2023-05-13 DIAGNOSIS — J449 Chronic obstructive pulmonary disease, unspecified: Secondary | ICD-10-CM | POA: Diagnosis not present

## 2023-05-13 DIAGNOSIS — N958 Other specified menopausal and perimenopausal disorders: Secondary | ICD-10-CM | POA: Diagnosis not present

## 2023-05-13 DIAGNOSIS — E785 Hyperlipidemia, unspecified: Secondary | ICD-10-CM | POA: Diagnosis not present

## 2023-05-13 DIAGNOSIS — G3183 Dementia with Lewy bodies: Secondary | ICD-10-CM | POA: Diagnosis not present

## 2023-05-13 DIAGNOSIS — I119 Hypertensive heart disease without heart failure: Secondary | ICD-10-CM | POA: Diagnosis not present

## 2023-05-13 DIAGNOSIS — Z1231 Encounter for screening mammogram for malignant neoplasm of breast: Secondary | ICD-10-CM | POA: Diagnosis not present

## 2023-05-13 DIAGNOSIS — I2510 Atherosclerotic heart disease of native coronary artery without angina pectoris: Secondary | ICD-10-CM | POA: Diagnosis not present

## 2023-05-13 DIAGNOSIS — R079 Chest pain, unspecified: Secondary | ICD-10-CM | POA: Diagnosis not present

## 2023-05-13 DIAGNOSIS — I739 Peripheral vascular disease, unspecified: Secondary | ICD-10-CM | POA: Diagnosis not present

## 2023-05-13 DIAGNOSIS — F0280 Dementia in other diseases classified elsewhere without behavioral disturbance: Secondary | ICD-10-CM | POA: Diagnosis not present

## 2023-05-13 DIAGNOSIS — R29890 Loss of height: Secondary | ICD-10-CM | POA: Diagnosis not present

## 2023-05-15 DIAGNOSIS — I119 Hypertensive heart disease without heart failure: Secondary | ICD-10-CM | POA: Diagnosis not present

## 2023-05-15 DIAGNOSIS — R079 Chest pain, unspecified: Secondary | ICD-10-CM | POA: Diagnosis not present

## 2023-05-15 DIAGNOSIS — J449 Chronic obstructive pulmonary disease, unspecified: Secondary | ICD-10-CM | POA: Diagnosis not present

## 2023-05-15 DIAGNOSIS — M479 Spondylosis, unspecified: Secondary | ICD-10-CM | POA: Diagnosis not present

## 2023-05-15 DIAGNOSIS — G3183 Dementia with Lewy bodies: Secondary | ICD-10-CM | POA: Diagnosis not present

## 2023-05-15 DIAGNOSIS — I739 Peripheral vascular disease, unspecified: Secondary | ICD-10-CM | POA: Diagnosis not present

## 2023-05-15 DIAGNOSIS — I2510 Atherosclerotic heart disease of native coronary artery without angina pectoris: Secondary | ICD-10-CM | POA: Diagnosis not present

## 2023-05-15 DIAGNOSIS — E785 Hyperlipidemia, unspecified: Secondary | ICD-10-CM | POA: Diagnosis not present

## 2023-05-15 DIAGNOSIS — F0280 Dementia in other diseases classified elsewhere without behavioral disturbance: Secondary | ICD-10-CM | POA: Diagnosis not present

## 2023-05-18 DIAGNOSIS — I119 Hypertensive heart disease without heart failure: Secondary | ICD-10-CM | POA: Diagnosis not present

## 2023-05-18 DIAGNOSIS — J449 Chronic obstructive pulmonary disease, unspecified: Secondary | ICD-10-CM | POA: Diagnosis not present

## 2023-05-18 DIAGNOSIS — M479 Spondylosis, unspecified: Secondary | ICD-10-CM | POA: Diagnosis not present

## 2023-05-18 DIAGNOSIS — I2510 Atherosclerotic heart disease of native coronary artery without angina pectoris: Secondary | ICD-10-CM | POA: Diagnosis not present

## 2023-05-18 DIAGNOSIS — F0280 Dementia in other diseases classified elsewhere without behavioral disturbance: Secondary | ICD-10-CM | POA: Diagnosis not present

## 2023-05-18 DIAGNOSIS — I739 Peripheral vascular disease, unspecified: Secondary | ICD-10-CM | POA: Diagnosis not present

## 2023-05-18 DIAGNOSIS — E785 Hyperlipidemia, unspecified: Secondary | ICD-10-CM | POA: Diagnosis not present

## 2023-05-18 DIAGNOSIS — G3183 Dementia with Lewy bodies: Secondary | ICD-10-CM | POA: Diagnosis not present

## 2023-05-18 DIAGNOSIS — R079 Chest pain, unspecified: Secondary | ICD-10-CM | POA: Diagnosis not present

## 2023-05-19 DIAGNOSIS — E039 Hypothyroidism, unspecified: Secondary | ICD-10-CM | POA: Diagnosis not present

## 2023-05-19 DIAGNOSIS — F01511 Vascular dementia, unspecified severity, with agitation: Secondary | ICD-10-CM | POA: Diagnosis not present

## 2023-05-19 DIAGNOSIS — E782 Mixed hyperlipidemia: Secondary | ICD-10-CM | POA: Diagnosis not present

## 2023-05-19 DIAGNOSIS — R600 Localized edema: Secondary | ICD-10-CM | POA: Diagnosis not present

## 2023-05-19 DIAGNOSIS — E1169 Type 2 diabetes mellitus with other specified complication: Secondary | ICD-10-CM | POA: Diagnosis not present

## 2023-05-19 DIAGNOSIS — I10 Essential (primary) hypertension: Secondary | ICD-10-CM | POA: Diagnosis not present

## 2023-05-21 DIAGNOSIS — I119 Hypertensive heart disease without heart failure: Secondary | ICD-10-CM | POA: Diagnosis not present

## 2023-05-21 DIAGNOSIS — I2510 Atherosclerotic heart disease of native coronary artery without angina pectoris: Secondary | ICD-10-CM | POA: Diagnosis not present

## 2023-05-21 DIAGNOSIS — R079 Chest pain, unspecified: Secondary | ICD-10-CM | POA: Diagnosis not present

## 2023-05-21 DIAGNOSIS — E785 Hyperlipidemia, unspecified: Secondary | ICD-10-CM | POA: Diagnosis not present

## 2023-05-21 DIAGNOSIS — G3183 Dementia with Lewy bodies: Secondary | ICD-10-CM | POA: Diagnosis not present

## 2023-05-21 DIAGNOSIS — M479 Spondylosis, unspecified: Secondary | ICD-10-CM | POA: Diagnosis not present

## 2023-05-21 DIAGNOSIS — J449 Chronic obstructive pulmonary disease, unspecified: Secondary | ICD-10-CM | POA: Diagnosis not present

## 2023-05-21 DIAGNOSIS — F0280 Dementia in other diseases classified elsewhere without behavioral disturbance: Secondary | ICD-10-CM | POA: Diagnosis not present

## 2023-05-21 DIAGNOSIS — I739 Peripheral vascular disease, unspecified: Secondary | ICD-10-CM | POA: Diagnosis not present

## 2023-05-25 DIAGNOSIS — G3183 Dementia with Lewy bodies: Secondary | ICD-10-CM | POA: Diagnosis not present

## 2023-05-25 DIAGNOSIS — I119 Hypertensive heart disease without heart failure: Secondary | ICD-10-CM | POA: Diagnosis not present

## 2023-05-25 DIAGNOSIS — F0280 Dementia in other diseases classified elsewhere without behavioral disturbance: Secondary | ICD-10-CM | POA: Diagnosis not present

## 2023-05-25 DIAGNOSIS — E785 Hyperlipidemia, unspecified: Secondary | ICD-10-CM | POA: Diagnosis not present

## 2023-05-25 DIAGNOSIS — I2510 Atherosclerotic heart disease of native coronary artery without angina pectoris: Secondary | ICD-10-CM | POA: Diagnosis not present

## 2023-05-25 DIAGNOSIS — M479 Spondylosis, unspecified: Secondary | ICD-10-CM | POA: Diagnosis not present

## 2023-05-25 DIAGNOSIS — I739 Peripheral vascular disease, unspecified: Secondary | ICD-10-CM | POA: Diagnosis not present

## 2023-05-25 DIAGNOSIS — J449 Chronic obstructive pulmonary disease, unspecified: Secondary | ICD-10-CM | POA: Diagnosis not present

## 2023-05-25 DIAGNOSIS — R079 Chest pain, unspecified: Secondary | ICD-10-CM | POA: Diagnosis not present

## 2023-05-28 DIAGNOSIS — F0280 Dementia in other diseases classified elsewhere without behavioral disturbance: Secondary | ICD-10-CM | POA: Diagnosis not present

## 2023-05-28 DIAGNOSIS — E785 Hyperlipidemia, unspecified: Secondary | ICD-10-CM | POA: Diagnosis not present

## 2023-05-28 DIAGNOSIS — G3183 Dementia with Lewy bodies: Secondary | ICD-10-CM | POA: Diagnosis not present

## 2023-05-28 DIAGNOSIS — I739 Peripheral vascular disease, unspecified: Secondary | ICD-10-CM | POA: Diagnosis not present

## 2023-05-28 DIAGNOSIS — M479 Spondylosis, unspecified: Secondary | ICD-10-CM | POA: Diagnosis not present

## 2023-05-28 DIAGNOSIS — R079 Chest pain, unspecified: Secondary | ICD-10-CM | POA: Diagnosis not present

## 2023-05-28 DIAGNOSIS — I119 Hypertensive heart disease without heart failure: Secondary | ICD-10-CM | POA: Diagnosis not present

## 2023-05-28 DIAGNOSIS — I2510 Atherosclerotic heart disease of native coronary artery without angina pectoris: Secondary | ICD-10-CM | POA: Diagnosis not present

## 2023-05-28 DIAGNOSIS — J449 Chronic obstructive pulmonary disease, unspecified: Secondary | ICD-10-CM | POA: Diagnosis not present

## 2023-06-04 DIAGNOSIS — R079 Chest pain, unspecified: Secondary | ICD-10-CM | POA: Diagnosis not present

## 2023-06-04 DIAGNOSIS — I2510 Atherosclerotic heart disease of native coronary artery without angina pectoris: Secondary | ICD-10-CM | POA: Diagnosis not present

## 2023-06-04 DIAGNOSIS — G3183 Dementia with Lewy bodies: Secondary | ICD-10-CM | POA: Diagnosis not present

## 2023-06-04 DIAGNOSIS — F0280 Dementia in other diseases classified elsewhere without behavioral disturbance: Secondary | ICD-10-CM | POA: Diagnosis not present

## 2023-06-04 DIAGNOSIS — M479 Spondylosis, unspecified: Secondary | ICD-10-CM | POA: Diagnosis not present

## 2023-06-04 DIAGNOSIS — E785 Hyperlipidemia, unspecified: Secondary | ICD-10-CM | POA: Diagnosis not present

## 2023-06-04 DIAGNOSIS — I739 Peripheral vascular disease, unspecified: Secondary | ICD-10-CM | POA: Diagnosis not present

## 2023-06-04 DIAGNOSIS — J449 Chronic obstructive pulmonary disease, unspecified: Secondary | ICD-10-CM | POA: Diagnosis not present

## 2023-06-04 DIAGNOSIS — I119 Hypertensive heart disease without heart failure: Secondary | ICD-10-CM | POA: Diagnosis not present

## 2023-06-07 ENCOUNTER — Emergency Department (HOSPITAL_COMMUNITY): Payer: Medicare PPO

## 2023-06-07 ENCOUNTER — Encounter (HOSPITAL_COMMUNITY): Payer: Self-pay | Admitting: Radiology

## 2023-06-07 ENCOUNTER — Emergency Department (HOSPITAL_COMMUNITY)
Admission: EM | Admit: 2023-06-07 | Discharge: 2023-06-09 | Disposition: A | Discharge status: Home / Self Care | Payer: Medicare PPO | Attending: Emergency Medicine | Admitting: Emergency Medicine

## 2023-06-07 DIAGNOSIS — F0390 Unspecified dementia without behavioral disturbance: Secondary | ICD-10-CM | POA: Insufficient documentation

## 2023-06-07 DIAGNOSIS — R531 Weakness: Secondary | ICD-10-CM | POA: Diagnosis not present

## 2023-06-07 DIAGNOSIS — R930 Abnormal findings on diagnostic imaging of skull and head, not elsewhere classified: Secondary | ICD-10-CM | POA: Diagnosis not present

## 2023-06-07 DIAGNOSIS — Z8673 Personal history of transient ischemic attack (TIA), and cerebral infarction without residual deficits: Secondary | ICD-10-CM | POA: Diagnosis not present

## 2023-06-07 DIAGNOSIS — Z79899 Other long term (current) drug therapy: Secondary | ICD-10-CM | POA: Diagnosis not present

## 2023-06-07 DIAGNOSIS — Z7982 Long term (current) use of aspirin: Secondary | ICD-10-CM | POA: Insufficient documentation

## 2023-06-07 DIAGNOSIS — R627 Adult failure to thrive: Secondary | ICD-10-CM | POA: Insufficient documentation

## 2023-06-07 DIAGNOSIS — I10 Essential (primary) hypertension: Secondary | ICD-10-CM | POA: Diagnosis not present

## 2023-06-07 DIAGNOSIS — R4182 Altered mental status, unspecified: Secondary | ICD-10-CM | POA: Diagnosis not present

## 2023-06-07 DIAGNOSIS — E039 Hypothyroidism, unspecified: Secondary | ICD-10-CM | POA: Diagnosis not present

## 2023-06-07 LAB — BASIC METABOLIC PANEL
Anion gap: 8 (ref 5–15)
BUN: 11 mg/dL (ref 8–23)
CO2: 26 mmol/L (ref 22–32)
Calcium: 9.1 mg/dL (ref 8.9–10.3)
Chloride: 105 mmol/L (ref 98–111)
Creatinine, Ser: 0.88 mg/dL (ref 0.44–1.00)
GFR, Estimated: >60 mL/min (ref >60)
Glucose, Bld: 133 mg/dL — ABNORMAL HIGH (ref 70–99)
Potassium: 3.2 mmol/L — ABNORMAL LOW (ref 3.5–5.1)
Sodium: 139 mmol/L (ref 135–145)

## 2023-06-07 LAB — TSH: TSH: 0.673 u[IU]/mL (ref 0.350–4.500)

## 2023-06-07 LAB — TROPONIN I (HIGH SENSITIVITY)
Troponin I (High Sensitivity): 12 ng/L (ref <18)
Troponin I (High Sensitivity): 12 ng/L (ref <18)

## 2023-06-07 LAB — CBC WITH DIFFERENTIAL/PLATELET
Abs Immature Granulocytes: 0.01 10*3/uL (ref 0.00–0.07)
Basophils Absolute: 0.1 10*3/uL (ref 0.0–0.1)
Basophils Relative: 1 %
Eosinophils Absolute: 0.2 10*3/uL (ref 0.0–0.5)
Eosinophils Relative: 3 %
HCT: 46.7 % — ABNORMAL HIGH (ref 36.0–46.0)
Hemoglobin: 14.8 g/dL (ref 12.0–15.0)
Immature Granulocytes: 0 %
Lymphocytes Relative: 29 %
Lymphs Abs: 1.3 10*3/uL (ref 0.7–4.0)
MCH: 28.2 pg (ref 26.0–34.0)
MCHC: 31.7 g/dL (ref 30.0–36.0)
MCV: 89.1 fL (ref 80.0–100.0)
Monocytes Absolute: 0.3 10*3/uL (ref 0.1–1.0)
Monocytes Relative: 8 %
Neutro Abs: 2.7 10*3/uL (ref 1.7–7.7)
Neutrophils Relative %: 59 %
Platelets: 239 10*3/uL (ref 150–400)
RBC: 5.24 MIL/uL — ABNORMAL HIGH (ref 3.87–5.11)
RDW: 15.6 % — ABNORMAL HIGH (ref 11.5–15.5)
WBC: 4.5 10*3/uL (ref 4.0–10.5)
nRBC: 0 % (ref 0.0–0.2)

## 2023-06-07 LAB — URINALYSIS, ROUTINE W REFLEX MICROSCOPIC
Bacteria, UA: NONE SEEN
Bilirubin Urine: NEGATIVE
Glucose, UA: NEGATIVE mg/dL
Ketones, ur: NEGATIVE mg/dL
Nitrite: NEGATIVE
Protein, ur: NEGATIVE mg/dL
Specific Gravity, Urine: 1.016 (ref 1.005–1.030)
pH: 5 (ref 5.0–8.0)

## 2023-06-07 LAB — AMMONIA: Ammonia: <10 umol/L (ref 9–35)

## 2023-06-07 MED ORDER — QUETIAPINE FUMARATE 25 MG PO TABS
25.0000 mg | ORAL_TABLET | Freq: Every day | ORAL | Status: DC
  Administered 2023-06-07 – 2023-06-08 (×2): 25 mg via ORAL
  Filled 2023-06-07 (×2): qty 1

## 2023-06-07 MED ORDER — HYDRALAZINE HCL 25 MG PO TABS
12.5000 mg | ORAL_TABLET | Freq: Every day | ORAL | Status: DC
  Administered 2023-06-08 – 2023-06-09 (×2): 12.5 mg via ORAL
  Filled 2023-06-07 (×2): qty 1

## 2023-06-07 MED ORDER — DONEPEZIL HCL 5 MG PO TABS
10.0000 mg | ORAL_TABLET | Freq: Every day | ORAL | Status: DC
  Administered 2023-06-07 – 2023-06-08 (×2): 10 mg via ORAL
  Filled 2023-06-07 (×2): qty 2

## 2023-06-07 MED ORDER — LEVOTHYROXINE SODIUM 50 MCG PO TABS
50.0000 ug | ORAL_TABLET | ORAL | Status: DC
  Administered 2023-06-08: 50 ug via ORAL
  Filled 2023-06-07: qty 1

## 2023-06-07 MED ORDER — HYDRALAZINE HCL 25 MG PO TABS: 12.5000 mg | ORAL_TABLET | Freq: Every day | ORAL | Status: DC

## 2023-06-07 MED ORDER — METOPROLOL SUCCINATE ER 25 MG PO TB24: 25.0000 mg | ORAL_TABLET | ORAL | Status: DC

## 2023-06-07 MED ORDER — METOPROLOL SUCCINATE ER 50 MG PO TB24
25.0000 mg | ORAL_TABLET | Freq: Every day | ORAL | Status: DC
  Administered 2023-06-08 – 2023-06-09 (×2): 25 mg via ORAL
  Filled 2023-06-07 (×2): qty 1

## 2023-06-07 MED ORDER — LEVOTHYROXINE SODIUM 50 MCG PO TABS: 50.0000 ug | ORAL_TABLET | ORAL | Status: DC

## 2023-06-07 MED ORDER — ATORVASTATIN CALCIUM 40 MG PO TABS
80.0000 mg | ORAL_TABLET | Freq: Every day | ORAL | Status: DC
  Administered 2023-06-07 – 2023-06-09 (×2): 80 mg via ORAL
  Filled 2023-06-07 (×3): qty 2

## 2023-06-07 MED ORDER — ASPIRIN 81 MG PO TBEC
81.0000 mg | DELAYED_RELEASE_TABLET | Freq: Every day | ORAL | Status: DC
  Administered 2023-06-08 – 2023-06-09 (×2): 81 mg via ORAL
  Filled 2023-06-07 (×2): qty 1

## 2023-06-07 MED ORDER — RISPERIDONE 0.5 MG PO TABS: 0.2500 mg | ORAL_TABLET | Freq: Two times a day (BID) | ORAL | Status: DC | PRN

## 2023-06-07 MED ORDER — HYDROXYZINE HCL 10 MG PO TABS: 10.0000 mg | ORAL_TABLET | Freq: Two times a day (BID) | ORAL | Status: DC | PRN

## 2023-06-07 MED ORDER — MIRTAZAPINE 7.5 MG PO TABS
7.5000 mg | ORAL_TABLET | Freq: Every day | ORAL | Status: DC
  Administered 2023-06-07 – 2023-06-08 (×2): 7.5 mg via ORAL
  Filled 2023-06-07 (×2): qty 1

## 2023-06-07 MED ORDER — ASPIRIN 81 MG PO TBEC: 81.0000 mg | DELAYED_RELEASE_TABLET | Freq: Every day | ORAL | Status: DC

## 2023-06-07 MED ORDER — CLONAZEPAM 1 MG PO TABS
1.5000 mg | ORAL_TABLET | Freq: Every day | ORAL | Status: DC
  Administered 2023-06-07 – 2023-06-08 (×2): 1.5 mg via ORAL
  Filled 2023-06-07: qty 3
  Filled 2023-06-07: qty 1

## 2023-06-07 MED ORDER — DONEPEZIL HCL 5 MG PO TABS: 10.0000 mg | ORAL_TABLET | ORAL | Status: DC

## 2023-06-07 NOTE — ED Provider Notes (Signed)
Pt signed out by Dr. Renaye Rakers pending urine.  UA nl.  Family tells me that she can normally walk with a cane and has not been able to walk since 6/24.  They have been having to physically pick her up to get her to the bathroom, etc.  I will add on a MRI.  If this is normal, she will need to be a TOC boarder for rehab placement as family unable to take care of her.  MRI reviewed by me.  I agree with the radiologist.  MRI: No acute intracranial process. No evidence of acute or subacute  infarct.   Pt has no finding to admit to the hospital.  I will put her into a TOC hold and have pt eval in the am.  Home meds and diet ordered.   Jacalyn Lefevre, MD 06/07/23 [phone]

## 2023-06-07 NOTE — ED Notes (Signed)
Pt daughter Sheral Flow  [phone], requesting updates on pt POC, daughter wants to be notified tomorrow prior to social work arrival so can be present.

## 2023-06-07 NOTE — ED Provider Notes (Signed)
[place] EMERGENCY DEPARTMENT AT [facility] Provider Note   CSN: [id number] Arrival date & time: 06/07/23  1342     History {Add pertinent medical, surgical, social history, OB history to HPI:1} Chief Complaint  Patient presents with   Weakness   Failure To Thrive    Kristine [name] is a 82 y.o. female with history of lewy body dementia hypothyroidism presenting to the ED with concern for worsening weakness.  Patient presents by EMS.  I spoke to [place] [employer] her husband by phone, who reports concern with the patient's mobility, reporting a rapid decline in her mobility over the past 3 days.  He said prior to that she was in physical therapy and able to walk with a cane, but now is barely able to get up.  This was his primary concern in bringing her to the hospital  Her husband reports that her thyroid medication was cut in half, or stagger to every other day, in the past month, based on her blood work.  Patient is level 5 caveat due to dementia  HPI     Home Medications Prior to Admission medications   Medication Sig Start Date End Date Taking? Authorizing Provider  aspirin EC 81 MG tablet Take 81 mg by mouth daily.   Yes [provider]  atorvastatin (LIPITOR) 80 MG tablet Take 1 tablet (80 mg total) by mouth daily. 04/08/23  Yes Kathleene Hazel, MD  BREZTRI AEROSPHERE 160-9-4.8 MCG/ACT AERO Inhale 2 puffs into the lungs 2 (two) times daily. 10/17/20  Yes [provider]  clonazePAM (KLONOPIN) 1 MG tablet 1.5 pills po qHS Patient taking differently: Take 1.5 mg by mouth at bedtime. 1.5 pills po qHS 02/26/23  Yes Sater, Pearletha Furl, MD  donepezil (ARICEPT) 10 MG tablet TAKE 1 TABLET(5 MG) BY MOUTH AT BEDTIME Patient taking differently: Take 10 mg by mouth See admin instructions. TAKE 1 TABLET(5 MG) BY MOUTH AT BEDTIME 02/11/23  Yes Sater, Pearletha Furl, MD  ezetimibe (ZETIA) 10 MG tablet Take 1 tablet (10 mg total) by mouth daily. 04/08/23  Yes  Kathleene Hazel, MD  hydrALAZINE (APRESOLINE) 25 MG tablet Take 1/2 a tablet by mouth once a day Patient taking differently: Take 12.5 mg by mouth daily. 03/11/23  Yes Dyann Kief, PA-C  hydrochlorothiazide (HYDRODIURIL) 25 MG tablet TAKE 1 TABLET(25 MG) BY MOUTH DAILY Patient taking differently: Take 25 mg by mouth daily. 02/02/23  Yes Kathleene Hazel, MD  hydrOXYzine (ATARAX) 10 MG tablet Take 1 or 2 tablets by mouth twice a day as needed for agitation 01/22/23  Yes Sater, Pearletha Furl, MD  levothyroxine (SYNTHROID) 50 MCG tablet Take 50 mcg by mouth every other day. 05/20/22  Yes [provider]  metoprolol succinate (TOPROL-XL) 50 MG 24 hr tablet TAKE 1 TABLET(50 MG) BY MOUTH DAILY Patient taking differently: Take 25 mg by mouth See admin instructions. 02/02/23  Yes Kathleene Hazel, MD  mirtazapine (REMERON) 15 MG tablet TAKE 1/2 TO 1 TABLET BY MOUTH EVERY NIGHT AT BEDTIME Patient taking differently: Take 7.5 mg by mouth at bedtime. 03/11/23  Yes Sater, Pearletha Furl, MD  nitroGLYCERIN (NITROSTAT) 0.3 MG SL tablet Place 1 tablet (0.3 mg total) under the tongue every 5 (five) minutes as needed for chest pain. Max 3 doses 5 minutes apart per episode of chest pain. Call 911 if need 3rd dose. 04/08/23  Yes Kathleene Hazel, MD  QUEtiapine (SEROQUEL) 25 MG tablet Take 1 tablet (25 mg total)  by mouth at bedtime. 02/19/23  Yes Sater, Pearletha Furl, MD  risperiDONE (RISPERDAL) 0.25 MG tablet Take 1 tablet (0.25 mg total) by mouth 2 (two) times daily as needed. Secondary to Lewy Body Dementia 12/17/22  Yes Lovorn, Aundra Millet, MD      Allergies    Ace inhibitors, Iodinated contrast media, Other, Shellfish-derived products, and Hydrocodone    Review of Systems   Review of Systems  Physical Exam Updated Vital Signs BP (!) 162/82   Pulse 71   Temp 98.5 F (36.9 C) (Oral)   Resp 18   SpO2 96%  Physical Exam Constitutional:      General: She is not in acute distress. HENT:      Head: Normocephalic and atraumatic.  Eyes:     Conjunctiva/sclera: Conjunctivae normal.     Pupils: Pupils are equal, round, and reactive to light.  Cardiovascular:     Rate and Rhythm: Normal rate and regular rhythm.  Pulmonary:     Effort: Pulmonary effort is normal. No respiratory distress.  Abdominal:     General: There is no distension.     Tenderness: There is no abdominal tenderness.  Musculoskeletal:     Comments: Full range of motion of the bilateral hips, weak effort with leg raise in the lower extremities,  Skin:    General: Skin is warm and dry.  Neurological:     General: No focal deficit present.     Mental Status: She is alert. Mental status is at baseline.     ED Results / Procedures / Treatments   Labs (all labs ordered are listed, but only abnormal results are displayed) Labs Reviewed  BASIC METABOLIC PANEL - Abnormal; Notable for the following components:      Result Value   Potassium 3.2 (*)    Glucose, Bld 133 (*)    All other components within normal limits  CBC WITH DIFFERENTIAL/PLATELET - Abnormal; Notable for the following components:   RBC 5.24 (*)    HCT 46.7 (*)    RDW 15.6 (*)    All other components within normal limits  AMMONIA  URINALYSIS, ROUTINE W REFLEX MICROSCOPIC  TSH  TROPONIN I (HIGH SENSITIVITY)    EKG EKG Interpretation Date/Time:  Sunday June 07 2023 14:12:25 EDT Ventricular Rate:  74 PR Interval:  188 QRS Duration:  147 QT Interval:  389 QTC Calculation: 435 R Axis:   69  Text Interpretation: Sinus rhythm Biatrial enlargement Artifact in lead(s) I II aVR aVL aVF Confirmed by Alvester Chou [phone]) on 06/07/2023 2:20:25 PM  Radiology CT Head Wo Contrast  Result Date: 06/07/2023 CLINICAL DATA:  Altered mental status EXAM: CT HEAD WITHOUT CONTRAST TECHNIQUE: Contiguous axial images were obtained from the base of the skull through the vertex without intravenous contrast. RADIATION DOSE REDUCTION: This exam was performed  according to the departmental dose-optimization program which includes automated exposure control, adjustment of the mA and/or kV according to patient size and/or use of iterative reconstruction technique. COMPARISON:  04/02/2022 FINDINGS: Brain: No acute intracranial findings are seen. There are no signs of bleeding within the cranium. Ventricles are nondilated. Cortical sulci are prominent. Vascular: Unremarkable. Skull: No acute findings are seen. Sinuses/Orbits: Unremarkable. Other: Sella appears enlarged. There is increased amount of CSF in the sella suggesting empty sella with no significant interval change. IMPRESSION: No acute intracranial findings are seen.  Atrophy. Electronically Signed   By: Ernie Avena M.D.   On: 06/07/2023 14:39    Procedures Procedures  {Document cardiac  monitor, telemetry assessment procedure when appropriate:1}  Medications Ordered in ED Medications - No data to display  ED Course/ Medical Decision Making/ A&P   {   Click here for ABCD2, HEART and other calculatorsREFRESH Note before signing :1}                          Medical Decision Making Amount and/or Complexity of Data Reviewed Labs: ordered. Radiology: ordered. ECG/medicine tests: ordered.   This patient presents to the ED with concern for abruptly worsening generalized weakness over the past 3 days.. This involves an extensive number of treatment options, and is a complaint that carries with it a high risk of complications and morbidity.  The differential diagnosis includes progressive Lewy body dementia versus dehydration versus anemia versus infection versus other  NIH stroke scale 0 on arrival and there are no localizing stroke symptoms, however she does give a poor effort on motor exam, and CT imaging of the brain would be reasonable.  Co-morbidities that complicate the patient evaluation: History of Lewy body dementia at high risk of progressive motor deficits  Additional history  obtained from EMS and the patient's husband by phone  External records from outside source obtained and reviewed including neurology office evaluation and reported history of Lewy body dementia  I ordered and personally interpreted labs.  The pertinent results include:  labs pending at signout  I ordered imaging studies including CT scan of the head I independently visualized and interpreted imaging which showed no emergent findings I agree with the radiologist interpretation  The patient was maintained on a cardiac monitor.  I personally viewed and interpreted the cardiac monitored which showed an underlying rhythm of: NSR  Per my interpretation the patient's ECG shows baseline movement artifact, on evident ischemic findings  I have reviewed the patients home medicines and have made adjustments as needed  Test Considered: doubt acute PE, meningitis  After the interventions noted above, I reevaluated the patient and found that they have: stayed the same   Dispostion:  Patient is signed out at 3pm to Dr Jacalyn Lefevre EDP pending follow up on labs and remaining workup.   {Document critical care time when appropriate:1} {Document review of labs and clinical decision tools ie heart score, Chads2Vasc2 etc:1}  {Document your independent review of radiology images, and any outside records:1} {Document your discussion with family members, caretakers, and with consultants:1} {Document social determinants of health affecting pt's care:1} {Document your decision making why or why not admission, treatments were needed:1} Final Clinical Impression(s) / ED Diagnoses Final diagnoses:  Weakness    Rx / DC Orders ED Discharge Orders     None

## 2023-06-07 NOTE — ED Triage Notes (Signed)
Pt to ED via [facility] from home, c/o generalized weakness/ failure to thrive x 1 week. Pt family concerned that pt is needing more help ambulating/ eating/caring for self. Reports this occurred after medication changes, reports changes to thyroid and bp medications. Pt orientation at baseline .  Hx dementia.

## 2023-06-07 NOTE — ED Notes (Signed)
This RN and NT to change pt depends. Depends wet with urine.

## 2023-06-07 NOTE — ED Notes (Signed)
Pt transported to CT ?

## 2023-06-07 NOTE — ED Notes (Signed)
[place] fall alarm placed for safety.

## 2023-06-07 NOTE — ED Notes (Signed)
Pt transported to MRI 

## 2023-06-08 NOTE — Progress Notes (Addendum)
Presented bed offers to pt's daughter, Kristine [name]. Kristine [name] wishes to speak with her sister and possibly tour facilities. She will inform this Clinical research associate as soon as she speaks with her sister.   Addend @ 2:15 PM Spoke with Kristine [name] to inquire about any update regarding facility choice. Kristine [name] reported they do not have a preference and that her sister is outreaching to facilities to tour today. This CSW encouraged Kristine [name] to inform our team as soon as possible so we can initiate Auth. Kristine [name] stated she will reach out to [place] as soon as they've decided on a facility.

## 2023-06-08 NOTE — Progress Notes (Signed)
Pt faxed out to [facility] SNFs at family's request. Bed offers pending.

## 2023-06-08 NOTE — ED Notes (Signed)
PT still dry at this time. Did not need changing

## 2023-06-08 NOTE — Progress Notes (Addendum)
CSW has reached out to the daughter at this time the daughter is still touring [place] [facility], once daughter leaves [place] Hatch daughter reports going to [facility]. CSW has asked the daughter to call back once she has completed both tours with final decision.    Addend@ 4:13 pm  CSW has sent a text to the patient's daughter. At this time the daughter states that she is on the way to [facility]. This CSW will follow back up with the patient at 5 pm.     Addend@ 5:31 pm   Patient's AUTH was approved. This CSW has reached out to Star at [facility] to make her aware awaiting a response. This CSW will update family once Star responds. TOC will continue to follow for DC.

## 2023-06-08 NOTE — Progress Notes (Signed)
TOC Dementia Note   Patient Details  Name: Kristine [name] Date of Birth: Sep 01, 1941 06/08/2023, 8:28 AM   To Whom It May Concern:  Please be advised that the above-named patient has a primary diagnosis of dementia which supersedes any psychiatric diagnosis.   Transition of Care (TOC) CM/SW Contact: Princella Ion, LCSW Phone Number: 06/08/2023, 8:28 AM

## 2023-06-08 NOTE — Progress Notes (Addendum)
30 Day PASRR Note   Patient Details  Name: Kristine [name] Date of Birth: 1940/12/19   Transition of Care [facility]) CM/SW Contact:    Princella Ion, LCSW Phone Number: 06/08/2023, 8:27 AM  To Whom It May Concern:  Please be advised that this patient will require a short-term nursing home stay - anticipated 30 days or less for rehabilitation and strengthening.   The plan is for return home.

## 2023-06-08 NOTE — ED Notes (Signed)
Report given to Keith

## 2023-06-08 NOTE — Evaluation (Signed)
Physical Therapy Evaluation Patient Details Name: Kristine [name] MRN: [id number] DOB: [birth year] Today's Date: 06/08/2023  History of Present Illness  Kristine [name] is a 82 y.o. female with history of lewy body dementia, HTN, CAD,  hypothyroidism, BTHA, presenting to the ED with concern for worsening weakness to ED 06/07/23.  Clinical Impression  Pt admitted with above diagnosis.  Pt currently with functional limitations due to the deficits listed below (see PT Problem List). Pt will benefit from acute skilled PT to increase their independence and safety with mobility to allow discharge.     The patient is awake and pleasant, spouse present to provide information related to PLOF. Per spouse, patient has been ambulatory with assistance of 1 person  and cane, although reports trunk quite flexed forward. Patient active with HHPT currently.  Patient presents with significant change in motor function, almost catatonic state,  extremities are rigid, posterior bias in sitting. Unable to attempt standing. Patient will benefit from further PT after discharge, <3hours/day.      Assistance Recommended at Discharge Frequent or constant Supervision/Assistance  If plan is discharge home, recommend the following:  Can travel by private vehicle  Two people to help with walking and/or transfers;Two people to help with bathing/dressing/bathroom;Assistance with cooking/housework;Assist for transportation        Equipment Recommendations None recommended by PT  Recommendations for Other Services       Functional Status Assessment Patient has had a recent decline in their functional status and demonstrates the ability to make significant improvements in function in a reasonable and predictable amount of time.     Precautions / Restrictions Precautions Precautions: Fall      Mobility  Bed Mobility Overal bed mobility: Needs Assistance Bed Mobility: Supine to Sit, Sit to Supine     Supine to  sit: Total assist Sit to supine: Total assist   General bed mobility comments: patient unable to assist with any mobility, trunk and extremities  sort of catatonic    Transfers                   General transfer comment: Not tested as patient demonstrated no sitting balance and stretcher height such that feet do not touch the floor.    Ambulation/Gait                  Stairs            Wheelchair Mobility     Tilt Bed    Modified Rankin (Stroke Patients Only)       Balance Overall balance assessment: Needs assistance, History of Falls Sitting-balance support: No upper extremity supported, Feet unsupported Sitting balance-Leahy Scale: Zero                                       Pertinent Vitals/Pain Pain Assessment Pain Assessment: No/denies pain    Home Living Family/patient expects to be discharged to:: Private residence Living Arrangements: Spouse/significant other;Children Available Help at Discharge: Family;Available 24 hours/day Type of Home: House Home Access: Stairs to enter   [employer] of Steps: 2   Home Layout: Two level Home Equipment: Agricultural consultant (2 wheels);Cane - single point;BSC/3in1      Prior Function Prior Level of Function : Needs assist  Cognitive Assist : Mobility (cognitive)           Mobility Comments: family assists  patient with ambul;ation, to  BR ADLs Comments: family assists with Bath abd post B/B     Hand Dominance   Dominant Hand: Right    Extremity/Trunk Assessment   Upper Extremity Assessment Upper Extremity Assessment:  (both UE's are rigid to PROM, unable to hold cup,)    Lower Extremity Assessment Lower Extremity Assessment:  (legs are rigid, holds stiff in extension when sitting on bed edge, able to hold legs flexed  in supine when placed in position)    Cervical / Trunk Assessment Cervical / Trunk Assessment: Normal  Communication   Communication: No  difficulties  Cognition Arousal/Alertness: Awake/alert Behavior During Therapy: WFL for tasks assessed/performed Overall Cognitive Status: History of cognitive impairments - at baseline                                 General Comments: oriented to self and spouse        General Comments      Exercises General Exercises - Lower Extremity Ankle Circles/Pumps: PROM, Both, Supine, 10 reps Hip ABduction/ADduction: AAROM, PROM, Both, 10 reps, Supine   Assessment/Plan    PT Assessment Patient needs continued PT services  PT Problem List Decreased strength;Decreased balance;Decreased cognition;Decreased knowledge of precautions;Decreased range of motion;Decreased mobility;Decreased activity tolerance       PT Treatment Interventions DME instruction;Functional mobility training;Balance training;Patient/family education;Gait training;Therapeutic activities;Therapeutic exercise;Cognitive remediation    PT Goals (Current goals can be found in the Care Plan section)  Acute Rehab PT Goals Patient Stated Goal: to get back to [facility] and daughter PT Goal Formulation: With patient/family Time For Goal Achievement: 06/22/23 Potential to Achieve Goals: Fair    Frequency Min 1X/week     Co-evaluation               AM-PAC PT "6 Clicks" Mobility  Outcome Measure Help needed turning from your back to your side while in a flat bed without using bedrails?: Total Help needed moving from lying on your back to sitting on the side of a flat bed without using bedrails?: Total Help needed moving to and from a bed to a chair (including a wheelchair)?: Total Help needed standing up from a chair using your arms (e.g., wheelchair or bedside chair)?: Total Help needed to walk in hospital room?: Total Help needed climbing 3-5 steps with a railing? : Total 6 Click Score: 6    End of Session   Activity Tolerance: Patient tolerated treatment well Patient left: in bed;with call  bell/phone within reach;with family/visitor present Nurse Communication: Mobility status;Need for lift equipment PT Visit Diagnosis: Unsteadiness on feet (R26.81);Muscle weakness (generalized) (M62.81);Difficulty in walking, not elsewhere classified (R26.2);History of falling (Z91.81)    Time: 0819-0900 PT Time Calculation (min) (ACUTE ONLY): 41 min   Charges:   PT Evaluation $PT Eval Low Complexity: 1 Low PT Treatments $Therapeutic Activity: 23-37 mins PT General Charges $$ ACUTE PT VISIT: 1 Visit         Blanchard Kelch PT Acute Rehabilitation Services Office [phone] Weekend pager-[phone]   Rada Hay 06/08/2023, 12:36 PM

## 2023-06-08 NOTE — NC FL2 (Signed)
[place] MEDICAID FL2 LEVEL OF CARE FORM     IDENTIFICATION  Patient Name: Kristine [name] Birthdate: 1941-08-11 Sex: female Admission Date (Current Location): 06/07/2023  [facility] and [place] Number:  Producer, television/film/video and Address:  [facility],  [address] [place], [place] [address]      Provider Number: [id number]  Attending Physician Name and Address:  Default, Provider, MD  Relative Name and Phone Number:  Sheral Flow (daughter) - [phone]    Current Level of Care: Hospital Recommended Level of Care: Skilled Nursing Facility Prior Approval Number:    Date Approved/Denied:   PASRR Number: [id number]  Discharge Plan: SNF    Current Diagnoses: Patient Active Problem List   Diagnosis Date Noted   Lewy body dementia without behavioral disturbance, psychotic disturbance, mood disturbance, or anxiety (HCC) 06/02/2022   Acute memory impairment 05/16/2022   Impaired functional mobility, balance, and endurance 05/16/2022   Chronic left-sided low back pain with left-sided sciatica 02/05/2022   Lumbar radiculopathy 02/05/2022   Chest pain 05/14/2021   Somniloquy 04/08/2021   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   Snoring 12/19/2020   Excessive daytime sleepiness 12/19/2020   Gait disturbance 12/19/2020   Pain due to onychomycosis of toenails of both feet 05/25/2019   Porokeratosis 05/25/2019   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   Angioedema 05/31/2015   Fever 03/11/2015   Acute blood loss anemia 03/11/2015   Melena 03/10/2015   GI bleed 03/09/2015   Peripheral arterial disease (HCC) 03/01/2012   CAD (coronary artery disease) 01/20/2012   HTN (hypertension)    Hyperlipidemia    PVD (peripheral vascular disease) (HCC)    COPD (chronic obstructive pulmonary disease) (HCC)    Hyperthyroidism    OA (osteoarthritis of spine)    STEMI (ST elevation myocardial infarction) (HCC) 04/07/2010     Orientation RESPIRATION BLADDER Height & Weight     Self  Normal Incontinent Weight:   Height:     BEHAVIORAL SYMPTOMS/MOOD NEUROLOGICAL BOWEL NUTRITION STATUS      Incontinent Diet (Regular)  AMBULATORY STATUS COMMUNICATION OF NEEDS Skin   Extensive Assist Verbally Normal                       Personal Care Assistance Level of Assistance  Bathing, Feeding, Dressing Bathing Assistance: Maximum assistance Feeding assistance: Limited assistance Dressing Assistance: Maximum assistance     Functional Limitations Info  Sight, Hearing, Speech Sight Info: Adequate Hearing Info: Adequate Speech Info: Adequate    SPECIAL CARE FACTORS FREQUENCY  PT (By licensed PT), OT (By licensed OT)     PT Frequency: x5/week OT Frequency: x5/week            Contractures Contractures Info: Not present    Additional Factors Info  Code Status, Allergies, Psychotropic Code Status Info: Full Allergies Info: Ace Inhibitors  Iodinated Contrast Media  Other  Shellfish-derived Products  Hydrocodone           Current Medications (06/08/2023):  This is the current hospital active medication list Current Facility-Administered Medications  Medication Dose Route Frequency Provider Last Rate Last Admin   aspirin EC tablet 81 mg  81 mg Oral Daily Jacalyn Lefevre, MD   81 mg at 06/08/23 0929   atorvastatin (LIPITOR) tablet 80 mg  80 mg Oral Daily Jacalyn Lefevre, MD       clonazePAM Scarlette Calico) tablet 1.5 mg  1.5 mg Oral QHS Jacalyn Lefevre, MD  1.5 mg at 06/07/23 2108   donepezil (ARICEPT) tablet 10 mg  10 mg Oral QHS Jacalyn Lefevre, MD   10 mg at 06/07/23 2109   hydrALAZINE (APRESOLINE) tablet 12.5 mg  12.5 mg Oral Daily Jacalyn Lefevre, MD   12.5 mg at 06/08/23 1610   hydrOXYzine (ATARAX) tablet 10 mg  10 mg Oral BID PRN Jacalyn Lefevre, MD       levothyroxine (SYNTHROID) tablet 50 mcg  50 mcg Oral Robyne Peers, MD   50 mcg at 06/08/23 0612   metoprolol succinate (TOPROL-XL) 24  hr tablet 25 mg  25 mg Oral Daily Jacalyn Lefevre, MD   25 mg at 06/08/23 0930   mirtazapine (REMERON) tablet 7.5 mg  7.5 mg Oral QHS Jacalyn Lefevre, MD   7.5 mg at 06/07/23 2109   QUEtiapine (SEROQUEL) tablet 25 mg  25 mg Oral QHS Jacalyn Lefevre, MD   25 mg at 06/07/23 2109   risperiDONE (RISPERDAL) tablet 0.25 mg  0.25 mg Oral BID PRN Jacalyn Lefevre, MD       Current Outpatient Medications  Medication Sig Dispense Refill   aspirin EC 81 MG tablet Take 81 mg by mouth daily.     atorvastatin (LIPITOR) 80 MG tablet Take 1 tablet (80 mg total) by mouth daily. 90 tablet 3   BREZTRI AEROSPHERE 160-9-4.8 MCG/ACT AERO Inhale 2 puffs into the lungs 2 (two) times daily.     clonazePAM (KLONOPIN) 1 MG tablet 1.5 pills po qHS (Patient taking differently: Take 1.5 mg by mouth at bedtime. 1.5 pills po qHS) 45 tablet 5   donepezil (ARICEPT) 10 MG tablet TAKE 1 TABLET(5 MG) BY MOUTH AT BEDTIME (Patient taking differently: Take 10 mg by mouth See admin instructions. TAKE 1 TABLET(5 MG) BY MOUTH AT BEDTIME) 90 tablet 1   ezetimibe (ZETIA) 10 MG tablet Take 1 tablet (10 mg total) by mouth daily. 90 tablet 3   hydrALAZINE (APRESOLINE) 25 MG tablet Take 1/2 a tablet by mouth once a day (Patient taking differently: Take 12.5 mg by mouth daily.) 45 tablet 3   hydrochlorothiazide (HYDRODIURIL) 25 MG tablet TAKE 1 TABLET(25 MG) BY MOUTH DAILY (Patient taking differently: Take 25 mg by mouth daily.) 90 tablet 1   hydrOXYzine (ATARAX) 10 MG tablet Take 1 or 2 tablets by mouth twice a day as needed for agitation 90 tablet 3   levothyroxine (SYNTHROID) 50 MCG tablet Take 50 mcg by mouth every other day.     metoprolol succinate (TOPROL-XL) 50 MG 24 hr tablet TAKE 1 TABLET(50 MG) BY MOUTH DAILY (Patient taking differently: Take 25 mg by mouth See admin instructions.) 90 tablet 3   mirtazapine (REMERON) 15 MG tablet TAKE 1/2 TO 1 TABLET BY MOUTH EVERY NIGHT AT BEDTIME (Patient taking differently: Take 7.5 mg by mouth at  bedtime.) 30 tablet 5   nitroGLYCERIN (NITROSTAT) 0.3 MG SL tablet Place 1 tablet (0.3 mg total) under the tongue every 5 (five) minutes as needed for chest pain. Max 3 doses 5 minutes apart per episode of chest pain. Call 911 if need 3rd dose. 25 tablet 3   QUEtiapine (SEROQUEL) 25 MG tablet Take 1 tablet (25 mg total) by mouth at bedtime. 30 tablet 5   risperiDONE (RISPERDAL) 0.25 MG tablet Take 1 tablet (0.25 mg total) by mouth 2 (two) times daily as needed. Secondary to Lewy Body Dementia 60 tablet 5     Discharge Medications: Please see discharge summary for a list of discharge medications.  Relevant Imaging  Results:  Relevant Lab Results:   Additional Information SSN: [id number]  Princella Ion, LCSW

## 2023-06-08 NOTE — ED Provider Notes (Signed)
Emergency Medicine Observation Re-evaluation Note  Kristine [name] is a 82 y.o. female, seen on rounds today.  Pt initially presented to the ED for complaints of Weakness and Failure To Thrive Currently, the patient is awake resting in bed with family at bedside.  Physical Exam  BP (!) 154/85   Pulse (!) 54   Temp 98.4 F (36.9 C) (Oral)   Resp 11   SpO2 96%  Physical Exam General: Awake and alert, no acute distress Cardiac: Regular rate Lungs: No increased work of breathing Psych: Calm, cooperative  ED Course / MDM  EKG:EKG Interpretation Date/Time:  Sunday June 07 2023 14:12:25 EDT Ventricular Rate:  74 PR Interval:  188 QRS Duration:  147 QT Interval:  389 QTC Calculation: 435 R Axis:   69  Text Interpretation: Sinus rhythm Biatrial enlargement Artifact in lead(s) I II aVR aVL aVF Confirmed by Alvester Chou [phone]) on 06/07/2023 2:20:25 PM  I have reviewed the labs performed to date as well as medications administered while in observation.  Recent changes in the last 24 hours include medically cleared, pending TOC/PT eval for possible SNF placement.  Plan  Current plan is for TOC/PT eval.    Rexford Maus, DO 06/08/23 [phone]

## 2023-06-08 NOTE — Progress Notes (Addendum)
Transition of Care [facility]) - Emergency Department Mini Assessment   Patient Details  Name: Kristine [name] MRN: [id number] Date of Birth: August 16, 1941  Transition of Care [facility]) CM/SW Contact:    Princella Ion, LCSW Phone Number: 06/08/2023, 7:48 AM   Clinical Narrative: Pt presented to ED with weakness and failure to thrive. Per pt's daughter, Sheral Flow, pt typically ambulates with a cane and has been unable to since 6/24. This CSW informed Edwina of SNF process. Edwina inquired about whether pt's insurance [facility]) is different from [place] expansion. This CSW explained that Medicaid Expansion was recently passed to allow folks who were not eligible for Medicaid to receive coverage under a managed Medicaid plan. This CSW explained that Medicare and Medicaid are different and if eventually they are interested in LTC for their mother, they should start at DSS to apply for Medicaid. Edwina verbalized understanding.  Sheral Flow reported her sister's preferred facility is [facility] but stated they would like the pt to remain in [place] and close enough to her husband that he can visit her daily. This CSW provided Edwina with [email or web address] TOC following for PT eval.    ED Mini Assessment: What brought you to the Emergency Department? : weakness, failure to thrive  Barriers to Discharge: SNF Pending bed offer     Means of departure: Not know       Patient Contact and Communications Key Contact 1: Sheral Flow (daughter) -[phone]   Spoke with: Georgina Quint Date: 06/08/23,   Contact time: 8295 Contact Phone Number: [phone] Call outcome: Family wishes for pt to go to SNF.    CMS Medicare.gov Compare Post Acute Care list provided to:: Patient Represenative (must comment) (Adult children) Choice offered to / list presented to : Adult Children  Admission diagnosis:  weakness, failure to thrive Patient Active Problem List   Diagnosis Date Noted   Lewy body dementia without  behavioral disturbance, psychotic disturbance, mood disturbance, or anxiety (HCC) 06/02/2022   Acute memory impairment 05/16/2022   Impaired functional mobility, balance, and endurance 05/16/2022   Chronic left-sided low back pain with left-sided sciatica 02/05/2022   Lumbar radiculopathy 02/05/2022   Chest pain 05/14/2021   Somniloquy 04/08/2021   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   Snoring 12/19/2020   Excessive daytime sleepiness 12/19/2020   Gait disturbance 12/19/2020   Pain due to onychomycosis of toenails of both feet 05/25/2019   Porokeratosis 05/25/2019   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   Angioedema 05/31/2015   Fever 03/11/2015   Acute blood loss anemia 03/11/2015   Melena 03/10/2015   GI bleed 03/09/2015   Peripheral arterial disease (HCC) 03/01/2012   CAD (coronary artery disease) 01/20/2012   HTN (hypertension)    Hyperlipidemia    PVD (peripheral vascular disease) (HCC)    COPD (chronic obstructive pulmonary disease) (HCC)    Hyperthyroidism    OA (osteoarthritis of spine)    STEMI (ST elevation myocardial infarction) (HCC) 04/07/2010   PCP:  Renaye Rakers, MD Pharmacy:   [facility] [address] [place] - [address] [place] [address] Phone: [phone] Fax: [phone]  [id number] [place], [place] - [address] AT [facility] [address] [place] [address] Phone: [phone] Fax: [phone]  [facility] - [place], [place] - [address] [place] [address] Phone: [phone] Fax: [phone]  [facility] [phone] -  [place], [place] - [address] AT [facility] OF [address] [place] [address] Phone: [phone] Fax: [phone]

## 2023-06-08 NOTE — ED Notes (Signed)
RN rounding , pt in room in stretcher, rails x2, call light in reach pt with eyes closed, chest rise and fall noted , husband at bedside in recliner with eyes closed, light dimmed for rested.

## 2023-06-08 NOTE — Progress Notes (Signed)
30 Day PASRR Note   Patient Details  Name: Kristine [name] Date of Birth: Mar 02, 1941   Transition of Care [facility]) CM/SW Contact:    Princella Ion, LCSW Phone Number: 06/08/2023, 8:29 AM  To Whom It May Concern:  Please be advised that this patient will require a short-term nursing home stay - anticipated 30 days or less for rehabilitation and strengthening.   The plan is for return home.

## 2023-06-09 ENCOUNTER — Encounter (HOSPITAL_COMMUNITY): Payer: Self-pay

## 2023-06-09 ENCOUNTER — Ambulatory Visit: Payer: Medicare PPO | Admitting: Podiatry

## 2023-06-09 ENCOUNTER — Other Ambulatory Visit: Payer: Self-pay

## 2023-06-09 DIAGNOSIS — F5101 Primary insomnia: Secondary | ICD-10-CM | POA: Diagnosis not present

## 2023-06-09 DIAGNOSIS — G3183 Dementia with Lewy bodies: Secondary | ICD-10-CM | POA: Diagnosis not present

## 2023-06-09 DIAGNOSIS — I739 Peripheral vascular disease, unspecified: Secondary | ICD-10-CM | POA: Diagnosis not present

## 2023-06-09 DIAGNOSIS — F0282 Dementia in other diseases classified elsewhere, unspecified severity, with psychotic disturbance: Secondary | ICD-10-CM | POA: Diagnosis not present

## 2023-06-09 DIAGNOSIS — Z9181 History of falling: Secondary | ICD-10-CM | POA: Diagnosis not present

## 2023-06-09 DIAGNOSIS — M6259 Muscle wasting and atrophy, not elsewhere classified, multiple sites: Secondary | ICD-10-CM | POA: Diagnosis not present

## 2023-06-09 DIAGNOSIS — J449 Chronic obstructive pulmonary disease, unspecified: Secondary | ICD-10-CM | POA: Diagnosis not present

## 2023-06-09 DIAGNOSIS — Z7401 Bed confinement status: Secondary | ICD-10-CM | POA: Diagnosis not present

## 2023-06-09 DIAGNOSIS — R2681 Unsteadiness on feet: Secondary | ICD-10-CM | POA: Diagnosis not present

## 2023-06-09 DIAGNOSIS — I2510 Atherosclerotic heart disease of native coronary artery without angina pectoris: Secondary | ICD-10-CM | POA: Diagnosis not present

## 2023-06-09 DIAGNOSIS — R600 Localized edema: Secondary | ICD-10-CM | POA: Diagnosis not present

## 2023-06-09 DIAGNOSIS — R1311 Dysphagia, oral phase: Secondary | ICD-10-CM | POA: Diagnosis not present

## 2023-06-09 DIAGNOSIS — F0280 Dementia in other diseases classified elsewhere without behavioral disturbance: Secondary | ICD-10-CM | POA: Diagnosis not present

## 2023-06-09 DIAGNOSIS — R41841 Cognitive communication deficit: Secondary | ICD-10-CM | POA: Diagnosis not present

## 2023-06-09 DIAGNOSIS — R627 Adult failure to thrive: Secondary | ICD-10-CM | POA: Diagnosis not present

## 2023-06-09 DIAGNOSIS — M6281 Muscle weakness (generalized): Secondary | ICD-10-CM | POA: Diagnosis not present

## 2023-06-09 DIAGNOSIS — R1312 Dysphagia, oropharyngeal phase: Secondary | ICD-10-CM | POA: Diagnosis not present

## 2023-06-09 DIAGNOSIS — E039 Hypothyroidism, unspecified: Secondary | ICD-10-CM | POA: Diagnosis not present

## 2023-06-09 DIAGNOSIS — R5381 Other malaise: Secondary | ICD-10-CM | POA: Diagnosis not present

## 2023-06-09 DIAGNOSIS — R531 Weakness: Secondary | ICD-10-CM | POA: Diagnosis not present

## 2023-06-09 DIAGNOSIS — E876 Hypokalemia: Secondary | ICD-10-CM | POA: Diagnosis not present

## 2023-06-09 DIAGNOSIS — Z7189 Other specified counseling: Secondary | ICD-10-CM | POA: Diagnosis not present

## 2023-06-09 DIAGNOSIS — L84 Corns and callosities: Secondary | ICD-10-CM | POA: Diagnosis not present

## 2023-06-09 NOTE — Progress Notes (Addendum)
TOC to follow up with [facility] to inquire about admit time to SNF.   Addend @ 10:16 AM Notified EDP and RN that pt will d/c to [facility] today. This CSW will provide room and report once received. PTAR to transport. Per chart review, husband at bedside.

## 2023-06-09 NOTE — ED Notes (Signed)
PTAR called for transport to [facility]

## 2023-06-09 NOTE — ED Provider Notes (Signed)
Emergency Medicine Observation Re-evaluation Note  Kristine [name] is a 82 y.o. female, seen on rounds today.  Pt initially presented to the ED for complaints of Weakness and Failure To Thrive Currently, the patient is awaiting placement at SNF.  [name] is feeding her.  She has no complaints.  Physical Exam  BP 136/69 (BP Location: Right Arm)   Pulse 64   Temp 98.2 F (36.8 C) (Oral)   Resp 15   SpO2 100%  Physical Exam General: Alert, eating. Cardiac: Regular Lungs: No respiratory distress.  Clear Psych: Affect flat but answers simple questions musculoskeletal: Significant lower extremity atrophy.  No significant edema or calf tenderness.  ED Course / MDM  EKG:EKG Interpretation Date/Time:  Sunday June 07 2023 14:12:25 EDT Ventricular Rate:  74 PR Interval:  188 QRS Duration:  147 QT Interval:  389 QTC Calculation: 435 R Axis:   69  Text Interpretation: Sinus rhythm Biatrial enlargement Artifact in lead(s) I II aVR aVL aVF Confirmed by Alvester Chou [phone]) on 06/07/2023 2:20:25 PM  I have reviewed the labs performed to date as well as medications administered while in observation.  Recent changes in the last 24 hours include none.  Plan  Current plan is for placement.  Possibly today.Marland Kitchen  His husband is very concerned for having patient checked frequently.  He feels that nobody has been in to get vital signs or checking in on them.  He is dismayed that the nurses are not checking in and taking vital signs or having her on the monitor like they did on the acute care side.  He was advised that patient has been medically cleared and at this point she is getting nursing care for assistance in daily needs such as medication administration.  Vital signs once per shift and mobility assistance.  Discussed with the patient's nurse.  She advises that they have been multiple times and patient and husband are directly in slight line of the nurse at the desk.  Patient's vital signs  have been stable last checked documented at 00: 24 normotensive with regular heart rate at 64, 100% oxygen saturation.  [name] is very pleasant and caring.  He has many notes written down about her care and time of all interactions, however, I suspect possible cognitive impairment and recall affecting him as well.  I tried reassurance and let him know that social work notes indicate probable placement later today.    Arby Barrette, MD 06/09/23 [phone]

## 2023-06-10 DIAGNOSIS — I739 Peripheral vascular disease, unspecified: Secondary | ICD-10-CM | POA: Diagnosis not present

## 2023-06-10 DIAGNOSIS — E876 Hypokalemia: Secondary | ICD-10-CM | POA: Diagnosis not present

## 2023-06-10 DIAGNOSIS — M6259 Muscle wasting and atrophy, not elsewhere classified, multiple sites: Secondary | ICD-10-CM | POA: Diagnosis not present

## 2023-06-10 DIAGNOSIS — J449 Chronic obstructive pulmonary disease, unspecified: Secondary | ICD-10-CM | POA: Diagnosis not present

## 2023-06-10 DIAGNOSIS — I2510 Atherosclerotic heart disease of native coronary artery without angina pectoris: Secondary | ICD-10-CM | POA: Diagnosis not present

## 2023-06-10 DIAGNOSIS — R5381 Other malaise: Secondary | ICD-10-CM | POA: Diagnosis not present

## 2023-06-10 DIAGNOSIS — F0280 Dementia in other diseases classified elsewhere without behavioral disturbance: Secondary | ICD-10-CM | POA: Diagnosis not present

## 2023-06-10 DIAGNOSIS — G3183 Dementia with Lewy bodies: Secondary | ICD-10-CM | POA: Diagnosis not present

## 2023-06-10 DIAGNOSIS — E039 Hypothyroidism, unspecified: Secondary | ICD-10-CM | POA: Diagnosis not present

## 2023-06-11 DIAGNOSIS — Z9181 History of falling: Secondary | ICD-10-CM | POA: Diagnosis not present

## 2023-06-11 DIAGNOSIS — M6281 Muscle weakness (generalized): Secondary | ICD-10-CM | POA: Diagnosis not present

## 2023-06-11 DIAGNOSIS — R5381 Other malaise: Secondary | ICD-10-CM | POA: Diagnosis not present

## 2023-06-11 DIAGNOSIS — R2681 Unsteadiness on feet: Secondary | ICD-10-CM | POA: Diagnosis not present

## 2023-06-11 DIAGNOSIS — G3183 Dementia with Lewy bodies: Secondary | ICD-10-CM | POA: Diagnosis not present

## 2023-06-11 DIAGNOSIS — R627 Adult failure to thrive: Secondary | ICD-10-CM | POA: Diagnosis not present

## 2023-06-17 DIAGNOSIS — R600 Localized edema: Secondary | ICD-10-CM | POA: Diagnosis not present

## 2023-06-17 DIAGNOSIS — R5381 Other malaise: Secondary | ICD-10-CM | POA: Diagnosis not present

## 2023-06-17 DIAGNOSIS — L84 Corns and callosities: Secondary | ICD-10-CM | POA: Diagnosis not present

## 2023-06-17 DIAGNOSIS — G3183 Dementia with Lewy bodies: Secondary | ICD-10-CM | POA: Diagnosis not present

## 2023-06-17 DIAGNOSIS — Z7189 Other specified counseling: Secondary | ICD-10-CM | POA: Diagnosis not present

## 2023-06-17 DIAGNOSIS — J449 Chronic obstructive pulmonary disease, unspecified: Secondary | ICD-10-CM | POA: Diagnosis not present

## 2023-06-17 DIAGNOSIS — F0280 Dementia in other diseases classified elsewhere without behavioral disturbance: Secondary | ICD-10-CM | POA: Diagnosis not present

## 2023-06-17 DIAGNOSIS — R1312 Dysphagia, oropharyngeal phase: Secondary | ICD-10-CM | POA: Diagnosis not present

## 2023-06-19 ENCOUNTER — Ambulatory Visit: Payer: Medicare PPO | Admitting: Physical Medicine and Rehabilitation

## 2023-06-22 DIAGNOSIS — R5381 Other malaise: Secondary | ICD-10-CM | POA: Diagnosis not present

## 2023-06-22 DIAGNOSIS — Z9181 History of falling: Secondary | ICD-10-CM | POA: Diagnosis not present

## 2023-06-22 DIAGNOSIS — R2681 Unsteadiness on feet: Secondary | ICD-10-CM | POA: Diagnosis not present

## 2023-06-22 DIAGNOSIS — M6281 Muscle weakness (generalized): Secondary | ICD-10-CM | POA: Diagnosis not present

## 2023-06-22 DIAGNOSIS — G3183 Dementia with Lewy bodies: Secondary | ICD-10-CM | POA: Diagnosis not present

## 2023-06-22 DIAGNOSIS — R627 Adult failure to thrive: Secondary | ICD-10-CM | POA: Diagnosis not present

## 2023-06-24 DIAGNOSIS — Z9181 History of falling: Secondary | ICD-10-CM | POA: Diagnosis not present

## 2023-06-24 DIAGNOSIS — R627 Adult failure to thrive: Secondary | ICD-10-CM | POA: Diagnosis not present

## 2023-06-24 DIAGNOSIS — R2681 Unsteadiness on feet: Secondary | ICD-10-CM | POA: Diagnosis not present

## 2023-06-24 DIAGNOSIS — R5381 Other malaise: Secondary | ICD-10-CM | POA: Diagnosis not present

## 2023-06-24 DIAGNOSIS — G3183 Dementia with Lewy bodies: Secondary | ICD-10-CM | POA: Diagnosis not present

## 2023-06-24 DIAGNOSIS — M6281 Muscle weakness (generalized): Secondary | ICD-10-CM | POA: Diagnosis not present

## 2023-06-29 DIAGNOSIS — R2681 Unsteadiness on feet: Secondary | ICD-10-CM | POA: Diagnosis not present

## 2023-06-29 DIAGNOSIS — R627 Adult failure to thrive: Secondary | ICD-10-CM | POA: Diagnosis not present

## 2023-06-29 DIAGNOSIS — R5381 Other malaise: Secondary | ICD-10-CM | POA: Diagnosis not present

## 2023-06-29 DIAGNOSIS — M6281 Muscle weakness (generalized): Secondary | ICD-10-CM | POA: Diagnosis not present

## 2023-06-29 DIAGNOSIS — G3183 Dementia with Lewy bodies: Secondary | ICD-10-CM | POA: Diagnosis not present

## 2023-06-29 DIAGNOSIS — Z9181 History of falling: Secondary | ICD-10-CM | POA: Diagnosis not present

## 2023-07-02 DIAGNOSIS — R2681 Unsteadiness on feet: Secondary | ICD-10-CM | POA: Diagnosis not present

## 2023-07-02 DIAGNOSIS — R627 Adult failure to thrive: Secondary | ICD-10-CM | POA: Diagnosis not present

## 2023-07-02 DIAGNOSIS — G3183 Dementia with Lewy bodies: Secondary | ICD-10-CM | POA: Diagnosis not present

## 2023-07-02 DIAGNOSIS — R5381 Other malaise: Secondary | ICD-10-CM | POA: Diagnosis not present

## 2023-07-02 DIAGNOSIS — Z9181 History of falling: Secondary | ICD-10-CM | POA: Diagnosis not present

## 2023-07-02 DIAGNOSIS — M6281 Muscle weakness (generalized): Secondary | ICD-10-CM | POA: Diagnosis not present

## 2023-07-08 ENCOUNTER — Ambulatory Visit: Payer: Medicare PPO | Admitting: Podiatry

## 2023-07-08 DIAGNOSIS — G3183 Dementia with Lewy bodies: Secondary | ICD-10-CM | POA: Diagnosis not present

## 2023-07-08 DIAGNOSIS — R2681 Unsteadiness on feet: Secondary | ICD-10-CM | POA: Diagnosis not present

## 2023-07-08 DIAGNOSIS — M6281 Muscle weakness (generalized): Secondary | ICD-10-CM | POA: Diagnosis not present

## 2023-07-09 DIAGNOSIS — G3183 Dementia with Lewy bodies: Secondary | ICD-10-CM | POA: Diagnosis not present

## 2023-07-09 DIAGNOSIS — M6281 Muscle weakness (generalized): Secondary | ICD-10-CM | POA: Diagnosis not present

## 2023-07-09 DIAGNOSIS — R2681 Unsteadiness on feet: Secondary | ICD-10-CM | POA: Diagnosis not present

## 2023-07-10 DIAGNOSIS — M6281 Muscle weakness (generalized): Secondary | ICD-10-CM | POA: Diagnosis not present

## 2023-07-10 DIAGNOSIS — R2681 Unsteadiness on feet: Secondary | ICD-10-CM | POA: Diagnosis not present

## 2023-07-10 DIAGNOSIS — G3183 Dementia with Lewy bodies: Secondary | ICD-10-CM | POA: Diagnosis not present

## 2023-07-12 DIAGNOSIS — R2681 Unsteadiness on feet: Secondary | ICD-10-CM | POA: Diagnosis not present

## 2023-07-12 DIAGNOSIS — M6281 Muscle weakness (generalized): Secondary | ICD-10-CM | POA: Diagnosis not present

## 2023-07-12 DIAGNOSIS — G3183 Dementia with Lewy bodies: Secondary | ICD-10-CM | POA: Diagnosis not present

## 2023-07-13 DIAGNOSIS — E039 Hypothyroidism, unspecified: Secondary | ICD-10-CM | POA: Diagnosis not present

## 2023-07-13 DIAGNOSIS — J449 Chronic obstructive pulmonary disease, unspecified: Secondary | ICD-10-CM | POA: Diagnosis not present

## 2023-07-13 DIAGNOSIS — F0280 Dementia in other diseases classified elsewhere without behavioral disturbance: Secondary | ICD-10-CM | POA: Diagnosis not present

## 2023-07-13 DIAGNOSIS — Z7982 Long term (current) use of aspirin: Secondary | ICD-10-CM | POA: Diagnosis not present

## 2023-07-13 DIAGNOSIS — G3183 Dementia with Lewy bodies: Secondary | ICD-10-CM | POA: Diagnosis not present

## 2023-07-13 DIAGNOSIS — M6281 Muscle weakness (generalized): Secondary | ICD-10-CM | POA: Diagnosis not present

## 2023-07-13 DIAGNOSIS — R2681 Unsteadiness on feet: Secondary | ICD-10-CM | POA: Diagnosis not present

## 2023-07-13 DIAGNOSIS — I739 Peripheral vascular disease, unspecified: Secondary | ICD-10-CM | POA: Diagnosis not present

## 2023-07-14 DIAGNOSIS — E039 Hypothyroidism, unspecified: Secondary | ICD-10-CM | POA: Diagnosis not present

## 2023-07-14 DIAGNOSIS — F0280 Dementia in other diseases classified elsewhere without behavioral disturbance: Secondary | ICD-10-CM | POA: Diagnosis not present

## 2023-07-14 DIAGNOSIS — J449 Chronic obstructive pulmonary disease, unspecified: Secondary | ICD-10-CM | POA: Diagnosis not present

## 2023-07-14 DIAGNOSIS — G3183 Dementia with Lewy bodies: Secondary | ICD-10-CM | POA: Diagnosis not present

## 2023-07-14 DIAGNOSIS — M6259 Muscle wasting and atrophy, not elsewhere classified, multiple sites: Secondary | ICD-10-CM | POA: Diagnosis not present

## 2023-07-19 DIAGNOSIS — G3183 Dementia with Lewy bodies: Secondary | ICD-10-CM | POA: Diagnosis not present

## 2023-07-19 DIAGNOSIS — J449 Chronic obstructive pulmonary disease, unspecified: Secondary | ICD-10-CM | POA: Diagnosis not present

## 2023-07-19 DIAGNOSIS — E039 Hypothyroidism, unspecified: Secondary | ICD-10-CM | POA: Diagnosis not present

## 2023-07-19 DIAGNOSIS — F0280 Dementia in other diseases classified elsewhere without behavioral disturbance: Secondary | ICD-10-CM | POA: Diagnosis not present

## 2023-07-19 DIAGNOSIS — I252 Old myocardial infarction: Secondary | ICD-10-CM | POA: Diagnosis not present

## 2023-07-19 DIAGNOSIS — I2510 Atherosclerotic heart disease of native coronary artery without angina pectoris: Secondary | ICD-10-CM | POA: Diagnosis not present

## 2023-07-19 DIAGNOSIS — I10 Essential (primary) hypertension: Secondary | ICD-10-CM | POA: Diagnosis not present

## 2023-07-19 DIAGNOSIS — E440 Moderate protein-calorie malnutrition: Secondary | ICD-10-CM | POA: Diagnosis not present

## 2023-07-19 DIAGNOSIS — I739 Peripheral vascular disease, unspecified: Secondary | ICD-10-CM | POA: Diagnosis not present

## 2023-07-22 DIAGNOSIS — I252 Old myocardial infarction: Secondary | ICD-10-CM | POA: Diagnosis not present

## 2023-07-22 DIAGNOSIS — I10 Essential (primary) hypertension: Secondary | ICD-10-CM | POA: Diagnosis not present

## 2023-07-22 DIAGNOSIS — J449 Chronic obstructive pulmonary disease, unspecified: Secondary | ICD-10-CM | POA: Diagnosis not present

## 2023-07-22 DIAGNOSIS — F0280 Dementia in other diseases classified elsewhere without behavioral disturbance: Secondary | ICD-10-CM | POA: Diagnosis not present

## 2023-07-22 DIAGNOSIS — G3183 Dementia with Lewy bodies: Secondary | ICD-10-CM | POA: Diagnosis not present

## 2023-07-22 DIAGNOSIS — E440 Moderate protein-calorie malnutrition: Secondary | ICD-10-CM | POA: Diagnosis not present

## 2023-07-22 DIAGNOSIS — I739 Peripheral vascular disease, unspecified: Secondary | ICD-10-CM | POA: Diagnosis not present

## 2023-07-22 DIAGNOSIS — I2510 Atherosclerotic heart disease of native coronary artery without angina pectoris: Secondary | ICD-10-CM | POA: Diagnosis not present

## 2023-07-22 DIAGNOSIS — E039 Hypothyroidism, unspecified: Secondary | ICD-10-CM | POA: Diagnosis not present

## 2023-07-24 ENCOUNTER — Telehealth: Payer: Self-pay | Admitting: Neurology

## 2023-07-24 NOTE — Telephone Encounter (Signed)
Pt's daughter Sheral Flow called needing to speak to the RN or MD regarding some concerns she is having about her mother. Sheral Flow states the pt has been sweating excessively and is needing to discuss. Please advise.

## 2023-07-25 DIAGNOSIS — I739 Peripheral vascular disease, unspecified: Secondary | ICD-10-CM | POA: Diagnosis not present

## 2023-07-25 DIAGNOSIS — E440 Moderate protein-calorie malnutrition: Secondary | ICD-10-CM | POA: Diagnosis not present

## 2023-07-25 DIAGNOSIS — I2510 Atherosclerotic heart disease of native coronary artery without angina pectoris: Secondary | ICD-10-CM | POA: Diagnosis not present

## 2023-07-25 DIAGNOSIS — G3183 Dementia with Lewy bodies: Secondary | ICD-10-CM | POA: Diagnosis not present

## 2023-07-25 DIAGNOSIS — E039 Hypothyroidism, unspecified: Secondary | ICD-10-CM | POA: Diagnosis not present

## 2023-07-25 DIAGNOSIS — I10 Essential (primary) hypertension: Secondary | ICD-10-CM | POA: Diagnosis not present

## 2023-07-25 DIAGNOSIS — F0280 Dementia in other diseases classified elsewhere without behavioral disturbance: Secondary | ICD-10-CM | POA: Diagnosis not present

## 2023-07-25 DIAGNOSIS — I252 Old myocardial infarction: Secondary | ICD-10-CM | POA: Diagnosis not present

## 2023-07-25 DIAGNOSIS — J449 Chronic obstructive pulmonary disease, unspecified: Secondary | ICD-10-CM | POA: Diagnosis not present

## 2023-07-27 NOTE — Telephone Encounter (Signed)
I had an extended phone call with the patient's daughter, Sheral Flow (23+ minutes). She discussed concerns about her mother. She said on June 30th she and her sister were visiting her mother. She saw the patient was "off", lying down, looking confused. Sheral Flow is an Charity fundraiser, she had some concerns that her mother may have had a light stroke. She had elevated blood pressure. Nothing was done at that time. The following day she noted the patient had a high heart rate. The patient's blood pressure was higher on the right side than the left. She could not communicate what was wrong. Her daughter, Sheral Flow called EMS. She stayed in the hospital for several days.   Upon discharge the patient went to a facility, [facility], where she received PT/OT. The rooms had adjustable temperatures and her mother slept well during that time.  When she was discharged and returned home the patient began to have sleep difficulties again. The patient's husband noticed that if the home temperature was set to 78 degrees, she would like to be dressed in layers. The patient's husband then stopped using the [facility] to make it easier on himself (he no longer had to assist her in putting on sweaters). He will also leave the TV and lights on when the patient is trying to sleep.   Her daughter Sheral Flow is afraid the heat, lights, and television are contributing to the patient's sleep difficulties. The patient's husband will not change his pattern of behavior.  The patient can no longer lie down to nap. She is sitting up, which her daughter is afraid of due to her being a falls risk. Her mother began having episodes of "fighting" in her sleep recently.  The daughter is not sure if medications may have been altered at [facility] or if environmental factors there assisted the patient in obtaining good sleep hygiene. She is looking for recommendations to help her mother sleep well again.  I recommended a Child psychotherapist or case manager get involved to  assist with the patient's current living situation. She was unsure where she could obtain this type of assistance.  She is concerned about the patient's lack of ability to achieve REM sleep. She would like to know if medication adjustments are advisable.

## 2023-07-27 NOTE — Telephone Encounter (Signed)
I left a voicemail for the patient's daughter, Sheral Flow (as per DPR). I relayed Dr. Bonnita Hollow note that he does not want to make any medication changes at the moment. He advises using a fan at night as a compromise. I advised her to call back with any questions or concerns.

## 2023-07-28 ENCOUNTER — Emergency Department (HOSPITAL_COMMUNITY): Payer: Medicare PPO

## 2023-07-28 ENCOUNTER — Encounter (HOSPITAL_COMMUNITY): Payer: Self-pay | Admitting: Emergency Medicine

## 2023-07-28 ENCOUNTER — Other Ambulatory Visit: Payer: Self-pay

## 2023-07-28 ENCOUNTER — Emergency Department (HOSPITAL_COMMUNITY)
Admission: EM | Admit: 2023-07-28 | Discharge: 2023-07-28 | Disposition: A | Discharge status: Home / Self Care | Payer: Medicare PPO | Attending: Emergency Medicine | Admitting: Emergency Medicine

## 2023-07-28 DIAGNOSIS — Z8673 Personal history of transient ischemic attack (TIA), and cerebral infarction without residual deficits: Secondary | ICD-10-CM | POA: Diagnosis not present

## 2023-07-28 DIAGNOSIS — F0390 Unspecified dementia without behavioral disturbance: Secondary | ICD-10-CM | POA: Insufficient documentation

## 2023-07-28 DIAGNOSIS — J449 Chronic obstructive pulmonary disease, unspecified: Secondary | ICD-10-CM | POA: Insufficient documentation

## 2023-07-28 DIAGNOSIS — Z7982 Long term (current) use of aspirin: Secondary | ICD-10-CM | POA: Insufficient documentation

## 2023-07-28 DIAGNOSIS — I2510 Atherosclerotic heart disease of native coronary artery without angina pectoris: Secondary | ICD-10-CM | POA: Insufficient documentation

## 2023-07-28 DIAGNOSIS — Z1152 Encounter for screening for COVID-19: Secondary | ICD-10-CM | POA: Diagnosis not present

## 2023-07-28 DIAGNOSIS — R946 Abnormal results of thyroid function studies: Secondary | ICD-10-CM | POA: Insufficient documentation

## 2023-07-28 DIAGNOSIS — R531 Weakness: Secondary | ICD-10-CM | POA: Insufficient documentation

## 2023-07-28 DIAGNOSIS — Z79899 Other long term (current) drug therapy: Secondary | ICD-10-CM | POA: Diagnosis not present

## 2023-07-28 DIAGNOSIS — R4182 Altered mental status, unspecified: Secondary | ICD-10-CM | POA: Diagnosis not present

## 2023-07-28 DIAGNOSIS — R410 Disorientation, unspecified: Secondary | ICD-10-CM | POA: Insufficient documentation

## 2023-07-28 DIAGNOSIS — I10 Essential (primary) hypertension: Secondary | ICD-10-CM | POA: Insufficient documentation

## 2023-07-28 DIAGNOSIS — R7989 Other specified abnormal findings of blood chemistry: Secondary | ICD-10-CM

## 2023-07-28 LAB — CBC WITH DIFFERENTIAL/PLATELET
Abs Immature Granulocytes: 0.01 10*3/uL (ref 0.00–0.07)
Basophils Absolute: 0.1 10*3/uL (ref 0.0–0.1)
Basophils Relative: 1 %
Eosinophils Absolute: 0.1 10*3/uL (ref 0.0–0.5)
Eosinophils Relative: 1 %
HCT: 41.3 % (ref 36.0–46.0)
Hemoglobin: 12.9 g/dL (ref 12.0–15.0)
Immature Granulocytes: 0 %
Lymphocytes Relative: 26 %
Lymphs Abs: 1.3 10*3/uL (ref 0.7–4.0)
MCH: 28.2 pg (ref 26.0–34.0)
MCHC: 31.2 g/dL (ref 30.0–36.0)
MCV: 90.2 fL (ref 80.0–100.0)
Monocytes Absolute: 0.5 10*3/uL (ref 0.1–1.0)
Monocytes Relative: 9 %
Neutro Abs: 3.2 10*3/uL (ref 1.7–7.7)
Neutrophils Relative %: 63 %
Platelets: 289 10*3/uL (ref 150–400)
RBC: 4.58 MIL/uL (ref 3.87–5.11)
RDW: 15.9 % — ABNORMAL HIGH (ref 11.5–15.5)
WBC: 5.1 10*3/uL (ref 4.0–10.5)
nRBC: 0 % (ref 0.0–0.2)

## 2023-07-28 LAB — TROPONIN I (HIGH SENSITIVITY)
Troponin I (High Sensitivity): 8 ng/L (ref <18)
Troponin I (High Sensitivity): 10 ng/L (ref <18)

## 2023-07-28 LAB — URINALYSIS, ROUTINE W REFLEX MICROSCOPIC
Bilirubin Urine: NEGATIVE
Glucose, UA: NEGATIVE mg/dL
Hgb urine dipstick: NEGATIVE
Ketones, ur: NEGATIVE mg/dL
Leukocytes,Ua: NEGATIVE
Nitrite: NEGATIVE
Protein, ur: NEGATIVE mg/dL
Specific Gravity, Urine: 1.011 (ref 1.005–1.030)
pH: 8 (ref 5.0–8.0)

## 2023-07-28 LAB — COMPREHENSIVE METABOLIC PANEL
ALT: 15 U/L (ref 0–44)
AST: 31 U/L (ref 15–41)
Albumin: 3.5 g/dL (ref 3.5–5.0)
Alkaline Phosphatase: 68 U/L (ref 38–126)
Anion gap: 10 (ref 5–15)
BUN: 11 mg/dL (ref 8–23)
CO2: 26 mmol/L (ref 22–32)
Calcium: 9.6 mg/dL (ref 8.9–10.3)
Chloride: 109 mmol/L (ref 98–111)
Creatinine, Ser: 0.79 mg/dL (ref 0.44–1.00)
GFR, Estimated: >60 mL/min (ref >60)
Glucose, Bld: 96 mg/dL (ref 70–99)
Potassium: 3.9 mmol/L (ref 3.5–5.1)
Sodium: 145 mmol/L (ref 135–145)
Total Bilirubin: 1.3 mg/dL — ABNORMAL HIGH (ref 0.3–1.2)
Total Protein: 7.6 g/dL (ref 6.5–8.1)

## 2023-07-28 LAB — RESP PANEL BY RT-PCR (RSV, FLU A&B, COVID)  RVPGX2
Influenza A by PCR: NEGATIVE
Influenza B by PCR: NEGATIVE
Resp Syncytial Virus by PCR: NEGATIVE
SARS Coronavirus 2 by RT PCR: NEGATIVE

## 2023-07-28 LAB — TSH: TSH: 0.123 u[IU]/mL — ABNORMAL LOW (ref 0.350–4.500)

## 2023-07-28 LAB — MAGNESIUM: Magnesium: 2.2 mg/dL (ref 1.7–2.4)

## 2023-07-28 LAB — T4, FREE: Free T4: 1.5 ng/dL — ABNORMAL HIGH (ref 0.61–1.12)

## 2023-07-28 LAB — CK: Total CK: 164 U/L (ref 38–234)

## 2023-07-28 LAB — CBG MONITORING, ED: Glucose-Capillary: 90 mg/dL (ref 70–99)

## 2023-07-28 MED ORDER — HYDRALAZINE HCL 25 MG PO TABS
25.0000 mg | ORAL_TABLET | Freq: Every day | ORAL | Status: DC
  Administered 2023-07-28: 25 mg via ORAL
  Filled 2023-07-28: qty 1

## 2023-07-28 NOTE — Progress Notes (Addendum)
Transition of Care [facility]) - Emergency Department Mini Assessment   Patient Details  Name: Kristine [name] MRN: [id number] Date of Birth: Apr 29, 1941  Transition of Care [facility]) CM/SW Contact:    Georgie Chard, LCSW Phone Number: 07/28/2023, 9:10 PM   Clinical Narrative: CSW spoke to the patient's daughter about patient. Patient was recently DC from SNF/ [facility]  last month from [facility] ED. Upon review patient is currently living at home with spouse and has [facility] PT/ OT that was set up when DC from [facility]. At this time daughter is stated that the mom will need LTC in which they have been trying to apply for but stated that the patient was denied Medicaid. Daughter reports that she is unaware why at this time This CSW has advised the daughter to reach back out to medicaid to inquire again about getting the mom into a  LTC facility. At this time the daughter also states that she is unsure how the patient is medically clear. This CSW has made provider aware to reach out to the daughter to explain medical concerns to patient's daughter. This CSW has explained to the daughter that the patient will need to DC home and utilize the [facility] that was set up for the mom and continue to work with DSS in efforts to have the mom/ patient placed with LTC. CSW has made provider aware that once the patient was ready for DC and medically clear the patient will DC home with supports that were put in place.  TOC will follow up with [facility] company that family is using to add SW/AID.    Addend@ 9:34 pm  This CSW has advised daughter to report [facility] agency that they are currently using so that TOC can follow up to ensure that they are aware of new order. Daughter report's  that she believes its [facility] or center well. RNCM will follow up in AM.    ED Mini Assessment: What brought you to the Emergency Department? : Pt BIBA from home, husband reports increased weakness and decreased mobility, noted to have some shuffling gait;  patient has hx of lewy body dementia. Husband also reports patient having sweats and then complaining of being cold but pt is afebrile on arrival. Patient A&O to self only.  Barriers to Discharge: No Barriers Identified  Barrier interventions: Patient is in need of LTC  Means of departure: Ambulance  Interventions which prevented an admission or readmission: SNF Placement    Patient Contact and Communications     Spoke with: (P) Edwina Daughter Contact Date: (P) 07/28/23,   Contact time: (P) 2044 Contact Phone Number: (P) [phone] Call outcome: (P) Daughter states mom needs LTC medicad wad denyed.  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   Snoring 12/19/2020   Excessive daytime sleepiness 12/19/2020   Gait disturbance 12/19/2020   Pain due to onychomycosis of toenails of both feet 05/25/2019   Porokeratosis 05/25/2019   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   Angioedema 05/31/2015   Fever 03/11/2015   Acute blood loss anemia 03/11/2015   Melena 03/10/2015   GI bleed 03/09/2015   Peripheral arterial disease (HCC) 03/01/2012   CAD (coronary artery disease) 01/20/2012   HTN (hypertension)    Hyperlipidemia    PVD (peripheral vascular disease) (HCC)    COPD (chronic obstructive pulmonary disease) (HCC)    Hyperthyroidism    OA (osteoarthritis of spine)     STEMI (ST elevation myocardial infarction) (HCC) 04/07/2010   PCP:  Renaye Rakers, MD Pharmacy:   [facility] [address], [place] - [address] [place] [address] Phone: [phone] Fax: [phone]  [facility] #[id number] [place], [place] - [address] AT [facility] [address] [place] [address] Phone: [phone] Fax: [phone]  [facility] [place], [place] - [address] [place] [address] Phone: [phone] Fax: [phone]  [facility] #[id number] - [place], [place] - [address] AT [facility] [address] [place] [address] Phone: [phone] Fax: [phone]

## 2023-07-28 NOTE — ED Notes (Signed)
RN attempted to call pt's daughter Sheral Flow to let her know scans have been changed to "without contrast".

## 2023-07-28 NOTE — ED Notes (Signed)
RN talked to pt daughter about the change in contrast for scans.

## 2023-07-28 NOTE — ED Notes (Signed)
2 goldtops sent dow for thyroid tests

## 2023-07-28 NOTE — Discharge Instructions (Addendum)
Your thyroid-stimulating hormone level was low.  This can indicate high levels of thyroid hormones.  Labs to check these thyroid hormone levels were sent today.  Results often take 24 hours and should be available tomorrow.  You should follow-up on these results and discuss with primary care doctor.  You may need decreased dose of your Synthroid.  The remaining lab work today was completely normal.  CT and MRI of your brain did not show any new findings.  Social work should be reaching out for home health aide and social work support at home.  Return to emergency department for any new or worsening symptoms of concern.

## 2023-07-28 NOTE — ED Provider Notes (Signed)
[facility] EMERGENCY DEPARTMENT AT [facility] Provider Note   CSN: [id number] Arrival date & time: 07/28/23  1437     History  Chief Complaint  Patient presents with   Weakness    Kristine [name] is a 82 y.o. female.  HPI Patient presents for generalized weakness.  Medical history includes HTN, CAD, COPD, HLD, arthritis, CVA, dementia.  She arrives via EMS from home.  She lives with her husband.  Husband reports that she is currently at her mental baseline, oriented only to self.  Over the past month, she has had progressive generalized weakness.  She is ambulatory with assistance but has had decreased physical mobility.  Patient, herself, denies any complaints currently.  EMS noted normal vital signs prior to arrival.  Per chart review, patient presented to the ED 2 months ago with similar complaint.  She was medically cleared at that time and stayed in the ED pending social work evaluation for placement.  She was discharged 3 days later to [facility].  She has since returned home.  Since returning home, she has had sleep difficulty.  Daughters had concerns that this is due to patient's has been leaving TV and lights on at night.  History per husband: Patient returned home from [place] 2 weeks ago.  She has been undergoing physical therapy at home.  Last PT session was 2 days ago.  Although she was doing okay with a cane, she has declined and is now using a walker.  She has been eating okay.  Bowel movements have been regular.  Last BM was yesterday.  Over the past 2 days, patient's husband has noticed a generalized weakness.    Home Medications Prior to Admission medications   Medication Sig Start Date End Date Taking? Authorizing Provider  aspirin EC 81 MG tablet Take 81 mg by mouth daily.    [provider]  atorvastatin (LIPITOR) 80 MG tablet Take 1 tablet (80 mg total) by mouth daily. 04/08/23   Kathleene Hazel, MD  BREZTRI AEROSPHERE 160-9-4.8 MCG/ACT  AERO Inhale 2 puffs into the lungs 2 (two) times daily. 10/17/20   [provider]  clonazePAM (KLONOPIN) 1 MG tablet 1.5 pills po qHS Patient taking differently: Take 1.5 mg by mouth at bedtime. 1.5 pills po qHS 02/26/23   Sater, Pearletha Furl, MD  donepezil (ARICEPT) 10 MG tablet TAKE 1 TABLET(5 MG) BY MOUTH AT BEDTIME Patient taking differently: Take 10 mg by mouth See admin instructions. TAKE 1 TABLET(5 MG) BY MOUTH AT BEDTIME 02/11/23   Sater, Pearletha Furl, MD  ezetimibe (ZETIA) 10 MG tablet Take 1 tablet (10 mg total) by mouth daily. 04/08/23   Kathleene Hazel, MD  hydrALAZINE (APRESOLINE) 25 MG tablet Take 1/2 a tablet by mouth once a day Patient taking differently: Take 12.5 mg by mouth daily. 03/11/23   Dyann Kief, PA-C  hydrochlorothiazide (HYDRODIURIL) 25 MG tablet TAKE 1 TABLET(25 MG) BY MOUTH DAILY Patient taking differently: Take 25 mg by mouth daily. 02/02/23   Kathleene Hazel, MD  hydrOXYzine (ATARAX) 10 MG tablet Take 1 or 2 tablets by mouth twice a day as needed for agitation 01/22/23   Sater, Pearletha Furl, MD  levothyroxine (SYNTHROID) 50 MCG tablet Take 50 mcg by mouth every other day. 05/20/22   [provider]  metoprolol succinate (TOPROL-XL) 50 MG 24 hr tablet TAKE 1 TABLET(50 MG) BY MOUTH DAILY Patient taking differently: Take 25 mg by mouth See admin instructions. 02/02/23  Kathleene Hazel, MD  mirtazapine (REMERON) 15 MG tablet TAKE 1/2 TO 1 TABLET BY MOUTH EVERY NIGHT AT BEDTIME Patient taking differently: Take 7.5 mg by mouth at bedtime. 03/11/23   Sater, Pearletha Furl, MD  nitroGLYCERIN (NITROSTAT) 0.3 MG SL tablet Place 1 tablet (0.3 mg total) under the tongue every 5 (five) minutes as needed for chest pain. Max 3 doses 5 minutes apart per episode of chest pain. Call 911 if need 3rd dose. 04/08/23   Kathleene Hazel, MD  QUEtiapine (SEROQUEL) 25 MG tablet Take 1 tablet (25 mg total) by mouth at bedtime. 02/19/23   Sater, Pearletha Furl, MD   risperiDONE (RISPERDAL) 0.25 MG tablet Take 1 tablet (0.25 mg total) by mouth 2 (two) times daily as needed. Secondary to Lewy Body Dementia 12/17/22   Lovorn, Aundra Millet, MD      Allergies    Ace inhibitors, Iodinated contrast media, Other, Shellfish-derived products, and Hydrocodone    Review of Systems   Review of Systems  Unable to perform ROS: Dementia    Physical Exam Updated Vital Signs BP (!) 184/102   Pulse 75   Temp 98.2 F (36.8 C) (Oral)   Resp 14   Ht 5\' 4"  (1.626 m)   Wt 49.9 kg   SpO2 97%   BMI 18.88 kg/m  Physical Exam Vitals and nursing note reviewed.  Constitutional:      General: She is not in acute distress.    Appearance: Normal appearance. She is well-developed. She is not ill-appearing, toxic-appearing or diaphoretic.  HENT:     Head: Normocephalic and atraumatic.     Right Ear: External ear normal.     Left Ear: External ear normal.     Nose: Nose normal.     Mouth/Throat:     Mouth: Mucous membranes are moist.  Eyes:     Extraocular Movements: Extraocular movements intact.     Conjunctiva/sclera: Conjunctivae normal.  Cardiovascular:     Rate and Rhythm: Normal rate and regular rhythm.     Heart sounds: No murmur heard. Pulmonary:     Effort: Pulmonary effort is normal. No respiratory distress.     Breath sounds: Normal breath sounds. No wheezing or rales.  Chest:     Chest wall: No tenderness.  Abdominal:     General: There is no distension.     Palpations: Abdomen is soft.     Tenderness: There is no abdominal tenderness.  Musculoskeletal:        General: No swelling.     Cervical back: Normal range of motion and neck supple.     Right lower leg: No edema.     Left lower leg: No edema.  Skin:    General: Skin is warm and dry.     Capillary Refill: Capillary refill takes less than 2 seconds.     Coloration: Skin is not jaundiced or pale.  Neurological:     General: No focal deficit present.     Mental Status: She is alert. Mental  status is at baseline. She is disoriented.     Cranial Nerves: No cranial nerve deficit.     Sensory: No sensory deficit.     Motor: No weakness.     Coordination: Coordination normal.  Psychiatric:        Mood and Affect: Mood normal.        Behavior: Behavior normal.     ED Results / Procedures / Treatments   Labs (all labs ordered are listed,  but only abnormal results are displayed) Labs Reviewed  COMPREHENSIVE METABOLIC PANEL - Abnormal; Notable for the following components:      Result Value   Total Bilirubin 1.3 (*)    All other components within normal limits  CBC WITH DIFFERENTIAL/PLATELET - Abnormal; Notable for the following components:   RDW 15.9 (*)    All other components within normal limits  URINALYSIS, ROUTINE W REFLEX MICROSCOPIC - Abnormal; Notable for the following components:   APPearance CLOUDY (*)    All other components within normal limits  TSH - Abnormal; Notable for the following components:   TSH 0.123 (*)    All other components within normal limits  RESP PANEL BY RT-PCR (RSV, FLU A&B, COVID)  RVPGX2  MAGNESIUM  CK  T3, FREE  T4, FREE  CBG MONITORING, ED  TROPONIN I (HIGH SENSITIVITY)  TROPONIN I (HIGH SENSITIVITY)    EKG EKG Interpretation Date/Time:  Tuesday July 28 2023 15:11:17 EDT Ventricular Rate:  77 PR Interval:  151 QRS Duration:  111 QT Interval:  367 QTC Calculation: 416 R Axis:   36  Text Interpretation: Sinus rhythm RSR' in V1 or V2, right VCD or RVH Borderline T abnormalities, diffuse leads Confirmed by Gloris Manchester [phone]) on 07/28/2023 3:13:49 PM  Radiology MR BRAIN WO CONTRAST  Result Date: 07/28/2023 CLINICAL DATA:  Altered mental status EXAM: MRI HEAD WITHOUT CONTRAST TECHNIQUE: Multiplanar, multiecho pulse sequences of the brain and surrounding structures were obtained without intravenous contrast. COMPARISON:  06/07/2023 FINDINGS: Brain: No restricted diffusion to suggest acute or subacute infarct. No acute  hemorrhage, mass, mass effect, or midline shift. No hydrocephalus or extra-axial collection. Redemonstrated empty sella with expanded sella turcica, unchanged. Normal craniocervical junction. Punctate foci of hemosiderin deposition right temporal lobe and right basal ganglia, likely sequela of prior hypertensive microhemorrhage. Remote lacunar infarct in the left basal ganglia. Scattered T2 hyperintense signal in the periventricular white matter, likely the sequela of mild chronic small vessel ischemic disease. Vascular: Normal arterial flow voids. Skull and upper cervical spine: Normal marrow signal. Sinuses/Orbits: Clear paranasal sinuses. No acute finding in the orbits. Other: The mastoid air cells are well aerated. IMPRESSION: No acute intracranial process. No evidence of acute or subacute infarct. Electronically Signed   By: Wiliam Ke M.D.   On: 07/28/2023 20:47   CT Head Wo Contrast  Result Date: 07/28/2023 CLINICAL DATA:  Mental status change, unknown cause EXAM: CT HEAD WITHOUT CONTRAST TECHNIQUE: Contiguous axial images were obtained from the base of the skull through the vertex without intravenous contrast. RADIATION DOSE REDUCTION: This exam was performed according to the departmental dose-optimization program which includes automated exposure control, adjustment of the mA and/or kV according to patient size and/or use of iterative reconstruction technique. COMPARISON:  CT head June 07, 2023. FINDINGS: Brain: No evidence of acute infarction, hemorrhage, hydrocephalus, extra-axial collection or mass lesion/mass effect. Partially empty sella. Vascular: No hyperdense vessel identified. Skull: No acute fracture. Sinuses/Orbits: Clear sinuses.  No acute orbital findings. Other: No mastoid effusions. IMPRESSION: No evidence of acute intracranial abnormality. Electronically Signed   By: Feliberto Harts M.D.   On: 07/28/2023 18:20   DG Chest Portable 1 View  Result Date: 07/28/2023 CLINICAL DATA:   weakness EXAM: PORTABLE CHEST 1 VIEW COMPARISON:  CXR 06/07/23 FINDINGS: No pleural effusion. No pneumothorax. Normal cardiac and mediastinal contours. No focal airspace opacity. No radiographically apparent displaced rib fractures. Visualized upper abdomen unremarkable. IMPRESSION: No focal airspace opacity Electronically Signed   By: Sandria Senter  Celine Mans M.D.   On: 07/28/2023 16:48    Procedures Procedures    Medications Ordered in ED Medications  hydrALAZINE (APRESOLINE) tablet 25 mg (25 mg Oral Given 07/28/23 1846)    ED Course/ Medical Decision Making/ A&P                                 Medical Decision Making Amount and/or Complexity of Data Reviewed Labs: ordered. Radiology: ordered.  Risk Prescription drug management.   This patient presents to the ED for concern of generalized weakness, this involves an extensive number of treatment options, and is a complaint that carries with it a high risk of complications and morbidity.  The differential diagnosis includes deconditioning, progression of neurocognitive disorder, dehydration, polypharmacy, infection, metabolic derangements, CVA, TIA   Co morbidities that complicate the patient evaluation  HTN, CAD, COPD, HLD, arthritis, CVA, dementia   Additional history obtained:  Additional history obtained from EMS, patient's husband and daughter External records from outside source obtained and reviewed including EMR   Lab Tests:  I Ordered, and personally interpreted labs.  The pertinent results include: Normal hemoglobin, no leukocytosis, normal kidney function, normal electrolytes, no evidence of UTI, COVID/flu negative, normal troponin, normal CK.  TSH is low suggestive of hyperthyroidism.  This would be iatrogenic, as she does take Synthroid daily.  T3 and T4 pending.   Imaging Studies ordered:  I ordered imaging studies including chest x-ray, CT head, MRI brain I independently visualized and interpreted imaging which showed  no acute findings I agree with the radiologist interpretation   Cardiac Monitoring: / EKG:  The patient was maintained on a cardiac monitor.  I personally viewed and interpreted the cardiac monitored which showed an underlying rhythm of: Sinus rhythm   Consultations Obtained:  I requested consultation with the social work,  and discussed lab and imaging findings as well as pertinent plan - they recommend: Patient not a candidate for facility placement given that she already used up her 30 days 2 months ago.  Face-to-face orders to be placed for home health aide and social work support.   Problem List / ED Course / Critical interventions / Medication management  Patient with dementia, presenting from home by EMS for progressive generalized weakness, reportedly over the past month.  She is well-appearing on arrival and has no current complaints other than feeling cold.  Abdomen is soft and nontender.  She has no focal deficits.  Workup was initiated.  [name] subsequently arrived in the ED.  He provided further history.  He feels like she has had a physical decline over the past couple days.  Patient continues to rest comfortably.  Lab work was notable only for low TSH.  T3 and T4 levels were ordered.  I spoke with her husband who states that she was previously on daily Synthroid but this was reduced 2 months ago to every other day.  When she went to facility, they resumed her daily dosing and he has continued daily dosing at home for the past 2 weeks.  [name] was advised to follow-up on free T3 and T4 levels and speak to her primary care doctor about needed medication changes.  Patient had elevated blood pressure while in the ED.  Home dose of hydralazine was ordered.  On reassessment, she remains sitting on ED stretcher.  At this point, her daughter arrived.  [name] and daughter expressed frustration and sadness given  the patient's condition.  Patient has 2 daughters  but they are working most of the day.  Husband was having an increasingly difficult time caring for her at home.  Social work spoke with the family.  Because she used up for 30 days in facility, she would not be a candidate for placement from the ED.  She can seek long-term care through PCP.  Face-to-face orders were placed for home health aide and social work support.  Patient underwent MRI of brain which did not show acute findings.  She was discharged in stable condition. I ordered medication including hydralazine for HTN Reevaluation of the patient after these medicines showed that the patient stayed the same I have reviewed the patients home medicines and have made adjustments as needed   Social Determinants of Health:  Lives at home with husband        Final Clinical Impression(s) / ED Diagnoses Final diagnoses:  Generalized weakness  Low TSH level    Rx / DC Orders ED Discharge Orders     None         Gloris Manchester, MD 07/28/23 2210

## 2023-07-28 NOTE — ED Triage Notes (Signed)
Pt BIBA from home, husband reports increased weakness and decreased mobility, noted to have some shuffling gait; patient has hx of lewy body dementia. Husband also reports patient having sweats and then complaining of being cold but pt is afebrile on arrival. Patient A&O to self only.   T 97.9  BP 170/80 P 82 SpO2 96 RA CBG 134

## 2023-07-28 NOTE — ED Notes (Signed)
Call received from pt daughter Finis Bud [phone]- requesting CT orders to be modified d/t mother is allergic to contrast dye; request rtn call for pt status/updates. [employer]

## 2023-07-29 ENCOUNTER — Telehealth: Payer: Self-pay

## 2023-07-29 DIAGNOSIS — G3183 Dementia with Lewy bodies: Secondary | ICD-10-CM | POA: Diagnosis not present

## 2023-07-29 DIAGNOSIS — I739 Peripheral vascular disease, unspecified: Secondary | ICD-10-CM | POA: Diagnosis not present

## 2023-07-29 DIAGNOSIS — F0280 Dementia in other diseases classified elsewhere without behavioral disturbance: Secondary | ICD-10-CM | POA: Diagnosis not present

## 2023-07-29 DIAGNOSIS — I2510 Atherosclerotic heart disease of native coronary artery without angina pectoris: Secondary | ICD-10-CM | POA: Diagnosis not present

## 2023-07-29 DIAGNOSIS — E440 Moderate protein-calorie malnutrition: Secondary | ICD-10-CM | POA: Diagnosis not present

## 2023-07-29 DIAGNOSIS — I10 Essential (primary) hypertension: Secondary | ICD-10-CM | POA: Diagnosis not present

## 2023-07-29 DIAGNOSIS — E039 Hypothyroidism, unspecified: Secondary | ICD-10-CM | POA: Diagnosis not present

## 2023-07-29 DIAGNOSIS — I252 Old myocardial infarction: Secondary | ICD-10-CM | POA: Diagnosis not present

## 2023-07-29 DIAGNOSIS — J449 Chronic obstructive pulmonary disease, unspecified: Secondary | ICD-10-CM | POA: Diagnosis not present

## 2023-07-29 NOTE — Telephone Encounter (Signed)
Received message from ED SW re: [facility] services.  This RNCM spoke with patient's daughter Kristine [name] to advise [facility] will follow up to provide the following services: HHPT/OT, RN, SW. This RNCM notified Tresa Endo with [facility] who will continue to follow patient for [facility] services.Edwina verbalized understanding.   Patient's daughter Kristine [name] indicated she works 2 jobs and not able to be there continuous. Patient's other daughter lives next door however works 2 jobs and helps when she can. This RNCM advise Kristine [name] of private duty nursing and to contact patient's insurance to see if there are other services available via insurance. Patient is aware of [facility], however will follow up with them. Kristine [name] reports patient was denied for [facility] Medicaid.   Kristine [name] also questioning patient's thyroid medication changes. This RNCM encouraged her to call PCP office. Kristine [name] reports she has called awaiting a call back. This RNCM spoke with Dr. Tedra Senegal office and nurse line will call patient to schedule a follow up telehealth appointment. Dr. Tedra Senegal office reports patient has not been seen since June of 2023.   No additional TOC needs.

## 2023-07-30 DIAGNOSIS — J449 Chronic obstructive pulmonary disease, unspecified: Secondary | ICD-10-CM | POA: Diagnosis not present

## 2023-07-30 DIAGNOSIS — E039 Hypothyroidism, unspecified: Secondary | ICD-10-CM | POA: Diagnosis not present

## 2023-07-30 DIAGNOSIS — H908 Mixed conductive and sensorineural hearing loss, unspecified: Secondary | ICD-10-CM | POA: Diagnosis not present

## 2023-07-30 DIAGNOSIS — I10 Essential (primary) hypertension: Secondary | ICD-10-CM | POA: Diagnosis not present

## 2023-07-30 DIAGNOSIS — E1169 Type 2 diabetes mellitus with other specified complication: Secondary | ICD-10-CM | POA: Diagnosis not present

## 2023-07-30 DIAGNOSIS — G464 Cerebellar stroke syndrome: Secondary | ICD-10-CM | POA: Diagnosis not present

## 2023-07-30 DIAGNOSIS — G3183 Dementia with Lewy bodies: Secondary | ICD-10-CM | POA: Diagnosis not present

## 2023-07-30 DIAGNOSIS — R413 Other amnesia: Secondary | ICD-10-CM | POA: Diagnosis not present

## 2023-07-30 DIAGNOSIS — E440 Moderate protein-calorie malnutrition: Secondary | ICD-10-CM | POA: Diagnosis not present

## 2023-07-30 DIAGNOSIS — F0280 Dementia in other diseases classified elsewhere without behavioral disturbance: Secondary | ICD-10-CM | POA: Diagnosis not present

## 2023-07-30 DIAGNOSIS — I2510 Atherosclerotic heart disease of native coronary artery without angina pectoris: Secondary | ICD-10-CM | POA: Diagnosis not present

## 2023-07-30 DIAGNOSIS — I252 Old myocardial infarction: Secondary | ICD-10-CM | POA: Diagnosis not present

## 2023-07-30 DIAGNOSIS — I739 Peripheral vascular disease, unspecified: Secondary | ICD-10-CM | POA: Diagnosis not present

## 2023-07-30 LAB — T3, FREE: T3, Free: 2.5 pg/mL (ref 2.0–4.4)

## 2023-07-31 DIAGNOSIS — F0280 Dementia in other diseases classified elsewhere without behavioral disturbance: Secondary | ICD-10-CM | POA: Diagnosis not present

## 2023-07-31 DIAGNOSIS — I739 Peripheral vascular disease, unspecified: Secondary | ICD-10-CM | POA: Diagnosis not present

## 2023-07-31 DIAGNOSIS — I2510 Atherosclerotic heart disease of native coronary artery without angina pectoris: Secondary | ICD-10-CM | POA: Diagnosis not present

## 2023-07-31 DIAGNOSIS — I252 Old myocardial infarction: Secondary | ICD-10-CM | POA: Diagnosis not present

## 2023-07-31 DIAGNOSIS — I10 Essential (primary) hypertension: Secondary | ICD-10-CM | POA: Diagnosis not present

## 2023-07-31 DIAGNOSIS — E039 Hypothyroidism, unspecified: Secondary | ICD-10-CM | POA: Diagnosis not present

## 2023-07-31 DIAGNOSIS — E440 Moderate protein-calorie malnutrition: Secondary | ICD-10-CM | POA: Diagnosis not present

## 2023-07-31 DIAGNOSIS — G3183 Dementia with Lewy bodies: Secondary | ICD-10-CM | POA: Diagnosis not present

## 2023-07-31 DIAGNOSIS — J449 Chronic obstructive pulmonary disease, unspecified: Secondary | ICD-10-CM | POA: Diagnosis not present

## 2023-08-03 DIAGNOSIS — I10 Essential (primary) hypertension: Secondary | ICD-10-CM | POA: Diagnosis not present

## 2023-08-03 DIAGNOSIS — F0280 Dementia in other diseases classified elsewhere without behavioral disturbance: Secondary | ICD-10-CM | POA: Diagnosis not present

## 2023-08-03 DIAGNOSIS — E440 Moderate protein-calorie malnutrition: Secondary | ICD-10-CM | POA: Diagnosis not present

## 2023-08-03 DIAGNOSIS — I2510 Atherosclerotic heart disease of native coronary artery without angina pectoris: Secondary | ICD-10-CM | POA: Diagnosis not present

## 2023-08-03 DIAGNOSIS — I739 Peripheral vascular disease, unspecified: Secondary | ICD-10-CM | POA: Diagnosis not present

## 2023-08-03 DIAGNOSIS — G3183 Dementia with Lewy bodies: Secondary | ICD-10-CM | POA: Diagnosis not present

## 2023-08-03 DIAGNOSIS — E039 Hypothyroidism, unspecified: Secondary | ICD-10-CM | POA: Diagnosis not present

## 2023-08-03 DIAGNOSIS — I252 Old myocardial infarction: Secondary | ICD-10-CM | POA: Diagnosis not present

## 2023-08-03 DIAGNOSIS — J449 Chronic obstructive pulmonary disease, unspecified: Secondary | ICD-10-CM | POA: Diagnosis not present

## 2023-08-04 DIAGNOSIS — E440 Moderate protein-calorie malnutrition: Secondary | ICD-10-CM | POA: Diagnosis not present

## 2023-08-04 DIAGNOSIS — G3183 Dementia with Lewy bodies: Secondary | ICD-10-CM | POA: Diagnosis not present

## 2023-08-04 DIAGNOSIS — I10 Essential (primary) hypertension: Secondary | ICD-10-CM | POA: Diagnosis not present

## 2023-08-04 DIAGNOSIS — I2510 Atherosclerotic heart disease of native coronary artery without angina pectoris: Secondary | ICD-10-CM | POA: Diagnosis not present

## 2023-08-04 DIAGNOSIS — J449 Chronic obstructive pulmonary disease, unspecified: Secondary | ICD-10-CM | POA: Diagnosis not present

## 2023-08-04 DIAGNOSIS — I739 Peripheral vascular disease, unspecified: Secondary | ICD-10-CM | POA: Diagnosis not present

## 2023-08-04 DIAGNOSIS — E039 Hypothyroidism, unspecified: Secondary | ICD-10-CM | POA: Diagnosis not present

## 2023-08-04 DIAGNOSIS — F0280 Dementia in other diseases classified elsewhere without behavioral disturbance: Secondary | ICD-10-CM | POA: Diagnosis not present

## 2023-08-04 DIAGNOSIS — I252 Old myocardial infarction: Secondary | ICD-10-CM | POA: Diagnosis not present

## 2023-08-05 DIAGNOSIS — G3183 Dementia with Lewy bodies: Secondary | ICD-10-CM | POA: Diagnosis not present

## 2023-08-05 DIAGNOSIS — E039 Hypothyroidism, unspecified: Secondary | ICD-10-CM | POA: Diagnosis not present

## 2023-08-05 DIAGNOSIS — I2510 Atherosclerotic heart disease of native coronary artery without angina pectoris: Secondary | ICD-10-CM | POA: Diagnosis not present

## 2023-08-05 DIAGNOSIS — J449 Chronic obstructive pulmonary disease, unspecified: Secondary | ICD-10-CM | POA: Diagnosis not present

## 2023-08-05 DIAGNOSIS — I739 Peripheral vascular disease, unspecified: Secondary | ICD-10-CM | POA: Diagnosis not present

## 2023-08-05 DIAGNOSIS — E440 Moderate protein-calorie malnutrition: Secondary | ICD-10-CM | POA: Diagnosis not present

## 2023-08-05 DIAGNOSIS — F0280 Dementia in other diseases classified elsewhere without behavioral disturbance: Secondary | ICD-10-CM | POA: Diagnosis not present

## 2023-08-05 DIAGNOSIS — I252 Old myocardial infarction: Secondary | ICD-10-CM | POA: Diagnosis not present

## 2023-08-05 DIAGNOSIS — I10 Essential (primary) hypertension: Secondary | ICD-10-CM | POA: Diagnosis not present

## 2023-08-06 DIAGNOSIS — G3183 Dementia with Lewy bodies: Secondary | ICD-10-CM | POA: Diagnosis not present

## 2023-08-06 DIAGNOSIS — I739 Peripheral vascular disease, unspecified: Secondary | ICD-10-CM | POA: Diagnosis not present

## 2023-08-06 DIAGNOSIS — J449 Chronic obstructive pulmonary disease, unspecified: Secondary | ICD-10-CM | POA: Diagnosis not present

## 2023-08-06 DIAGNOSIS — I10 Essential (primary) hypertension: Secondary | ICD-10-CM | POA: Diagnosis not present

## 2023-08-06 DIAGNOSIS — E039 Hypothyroidism, unspecified: Secondary | ICD-10-CM | POA: Diagnosis not present

## 2023-08-06 DIAGNOSIS — E440 Moderate protein-calorie malnutrition: Secondary | ICD-10-CM | POA: Diagnosis not present

## 2023-08-06 DIAGNOSIS — I2510 Atherosclerotic heart disease of native coronary artery without angina pectoris: Secondary | ICD-10-CM | POA: Diagnosis not present

## 2023-08-06 DIAGNOSIS — F0280 Dementia in other diseases classified elsewhere without behavioral disturbance: Secondary | ICD-10-CM | POA: Diagnosis not present

## 2023-08-06 DIAGNOSIS — I252 Old myocardial infarction: Secondary | ICD-10-CM | POA: Diagnosis not present

## 2023-08-11 DIAGNOSIS — E440 Moderate protein-calorie malnutrition: Secondary | ICD-10-CM | POA: Diagnosis not present

## 2023-08-11 DIAGNOSIS — F0280 Dementia in other diseases classified elsewhere without behavioral disturbance: Secondary | ICD-10-CM | POA: Diagnosis not present

## 2023-08-11 DIAGNOSIS — I739 Peripheral vascular disease, unspecified: Secondary | ICD-10-CM | POA: Diagnosis not present

## 2023-08-11 DIAGNOSIS — I10 Essential (primary) hypertension: Secondary | ICD-10-CM | POA: Diagnosis not present

## 2023-08-11 DIAGNOSIS — J449 Chronic obstructive pulmonary disease, unspecified: Secondary | ICD-10-CM | POA: Diagnosis not present

## 2023-08-11 DIAGNOSIS — E039 Hypothyroidism, unspecified: Secondary | ICD-10-CM | POA: Diagnosis not present

## 2023-08-11 DIAGNOSIS — I252 Old myocardial infarction: Secondary | ICD-10-CM | POA: Diagnosis not present

## 2023-08-11 DIAGNOSIS — I2510 Atherosclerotic heart disease of native coronary artery without angina pectoris: Secondary | ICD-10-CM | POA: Diagnosis not present

## 2023-08-11 DIAGNOSIS — G3183 Dementia with Lewy bodies: Secondary | ICD-10-CM | POA: Diagnosis not present

## 2023-08-11 NOTE — Telephone Encounter (Addendum)
Pt's daughter, Sheral Flow would like a call back to discuss prescribing Melatonin to help pt sleep. Pt is not sleep and husband is not able to get any sleep.

## 2023-08-11 NOTE — Telephone Encounter (Signed)
Spoke with daughter and she stated that pt isn't sleeping which is causes them both to be up at all times of the night. Pt's Daughter states that pt has a Thyroid conditions that causes her to be hot and cold and keeps her up, to pt's daughter Dr. Epimenio Foot will be notified about her concerns.

## 2023-08-11 NOTE — Telephone Encounter (Signed)
Pt's Husband called and stated that he is wanting something to help his wife sleep. Pt's husband states that pt doesn't sleep long and is back up 30 mins after she goes to bed. Pt's husband states that pt will cat nap on the couch and is up halfway through the night. Pt's husband express frustration as he is 80 years old and is unable to go to sleep due to pt being up. Pt's husband is begging for something for pt so they both can get rest.

## 2023-08-11 NOTE — Telephone Encounter (Signed)
Patients daughter called in asking to speak with one of the nurses regarding her mother, specifically requested to speak with Khadijah. At the time, Mack Hook was not available to speak with. I advised the daughter of same, and advised that we had already spoken with Ms Nine husband about her sleep concerns and that a message had been sent to Dr Epimenio Foot requesting advice. At that point, she advised that she was unsure why anyone reached out to her father as she was not with her father and she lived two hours away. I let her know that her father had called in and was transferred to [facility] to discuss the sleep concerns. I asked if she was calling regarding the same concerns or if there were new concerns, and at that point she got emotional and advised that she felt like no one in this office was taking her concerns seriously and that she no longer had anything else to say to our office as we were not helping. Before I could interject, the phone was disconnected on her end.

## 2023-08-11 NOTE — Telephone Encounter (Signed)
LVM for pt's daughter to call back before 5pm today

## 2023-08-12 DIAGNOSIS — I252 Old myocardial infarction: Secondary | ICD-10-CM | POA: Diagnosis not present

## 2023-08-12 DIAGNOSIS — F0280 Dementia in other diseases classified elsewhere without behavioral disturbance: Secondary | ICD-10-CM | POA: Diagnosis not present

## 2023-08-12 DIAGNOSIS — I10 Essential (primary) hypertension: Secondary | ICD-10-CM | POA: Diagnosis not present

## 2023-08-12 DIAGNOSIS — J449 Chronic obstructive pulmonary disease, unspecified: Secondary | ICD-10-CM | POA: Diagnosis not present

## 2023-08-12 DIAGNOSIS — G3183 Dementia with Lewy bodies: Secondary | ICD-10-CM | POA: Diagnosis not present

## 2023-08-12 DIAGNOSIS — I739 Peripheral vascular disease, unspecified: Secondary | ICD-10-CM | POA: Diagnosis not present

## 2023-08-12 DIAGNOSIS — I2510 Atherosclerotic heart disease of native coronary artery without angina pectoris: Secondary | ICD-10-CM | POA: Diagnosis not present

## 2023-08-12 DIAGNOSIS — E039 Hypothyroidism, unspecified: Secondary | ICD-10-CM | POA: Diagnosis not present

## 2023-08-12 DIAGNOSIS — E440 Moderate protein-calorie malnutrition: Secondary | ICD-10-CM | POA: Diagnosis not present

## 2023-08-12 NOTE — Telephone Encounter (Signed)
LVM detailing per Dr. Epimenio Foot "She is already on a couple medicines to try to help sleep.  If the thyroid issues are causing her to be uncomfortable at night, she should discuss this further with primary care to see if some adjustment is necessary. "

## 2023-08-13 DIAGNOSIS — I739 Peripheral vascular disease, unspecified: Secondary | ICD-10-CM | POA: Diagnosis not present

## 2023-08-13 DIAGNOSIS — J449 Chronic obstructive pulmonary disease, unspecified: Secondary | ICD-10-CM | POA: Diagnosis not present

## 2023-08-13 DIAGNOSIS — F0280 Dementia in other diseases classified elsewhere without behavioral disturbance: Secondary | ICD-10-CM | POA: Diagnosis not present

## 2023-08-13 DIAGNOSIS — I252 Old myocardial infarction: Secondary | ICD-10-CM | POA: Diagnosis not present

## 2023-08-13 DIAGNOSIS — G3183 Dementia with Lewy bodies: Secondary | ICD-10-CM | POA: Diagnosis not present

## 2023-08-13 DIAGNOSIS — I10 Essential (primary) hypertension: Secondary | ICD-10-CM | POA: Diagnosis not present

## 2023-08-13 DIAGNOSIS — E039 Hypothyroidism, unspecified: Secondary | ICD-10-CM | POA: Diagnosis not present

## 2023-08-13 DIAGNOSIS — E440 Moderate protein-calorie malnutrition: Secondary | ICD-10-CM | POA: Diagnosis not present

## 2023-08-13 DIAGNOSIS — I2510 Atherosclerotic heart disease of native coronary artery without angina pectoris: Secondary | ICD-10-CM | POA: Diagnosis not present

## 2023-08-14 ENCOUNTER — Inpatient Hospital Stay (HOSPITAL_COMMUNITY)
Admission: EM | Admit: 2023-08-14 | Discharge: 2023-08-21 | DRG: 056 | Disposition: A | Discharge status: Skilled Nursing Facility | Payer: Medicare PPO | Attending: Internal Medicine | Admitting: Internal Medicine

## 2023-08-14 ENCOUNTER — Encounter (HOSPITAL_COMMUNITY): Payer: Self-pay | Admitting: Emergency Medicine

## 2023-08-14 ENCOUNTER — Other Ambulatory Visit: Payer: Self-pay

## 2023-08-14 DIAGNOSIS — Z66 Do not resuscitate: Secondary | ICD-10-CM | POA: Diagnosis not present

## 2023-08-14 DIAGNOSIS — Z7982 Long term (current) use of aspirin: Secondary | ICD-10-CM | POA: Diagnosis not present

## 2023-08-14 DIAGNOSIS — Z6841 Body Mass Index (BMI) 40.0 and over, adult: Secondary | ICD-10-CM | POA: Diagnosis not present

## 2023-08-14 DIAGNOSIS — I6782 Cerebral ischemia: Secondary | ICD-10-CM | POA: Diagnosis not present

## 2023-08-14 DIAGNOSIS — Z681 Body mass index (BMI) 19 or less, adult: Secondary | ICD-10-CM

## 2023-08-14 DIAGNOSIS — G3183 Dementia with Lewy bodies: Secondary | ICD-10-CM | POA: Diagnosis present

## 2023-08-14 DIAGNOSIS — Z7189 Other specified counseling: Secondary | ICD-10-CM | POA: Diagnosis not present

## 2023-08-14 DIAGNOSIS — Z515 Encounter for palliative care: Secondary | ICD-10-CM | POA: Diagnosis not present

## 2023-08-14 DIAGNOSIS — M6281 Muscle weakness (generalized): Secondary | ICD-10-CM | POA: Diagnosis not present

## 2023-08-14 DIAGNOSIS — I2510 Atherosclerotic heart disease of native coronary artery without angina pectoris: Secondary | ICD-10-CM | POA: Diagnosis present

## 2023-08-14 DIAGNOSIS — E785 Hyperlipidemia, unspecified: Secondary | ICD-10-CM | POA: Diagnosis present

## 2023-08-14 DIAGNOSIS — R627 Adult failure to thrive: Secondary | ICD-10-CM | POA: Diagnosis present

## 2023-08-14 DIAGNOSIS — Z7401 Bed confinement status: Secondary | ICD-10-CM | POA: Diagnosis not present

## 2023-08-14 DIAGNOSIS — J449 Chronic obstructive pulmonary disease, unspecified: Secondary | ICD-10-CM | POA: Diagnosis present

## 2023-08-14 DIAGNOSIS — I110 Hypertensive heart disease with heart failure: Secondary | ICD-10-CM | POA: Diagnosis present

## 2023-08-14 DIAGNOSIS — I252 Old myocardial infarction: Secondary | ICD-10-CM | POA: Diagnosis not present

## 2023-08-14 DIAGNOSIS — Z79899 Other long term (current) drug therapy: Secondary | ICD-10-CM | POA: Diagnosis not present

## 2023-08-14 DIAGNOSIS — R262 Difficulty in walking, not elsewhere classified: Secondary | ICD-10-CM | POA: Diagnosis present

## 2023-08-14 DIAGNOSIS — E876 Hypokalemia: Secondary | ICD-10-CM | POA: Diagnosis present

## 2023-08-14 DIAGNOSIS — R531 Weakness: Secondary | ICD-10-CM | POA: Diagnosis not present

## 2023-08-14 DIAGNOSIS — G9341 Metabolic encephalopathy: Secondary | ICD-10-CM | POA: Diagnosis present

## 2023-08-14 DIAGNOSIS — R4182 Altered mental status, unspecified: Principal | ICD-10-CM | POA: Diagnosis present

## 2023-08-14 DIAGNOSIS — F02C0 Dementia in other diseases classified elsewhere, severe, without behavioral disturbance, psychotic disturbance, mood disturbance, and anxiety: Secondary | ICD-10-CM | POA: Diagnosis not present

## 2023-08-14 DIAGNOSIS — Z79891 Long term (current) use of opiate analgesic: Secondary | ICD-10-CM | POA: Diagnosis not present

## 2023-08-14 DIAGNOSIS — I5032 Chronic diastolic (congestive) heart failure: Secondary | ICD-10-CM | POA: Diagnosis present

## 2023-08-14 DIAGNOSIS — R630 Anorexia: Secondary | ICD-10-CM | POA: Diagnosis present

## 2023-08-14 DIAGNOSIS — I739 Peripheral vascular disease, unspecified: Secondary | ICD-10-CM | POA: Diagnosis present

## 2023-08-14 DIAGNOSIS — Z7951 Long term (current) use of inhaled steroids: Secondary | ICD-10-CM | POA: Diagnosis not present

## 2023-08-14 DIAGNOSIS — E0590 Thyrotoxicosis, unspecified without thyrotoxic crisis or storm: Secondary | ICD-10-CM | POA: Diagnosis present

## 2023-08-14 DIAGNOSIS — Z91041 Radiographic dye allergy status: Secondary | ICD-10-CM

## 2023-08-14 DIAGNOSIS — Z87892 Personal history of anaphylaxis: Secondary | ICD-10-CM

## 2023-08-14 DIAGNOSIS — F0280 Dementia in other diseases classified elsewhere without behavioral disturbance: Secondary | ICD-10-CM | POA: Diagnosis not present

## 2023-08-14 DIAGNOSIS — R1311 Dysphagia, oral phase: Secondary | ICD-10-CM | POA: Diagnosis not present

## 2023-08-14 DIAGNOSIS — F0284 Dementia in other diseases classified elsewhere, unspecified severity, with anxiety: Secondary | ICD-10-CM | POA: Diagnosis present

## 2023-08-14 DIAGNOSIS — Z8673 Personal history of transient ischemic attack (TIA), and cerebral infarction without residual deficits: Secondary | ICD-10-CM

## 2023-08-14 DIAGNOSIS — Z87891 Personal history of nicotine dependence: Secondary | ICD-10-CM

## 2023-08-14 DIAGNOSIS — Z955 Presence of coronary angioplasty implant and graft: Secondary | ICD-10-CM

## 2023-08-14 DIAGNOSIS — Z888 Allergy status to other drugs, medicaments and biological substances status: Secondary | ICD-10-CM

## 2023-08-14 DIAGNOSIS — R2681 Unsteadiness on feet: Secondary | ICD-10-CM | POA: Diagnosis not present

## 2023-08-14 DIAGNOSIS — Z91013 Allergy to seafood: Secondary | ICD-10-CM

## 2023-08-14 DIAGNOSIS — Z8701 Personal history of pneumonia (recurrent): Secondary | ICD-10-CM

## 2023-08-14 DIAGNOSIS — R41841 Cognitive communication deficit: Secondary | ICD-10-CM | POA: Diagnosis not present

## 2023-08-14 DIAGNOSIS — Z885 Allergy status to narcotic agent status: Secondary | ICD-10-CM

## 2023-08-14 DIAGNOSIS — I10 Essential (primary) hypertension: Secondary | ICD-10-CM | POA: Diagnosis not present

## 2023-08-14 DIAGNOSIS — F02C18 Dementia in other diseases classified elsewhere, severe, with other behavioral disturbance: Secondary | ICD-10-CM | POA: Diagnosis not present

## 2023-08-14 DIAGNOSIS — Z8249 Family history of ischemic heart disease and other diseases of the circulatory system: Secondary | ICD-10-CM

## 2023-08-14 DIAGNOSIS — G9389 Other specified disorders of brain: Secondary | ICD-10-CM | POA: Diagnosis not present

## 2023-08-14 DIAGNOSIS — Z90710 Acquired absence of both cervix and uterus: Secondary | ICD-10-CM

## 2023-08-14 DIAGNOSIS — Z96641 Presence of right artificial hip joint: Secondary | ICD-10-CM | POA: Diagnosis present

## 2023-08-14 NOTE — ED Triage Notes (Signed)
Patient [name] from home c/o FTT.  Family reports that patient recently released from [facility] and has been sleeping a lot more than normal at home and hard to engage.  [name] reports problems with her thyroid but is unsure what.

## 2023-08-15 ENCOUNTER — Emergency Department (HOSPITAL_COMMUNITY): Payer: Medicare PPO

## 2023-08-15 DIAGNOSIS — E0590 Thyrotoxicosis, unspecified without thyrotoxic crisis or storm: Secondary | ICD-10-CM | POA: Diagnosis present

## 2023-08-15 DIAGNOSIS — E876 Hypokalemia: Secondary | ICD-10-CM | POA: Diagnosis present

## 2023-08-15 DIAGNOSIS — Z515 Encounter for palliative care: Secondary | ICD-10-CM | POA: Diagnosis not present

## 2023-08-15 DIAGNOSIS — R4182 Altered mental status, unspecified: Secondary | ICD-10-CM | POA: Diagnosis not present

## 2023-08-15 DIAGNOSIS — R630 Anorexia: Secondary | ICD-10-CM | POA: Diagnosis present

## 2023-08-15 DIAGNOSIS — F02C0 Dementia in other diseases classified elsewhere, severe, without behavioral disturbance, psychotic disturbance, mood disturbance, and anxiety: Secondary | ICD-10-CM | POA: Diagnosis not present

## 2023-08-15 DIAGNOSIS — Z681 Body mass index (BMI) 19 or less, adult: Secondary | ICD-10-CM | POA: Diagnosis not present

## 2023-08-15 DIAGNOSIS — Z7982 Long term (current) use of aspirin: Secondary | ICD-10-CM | POA: Diagnosis not present

## 2023-08-15 DIAGNOSIS — Z6841 Body Mass Index (BMI) 40.0 and over, adult: Secondary | ICD-10-CM | POA: Diagnosis not present

## 2023-08-15 DIAGNOSIS — I739 Peripheral vascular disease, unspecified: Secondary | ICD-10-CM | POA: Diagnosis present

## 2023-08-15 DIAGNOSIS — E785 Hyperlipidemia, unspecified: Secondary | ICD-10-CM | POA: Diagnosis present

## 2023-08-15 DIAGNOSIS — G9341 Metabolic encephalopathy: Secondary | ICD-10-CM

## 2023-08-15 DIAGNOSIS — G3183 Dementia with Lewy bodies: Secondary | ICD-10-CM | POA: Diagnosis present

## 2023-08-15 DIAGNOSIS — Z7951 Long term (current) use of inhaled steroids: Secondary | ICD-10-CM | POA: Diagnosis not present

## 2023-08-15 DIAGNOSIS — R627 Adult failure to thrive: Secondary | ICD-10-CM | POA: Diagnosis present

## 2023-08-15 DIAGNOSIS — J449 Chronic obstructive pulmonary disease, unspecified: Secondary | ICD-10-CM | POA: Diagnosis present

## 2023-08-15 DIAGNOSIS — F0284 Dementia in other diseases classified elsewhere, unspecified severity, with anxiety: Secondary | ICD-10-CM | POA: Diagnosis present

## 2023-08-15 DIAGNOSIS — F0280 Dementia in other diseases classified elsewhere without behavioral disturbance: Secondary | ICD-10-CM | POA: Diagnosis not present

## 2023-08-15 DIAGNOSIS — I5032 Chronic diastolic (congestive) heart failure: Secondary | ICD-10-CM | POA: Diagnosis present

## 2023-08-15 DIAGNOSIS — Z79899 Other long term (current) drug therapy: Secondary | ICD-10-CM | POA: Diagnosis not present

## 2023-08-15 DIAGNOSIS — Z955 Presence of coronary angioplasty implant and graft: Secondary | ICD-10-CM | POA: Diagnosis not present

## 2023-08-15 DIAGNOSIS — I110 Hypertensive heart disease with heart failure: Secondary | ICD-10-CM | POA: Diagnosis present

## 2023-08-15 DIAGNOSIS — Z79891 Long term (current) use of opiate analgesic: Secondary | ICD-10-CM | POA: Diagnosis not present

## 2023-08-15 DIAGNOSIS — F02C18 Dementia in other diseases classified elsewhere, severe, with other behavioral disturbance: Secondary | ICD-10-CM | POA: Diagnosis not present

## 2023-08-15 DIAGNOSIS — I2510 Atherosclerotic heart disease of native coronary artery without angina pectoris: Secondary | ICD-10-CM | POA: Diagnosis present

## 2023-08-15 DIAGNOSIS — Z7189 Other specified counseling: Secondary | ICD-10-CM | POA: Diagnosis not present

## 2023-08-15 DIAGNOSIS — Z66 Do not resuscitate: Secondary | ICD-10-CM | POA: Diagnosis not present

## 2023-08-15 DIAGNOSIS — I252 Old myocardial infarction: Secondary | ICD-10-CM | POA: Diagnosis not present

## 2023-08-15 DIAGNOSIS — R262 Difficulty in walking, not elsewhere classified: Secondary | ICD-10-CM | POA: Diagnosis present

## 2023-08-15 LAB — BASIC METABOLIC PANEL
Anion gap: 9 (ref 5–15)
Anion gap: 11 (ref 5–15)
BUN: 10 mg/dL (ref 8–23)
BUN: 7 mg/dL — ABNORMAL LOW (ref 8–23)
CO2: 27 mmol/L (ref 22–32)
CO2: 25 mmol/L (ref 22–32)
Calcium: 9.1 mg/dL (ref 8.9–10.3)
Calcium: 8.8 mg/dL — ABNORMAL LOW (ref 8.9–10.3)
Chloride: 103 mmol/L (ref 98–111)
Chloride: 102 mmol/L (ref 98–111)
Creatinine, Ser: 0.92 mg/dL (ref 0.44–1.00)
Creatinine, Ser: 0.8 mg/dL (ref 0.44–1.00)
GFR, Estimated: >60 mL/min (ref >60)
GFR, Estimated: >60 mL/min (ref >60)
Glucose, Bld: 100 mg/dL — ABNORMAL HIGH (ref 70–99)
Glucose, Bld: 102 mg/dL — ABNORMAL HIGH (ref 70–99)
Potassium: 3 mmol/L — ABNORMAL LOW (ref 3.5–5.1)
Potassium: 3.7 mmol/L (ref 3.5–5.1)
Sodium: 139 mmol/L (ref 135–145)
Sodium: 138 mmol/L (ref 135–145)

## 2023-08-15 LAB — URINALYSIS, ROUTINE W REFLEX MICROSCOPIC
Bacteria, UA: NONE SEEN
Bilirubin Urine: NEGATIVE
Glucose, UA: NEGATIVE mg/dL
Hgb urine dipstick: NEGATIVE
Ketones, ur: NEGATIVE mg/dL
Leukocytes,Ua: NEGATIVE
Nitrite: NEGATIVE
Protein, ur: NEGATIVE mg/dL
Specific Gravity, Urine: 1.014 (ref 1.005–1.030)
pH: 5 (ref 5.0–8.0)

## 2023-08-15 LAB — CBC WITH DIFFERENTIAL/PLATELET
Abs Immature Granulocytes: 0.01 10*3/uL (ref 0.00–0.07)
Basophils Absolute: 0.1 10*3/uL (ref 0.0–0.1)
Basophils Relative: 1 %
Eosinophils Absolute: 0.1 10*3/uL (ref 0.0–0.5)
Eosinophils Relative: 1 %
HCT: 41.8 % (ref 36.0–46.0)
Hemoglobin: 13.2 g/dL (ref 12.0–15.0)
Immature Granulocytes: 0 %
Lymphocytes Relative: 25 %
Lymphs Abs: 1.3 10*3/uL (ref 0.7–4.0)
MCH: 28.2 pg (ref 26.0–34.0)
MCHC: 31.6 g/dL (ref 30.0–36.0)
MCV: 89.3 fL (ref 80.0–100.0)
Monocytes Absolute: 0.5 10*3/uL (ref 0.1–1.0)
Monocytes Relative: 9 %
Neutro Abs: 3.3 10*3/uL (ref 1.7–7.7)
Neutrophils Relative %: 64 %
Platelets: 229 10*3/uL (ref 150–400)
RBC: 4.68 MIL/uL (ref 3.87–5.11)
RDW: 15.4 % (ref 11.5–15.5)
WBC: 5.2 10*3/uL (ref 4.0–10.5)
nRBC: 0 % (ref 0.0–0.2)

## 2023-08-15 LAB — MAGNESIUM: Magnesium: 2.2 mg/dL (ref 1.7–2.4)

## 2023-08-15 LAB — TROPONIN I (HIGH SENSITIVITY)
Troponin I (High Sensitivity): 9 ng/L (ref <18)
Troponin I (High Sensitivity): 9 ng/L (ref <18)

## 2023-08-15 LAB — TSH: TSH: 0.794 u[IU]/mL (ref 0.350–4.500)

## 2023-08-15 MED ORDER — HALOPERIDOL LACTATE 5 MG/ML IJ SOLN: 1.0000 mg | Freq: Four times a day (QID) | INTRAMUSCULAR | Status: DC | PRN

## 2023-08-15 MED ORDER — EZETIMIBE 10 MG PO TABS
10.0000 mg | ORAL_TABLET | Freq: Every day | ORAL | Status: DC
  Administered 2023-08-15 – 2023-08-21 (×7): 10 mg via ORAL
  Filled 2023-08-15 (×7): qty 1

## 2023-08-15 MED ORDER — MELATONIN 3 MG PO TABS
3.0000 mg | ORAL_TABLET | Freq: Every day | ORAL | Status: DC
  Administered 2023-08-16 – 2023-08-20 (×5): 3 mg via ORAL
  Filled 2023-08-15 (×5): qty 1

## 2023-08-15 MED ORDER — POTASSIUM CHLORIDE 2 MEQ/ML IV SOLN
INTRAVENOUS | Status: DC
Start: 2023-08-15 — End: 2023-08-15

## 2023-08-15 MED ORDER — MIRTAZAPINE 7.5 MG PO TABS
7.5000 mg | ORAL_TABLET | Freq: Every day | ORAL | Status: DC
  Administered 2023-08-16 – 2023-08-20 (×5): 7.5 mg via ORAL
  Filled 2023-08-15 (×6): qty 1

## 2023-08-15 MED ORDER — ASPIRIN 81 MG PO TBEC
81.0000 mg | DELAYED_RELEASE_TABLET | Freq: Every day | ORAL | Status: DC
  Administered 2023-08-16 – 2023-08-21 (×6): 81 mg via ORAL
  Filled 2023-08-15 (×6): qty 1

## 2023-08-15 MED ORDER — POTASSIUM CHLORIDE 10 MEQ/100ML IV SOLN
10.0000 meq | INTRAVENOUS | Status: AC
  Administered 2023-08-15 (×4): 10 meq via INTRAVENOUS
  Filled 2023-08-15 (×4): qty 100

## 2023-08-15 MED ORDER — METOPROLOL SUCCINATE ER 25 MG PO TB24
25.0000 mg | ORAL_TABLET | Freq: Every day | ORAL | Status: DC
  Administered 2023-08-15 – 2023-08-17 (×3): 25 mg via ORAL
  Filled 2023-08-15 (×3): qty 1

## 2023-08-15 MED ORDER — POTASSIUM CHLORIDE 2 MEQ/ML IV SOLN
INTRAVENOUS | Status: DC
  Filled 2023-08-15 (×2): qty 1000

## 2023-08-15 MED ORDER — QUETIAPINE FUMARATE 50 MG PO TABS
25.0000 mg | ORAL_TABLET | Freq: Every day | ORAL | Status: DC
  Administered 2023-08-16 – 2023-08-20 (×5): 25 mg via ORAL
  Filled 2023-08-15 (×2): qty 1
  Filled 2023-08-15 (×2): qty 0.5
  Filled 2023-08-15: qty 1
  Filled 2023-08-15: qty 0.5
  Filled 2023-08-15: qty 1
  Filled 2023-08-15 (×2): qty 0.5
  Filled 2023-08-15: qty 1
  Filled 2023-08-15: qty 0.5

## 2023-08-15 MED ORDER — POLYETHYLENE GLYCOL 3350 17 G PO PACK: 17.0000 g | PACK | Freq: Every day | ORAL | Status: DC | PRN

## 2023-08-15 MED ORDER — HYDRALAZINE HCL 10 MG PO TABS
10.0000 mg | ORAL_TABLET | Freq: Four times a day (QID) | ORAL | Status: DC | PRN
  Administered 2023-08-18: 10 mg via ORAL
  Filled 2023-08-15: qty 1

## 2023-08-15 MED ORDER — ENOXAPARIN SODIUM 40 MG/0.4ML IJ SOSY
40.0000 mg | PREFILLED_SYRINGE | Freq: Every day | INTRAMUSCULAR | Status: DC
  Administered 2023-08-16 – 2023-08-21 (×6): 40 mg via SUBCUTANEOUS
  Filled 2023-08-15 (×6): qty 0.4

## 2023-08-15 MED ORDER — DONEPEZIL HCL 5 MG PO TABS
5.0000 mg | ORAL_TABLET | Freq: Every day | ORAL | Status: DC
  Administered 2023-08-15 – 2023-08-20 (×6): 5 mg via ORAL
  Filled 2023-08-15 (×6): qty 1

## 2023-08-15 MED ORDER — METHIMAZOLE 5 MG PO TABS
5.0000 mg | ORAL_TABLET | Freq: Two times a day (BID) | ORAL | Status: DC
  Administered 2023-08-15 – 2023-08-21 (×13): 5 mg via ORAL
  Filled 2023-08-15 (×13): qty 1

## 2023-08-15 MED ORDER — ATORVASTATIN CALCIUM 40 MG PO TABS
80.0000 mg | ORAL_TABLET | Freq: Every day | ORAL | Status: DC
  Administered 2023-08-15 – 2023-08-21 (×7): 80 mg via ORAL
  Filled 2023-08-15 (×8): qty 2

## 2023-08-15 MED ORDER — SODIUM CHLORIDE 0.9 % IV SOLN: Freq: Once | INTRAVENOUS | Status: AC

## 2023-08-15 MED ORDER — CLONAZEPAM 0.5 MG PO TABS
0.5000 mg | ORAL_TABLET | Freq: Every day | ORAL | Status: DC
  Administered 2023-08-16 – 2023-08-20 (×5): 0.5 mg via ORAL
  Filled 2023-08-15 (×5): qty 1

## 2023-08-15 MED ORDER — PROCHLORPERAZINE EDISYLATE 10 MG/2ML IJ SOLN: 5.0000 mg | Freq: Four times a day (QID) | INTRAMUSCULAR | Status: DC | PRN

## 2023-08-15 MED ORDER — ACETAMINOPHEN 325 MG PO TABS
650.0000 mg | ORAL_TABLET | Freq: Four times a day (QID) | ORAL | Status: DC | PRN
  Administered 2023-08-17: 650 mg via ORAL
  Filled 2023-08-15: qty 2

## 2023-08-15 MED ORDER — MAGNESIUM SULFATE 2 GM/50ML IV SOLN
2.0000 g | Freq: Once | INTRAVENOUS | Status: AC
  Administered 2023-08-15: 2 g via INTRAVENOUS

## 2023-08-15 NOTE — Progress Notes (Signed)
[facility] PROGRESS NOTE    Progress Note  Kristine [name]  HKV:[id number] DOB: 1941-03-18 DOA: 08/14/2023 PCP: Renaye Rakers, MD     Brief Narrative:   Kristine [name] is an 82 y.o. female past medical history Lewy body dementia, essential hypertension COPD not on oxygen comes into the ED for altered mental status, anorexia associated with generalized weakness and unable to ambulate.  Labs do show hypokalemia, CT of the head showed generalized cerebral atrophy no acute intracranial abnormalities.Chest x-ray showed no acute findings.  Most of the history is obtained by the husband which is 34 years old and cannot take care of her at home.   Assessment/Plan:   Acute metabolic encephalopathy: Question acute confusional state in the setting of Lewy body dementia recently moved from the hospital to skilled nursing facility. No evidence of infectious process. Fall precaution. Melatonin at night Haldol for agitation. TSH 2.5 Free T1.4. There are no signs of infectious etiology, neurological etiologies. There is just a change in mental state like difficulty walking. Will have PT OT evaluate her.  Lewy body dementia: Resume home meds.  Coronary artery disease: Resume aspirin, statin, Zetia and metoprolol.  Essential hypertension: Blood pressure stable continue Toprol.  Chronic diastolic heart failure: Started on IV fluids Foley due for 12 hours when she is able to take orals will discontinue.  Failure to thrive: Noted.  Hypokalemia: On IV potassium supplementation.  Chronic anxiety: Continue Klonopin.  Ovulatory dysfunction: PT OT to assess.  DVT prophylaxis: lovenox Family Communication:none Status is: Inpatient Remains inpatient appropriate because: Acute metabolic encephalopathy    Code Status:     Code Status Orders  (From admission, onward)           Start     Ordered   08/15/23 0531  Full code  Continuous       Question:  By:  Answer:  Consent:  discussion documented in EHR   08/15/23 0530           Code Status History     Date Active Date Inactive Code Status Order ID Comments User Context   05/15/2021 0225 05/15/2021 1541 Full Code [id number]  Chotiner, Claudean Severance, MD ED   05/31/2015 0232 05/31/2015 1116 Full Code [id number]  Therisa Doyne, MD ED   03/10/2015 0012 03/12/2015 1818 Full Code [id number]  Rolly Salter, MD ED         IV Access:   Peripheral IV   Procedures and diagnostic studies:   CT Head Wo Contrast  Result Date: 08/15/2023 CLINICAL DATA:  Altered mental status. EXAM: CT HEAD WITHOUT CONTRAST TECHNIQUE: Contiguous axial images were obtained from the base of the skull through the vertex without intravenous contrast. RADIATION DOSE REDUCTION: This exam was performed according to the departmental dose-optimization program which includes automated exposure control, adjustment of the mA and/or kV according to patient size and/or use of iterative reconstruction technique. COMPARISON:  July 28, 2023 FINDINGS: Brain: There is mild cerebral atrophy with widening of the extra-axial spaces and ventricular dilatation. There are areas of decreased attenuation within the white matter tracts of the supratentorial brain, consistent with microvascular disease changes. A partially empty sella is seen. Vascular: No hyperdense vessel or unexpected calcification. Skull: Normal. Negative for fracture or focal lesion. Sinuses/Orbits: No acute finding. Other: None. IMPRESSION: 1. Generalized cerebral atrophy with mild, chronic white matter small vessel ischemic changes. 2. No acute intracranial abnormality. Electronically Signed   By: Aram Candela M.D.   On: 08/15/2023 03:44  DG Chest Portable 1 View  Result Date: 08/15/2023 CLINICAL DATA:  Altered mental status. EXAM: PORTABLE CHEST 1 VIEW COMPARISON:  July 28, 2023 FINDINGS: The heart size and mediastinal contours are within normal limits. There is marked severity calcification  of the thoracic aorta. Both lungs are clear. The visualized skeletal structures are unremarkable. IMPRESSION: No active cardiopulmonary disease. Electronically Signed   By: Aram Candela M.D.   On: 08/15/2023 03:38     Medical Consultants:   None.   Subjective:    Kristine [name] no complaints  Objective:    Vitals:   08/15/23 0200 08/15/23 0317 08/15/23 0422 08/15/23 0558  BP: (!) 139/104  (!) 176/90 (!) 158/94  Pulse: 72  78 83  Resp: 13  15 13   Temp:  98.3 F (36.8 C) 98.2 F (36.8 C) 98.2 F (36.8 C)  TempSrc:   Oral Oral  SpO2: 98%  98% 98%  Weight:       SpO2: 98 %   Intake/Output Summary (Last 24 hours) at 08/15/2023 1914 Last data filed at 08/15/2023 0657 Gross per 24 hour  Intake 150 ml  Output --  Net 150 ml   Filed Weights   08/14/23 2226  Weight: 51 kg    Exam: General exam: In no acute distress. Respiratory system: Good air movement and clear to auscultation. Cardiovascular system: S1 & S2 heard, RRR. No JVD. Gastrointestinal system: Abdomen is nondistended, soft and nontender.  Extremities: No pedal edema. Skin: No rashes, lesions or ulcers Psychiatry: Judgement and insight appear normal. Mood & affect appropriate.    Data Reviewed:    Labs: Basic Metabolic Panel: Recent Labs  Lab 08/15/23 0104  NA 139  K 3.0*  CL 103  CO2 27  GLUCOSE 100*  BUN 10  CREATININE 0.92  CALCIUM 9.1   GFR Estimated Creatinine Clearance: 39.3 mL/min (by C-G formula based on SCr of 0.92 mg/dL). Liver Function Tests: No results for input(s): "AST", "ALT", "ALKPHOS", "BILITOT", "PROT", "ALBUMIN" in the last 168 hours. No results for input(s): "LIPASE", "AMYLASE" in the last 168 hours. No results for input(s): "AMMONIA" in the last 168 hours. Coagulation profile No results for input(s): "INR", "PROTIME" in the last 168 hours. COVID-19 Labs  No results for input(s): "DDIMER", "FERRITIN", "LDH", "CRP" in the last 72 hours.  Lab Results  Component  Value Date   SARSCOV2NAA NEGATIVE 07/28/2023   SARSCOV2NAA NEGATIVE 05/14/2021    CBC: Recent Labs  Lab 08/15/23 0104  WBC 5.2  NEUTROABS 3.3  HGB 13.2  HCT 41.8  MCV 89.3  PLT 229   Cardiac Enzymes: No results for input(s): "CKTOTAL", "CKMB", "CKMBINDEX", "TROPONINI" in the last 168 hours. BNP (last 3 results) No results for input(s): "PROBNP" in the last 8760 hours. CBG: No results for input(s): "GLUCAP" in the last 168 hours. D-Dimer: No results for input(s): "DDIMER" in the last 72 hours. Hgb A1c: No results for input(s): "HGBA1C" in the last 72 hours. Lipid Profile: No results for input(s): "CHOL", "HDL", "LDLCALC", "TRIG", "CHOLHDL", "LDLDIRECT" in the last 72 hours. Thyroid function studies: No results for input(s): "TSH", "T4TOTAL", "T3FREE", "THYROIDAB" in the last 72 hours.  Invalid input(s): "FREET3" Anemia work up: No results for input(s): "VITAMINB12", "FOLATE", "FERRITIN", "TIBC", "IRON", "RETICCTPCT" in the last 72 hours. Sepsis Labs: Recent Labs  Lab 08/15/23 0104  WBC 5.2   Microbiology No results found for this or any previous visit (from the past 240 hour(s)).   Medications:    aspirin EC  81 mg Oral Daily   atorvastatin  80 mg Oral Daily   clonazePAM  0.5 mg Oral QHS   donepezil  10 mg Oral See admin instructions   enoxaparin (LOVENOX) injection  40 mg Subcutaneous Daily   ezetimibe  10 mg Oral Daily   methimazole  5 mg Oral BID   metoprolol succinate  25 mg Oral See admin instructions   mirtazapine  7.5 mg Oral QHS   QUEtiapine  25 mg Oral QHS   Continuous Infusions:  lactated ringers 1,000 mL with potassium chloride 40 mEq infusion     potassium chloride 10 mEq (08/15/23 0554)      LOS: 0 days   Marinda Elk  [facility]  08/15/2023, 7:02 AM

## 2023-08-15 NOTE — H&P (Addendum)
History and Physical  Kristine [name]:[id number] DOB: 01/03/1941 DOA: 08/14/2023  Referring physician: Dr. Nicanor Alcon, EDP  PCP: Renaye Rakers, MD  Outpatient Specialists: Neurology Patient coming from: Home  Chief Complaint: Altered mental status.  HPI: Kristine [name] is a 82 y.o. female with medical history significant for Lewy body dementia, hypertension, hyperlipidemia, coronary artery disease, COPD not on oxygen supplementation, who presents to the ED from home due to altered mental status, sleeping more, not eating.  Associated with generalized weakness, unable to ambulate independently which is not her baseline.  The patient was recently discharged from SNF and returned home where she lives with her husband who cares for her.  No reported fevers or chills.  In the ED, vital signs are stable.  UA is negative for pyuria.  Lab studies essentially unremarkable except for hypokalemia with serum potassium of 3.0.  The patient received electrolytes replacement and IV fluid in the ED.  Admitted by [facility], hospitalist service.  ED Course: Tmax 98.2.  BP 158/94, pulse 81, respiratory rate 13, saturation 98% on room air.  Review of Systems: Review of systems as noted in the HPI. All other systems reviewed and are negative.   Past Medical History:  Diagnosis Date   COPD (chronic obstructive pulmonary disease) (HCC)    Hard of hearing    History of anemia    History of blood transfusion    History of pneumonia    HTN (hypertension)    Hyperlipidemia    Hyperthyroidism    treated wtih radioactive iodine   OA (osteoarthritis of spine)    PVD (peripheral vascular disease) (HCC)    Lower extremity Dopplers  May 2revealed an ABI of 0.83 in the right posterior    Shortness of breath dyspnea    STEMI (ST elevation myocardial infarction) [facility]) May 2011   Promus DES in the proximal, mid and distal RCA in May 2011   Stroke [facility])    Urinary frequency    Urinary urgency    Past Surgical History:   Procedure Laterality Date   ABDOMINAL HYSTERECTOMY     Had ovarian resection and required lysis of adhesions   CARDIAC CATHETERIZATION  04/07/2010   EF 60-70%   CORONARY STENTS     x3   ESOPHAGOGASTRODUODENOSCOPY N/A 03/10/2015   Procedure: ESOPHAGOGASTRODUODENOSCOPY (EGD);  Surgeon: Wandalee Ferdinand, MD;  Location: [facility] ENDOSCOPY;  Service: Endoscopy;  Laterality: N/A;   OVARY SURGERY     TOTAL HIP ARTHROPLASTY  2004   left   TOTAL HIP ARTHROPLASTY Right 01/23/2016   Procedure: RIGHT TOTAL HIP ARTHROPLASTY ANTERIOR APPROACH;  Surgeon: Loreta Ave, MD;  Location: [facility] OR;  Service: Orthopedics;  Laterality: Right;    Social History:  reports that she quit smoking about 13 years ago. Her smoking use included cigarettes. She started smoking about 58 years ago. She has a 45 pack-year smoking history. She has never used smokeless tobacco. She reports current alcohol use. She reports that she does not use drugs.   Allergies  Allergen Reactions   Ace Inhibitors Swelling    Angioedema of the tongue   Iodinated Contrast Media Anaphylaxis   Other Anaphylaxis    Allergic to melons, nuts   Shellfish-Derived Products Anaphylaxis   Hydrocodone Other (See Comments)    unspecified    Family History  Problem Relation Age of Onset   Peripheral vascular disease Mother    Congestive Heart Failure Mother    Heart attack Father  Prior to Admission medications   Medication Sig Start Date End Date Taking? Authorizing Provider  methimazole (TAPAZOLE) 5 MG tablet Take 5 mg by mouth 2 (two) times daily. 08/04/23  Yes [provider]  aspirin EC 81 MG tablet Take 81 mg by mouth daily.    [provider]  atorvastatin (LIPITOR) 80 MG tablet Take 1 tablet (80 mg total) by mouth daily. 04/08/23   Kathleene Hazel, MD  BREZTRI AEROSPHERE 160-9-4.8 MCG/ACT AERO Inhale 2 puffs into the lungs 2 (two) times daily. 10/17/20   [provider]  clonazePAM (KLONOPIN) 1 MG tablet 1.5  pills po qHS Patient taking differently: Take 1.5 mg by mouth at bedtime. 1.5 pills po qHS 02/26/23   Sater, Pearletha Furl, MD  donepezil (ARICEPT) 10 MG tablet TAKE 1 TABLET(5 MG) BY MOUTH AT BEDTIME Patient taking differently: Take 10 mg by mouth See admin instructions. TAKE 1 TABLET(5 MG) BY MOUTH AT BEDTIME 02/11/23   Sater, Pearletha Furl, MD  ezetimibe (ZETIA) 10 MG tablet Take 1 tablet (10 mg total) by mouth daily. 04/08/23   Kathleene Hazel, MD  hydrALAZINE (APRESOLINE) 25 MG tablet Take 1/2 a tablet by mouth once a day Patient taking differently: Take 12.5 mg by mouth daily. 03/11/23   Dyann Kief, PA-C  hydrochlorothiazide (HYDRODIURIL) 25 MG tablet TAKE 1 TABLET(25 MG) BY MOUTH DAILY Patient taking differently: Take 25 mg by mouth daily. 02/02/23   Kathleene Hazel, MD  hydrOXYzine (ATARAX) 10 MG tablet Take 1 or 2 tablets by mouth twice a day as needed for agitation 01/22/23   Sater, Pearletha Furl, MD  levothyroxine (SYNTHROID) 50 MCG tablet Take 50 mcg by mouth every other day. 05/20/22   [provider]  metoprolol succinate (TOPROL-XL) 50 MG 24 hr tablet TAKE 1 TABLET(50 MG) BY MOUTH DAILY Patient taking differently: Take 25 mg by mouth See admin instructions. 02/02/23   Kathleene Hazel, MD  mirtazapine (REMERON) 15 MG tablet TAKE 1/2 TO 1 TABLET BY MOUTH EVERY NIGHT AT BEDTIME Patient taking differently: Take 7.5 mg by mouth at bedtime. 03/11/23   Sater, Pearletha Furl, MD  nitroGLYCERIN (NITROSTAT) 0.3 MG SL tablet Place 1 tablet (0.3 mg total) under the tongue every 5 (five) minutes as needed for chest pain. Max 3 doses 5 minutes apart per episode of chest pain. Call 911 if need 3rd dose. 04/08/23   Kathleene Hazel, MD  QUEtiapine (SEROQUEL) 25 MG tablet Take 1 tablet (25 mg total) by mouth at bedtime. 02/19/23   Sater, Pearletha Furl, MD  risperiDONE (RISPERDAL) 0.25 MG tablet Take 1 tablet (0.25 mg total) by mouth 2 (two) times daily as needed. Secondary to Lewy Body  Dementia 12/17/22   Genice Rouge, MD    Physical Exam: BP (!) 176/90   Pulse 78   Temp 98.2 F (36.8 C) (Oral)   Resp 15   Wt 51 kg   SpO2 98%   BMI 19.30 kg/m   General: 82 y.o. year-old female well developed well nourished in no acute distress.  Alert and confused in setting of Lewy body dementia. Cardiovascular: Regular rate and rhythm with no rubs or gallops.  No thyromegaly or JVD noted.  No lower extremity edema. 2/4 pulses in all 4 extremities. Respiratory: Clear to auscultation with no wheezes or rales. Good inspiratory effort. Abdomen: Soft nontender nondistended with normal bowel sounds x4 quadrants. Muskuloskeletal: No cyanosis, clubbing or edema noted bilaterally Neuro: CN II-XII intact, strength, sensation, reflexes Skin: No  ulcerative lesions noted or rashes Psychiatry: Judgement and insight appear altered. Mood is appropriate for condition and setting          Labs on Admission:  Basic Metabolic Panel: Recent Labs  Lab 08/15/23 0104  NA 139  K 3.0*  CL 103  CO2 27  GLUCOSE 100*  BUN 10  CREATININE 0.92  CALCIUM 9.1   Liver Function Tests: No results for input(s): "AST", "ALT", "ALKPHOS", "BILITOT", "PROT", "ALBUMIN" in the last 168 hours. No results for input(s): "LIPASE", "AMYLASE" in the last 168 hours. No results for input(s): "AMMONIA" in the last 168 hours. CBC: Recent Labs  Lab 08/15/23 0104  WBC 5.2  NEUTROABS 3.3  HGB 13.2  HCT 41.8  MCV 89.3  PLT 229   Cardiac Enzymes: No results for input(s): "CKTOTAL", "CKMB", "CKMBINDEX", "TROPONINI" in the last 168 hours.  BNP (last 3 results) Recent Labs    10/11/22 2303  BNP 133.1*    ProBNP (last 3 results) No results for input(s): "PROBNP" in the last 8760 hours.  CBG: No results for input(s): "GLUCAP" in the last 168 hours.  Radiological Exams on Admission: CT Head Wo Contrast  Result Date: 08/15/2023 CLINICAL DATA:  Altered mental status. EXAM: CT HEAD WITHOUT CONTRAST  TECHNIQUE: Contiguous axial images were obtained from the base of the skull through the vertex without intravenous contrast. RADIATION DOSE REDUCTION: This exam was performed according to the departmental dose-optimization program which includes automated exposure control, adjustment of the mA and/or kV according to patient size and/or use of iterative reconstruction technique. COMPARISON:  July 28, 2023 FINDINGS: Brain: There is mild cerebral atrophy with widening of the extra-axial spaces and ventricular dilatation. There are areas of decreased attenuation within the white matter tracts of the supratentorial brain, consistent with microvascular disease changes. A partially empty sella is seen. Vascular: No hyperdense vessel or unexpected calcification. Skull: Normal. Negative for fracture or focal lesion. Sinuses/Orbits: No acute finding. Other: None. IMPRESSION: 1. Generalized cerebral atrophy with mild, chronic white matter small vessel ischemic changes. 2. No acute intracranial abnormality. Electronically Signed   By: Aram Candela M.D.   On: 08/15/2023 03:44   DG Chest Portable 1 View  Result Date: 08/15/2023 CLINICAL DATA:  Altered mental status. EXAM: PORTABLE CHEST 1 VIEW COMPARISON:  July 28, 2023 FINDINGS: The heart size and mediastinal contours are within normal limits. There is marked severity calcification of the thoracic aorta. Both lungs are clear. The visualized skeletal structures are unremarkable. IMPRESSION: No active cardiopulmonary disease. Electronically Signed   By: Aram Candela M.D.   On: 08/15/2023 03:38    EKG: I independently viewed the EKG done and my findings are as followed: Sinus rhythm rate of 73.  Nonspecific ST-T changes.  QTc 366.  Assessment/Plan Present on Admission:  AMS (altered mental status)  Principal Problem:   AMS (altered mental status)  Acute metabolic encephalopathy likely in the setting of progressive Lewy body dementia The patient has a  history of hyperthyroidism we will obtain TSH and free T4, to rule out hyperthyroidism as a cause of her altered mental status. Noncontrast head CT negative for acute intracranial findings. No evidence of active infective process, UA negative for pyuria, chest x-ray nonacute. One-to-one sitter Fall precautions Reorient as needed  Lewy body dementia Resume home regimen Needs one-to-one sitter Needs fall precautions  Coronary artery disease Resume home aspirin, statin, Zetia, Toprol-XL No evidence of acute ischemia on twelve-lead EKG. High-sensitivity troponin negative x 2 Monitor on telemetry  Hypertension, BP is not at goal, elevated Resume home oral antihypertensives, Toprol-XL. Closely monitor vital signs.  Chronic diastolic CHF Euvolemic on exam Gentle IV fluid hydration LR KCl at 50 cc/h x 1 day. Monitor volume status while on IV fluid Strict I's and O's and daily weight  Failure to thrive in an adult Liberalize diet Encourage oral protein calorie intake  Hypokalemia Serum potassium 3.0 Repleted intravenously The patient tends to pocket her food and pills  Chronic anxiety Resume home Klonopin  Ambulatory dysfunction PT OT assessment Fall precautions  Goals of care Palliative care medicine consulted to assist with establishing goals of care   Time: 75 minutes.   DVT prophylaxis: Subcu Lovenox daily  Code Status: Full code  Family Communication: Updated the patient's husband at bedside.  Disposition Plan: Admitted to telemetry unit  Consults called: Palliative care medicine.  Admission status: Inpatient.   Status is: Inpatient The patient requires at least 2 midnights for further evaluation and treatment of present condition.   Darlin Drop MD [facility] Pager [phone]  If 7PM-7AM, please contact night-coverage [email or web address] Password [email or web address]  08/15/2023, 5:29 AM

## 2023-08-15 NOTE — ED Provider Notes (Signed)
EMERGENCY DEPARTMENT AT [facility] Provider Note   CSN: [id number] Arrival date & time: 08/14/23  2213     History  Chief Complaint  Patient presents with   Failure To Thrive    Kristine [name] is a 82 y.o. female.  The history is provided by the spouse and medical records.  Altered Mental Status Presenting symptoms: confusion   Presenting symptoms comment:  Gait abnormalities and generalized tremors  Severity:  Moderate Timing:  Constant Progression:  Unchanged Chronicity:  New Context: dementia   Associated symptoms: weakness   Associated symptoms: no abdominal pain and no fever   Patient with known [place] body dementia recently discharged from the hospital with thyroid medication change presents with not taking POs (putting food in her mouth and spitting it out) and not ambulating, worsening tremor and worsening confusion.     Past Medical History:  Diagnosis Date   COPD (chronic obstructive pulmonary disease) (HCC)    Hard of hearing    History of anemia    History of blood transfusion    History of pneumonia    HTN (hypertension)    Hyperlipidemia    Hyperthyroidism    treated wtih radioactive iodine   OA (osteoarthritis of spine)    PVD (peripheral vascular disease) (HCC)    Lower extremity Dopplers  May 2revealed an ABI of 0.83 in the right posterior    Shortness of breath dyspnea    STEMI (ST elevation myocardial infarction) [facility]) May 2011   Promus DES in the proximal, mid and distal RCA in May 2011   Stroke [facility])    Urinary frequency    Urinary urgency      Home Medications Prior to Admission medications   Medication Sig Start Date End Date Taking? Authorizing Provider  methimazole (TAPAZOLE) 5 MG tablet Take 5 mg by mouth 2 (two) times daily. 08/04/23  Yes [provider]  aspirin EC 81 MG tablet Take 81 mg by mouth daily.    [provider]  atorvastatin (LIPITOR) 80 MG tablet Take 1 tablet (80 mg total) by  mouth daily. 04/08/23   Kathleene Hazel, MD  BREZTRI AEROSPHERE 160-9-4.8 MCG/ACT AERO Inhale 2 puffs into the lungs 2 (two) times daily. 10/17/20   [provider]  clonazePAM (KLONOPIN) 1 MG tablet 1.5 pills po qHS Patient taking differently: Take 1.5 mg by mouth at bedtime. 1.5 pills po qHS 02/26/23   Sater, Pearletha Furl, MD  donepezil (ARICEPT) 10 MG tablet TAKE 1 TABLET(5 MG) BY MOUTH AT BEDTIME Patient taking differently: Take 10 mg by mouth See admin instructions. TAKE 1 TABLET(5 MG) BY MOUTH AT BEDTIME 02/11/23   Sater, Pearletha Furl, MD  ezetimibe (ZETIA) 10 MG tablet Take 1 tablet (10 mg total) by mouth daily. 04/08/23   Kathleene Hazel, MD  hydrALAZINE (APRESOLINE) 25 MG tablet Take 1/2 a tablet by mouth once a day Patient taking differently: Take 12.5 mg by mouth daily. 03/11/23   Dyann Kief, PA-C  hydrochlorothiazide (HYDRODIURIL) 25 MG tablet TAKE 1 TABLET(25 MG) BY MOUTH DAILY Patient taking differently: Take 25 mg by mouth daily. 02/02/23   Kathleene Hazel, MD  hydrOXYzine (ATARAX) 10 MG tablet Take 1 or 2 tablets by mouth twice a day as needed for agitation 01/22/23   Sater, Pearletha Furl, MD  levothyroxine (SYNTHROID) 50 MCG tablet Take 50 mcg by mouth every other day. 05/20/22   [provider]  metoprolol succinate (TOPROL-XL) 50 MG 24  hr tablet TAKE 1 TABLET(50 MG) BY MOUTH DAILY Patient taking differently: Take 25 mg by mouth See admin instructions. 02/02/23   Kathleene Hazel, MD  mirtazapine (REMERON) 15 MG tablet TAKE 1/2 TO 1 TABLET BY MOUTH EVERY NIGHT AT BEDTIME Patient taking differently: Take 7.5 mg by mouth at bedtime. 03/11/23   Sater, Pearletha Furl, MD  nitroGLYCERIN (NITROSTAT) 0.3 MG SL tablet Place 1 tablet (0.3 mg total) under the tongue every 5 (five) minutes as needed for chest pain. Max 3 doses 5 minutes apart per episode of chest pain. Call 911 if need 3rd dose. 04/08/23   Kathleene Hazel, MD  QUEtiapine (SEROQUEL) 25 MG tablet  Take 1 tablet (25 mg total) by mouth at bedtime. 02/19/23   Sater, Pearletha Furl, MD  risperiDONE (RISPERDAL) 0.25 MG tablet Take 1 tablet (0.25 mg total) by mouth 2 (two) times daily as needed. Secondary to Lewy Body Dementia 12/17/22   Lovorn, Aundra Millet, MD      Allergies    Ace inhibitors, Iodinated contrast media, Other, Shellfish-derived products, and Hydrocodone    Review of Systems   Review of Systems  Unable to perform ROS: Dementia  Constitutional:  Negative for fever.  HENT:  Negative for facial swelling.   Gastrointestinal:  Negative for abdominal pain.  Neurological:  Positive for weakness.  Psychiatric/Behavioral:  Positive for confusion.     Physical Exam Updated Vital Signs BP (!) 176/90   Pulse 78   Temp 98.2 F (36.8 C) (Oral)   Resp 15   Wt 51 kg   SpO2 98%   BMI 19.30 kg/m  Physical Exam Vitals and nursing note reviewed.  Constitutional:      General: She is not in acute distress.    Appearance: She is well-developed.  HENT:     Head: Normocephalic and atraumatic.     Nose: Nose normal.  Eyes:     Pupils: Pupils are equal, round, and reactive to light.  Cardiovascular:     Rate and Rhythm: Normal rate and regular rhythm.     Pulses: Normal pulses.     Heart sounds: Normal heart sounds.  Pulmonary:     Effort: Pulmonary effort is normal. No respiratory distress.     Breath sounds: Normal breath sounds.  Abdominal:     General: Bowel sounds are normal. There is no distension.     Palpations: Abdomen is soft.     Tenderness: There is no abdominal tenderness. There is no guarding or rebound.  Genitourinary:    Vagina: No vaginal discharge.  Musculoskeletal:        General: Normal range of motion.     Cervical back: Normal range of motion and neck supple.  Skin:    General: Skin is warm and dry.     Capillary Refill: Capillary refill takes less than 2 seconds.     Findings: No erythema or rash.  Neurological:     Deep Tendon Reflexes: Reflexes normal.   Psychiatric:        Mood and Affect: Mood normal.     ED Results / Procedures / Treatments   Labs (all labs ordered are listed, but only abnormal results are displayed) Results for orders placed or performed during the hospital encounter of 08/14/23  CBC with Differential  Result Value Ref Range   WBC 5.2 4.0 - 10.5 K/uL   RBC 4.68 3.87 - 5.11 MIL/uL   Hemoglobin 13.2 12.0 - 15.0 g/dL   HCT 16.1 09.6 - 04.5 %  MCV 89.3 80.0 - 100.0 fL   MCH 28.2 26.0 - 34.0 pg   MCHC 31.6 30.0 - 36.0 g/dL   RDW 84.1 32.4 - 40.1 %   Platelets 229 150 - 400 K/uL   nRBC 0.0 0.0 - 0.2 %   Neutrophils Relative % 64 %   Neutro Abs 3.3 1.7 - 7.7 K/uL   Lymphocytes Relative 25 %   Lymphs Abs 1.3 0.7 - 4.0 K/uL   Monocytes Relative 9 %   Monocytes Absolute 0.5 0.1 - 1.0 K/uL   Eosinophils Relative 1 %   Eosinophils Absolute 0.1 0.0 - 0.5 K/uL   Basophils Relative 1 %   Basophils Absolute 0.1 0.0 - 0.1 K/uL   Immature Granulocytes 0 %   Abs Immature Granulocytes 0.01 0.00 - 0.07 K/uL  Basic metabolic panel  Result Value Ref Range   Sodium 139 135 - 145 mmol/L   Potassium 3.0 (L) 3.5 - 5.1 mmol/L   Chloride 103 98 - 111 mmol/L   CO2 27 22 - 32 mmol/L   Glucose, Bld 100 (H) 70 - 99 mg/dL   BUN 10 8 - 23 mg/dL   Creatinine, Ser 0.27 0.44 - 1.00 mg/dL   Calcium 9.1 8.9 - 25.3 mg/dL   GFR, Estimated >66 >44 mL/min   Anion gap 9 5 - 15  Urinalysis, Routine w reflex microscopic -Urine, Clean Catch  Result Value Ref Range   Color, Urine YELLOW (A) YELLOW   APPearance CLEAR (A) CLEAR   Specific Gravity, Urine 1.014 1.005 - 1.030   pH 5.0 5.0 - 8.0   Glucose, UA NEGATIVE NEGATIVE mg/dL   Hgb urine dipstick NEGATIVE NEGATIVE   Bilirubin Urine NEGATIVE NEGATIVE   Ketones, ur NEGATIVE NEGATIVE mg/dL   Protein, ur NEGATIVE NEGATIVE mg/dL   Nitrite NEGATIVE NEGATIVE   Leukocytes,Ua NEGATIVE NEGATIVE   RBC / HPF 0-5 0 - 5 RBC/hpf   WBC, UA 0-5 0 - 5 WBC/hpf   Bacteria, UA NONE SEEN NONE SEEN    Squamous Epithelial / HPF 0-5 0 - 5 /HPF   Mucus PRESENT   Troponin I (High Sensitivity)  Result Value Ref Range   Troponin I (High Sensitivity) 9 <18 ng/L  Troponin I (High Sensitivity)  Result Value Ref Range   Troponin I (High Sensitivity) 9 <18 ng/L   CT Head Wo Contrast  Result Date: 08/15/2023 CLINICAL DATA:  Altered mental status. EXAM: CT HEAD WITHOUT CONTRAST TECHNIQUE: Contiguous axial images were obtained from the base of the skull through the vertex without intravenous contrast. RADIATION DOSE REDUCTION: This exam was performed according to the departmental dose-optimization program which includes automated exposure control, adjustment of the mA and/or kV according to patient size and/or use of iterative reconstruction technique. COMPARISON:  July 28, 2023 FINDINGS: Brain: There is mild cerebral atrophy with widening of the extra-axial spaces and ventricular dilatation. There are areas of decreased attenuation within the white matter tracts of the supratentorial brain, consistent with microvascular disease changes. A partially empty sella is seen. Vascular: No hyperdense vessel or unexpected calcification. Skull: Normal. Negative for fracture or focal lesion. Sinuses/Orbits: No acute finding. Other: None. IMPRESSION: 1. Generalized cerebral atrophy with mild, chronic white matter small vessel ischemic changes. 2. No acute intracranial abnormality. Electronically Signed   By: Aram Candela M.D.   On: 08/15/2023 03:44   DG Chest Portable 1 View  Result Date: 08/15/2023 CLINICAL DATA:  Altered mental status. EXAM: PORTABLE CHEST 1 VIEW COMPARISON:  July 28, 2023  FINDINGS: The heart size and mediastinal contours are within normal limits. There is marked severity calcification of the thoracic aorta. Both lungs are clear. The visualized skeletal structures are unremarkable. IMPRESSION: No active cardiopulmonary disease. Electronically Signed   By: Aram Candela M.D.   On: 08/15/2023  03:38   MR BRAIN WO CONTRAST  Result Date: 07/28/2023 CLINICAL DATA:  Altered mental status EXAM: MRI HEAD WITHOUT CONTRAST TECHNIQUE: Multiplanar, multiecho pulse sequences of the brain and surrounding structures were obtained without intravenous contrast. COMPARISON:  06/07/2023 FINDINGS: Brain: No restricted diffusion to suggest acute or subacute infarct. No acute hemorrhage, mass, mass effect, or midline shift. No hydrocephalus or extra-axial collection. Redemonstrated empty sella with expanded sella turcica, unchanged. Normal craniocervical junction. Punctate foci of hemosiderin deposition right temporal lobe and right basal ganglia, likely sequela of prior hypertensive microhemorrhage. Remote lacunar infarct in the left basal ganglia. Scattered T2 hyperintense signal in the periventricular white matter, likely the sequela of mild chronic small vessel ischemic disease. Vascular: Normal arterial flow voids. Skull and upper cervical spine: Normal marrow signal. Sinuses/Orbits: Clear paranasal sinuses. No acute finding in the orbits. Other: The mastoid air cells are well aerated. IMPRESSION: No acute intracranial process. No evidence of acute or subacute infarct. Electronically Signed   By: Wiliam Ke M.D.   On: 07/28/2023 20:47   CT Head Wo Contrast  Result Date: 07/28/2023 CLINICAL DATA:  Mental status change, unknown cause EXAM: CT HEAD WITHOUT CONTRAST TECHNIQUE: Contiguous axial images were obtained from the base of the skull through the vertex without intravenous contrast. RADIATION DOSE REDUCTION: This exam was performed according to the departmental dose-optimization program which includes automated exposure control, adjustment of the mA and/or kV according to patient size and/or use of iterative reconstruction technique. COMPARISON:  CT head June 07, 2023. FINDINGS: Brain: No evidence of acute infarction, hemorrhage, hydrocephalus, extra-axial collection or mass lesion/mass effect. Partially  empty sella. Vascular: No hyperdense vessel identified. Skull: No acute fracture. Sinuses/Orbits: Clear sinuses.  No acute orbital findings. Other: No mastoid effusions. IMPRESSION: No evidence of acute intracranial abnormality. Electronically Signed   By: Feliberto Harts M.D.   On: 07/28/2023 18:20   DG Chest Portable 1 View  Result Date: 07/28/2023 CLINICAL DATA:  weakness EXAM: PORTABLE CHEST 1 VIEW COMPARISON:  CXR 06/07/23 FINDINGS: No pleural effusion. No pneumothorax. Normal cardiac and mediastinal contours. No focal airspace opacity. No radiographically apparent displaced rib fractures. Visualized upper abdomen unremarkable. IMPRESSION: No focal airspace opacity Electronically Signed   By: Lorenza Cambridge M.D.   On: 07/28/2023 16:48  ]   EKG EKG Interpretation Date/Time:  Saturday August 15 2023 02:17:37 EDT Ventricular Rate:  73 PR Interval:  156 QRS Duration:  92 QT Interval:  332 QTC Calculation: 366 R Axis:   10  Text Interpretation: Sinus rhythm Confirmed by Louise Rawson ([id number]) on 08/15/2023 2:55:02 AM  Radiology CT Head Wo Contrast  Result Date: 08/15/2023 CLINICAL DATA:  Altered mental status. EXAM: CT HEAD WITHOUT CONTRAST TECHNIQUE: Contiguous axial images were obtained from the base of the skull through the vertex without intravenous contrast. RADIATION DOSE REDUCTION: This exam was performed according to the departmental dose-optimization program which includes automated exposure control, adjustment of the mA and/or kV according to patient size and/or use of iterative reconstruction technique. COMPARISON:  July 28, 2023 FINDINGS: Brain: There is mild cerebral atrophy with widening of the extra-axial spaces and ventricular dilatation. There are areas of decreased attenuation within the white matter tracts of the supratentorial  brain, consistent with microvascular disease changes. A partially empty sella is seen. Vascular: No hyperdense vessel or unexpected calcification.  Skull: Normal. Negative for fracture or focal lesion. Sinuses/Orbits: No acute finding. Other: None. IMPRESSION: 1. Generalized cerebral atrophy with mild, chronic white matter small vessel ischemic changes. 2. No acute intracranial abnormality. Electronically Signed   By: Aram Candela M.D.   On: 08/15/2023 03:44   DG Chest Portable 1 View  Result Date: 08/15/2023 CLINICAL DATA:  Altered mental status. EXAM: PORTABLE CHEST 1 VIEW COMPARISON:  July 28, 2023 FINDINGS: The heart size and mediastinal contours are within normal limits. There is marked severity calcification of the thoracic aorta. Both lungs are clear. The visualized skeletal structures are unremarkable. IMPRESSION: No active cardiopulmonary disease. Electronically Signed   By: Aram Candela M.D.   On: 08/15/2023 03:38    Procedures Procedures    Medications Ordered in ED Medications  potassium chloride 10 mEq in 100 mL IVPB (10 mEq Intravenous New Bag/Given 08/15/23 0443)    ED Course/ Medical Decision Making/ A&P                                 Medical Decision Making Amount and/or Complexity of Data Reviewed Independent Historian: spouse    Details: See above  External Data Reviewed: notes.    Details: Previous notes reviewed  Labs: ordered.    Details: Normal white count 5.2, normal hemoglobin 13.2, normal platelets. Normal sodium 139, low potassium 3, normal creatinine. Urine is negative for UTI Radiology: ordered and independent interpretation performed.    Details: CT in head by me no bleed ECG/medicine tests: ordered and independent interpretation performed. Discussion of management or test interpretation with external provider(s): Case d/w Dr. hall  Risk Prescription drug management. Decision regarding hospitalization.     Final Clinical Impression(s) / ED Diagnoses Final diagnoses:  Altered mental status, unspecified altered mental status type   The patient appears reasonably stabilized for  admission considering the current resources, flow, and capabilities available in the ED at this time, and I doubt any other [place] requiring further screening and/or treatment in the ED prior to admission.  Rx / DC Orders ED Discharge Orders     None         Erie Sica, MD 08/15/23 [phone]

## 2023-08-15 NOTE — ED Notes (Signed)
ED TO INPATIENT HANDOFF REPORT  ED Nurse Name and Phone #: Richarda Osmond Name/Age/Gender Kristine [name] 82 y.o. female Room/Bed: WA03/WA03  Code Status   Code Status: Full Code  Home/SNF/Other Skilled nursing facility Patient oriented to: self Is this baseline? Yes   Triage Complete: Triage complete  Chief Complaint AMS (altered mental status) [R41.82]  Triage Note Patient [name] from home c/o FTT.  Family reports that patient recently released from [facility] and has been sleeping a lot more than normal at home and hard to engage.  [name] reports problems with her thyroid but is unsure what.   Allergies Allergies  Allergen Reactions   Ace Inhibitors Swelling    Angioedema of the tongue   Iodinated Contrast Media Anaphylaxis   Other Anaphylaxis    Allergic to melons, nuts   Shellfish-Derived Products Anaphylaxis   Hydrocodone Other (See Comments)    unspecified    Level of Care/Admitting Diagnosis ED Disposition     ED Disposition  Admit   Condition  --   Comment  Hospital Area: [facility] [[id number]]  Level of Care: Telemetry [5]  Admit to tele based on following criteria: Monitor for Ischemic changes  May admit patient to [facility] or [facility] if equivalent level of care is available:: Yes  Covid Evaluation: Asymptomatic - no recent exposure (last 10 days) testing not required  Diagnosis: AMS (altered mental status) [[id number]]  Admitting Physician: Darlin Drop [[id number]]  Attending Physician: Darlin Drop [[id number]]  Certification:: I certify this patient will need inpatient services for at least 2 midnights  Expected Medical Readiness: 08/16/2023          B Medical/Surgery History Past Medical History:  Diagnosis Date   COPD (chronic obstructive pulmonary disease) (HCC)    Hard of hearing    History of anemia    History of blood transfusion    History of pneumonia    HTN (hypertension)    Hyperlipidemia     Hyperthyroidism    treated wtih radioactive iodine   OA (osteoarthritis of spine)    PVD (peripheral vascular disease) (HCC)    Lower extremity Dopplers  May 2revealed an ABI of 0.83 in the right posterior    Shortness of breath dyspnea    STEMI (ST elevation myocardial infarction) [facility]) May 2011   Promus DES in the proximal, mid and distal RCA in May 2011   Stroke [facility])    Urinary frequency    Urinary urgency    Past Surgical History:  Procedure Laterality Date   ABDOMINAL HYSTERECTOMY     Had ovarian resection and required lysis of adhesions   CARDIAC CATHETERIZATION  04/07/2010   EF 60-70%   CORONARY STENTS     x3   ESOPHAGOGASTRODUODENOSCOPY N/A 03/10/2015   Procedure: ESOPHAGOGASTRODUODENOSCOPY (EGD);  Surgeon: Wandalee Ferdinand, MD;  Location: [facility] ENDOSCOPY;  Service: Endoscopy;  Laterality: N/A;   OVARY SURGERY     TOTAL HIP ARTHROPLASTY  2004   left   TOTAL HIP ARTHROPLASTY Right 01/23/2016   Procedure: RIGHT TOTAL HIP ARTHROPLASTY ANTERIOR APPROACH;  Surgeon: Loreta Ave, MD;  Location: [facility] OR;  Service: Orthopedics;  Laterality: Right;     A IV Location/Drains/Wounds Patient Lines/Drains/Airways Status     Active Line/Drains/Airways     Name Placement date Placement time Site Days   Peripheral IV 08/15/23 20 G Anterior;Right Forearm 08/15/23  0107  Forearm  less than 1  Intake/Output Last 24 hours  Intake/Output Summary (Last 24 hours) at 08/15/2023 1237 Last data filed at 08/15/2023 0657 Gross per 24 hour  Intake 150 ml  Output --  Net 150 ml    Labs/Imaging Results for orders placed or performed during the hospital encounter of 08/14/23 (from the past 48 hour(s))  CBC with Differential     Status: None   Collection Time: 08/15/23  1:04 AM  Result Value Ref Range   WBC 5.2 4.0 - 10.5 K/uL   RBC 4.68 3.87 - 5.11 MIL/uL   Hemoglobin 13.2 12.0 - 15.0 g/dL   HCT 74.2 59.5 - 63.8 %   MCV 89.3 80.0 - 100.0 fL   MCH 28.2 26.0 - 34.0 pg   MCHC 31.6  30.0 - 36.0 g/dL   RDW 75.6 43.3 - 29.5 %   Platelets 229 150 - 400 K/uL   nRBC 0.0 0.0 - 0.2 %   Neutrophils Relative % 64 %   Neutro Abs 3.3 1.7 - 7.7 K/uL   Lymphocytes Relative 25 %   Lymphs Abs 1.3 0.7 - 4.0 K/uL   Monocytes Relative 9 %   Monocytes Absolute 0.5 0.1 - 1.0 K/uL   Eosinophils Relative 1 %   Eosinophils Absolute 0.1 0.0 - 0.5 K/uL   Basophils Relative 1 %   Basophils Absolute 0.1 0.0 - 0.1 K/uL   Immature Granulocytes 0 %   Abs Immature Granulocytes 0.01 0.00 - 0.07 K/uL    Comment: Performed at [facility], [address], [place], [place] [address]  Basic metabolic panel     Status: Abnormal   Collection Time: 08/15/23  1:04 AM  Result Value Ref Range   Sodium 139 135 - 145 mmol/L   Potassium 3.0 (L) 3.5 - 5.1 mmol/L   Chloride 103 98 - 111 mmol/L   CO2 27 22 - 32 mmol/L   Glucose, Bld 100 (H) 70 - 99 mg/dL    Comment: Glucose reference range applies only to samples taken after fasting for at least 8 hours.   BUN 10 8 - 23 mg/dL   Creatinine, Ser 6.60 0.44 - 1.00 mg/dL   Calcium 9.1 8.9 - 63.0 mg/dL   GFR, Estimated >16 >01 mL/min    Comment: (NOTE) Calculated using the CKD-EPI Creatinine Equation (2021)    Anion gap 9 5 - 15    Comment: Performed at [facility], [address], [place], [place] [address]  Troponin I (High Sensitivity)     Status: None   Collection Time: 08/15/23  1:04 AM  Result Value Ref Range   Troponin I (High Sensitivity) 9 <18 ng/L    Comment: (NOTE) Elevated high sensitivity troponin I (hsTnI) values and significant  changes across serial measurements may suggest ACS but many other  chronic and acute conditions are known to elevate hsTnI results.  Refer to the "Links" section for chest pain algorithms and additional  guidance. Performed at [facility], [address], [place], [place] [address]   Urinalysis, Routine w reflex microscopic -Urine, Clean Catch      Status: Abnormal   Collection Time: 08/15/23  1:22 AM  Result Value Ref Range   Color, Urine YELLOW (A) YELLOW   APPearance CLEAR (A) CLEAR   Specific Gravity, Urine 1.014 1.005 - 1.030   pH 5.0 5.0 - 8.0   Glucose, UA NEGATIVE NEGATIVE mg/dL   Hgb urine dipstick NEGATIVE NEGATIVE   Bilirubin Urine NEGATIVE NEGATIVE  Ketones, ur NEGATIVE NEGATIVE mg/dL   Protein, ur NEGATIVE NEGATIVE mg/dL   Nitrite NEGATIVE NEGATIVE   Leukocytes,Ua NEGATIVE NEGATIVE   RBC / HPF 0-5 0 - 5 RBC/hpf   WBC, UA 0-5 0 - 5 WBC/hpf   Bacteria, UA NONE SEEN NONE SEEN   Squamous Epithelial / HPF 0-5 0 - 5 /HPF   Mucus PRESENT     Comment: Performed at [facility], [address] [place], [place] [address]  Troponin I (High Sensitivity)     Status: None   Collection Time: 08/15/23  4:09 AM  Result Value Ref Range   Troponin I (High Sensitivity) 9 <18 ng/L    Comment: (NOTE) Elevated high sensitivity troponin I (hsTnI) values and significant  changes across serial measurements may suggest ACS but many other  chronic and acute conditions are known to elevate hsTnI results.  Refer to the "Links" section for chest pain algorithms and additional  guidance. Performed at [facility], [address] [place], [place] [address]   Magnesium     Status: None   Collection Time: 08/15/23  4:09 AM  Result Value Ref Range   Magnesium 2.2 1.7 - 2.4 mg/dL    Comment: Performed at [facility], [address], [place], [place] [address]   CT Head Wo Contrast  Result Date: 08/15/2023 CLINICAL DATA:  Altered mental status. EXAM: CT HEAD WITHOUT CONTRAST TECHNIQUE: Contiguous axial images were obtained from the base of the skull through the vertex without intravenous contrast. RADIATION DOSE REDUCTION: This exam was performed according to the departmental dose-optimization program which includes automated exposure control, adjustment of the mA and/or kV according  to patient size and/or use of iterative reconstruction technique. COMPARISON:  July 28, 2023 FINDINGS: Brain: There is mild cerebral atrophy with widening of the extra-axial spaces and ventricular dilatation. There are areas of decreased attenuation within the white matter tracts of the supratentorial brain, consistent with microvascular disease changes. A partially empty sella is seen. Vascular: No hyperdense vessel or unexpected calcification. Skull: Normal. Negative for fracture or focal lesion. Sinuses/Orbits: No acute finding. Other: None. IMPRESSION: 1. Generalized cerebral atrophy with mild, chronic white matter small vessel ischemic changes. 2. No acute intracranial abnormality. Electronically Signed   By: Aram Candela M.D.   On: 08/15/2023 03:44   DG Chest Portable 1 View  Result Date: 08/15/2023 CLINICAL DATA:  Altered mental status. EXAM: PORTABLE CHEST 1 VIEW COMPARISON:  July 28, 2023 FINDINGS: The heart size and mediastinal contours are within normal limits. There is marked severity calcification of the thoracic aorta. Both lungs are clear. The visualized skeletal structures are unremarkable. IMPRESSION: No active cardiopulmonary disease. Electronically Signed   By: Aram Candela M.D.   On: 08/15/2023 03:38    Pending Labs Unresulted Labs (From admission, onward)     Start     Ordered   08/22/23 0500  Creatinine, serum  (enoxaparin (LOVENOX)    CrCl >/= 30 ml/min)  Weekly,   R     Comments: while on enoxaparin therapy    08/15/23 0530   08/15/23 1200  Basic metabolic panel  Once-Timed,   TIMED        08/15/23 0630   08/15/23 0623  TSH  Add-on,   AD        08/15/23 9147   08/15/23 0623  T4, free  Add-on,   AD        08/15/23 8295  Vitals/Pain Today's Vitals   08/15/23 0558 08/15/23 0727 08/15/23 0940 08/15/23 1136  BP: (!) 158/94 (!) 169/86 (!) 180/107 (!) 159/85  Pulse: 83 78 82 84  Resp: 13 14 17 17   Temp: 98.2 F (36.8 C)  98.3 F (36.8 C)    TempSrc: Oral  Oral   SpO2: 98% 98% 98% 100%  Weight:      PainSc: 0-No pain       Isolation Precautions No active isolations  Medications Medications  enoxaparin (LOVENOX) injection 40 mg (has no administration in time range)  acetaminophen (TYLENOL) tablet 650 mg (has no administration in time range)  atorvastatin (LIPITOR) tablet 80 mg (has no administration in time range)  aspirin EC tablet 81 mg (has no administration in time range)  clonazePAM (KLONOPIN) tablet 0.5 mg (has no administration in time range)  donepezil (ARICEPT) tablet 10 mg (has no administration in time range)  ezetimibe (ZETIA) tablet 10 mg (has no administration in time range)  methimazole (TAPAZOLE) tablet 5 mg (has no administration in time range)  metoprolol succinate (TOPROL-XL) 24 hr tablet 25 mg (has no administration in time range)  mirtazapine (REMERON) tablet 7.5 mg (has no administration in time range)  QUEtiapine (SEROQUEL) tablet 25 mg (has no administration in time range)  prochlorperazine (COMPAZINE) injection 5 mg (has no administration in time range)  polyethylene glycol (MIRALAX / GLYCOLAX) packet 17 g (has no administration in time range)  melatonin tablet 3 mg (has no administration in time range)  haloperidol lactate (HALDOL) injection 1 mg (has no administration in time range)  lactated ringers 1,000 mL with potassium chloride 40 mEq infusion (has no administration in time range)  potassium chloride 10 mEq in 100 mL IVPB (0 mEq Intravenous Stopped 08/15/23 1006)  0.9 %  sodium chloride infusion ( Intravenous New Bag/Given 08/15/23 0447)  magnesium sulfate IVPB 2 g 50 mL (0 g Intravenous Stopped 08/15/23 0657)    Mobility non-ambulatory     Focused Assessments     R Recommendations: See Admitting Provider Note  Report given to:   Additional Notes:

## 2023-08-15 NOTE — Progress Notes (Signed)
Physical Therapy Evaluation Patient Details Name: Kristine [name] MRN: [id number] DOB: [birth year] Today's Date: 08/15/2023  History of Present Illness  Pt is an 82yo female presenting to [facility] ED on 9/6 from home (recently discharged from [employer] [facility]) for AMS and unable to ambulate indepdnently. CT negative for acute findings.  PMH: Lewy-Body dementia, HTN, HLD, CAD, COPD, OA, PVD, hx of STEMI 2011, hx of stroke, b/l THA.  Clinical Impression  Pt admitted with above diagnosis.  Pt currently with functional limitations due to the deficits listed below (see PT Problem List). Pt A&Ox1 only, with mobility begins trembling. Required maxA for bed mobility and transfers, able to stand and take sidesteps EOB but unable to progress to ambulation at this time. Pt repeatedly trying to get up so set bed alarm on highest setting. Family not present at time of evaluation to verify PLOF and home environment. Pt will benefit from acute skilled PT to increase their independence and safety with mobility to allow discharge.          If plan is discharge home, recommend the following: A little help with walking and/or transfers;Two people to help with walking and/or transfers;Assistance with cooking/housework;Assistance with feeding;Direct supervision/assist for medications management;Direct supervision/assist for financial management;Assist for transportation;Help with stairs or ramp for entrance;Supervision due to cognitive status   Can travel by private vehicle   No    Equipment Recommendations None recommended by PT (TBD at SNF)  Recommendations for Other Services       Functional Status Assessment Patient has had a recent decline in their functional status and demonstrates the ability to make significant improvements in function in a reasonable and predictable amount of time.     Precautions / Restrictions Precautions Precautions: Fall Restrictions Weight Bearing Restrictions: No      Mobility  Bed  Mobility Overal bed mobility: Needs Assistance Bed Mobility: Supine to Sit, Sit to Supine     Supine to sit: HOB elevated, Max assist Sit to supine: Max assist, HOB elevated   General bed mobility comments: Pt confused and with bed mobility begins trembling movements, requires maxA for bed mobility, likely secondary to cogition.    Transfers Overall transfer level: Needs assistance Equipment used: Rolling walker (2 wheels) Transfers: Sit to/from Stand Sit to Stand: Mod assist           General transfer comment: pt required mod-maxA for sTS to RW and to take STS at EOB, minA for correction of posterior lean.    Ambulation/Gait               General Gait Details: Unsafe at present  Stairs            Wheelchair Mobility     Tilt Bed    Modified Rankin (Stroke Patients Only)       Balance Overall balance assessment: Needs assistance Sitting-balance support: Feet supported, Bilateral upper extremity supported Sitting balance-Leahy Scale: Fair     Standing balance support: Reliant on assistive device for balance, During functional activity, Bilateral upper extremity supported Standing balance-Leahy Scale: Poor Standing balance comment: posterior lean                             Pertinent Vitals/Pain Pain Assessment Pain Assessment: No/denies pain    Home Living Family/patient expects to be discharged to:: Private residence Living Arrangements: Spouse/significant other;Children Available Help at Discharge: Family;Available 24 hours/day Type of Home: House Home Access: Stairs to enter  Entrance Stairs-Rails: None Entrance Stairs-Number of Steps: 2 Alternate Level Stairs-Number of Steps: flight Home Layout: Two level Home Equipment: Agricultural consultant (2 wheels);Cane - single point;BSC/3in1 Additional Comments: Hx from prior PT note    Prior Function Prior Level of Function : Needs assist  Cognitive Assist : Mobility (cognitive)            Mobility Comments: family assists  patient with ambul;ation, to BR ADLs Comments: family assists with Mady Haagensen abd post B/B     Extremity/Trunk Assessment   Upper Extremity Assessment Upper Extremity Assessment: Generalized weakness    Lower Extremity Assessment Lower Extremity Assessment: Generalized weakness    Cervical / Trunk Assessment Cervical / Trunk Assessment: Kyphotic  Communication   Communication Communication: No apparent difficulties  Cognition Arousal: Lethargic Behavior During Therapy: WFL for tasks assessed/performed, Flat affect Overall Cognitive Status: No family/caregiver present to determine baseline cognitive functioning                                 General Comments: Pt alert and oriented only to self, could not name husband's name, thinks she is at school.        General Comments      Exercises     Assessment/Plan    PT Assessment Patient needs continued PT services  PT Problem List Decreased strength;Decreased range of motion;Decreased activity tolerance;Decreased balance;Decreased mobility;Decreased coordination;Decreased cognition;Decreased knowledge of use of DME;Decreased safety awareness       PT Treatment Interventions DME instruction;Gait training;Stair training;Functional mobility training;Therapeutic activities;Therapeutic exercise;Balance training;Neuromuscular re-education;Cognitive remediation;Patient/family education;Wheelchair mobility training    PT Goals (Current goals can be found in the Care Plan section)  Acute Rehab PT Goals Patient Stated Goal: none stated PT Goal Formulation: Patient unable to participate in goal setting Time For Goal Achievement: 08/29/23 Potential to Achieve Goals: Fair    Frequency Min 1X/week     Co-evaluation               AM-PAC PT "6 Clicks" Mobility  Outcome Measure Help needed turning from your back to your side while in a flat bed without using bedrails?: A  Lot Help needed moving from lying on your back to sitting on the side of a flat bed without using bedrails?: A Lot Help needed moving to and from a bed to a chair (including a wheelchair)?: A Lot Help needed standing up from a chair using your arms (e.g., wheelchair or bedside chair)?: A Lot Help needed to walk in hospital room?: Total Help needed climbing 3-5 steps with a railing? : Total 6 Click Score: 10    End of Session Equipment Utilized During Treatment: Gait belt Activity Tolerance: Patient tolerated treatment well;No increased pain Patient left: in bed;with call bell/phone within reach;with bed alarm set Nurse Communication: Mobility status PT Visit Diagnosis: Other abnormalities of gait and mobility (R26.89)    Time: 5784-6962 PT Time Calculation (min) (ACUTE ONLY): 35 min   Charges:   PT Evaluation $PT Eval Low Complexity: 1 Low PT Treatments $Therapeutic Activity: 23-37 mins PT General Charges $$ ACUTE PT VISIT: 1 Visit         Jamesetta Geralds, PT, DPT [facility] Rehabilitation Department Office: [phone]  Jamesetta Geralds 08/15/2023, 3:57 PM

## 2023-08-16 DIAGNOSIS — F0280 Dementia in other diseases classified elsewhere without behavioral disturbance: Secondary | ICD-10-CM

## 2023-08-16 DIAGNOSIS — Z7189 Other specified counseling: Secondary | ICD-10-CM | POA: Diagnosis not present

## 2023-08-16 DIAGNOSIS — G3183 Dementia with Lewy bodies: Secondary | ICD-10-CM

## 2023-08-16 DIAGNOSIS — Z515 Encounter for palliative care: Secondary | ICD-10-CM

## 2023-08-16 DIAGNOSIS — Z66 Do not resuscitate: Secondary | ICD-10-CM

## 2023-08-16 DIAGNOSIS — F02C0 Dementia in other diseases classified elsewhere, severe, without behavioral disturbance, psychotic disturbance, mood disturbance, and anxiety: Secondary | ICD-10-CM

## 2023-08-16 DIAGNOSIS — F02C18 Dementia in other diseases classified elsewhere, severe, with other behavioral disturbance: Secondary | ICD-10-CM | POA: Diagnosis not present

## 2023-08-16 DIAGNOSIS — R4182 Altered mental status, unspecified: Secondary | ICD-10-CM | POA: Diagnosis not present

## 2023-08-16 LAB — T4, FREE: Free T4: 1.18 ng/dL — ABNORMAL HIGH (ref 0.61–1.12)

## 2023-08-16 NOTE — Progress Notes (Signed)
PROGRESS NOTE  Kristine [name] IRJ:[id number] DOB: 16-Apr-1941   PCP: Renaye Rakers, MD  Patient is from: Home.  Brought by EMS.  DOA: 08/14/2023 LOS: 1  Chief complaints Chief Complaint  Patient presents with   Failure To Thrive     Brief Narrative / Interim history: 82 year old F with PMH of Lewy body dementia, COPD, diastolic CHF, hyperthyroidism and HTN brought to ED by EMS due to "failure to thrive".  Per family, patient was recently released from [facility] has been sleeping a lot more than normal and hard to engage.  In ED, vital stable.  Labs showed hypokalemia.  CT head without acute finding other than generalized cerebral atrophy.  CXR without acute finding.  Patient was admitted for altered mental status and failure to thrive.   No reversible etiology of altered mental status.  Likely confusional state/delirium in the setting of Lewy body dementia.    Subjective: Seen and examined earlier this morning.  No major events overnight of this morning.  No complaints but not a great historian.  She is awake and alert but only oriented to self and "hospital".  Follows some commands.  She says "banana banana" when asked to name the name of the hospital.  Objective: Vitals:   08/16/23 0500 08/16/23 0557 08/16/23 1036 08/16/23 1058  BP:  (!) 140/82 (!) 167/89   Pulse:  83 80   Resp:  19 12 14   Temp:  98.3 F (36.8 C) 98.2 F (36.8 C)   TempSrc:  Oral Oral   SpO2:  99% 98%   Weight: 53.8 kg       Examination:  GENERAL: No apparent distress.  Nontoxic. HEENT: MMM.  Vision and hearing grossly intact.  NECK: Supple.  No apparent JVD.  RESP:  No IWOB.  Fair aeration bilaterally. CVS:  RRR. Heart sounds normal.  ABD/GI/GU: BS+. Abd soft, NTND.  MSK/EXT: Difficult muscle mass and subcu fat loss.  Some degree of contractures in lower extremities. SKIN: no apparent skin lesion or wound NEURO: Awake and alert.  Oriented to self and "hospital".  She says "banana banana" when asked  about the name of the hospital.  No apparent focal neuro deficit but seems to have contractures in lower extremities.Marland Kitchen PSYCH: Calm.  No distress or agitation.  Procedures:  None  Microbiology summarized: None  Assessment and plan: Principal Problem:   AMS (altered mental status)  Confusional state/delirium in patient with Lewy body dementia: Dementia seems to be advanced.  Awake and alert but only oriented to self and "hospital".  Follows some commands.  CT head without acute finding but consistent with dementia.  Free T4 slightly elevated but TSH normal.  Low suspicion for infection.  UA negative. -Reorientation and delirium precaution -Minimize or avoid sedating medications -Agree with decreasing Klonopin to 0.5 mg at night -Continue low-dose Seroquel at night. -Continue home Aricept. -IV Haldol as needed agitation   Coronary artery disease: Stable -Resume aspirin, statin, Zetia and metoprolol.   Essential hypertension: Stable -Continue home Toprol-XL -Avoid HCTZ due to risk of dehydration and AKI   Chronic diastolic heart failure: Appears euvolemic.  Not on diuretics other than HCTZ -Hold home HCTZ  Hyperthyroidism: Seems to be on methimazole.  Free T4 slightly elevated but improved.  TSH normal. -Continue home methimazole   Hypokalemia: Resolved.   Chronic anxiety: Stable. -Reduced dose of Klonopin as above   Ambulatory dysfunction: -PT/OT  Failure to thrive: Likely due to dementia. Body mass index is 20.36 kg/m. -Consult  palliative care      DVT prophylaxis:  enoxaparin (LOVENOX) injection 40 mg Start: 08/15/23 1000  Code Status: Full code. Family Communication: None at bedside Level of care: Telemetry Status is: Inpatient Remains inpatient appropriate because: Disposition/placement   Final disposition: TBD Consultants:  Palliative medicine  35 minutes with more than 50% spent in reviewing records, counseling patient/family and coordinating  care.   Sch Meds:  Scheduled Meds:  aspirin EC  81 mg Oral Daily   atorvastatin  80 mg Oral Daily   clonazePAM  0.5 mg Oral QHS   donepezil  5 mg Oral QHS   enoxaparin (LOVENOX) injection  40 mg Subcutaneous Daily   ezetimibe  10 mg Oral Daily   melatonin  3 mg Oral QHS   methimazole  5 mg Oral BID   metoprolol succinate  25 mg Oral Daily   mirtazapine  7.5 mg Oral QHS   QUEtiapine  25 mg Oral QHS   Continuous Infusions: PRN Meds:.acetaminophen, haloperidol lactate, hydrALAZINE, polyethylene glycol, prochlorperazine  Antimicrobials: Anti-infectives (From admission, onward)    None        I have personally reviewed the following labs and images: CBC: Recent Labs  Lab 08/15/23 0104  WBC 5.2  NEUTROABS 3.3  HGB 13.2  HCT 41.8  MCV 89.3  PLT 229   BMP &GFR Recent Labs  Lab 08/15/23 0104 08/15/23 0409 08/15/23 1414  NA 139  --  138  K 3.0*  --  3.7  CL 103  --  102  CO2 27  --  25  GLUCOSE 100*  --  102*  BUN 10  --  7*  CREATININE 0.92  --  0.80  CALCIUM 9.1  --  8.8*  MG  --  2.2  --    Estimated Creatinine Clearance: 47.6 mL/min (by C-G formula based on SCr of 0.8 mg/dL). Liver & Pancreas: No results for input(s): "AST", "ALT", "ALKPHOS", "BILITOT", "PROT", "ALBUMIN" in the last 168 hours. No results for input(s): "LIPASE", "AMYLASE" in the last 168 hours. No results for input(s): "AMMONIA" in the last 168 hours. Diabetic: No results for input(s): "HGBA1C" in the last 72 hours. No results for input(s): "GLUCAP" in the last 168 hours. Cardiac Enzymes: No results for input(s): "CKTOTAL", "CKMB", "CKMBINDEX", "TROPONINI" in the last 168 hours. No results for input(s): "PROBNP" in the last 8760 hours. Coagulation Profile: No results for input(s): "INR", "PROTIME" in the last 168 hours. Thyroid Function Tests: Recent Labs    08/15/23 1414  TSH 0.794  FREET4 1.18*   Lipid Profile: No results for input(s): "CHOL", "HDL", "LDLCALC", "TRIG",  "CHOLHDL", "LDLDIRECT" in the last 72 hours. Anemia Panel: No results for input(s): "VITAMINB12", "FOLATE", "FERRITIN", "TIBC", "IRON", "RETICCTPCT" in the last 72 hours. Urine analysis:    Component Value Date/Time   COLORURINE YELLOW (A) 08/15/2023 0122   APPEARANCEUR CLEAR (A) 08/15/2023 0122   LABSPEC 1.014 08/15/2023 0122   PHURINE 5.0 08/15/2023 0122   GLUCOSEU NEGATIVE 08/15/2023 0122   HGBUR NEGATIVE 08/15/2023 0122   BILIRUBINUR NEGATIVE 08/15/2023 0122   KETONESUR NEGATIVE 08/15/2023 0122   PROTEINUR NEGATIVE 08/15/2023 0122   UROBILINOGEN 0.2 03/09/2015 2216   NITRITE NEGATIVE 08/15/2023 0122   LEUKOCYTESUR NEGATIVE 08/15/2023 0122   Sepsis Labs: Invalid input(s): "PROCALCITONIN", "LACTICIDVEN"  Microbiology: No results found for this or any previous visit (from the past 240 hour(s)).  Radiology Studies: No results found.    Symon Norwood T. Kairee Kozma [facility]  If 7PM-7AM, please contact night-coverage [email or web address]  08/16/2023, 2:04 PM

## 2023-08-16 NOTE — Plan of Care (Signed)
  Problem: Clinical Measurements: Goal: Ability to maintain clinical measurements within normal limits will improve Outcome: Progressing Goal: Diagnostic test results will improve Outcome: Progressing   Problem: Safety: Goal: Ability to remain free from injury will improve Outcome: Progressing   Problem: Skin Integrity: Goal: Risk for impaired skin integrity will decrease Outcome: Progressing   Problem: Education: Goal: Knowledge of General Education information will improve Description: Including pain rating scale, medication(s)/side effects and non-pharmacologic comfort measures Outcome: Not Progressing Note: Pt confused.    Problem: Health Behavior/Discharge Planning: Goal: Ability to manage health-related needs will improve Outcome: Not Progressing

## 2023-08-16 NOTE — Consult Note (Signed)
Consultation Note Date: 08/16/2023   Patient Name: Kristine [name]  DOB: 20-Dec-1940  MRN: [id number]  Age / Sex: 82 y.o., female  PCP: Renaye Rakers, MD Referring Physician: Almon Hercules, MD  Reason for Consultation: Establishing goals of care  HPI/Patient Profile: 82 y.o. female  with past medical history of Lewy body dementia, stroke, hypertension, hyperlipidemia, CAD, PVD, COPD, osteoarthritis admitted on 08/14/2023 with altered mental status.   Clinical Assessment and Goals of Care: Consult received and extensive chart review completed. Noted struggles with managing at home with multiple telephone calls from family with concerns for sleep and caring for Ms. Jewel Baize. There is no identified reversible etiology to her decline. Concern for progression of her dementia and failure to thrive.   I left voicemail with daughter Sheral Flow. I was able to speak with husband, Colen. Mr. Hillard Danker shares his struggles to care for his wife. He is very worried about her health as well as his own. He talks about her decline since returning home from [facility]. He felt she did very well at [facility] and was walking independently with walker but since returning home she has declined with more weakness, he feeds her, and her sleep patterns are off which does not allow Mr. Colen to get any sleep himself. He tearfully shares with me that he cannot continue to take care of her like this. He tells me that they have been married 59 years and how painful it is to see her in declined health. He understands that this is progression of her dementia. I spoke with him about the potential of placement for her and he seems interested. He seems to believe that placement beyond short term rehab would not be an option financially. I do not know the details of their Medicaid denial if this was for regular Medicaid or LTC.   I also had a chance to speak with  daughter, Sheral Flow. Edwina and I review her mother's condition and progression of dementia. Sheral Flow is a Engineer, civil (consulting) and understands. We discussed the need for further discussion of difficult decisions and goals of care. Sheral Flow understands this need but recognizes that this is going to be extremely difficult on her father. Sheral Flow does share that she had a sister that previously died and this was extremely difficult for her father understandably. I will await return call from Edwina to determine timing of family meeting to discuss goals of care. Will need to discuss with daughter, Mickeal Needy, about Medicaid and LTC.   All questions/concerns addressed to best of my ability. Emotional support provided.   Primary Decision Maker NEXT OF KIN husband with the support of their 2 daughters    SUMMARY OF RECOMMENDATIONS   - Awaiting family meeting  Code Status/Advance Care Planning: Full code   Symptom Management:  Per attending.  D/C haldol and consider d/c Seroquel with underlying Lewy body dementia as this can worsen symptoms.   Prognosis:  Overall prognosis poor.   Discharge Planning: To Be Determined      Primary Diagnoses: Present  on Admission:  AMS (altered mental status)   I have reviewed the medical record, interviewed the patient and family, and examined the patient. The following aspects are pertinent.  Past Medical History:  Diagnosis Date   COPD (chronic obstructive pulmonary disease) (HCC)    Hard of hearing    History of anemia    History of blood transfusion    History of pneumonia    HTN (hypertension)    Hyperlipidemia    Hyperthyroidism    treated wtih radioactive iodine   OA (osteoarthritis of spine)    PVD (peripheral vascular disease) (HCC)    Lower extremity Dopplers  May 2revealed an ABI of 0.83 in the right posterior    Shortness of breath dyspnea    STEMI (ST elevation myocardial infarction) [facility]) May 2011   Promus DES in the proximal, mid and distal RCA in May  2011   Stroke [facility])    Urinary frequency    Urinary urgency    Social History   Socioeconomic History   Marital status: Married    Spouse name: Cabin crew   Number of children: 3   Years of education: Masters   Highest education level: Not on file  Occupational History   Occupation: Retired  Tobacco Use   Smoking status: Former    Current packs/day: 0.00    Average packs/day: 1 pack/day for 45.0 years (45.0 ttl pk-yrs)    Types: Cigarettes    Start date: 04/07/1965    Quit date: 04/07/2010    Years since quitting: 13.3   Smokeless tobacco: Never  Vaping Use   Vaping status: Never Used  Substance and Sexual Activity   Alcohol use: Yes    Comment: rarely    Drug use: No   Sexual activity: Not on file  Other Topics Concern   Not on file  Social History Narrative   Lives with husband, daughter and her son   Right handed   Tea sometimes, decaf coffee   Social Determinants of Health   Financial Resource Strain: Not on file  Food Insecurity: Not on file  Transportation Needs: Not on file  Physical Activity: Not on file  Stress: Not on file  Social Connections: Not on file   Family History  Problem Relation Age of Onset   Peripheral vascular disease Mother    Congestive Heart Failure Mother    Heart attack Father    Scheduled Meds:  aspirin EC  81 mg Oral Daily   atorvastatin  80 mg Oral Daily   clonazePAM  0.5 mg Oral QHS   donepezil  5 mg Oral QHS   enoxaparin (LOVENOX) injection  40 mg Subcutaneous Daily   ezetimibe  10 mg Oral Daily   melatonin  3 mg Oral QHS   methimazole  5 mg Oral BID   metoprolol succinate  25 mg Oral Daily   mirtazapine  7.5 mg Oral QHS   QUEtiapine  25 mg Oral QHS   Continuous Infusions: PRN Meds:.acetaminophen, haloperidol lactate, hydrALAZINE, polyethylene glycol, prochlorperazine Allergies  Allergen Reactions   Ace Inhibitors Swelling    Angioedema of the tongue   Iodinated Contrast Media Anaphylaxis   Other Anaphylaxis     Allergic to melons, nuts   Shellfish-Derived Products Anaphylaxis   Hydrocodone Other (See Comments)    unspecified   Review of Systems  Unable to perform ROS: Dementia    Physical Exam Vitals and nursing note reviewed.  Constitutional:      General: She is not  in acute distress.    Appearance: She is ill-appearing.  Cardiovascular:     Rate and Rhythm: Normal rate.  Pulmonary:     Effort: No tachypnea, accessory muscle usage or respiratory distress.  Abdominal:     Palpations: Abdomen is soft.  Neurological:     Mental Status: She is alert. She is disoriented and confused.     Comments: Oriented to self. Able to name her daughters. Good spirits.      Vital Signs: BP (!) 140/82 (BP Location: Left Arm)   Pulse 83   Temp 98.3 F (36.8 C) (Oral)   Resp 19   Wt 53.8 kg   SpO2 99%   BMI 20.36 kg/m  Pain Scale: Faces   Pain Score: Asleep   SpO2: SpO2: 99 % O2 Device:SpO2: 99 % O2 Flow Rate: .   IO: Intake/output summary:  Intake/Output Summary (Last 24 hours) at 08/16/2023 0925 Last data filed at 08/15/2023 1800 Gross per 24 hour  Intake 122.5 ml  Output 400 ml  Net -277.5 ml    LBM:   Baseline Weight: Weight: 51 kg Most recent weight: Weight: 53.8 kg     Palliative Assessment/Data:     Time Total: 80 min  Greater than 50%  of this time was spent counseling and coordinating care related to the above assessment and plan.  Signed by: Yong Channel, NP Palliative Medicine Team Pager # [phone] (M-F 8a-5p) Team Phone # [phone] (Nights/Weekends)

## 2023-08-16 NOTE — Plan of Care (Signed)
  Problem: Education: Goal: Knowledge of General Education information will improve Description: Including pain rating scale, medication(s)/side effects and non-pharmacologic comfort measures Outcome: Progressing   Problem: Health Behavior/Discharge Planning: Goal: Ability to manage health-related needs will improve Outcome: Progressing   Problem: Clinical Measurements: Goal: Respiratory complications will improve Outcome: Progressing   Problem: Elimination: Goal: Will not experience complications related to bowel motility Outcome: Progressing   Problem: Pain Managment: Goal: General experience of comfort will improve Outcome: Progressing

## 2023-08-16 NOTE — Evaluation (Signed)
Occupational Therapy Evaluation Patient Details Name: Kristine [name] MRN: [id number] DOB: 1941/04/26 Today's Date: 08/16/2023   History of Present Illness Pt is an 82yo female presenting to [facility] ED on 9/6 from home (recently discharged from [facility]) for AMS and unable to ambulate indepdnently. CT negative for acute findings.  PMH: Lewy-Body dementia, HTN, HLD, CAD, COPD, OA, PVD, hx of STEMI 2011, hx of stroke, b/l THA.   Clinical Impression   Pt admitted with the above diagnoses and presents with below problem list. Pt will benefit from continued acute OT to address the below listed deficits and maximize independence with basic ADLs prior to d/c. PTA pt needed assistance with toilet transfers and bathing. Pt currently needs max A with UB ADLs, max A in sitting/lateral lean to bed level for pericare and LB ADLs. Pt currently needs +2 for functional transfers, max A with bed mobility. Pt able to sit EOB for a few minutes with CGA.        If plan is discharge home, recommend the following:      Functional Status Assessment  Patient has had a recent decline in their functional status and demonstrates the ability to make significant improvements in function in a reasonable and predictable amount of time.  Equipment Recommendations  Other (comment) (defer to next venue)    Recommendations for Other Services       Precautions / Restrictions Precautions Precautions: Fall Restrictions Weight Bearing Restrictions: No      Mobility Bed Mobility Overal bed mobility: Needs Assistance Bed Mobility: Supine to Sit, Sit to Supine     Supine to sit: HOB elevated, Max assist Sit to supine: Max assist, HOB elevated   General bed mobility comments: difficulty sequencing, tremulous UE movements when attempting to come to EOB. Max physical assist for all aspects.    Transfers                   General transfer comment: unable to stand with +1 assist this session      Balance  Overall balance assessment: Needs assistance Sitting-balance support: Feet supported, Bilateral upper extremity supported Sitting balance-Leahy Scale: Fair                                     ADL either performed or assessed with clinical judgement   ADL Overall ADL's : Needs assistance/impaired Eating/Feeding: Maximal assistance;Sitting   Grooming: Maximal assistance;Sitting   Upper Body Bathing: Maximal assistance;Sitting   Lower Body Bathing: Sitting/lateral leans;Maximal assistance;Bed level   Upper Body Dressing : Maximal assistance;Sitting   Lower Body Dressing: Maximal assistance;Sitting/lateral leans;Bed level                 General ADL Comments: Pt able to sit EOB a few minutes at CGA level. Unable to initiate standing with only +1 assist.     Vision Baseline Vision/History: 1 Wears glasses       Perception         Praxis         Pertinent Vitals/Pain Pain Assessment Pain Assessment: No/denies pain     Extremity/Trunk Assessment Upper Extremity Assessment Upper Extremity Assessment: Generalized weakness   Lower Extremity Assessment Lower Extremity Assessment: Defer to PT evaluation   Cervical / Trunk Assessment Cervical / Trunk Assessment: Kyphotic   Communication     Cognition Arousal: Alert Behavior During Therapy: WFL for tasks assessed/performed, Flat affect Overall Cognitive  Status: No family/caregiver present to determine baseline cognitive functioning                                       General Comments       Exercises     Shoulder Instructions      Home Living Family/patient expects to be discharged to:: Private residence Living Arrangements: Spouse/significant other;Children Available Help at Discharge: Family;Available 24 hours/day Type of Home: House Home Access: Stairs to enter [employer] of Steps: 2 Entrance Stairs-Rails: None Home Layout: Two level Alternate Level  Stairs-Number of Steps: flight   Bathroom Shower/Tub: Sponge bathes at baseline   [employer]: Standard Bathroom Accessibility: Yes   Home Equipment: Agricultural consultant (2 wheels);Cane - single point;BSC/3in1   Additional Comments: Hx from prior PT note      Prior Functioning/Environment Prior Level of Function : Needs assist  Cognitive Assist : Mobility (cognitive)           Mobility Comments: family assists patient with mobility to BR ADLs Comments: family assists with bathing        OT Problem List: Decreased strength;Decreased activity tolerance;Impaired balance (sitting and/or standing);Decreased cognition;Decreased knowledge of precautions;Decreased knowledge of use of DME or AE      OT Treatment/Interventions: Self-care/ADL training;Therapeutic exercise;Neuromuscular education;DME and/or AE instruction;Therapeutic activities;Balance training;Patient/family education    OT Goals(Current goals can be found in the care plan section) Acute Rehab OT Goals Patient Stated Goal: not stated OT Goal Formulation: With patient Time For Goal Achievement: 08/30/23 Potential to Achieve Goals: Fair ADL Goals Pt Will Perform Upper Body Dressing: with min assist;sitting Pt Will Perform Lower Body Dressing: with min assist;sitting/lateral leans Pt Will Transfer to Toilet: with +2 assist;with min assist;squat pivot transfer Additional ADL Goal #1: Pt will complete bed mobility at mod A level to prepare for EOB/OOB ADLs.  OT Frequency: Min 2X/week    Co-evaluation              AM-PAC OT "6 Clicks" Daily Activity     Outcome Measure Help from another person eating meals?: A Lot Help from another person taking care of personal grooming?: A Lot Help from another person toileting, which includes using toliet, bedpan, or urinal?: Total Help from another person bathing (including washing, rinsing, drying)?: Total Help from another person to put on and taking off regular upper body  clothing?: A Lot Help from another person to put on and taking off regular lower body clothing?: Total 6 Click Score: 9   End of Session Equipment Utilized During Treatment: Rolling walker (2 wheels)  Activity Tolerance: Patient tolerated treatment well;Patient limited by fatigue Patient left: in bed;with call bell/phone within reach  OT Visit Diagnosis: Unsteadiness on feet (R26.81);Muscle weakness (generalized) (M62.81);Other symptoms and signs involving the nervous system (R29.898)                Time: 1610-9604 OT Time Calculation (min): 26 min Charges:  OT General Charges $OT Visit: 1 Visit OT Evaluation $OT Eval Moderate Complexity: 1 Mod OT Treatments $Self Care/Home Management : 8-22 mins  Raynald Kemp, OT Acute Rehabilitation Services Office: [phone]   Pilar Grammes 08/16/2023, 3:22 PM

## 2023-08-17 DIAGNOSIS — R4182 Altered mental status, unspecified: Secondary | ICD-10-CM | POA: Diagnosis not present

## 2023-08-17 DIAGNOSIS — F02C18 Dementia in other diseases classified elsewhere, severe, with other behavioral disturbance: Secondary | ICD-10-CM | POA: Diagnosis not present

## 2023-08-17 DIAGNOSIS — G3183 Dementia with Lewy bodies: Secondary | ICD-10-CM | POA: Diagnosis not present

## 2023-08-17 LAB — CBC
HCT: 42.7 % (ref 36.0–46.0)
Hemoglobin: 13.4 g/dL (ref 12.0–15.0)
MCH: 28.2 pg (ref 26.0–34.0)
MCHC: 31.4 g/dL (ref 30.0–36.0)
MCV: 89.7 fL (ref 80.0–100.0)
Platelets: 302 10*3/uL (ref 150–400)
RBC: 4.76 MIL/uL (ref 3.87–5.11)
RDW: 15.5 % (ref 11.5–15.5)
WBC: 6.2 10*3/uL (ref 4.0–10.5)
nRBC: 0 % (ref 0.0–0.2)

## 2023-08-17 LAB — RENAL FUNCTION PANEL
Albumin: 3.2 g/dL — ABNORMAL LOW (ref 3.5–5.0)
Anion gap: 10 (ref 5–15)
BUN: 8 mg/dL (ref 8–23)
CO2: 27 mmol/L (ref 22–32)
Calcium: 9.5 mg/dL (ref 8.9–10.3)
Chloride: 101 mmol/L (ref 98–111)
Creatinine, Ser: 0.82 mg/dL (ref 0.44–1.00)
GFR, Estimated: >60 mL/min (ref >60)
Glucose, Bld: 103 mg/dL — ABNORMAL HIGH (ref 70–99)
Phosphorus: 3.4 mg/dL (ref 2.5–4.6)
Potassium: 3.5 mmol/L (ref 3.5–5.1)
Sodium: 138 mmol/L (ref 135–145)

## 2023-08-17 LAB — MAGNESIUM: Magnesium: 2.1 mg/dL (ref 1.7–2.4)

## 2023-08-17 MED ORDER — POTASSIUM CHLORIDE CRYS ER 20 MEQ PO TBCR
40.0000 meq | EXTENDED_RELEASE_TABLET | Freq: Once | ORAL | Status: AC
  Administered 2023-08-17: 40 meq via ORAL
  Filled 2023-08-17: qty 2

## 2023-08-17 NOTE — Plan of Care (Signed)
  Problem: Clinical Measurements: Goal: Ability to maintain clinical measurements within normal limits will improve Outcome: Progressing Goal: Will remain free from infection Outcome: Progressing Goal: Cardiovascular complication will be avoided Outcome: Progressing   Problem: Activity: Goal: Risk for activity intolerance will decrease Outcome: Progressing   Problem: Coping: Goal: Level of anxiety will decrease Outcome: Progressing   Problem: Safety: Goal: Ability to remain free from injury will improve Outcome: Progressing

## 2023-08-17 NOTE — Progress Notes (Signed)
Palliative:  No family at bedside. Kristine [name] is smiling and is pleasantly confused. Awaiting time for family meeting to further discuss goals of care. Social work assessing for potential rehab options and return to [address]. I will follow up again tomorrow.   No charge  Yong Channel, NP Palliative Medicine Team Pager [phone] (Please see [email or web address] for schedule) Team Phone [phone]

## 2023-08-17 NOTE — TOC Progression Note (Signed)
Transition of Care [facility]) - Progression Note    Patient Details  Name: Kristine [name] MRN: [id number] Date of Birth: 06/09/1941  [facility] of [facility] CM/SW Contact  Geni Bers, RN Phone Number: 08/17/2023, 1:01 PM  Clinical Narrative:     Pt is active with [facility] for PT.        Expected Discharge Plan and Services                                               Social Determinants of Health (SDOH) Interventions SDOH Screenings   Depression (PHQ2-9): Low Risk  (03/20/2023)  Tobacco Use: Medium Risk (08/14/2023)    Readmission Risk Interventions     No data to display

## 2023-08-17 NOTE — Progress Notes (Signed)
PROGRESS NOTE  Kristine [name] RUE:[id number] DOB: 26-May-1941   PCP: Renaye Rakers, MD  Patient is from: Home.  Brought by EMS.  DOA: 08/14/2023 LOS: 2  Chief complaints Chief Complaint  Patient presents with   Failure To Thrive     Brief Narrative / Interim history: 82 year old F with PMH of Lewy body dementia, COPD, diastolic CHF, hyperthyroidism and HTN brought to ED by EMS due to "failure to thrive".  Per family, patient was recently released from [facility] has been sleeping a lot more than normal and hard to engage.  In ED, vital stable.  Labs showed hypokalemia.  CT head without acute finding other than generalized cerebral atrophy.  CXR without acute finding.  Patient was admitted for altered mental status and failure to thrive.   No reversible etiology of altered mental status.  Likely confusional state/delirium in the setting of Lewy body dementia.  Palliative to meet with family for goal of care discussion.     Subjective: Seen and examined earlier this morning.  No major events overnight of this morning.  No complaints but not a reliable historian.  She is only oriented to self.  Does not appear to be in distress.  Objective: Vitals:   08/16/23 1058 08/16/23 1956 08/17/23 0452 08/17/23 0455  BP:  (!) 154/102 (!) 145/74   Pulse:  69 78   Resp: 14 16 16    Temp:  97.7 F (36.5 C) 98.2 F (36.8 C)   TempSrc:      SpO2:  98% 100%   Weight:    52.7 kg    Examination:  GENERAL: No apparent distress.  Nontoxic. HEENT: MMM.  Vision and hearing grossly intact.  NECK: Supple.  No apparent JVD.  RESP:  No IWOB.  Fair aeration bilaterally. CVS:  RRR. Heart sounds normal.  ABD/GI/GU: BS+. Abd soft, NTND.  MSK/EXT: Difficult muscle mass and subcu fat loss.  Some degree of contractures in lower extremities. SKIN: no apparent skin lesion or wound NEURO: Awake and alert.  Oriented only to self.  No apparent focal neuro deficit but seems to have contractures in lower  extremities.Marland Kitchen PSYCH: Calm.  No distress or agitation.  Procedures:  None  Microbiology summarized: None  Assessment and plan: Principal Problem:   AMS (altered mental status)  Confusional state/delirium in patient with Lewy body dementia: Dementia seems to be advanced.  Awake and alert but only oriented to self and "hospital".  Follows some commands.  CT head without acute finding but consistent with dementia.  Free T4 slightly elevated but TSH normal.  Low suspicion for infection.  UA negative. -Reorientation and delirium precaution -Minimize or avoid sedating medications -Agree with decreasing Klonopin to 0.5 mg at night -Continue low-dose Seroquel at night. -Continue home Aricept. -IV Haldol as needed agitation   Coronary artery disease: Stable -Resume aspirin, statin, Zetia and metoprolol.   Essential hypertension: Stable -Continue home Toprol-XL -Avoid HCTZ due to risk of dehydration and AKI   Chronic diastolic heart failure: Appears euvolemic.  Not on diuretics other than HCTZ -Hold home HCTZ  Hyperthyroidism: Seems to be on methimazole.  Free T4 slightly elevated but improved.  TSH normal. -Continue home methimazole   Hypokalemia: Resolved.   Chronic anxiety: Stable. -Reduced dose of Klonopin as above   Ambulatory dysfunction: -PT/OT-recommended SNF  Failure to thrive: Likely due to dementia. Body mass index is 19.95 kg/m. -Palliative medicine following.      DVT prophylaxis:  enoxaparin (LOVENOX) injection 40 mg Start: 08/15/23  1000  Code Status: Full code. Family Communication: None at bedside Level of care: Telemetry Status is: Inpatient Remains inpatient appropriate because: Disposition/placement   Final disposition: SNF? Consultants:  Palliative medicine  35 minutes with more than 50% spent in reviewing records, counseling patient/family and coordinating care.   Sch Meds:  Scheduled Meds:  aspirin EC  81 mg Oral Daily   atorvastatin  80  mg Oral Daily   clonazePAM  0.5 mg Oral QHS   donepezil  5 mg Oral QHS   enoxaparin (LOVENOX) injection  40 mg Subcutaneous Daily   ezetimibe  10 mg Oral Daily   melatonin  3 mg Oral QHS   methimazole  5 mg Oral BID   metoprolol succinate  25 mg Oral Daily   mirtazapine  7.5 mg Oral QHS   QUEtiapine  25 mg Oral QHS   Continuous Infusions: PRN Meds:.acetaminophen, hydrALAZINE, polyethylene glycol, prochlorperazine  Antimicrobials: Anti-infectives (From admission, onward)    None        I have personally reviewed the following labs and images: CBC: Recent Labs  Lab 08/15/23 0104 08/17/23 0516  WBC 5.2 6.2  NEUTROABS 3.3  --   HGB 13.2 13.4  HCT 41.8 42.7  MCV 89.3 89.7  PLT 229 302   BMP &GFR Recent Labs  Lab 08/15/23 0104 08/15/23 0409 08/15/23 1414 08/17/23 0516  NA 139  --  138 138  K 3.0*  --  3.7 3.5  CL 103  --  102 101  CO2 27  --  25 27  GLUCOSE 100*  --  102* 103*  BUN 10  --  7* 8  CREATININE 0.92  --  0.80 0.82  CALCIUM 9.1  --  8.8* 9.5  MG  --  2.2  --  2.1  PHOS  --   --   --  3.4   Estimated Creatinine Clearance: 45.5 mL/min (by C-G formula based on SCr of 0.82 mg/dL). Liver & Pancreas: Recent Labs  Lab 08/17/23 0516  ALBUMIN 3.2*   No results for input(s): "LIPASE", "AMYLASE" in the last 168 hours. No results for input(s): "AMMONIA" in the last 168 hours. Diabetic: No results for input(s): "HGBA1C" in the last 72 hours. No results for input(s): "GLUCAP" in the last 168 hours. Cardiac Enzymes: No results for input(s): "CKTOTAL", "CKMB", "CKMBINDEX", "TROPONINI" in the last 168 hours. No results for input(s): "PROBNP" in the last 8760 hours. Coagulation Profile: No results for input(s): "INR", "PROTIME" in the last 168 hours. Thyroid Function Tests: Recent Labs    08/15/23 1414  TSH 0.794  FREET4 1.18*   Lipid Profile: No results for input(s): "CHOL", "HDL", "LDLCALC", "TRIG", "CHOLHDL", "LDLDIRECT" in the last 72  hours. Anemia Panel: No results for input(s): "VITAMINB12", "FOLATE", "FERRITIN", "TIBC", "IRON", "RETICCTPCT" in the last 72 hours. Urine analysis:    Component Value Date/Time   COLORURINE YELLOW (A) 08/15/2023 0122   APPEARANCEUR CLEAR (A) 08/15/2023 0122   LABSPEC 1.014 08/15/2023 0122   PHURINE 5.0 08/15/2023 0122   GLUCOSEU NEGATIVE 08/15/2023 0122   HGBUR NEGATIVE 08/15/2023 0122   BILIRUBINUR NEGATIVE 08/15/2023 0122   KETONESUR NEGATIVE 08/15/2023 0122   PROTEINUR NEGATIVE 08/15/2023 0122   UROBILINOGEN 0.2 03/09/2015 2216   NITRITE NEGATIVE 08/15/2023 0122   LEUKOCYTESUR NEGATIVE 08/15/2023 0122   Sepsis Labs: Invalid input(s): "PROCALCITONIN", "LACTICIDVEN"  Microbiology: No results found for this or any previous visit (from the past 240 hour(s)).  Radiology Studies: No results found.    Jozelynn Danielson T.  Chesley Veasey [facility]  If 7PM-7AM, please contact night-coverage [email or web address] 08/17/2023, 11:59 AM

## 2023-08-17 NOTE — TOC Initial Note (Signed)
Transition of Care [facility]) - Initial/Assessment Note    Patient Details  Name: Kristine [name] MRN: [id number] Date of Birth: September 01, 1941  Transition of [facility] CM/SW Contact:    Larrie Kass, LCSW Phone Number: 08/17/2023, 4:01 PM  Clinical Narrative:                 CSW spoke to Star with [facility], she reports pt owes the facility 4000.   Expected Discharge Plan:  (TBD) Barriers to Discharge: Continued Medical Work up   Patient Goals and CMS Choice            Expected Discharge Plan and Services In-house Referral: Clinical Social Work     Living arrangements for the past 2 months: Single Family Home                                      Prior Living Arrangements/Services Living arrangements for the past 2 months: Single Family Home Lives with:: Self, Spouse              Current home services: Home PT, Home OT, DME, Home RN (HHSW)    Activities of Daily Living   ADL Screening (condition at time of admission) Patient's cognitive ability adequate to safely complete daily activities?: Yes  Permission Sought/Granted                  Emotional Assessment              Admission diagnosis:  Hypokalemia [E87.6] Altered mental status, unspecified altered mental status type [R41.82] AMS (altered mental status) [R41.82] Patient Active Problem List   Diagnosis Date Noted   AMS (altered mental status) 08/15/2023   Lewy body dementia without behavioral disturbance, psychotic disturbance, mood disturbance, or anxiety (HCC) 06/02/2022   Acute memory impairment 05/16/2022   Impaired functional mobility, balance, and endurance 05/16/2022   Chronic left-sided low back pain with left-sided sciatica 02/05/2022   Lumbar radiculopathy 02/05/2022   Chest pain 05/14/2021   Somniloquy 04/08/2021   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   Snoring 12/19/2020   Excessive daytime sleepiness 12/19/2020   Gait disturbance 12/19/2020   Pain due to  onychomycosis of toenails of both feet 05/25/2019   Porokeratosis 05/25/2019   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   Angioedema 05/31/2015   Fever 03/11/2015   Acute blood loss anemia 03/11/2015   Melena 03/10/2015   GI bleed 03/09/2015   Peripheral arterial disease (HCC) 03/01/2012   CAD (coronary artery disease) 01/20/2012   HTN (hypertension)    Hyperlipidemia    PVD (peripheral vascular disease) (HCC)    COPD (chronic obstructive pulmonary disease) (HCC)    Hyperthyroidism    OA (osteoarthritis of spine)    STEMI (ST elevation myocardial infarction) (HCC) 04/07/2010   PCP:  Renaye Rakers, MD Pharmacy:   [facility] [address], [place] - [address] [place] [address] Phone: [phone] Fax: [phone]  [facility][id number] [place], [place] - [address] AT [facility] [address]  [place] [address] Phone: [phone] Fax: [phone]  [facility], [place] - [address] [place] [address] Phone: [phone] Fax: [phone]  [facility] #[id number] - , [place] - [address] AT [facility] [address] [place] [address] Phone: [phone] Fax: [phone]     Social Determinants of Health (SDOH) Social History: SDOH Screenings   Depression (PHQ2-9): Low Risk  (03/20/2023)  Tobacco Use: Medium Risk (08/14/2023)   SDOH Interventions:     Readmission Risk Interventions     No data to display

## 2023-08-17 NOTE — Evaluation (Signed)
Clinical/Bedside Swallow Evaluation Patient Details  Name: Kristine [name] MRN: [id number] Date of Birth: [birth year]  Today's Date: 08/17/2023 Time: SLP Start Time (ACUTE ONLY): 1425 SLP Stop Time (ACUTE ONLY): 1445 SLP Time Calculation (min) (ACUTE ONLY): 20 min  Past Medical History:  Past Medical History:  Diagnosis Date   COPD (chronic obstructive pulmonary disease) (HCC)    Hard of hearing    History of anemia    History of blood transfusion    History of pneumonia    HTN (hypertension)    Hyperlipidemia    Hyperthyroidism    treated wtih radioactive iodine   OA (osteoarthritis of spine)    PVD (peripheral vascular disease) (HCC)    Lower extremity Dopplers  May 2revealed an ABI of 0.83 in the right posterior    Shortness of breath dyspnea    STEMI (ST elevation myocardial infarction) [facility]) May 2011   Promus DES in the proximal, mid and distal RCA in May 2011   Stroke [facility])    Urinary frequency    Urinary urgency    Past Surgical History:  Past Surgical History:  Procedure Laterality Date   ABDOMINAL HYSTERECTOMY     Had ovarian resection and required lysis of adhesions   CARDIAC CATHETERIZATION  04/07/2010   EF 60-70%   CORONARY STENTS     x3   ESOPHAGOGASTRODUODENOSCOPY N/A 03/10/2015   Procedure: ESOPHAGOGASTRODUODENOSCOPY (EGD);  Surgeon: Wandalee Ferdinand, MD;  Location[facility] ENDOSCOPY;  Service: Endoscopy;  Laterality: N/A;   OVARY SURGERY     TOTAL HIP ARTHROPLASTY  2004   left   TOTAL HIP ARTHROPLASTY Right 01/23/2016   Procedure: RIGHT TOTAL HIP ARTHROPLASTY ANTERIOR APPROACH;  Surgeon: Loreta Ave, MD;  Location: [facility] OR;  Service: Orthopedics;  Laterality: Right;   HPI:  Pt is an 82 y.o. female who presented on 08/15/23 for AMS and failure to thrive. Recent d/c from [facility]. PMH signiifcant for Lewy Body Dementia, COPD, diastolic CHF, hyperthyroidism, and HTN.    Assessment / Plan / Recommendation  Clinical Impression  Pt was seen by SLP for bedside swallow  assessment. No s/sx of aspiration noted with consistencies administered (thin and puree). Per RN, pt has severely prolonged mastication of solids and required cues to swallow when eating. Pt was offered and prompted to attempt solid food, but was not willing to attempt. Prolonged bolus manipulation noted with puree, but swallow was otherwise functional, requiring no cues. Oral cavity was checked and bolus clearance was confirmed. Reccommnedation of Dys 1 (puree) and thin liquids. SLP to f/u to ensure diet toleration. SLP Visit Diagnosis: Dysphagia, unspecified (R13.10)    Aspiration Risk  Moderate aspiration risk    Diet Recommendation Dysphagia 1 (Puree);Thin liquid    Liquid Administration via: Cup;Straw Medication Administration: Crushed with puree Supervision: Full supervision/cueing for compensatory strategies Compensations: Minimize environmental distractions;Slow rate;Small sips/bites Postural Changes: Seated upright at 90 degrees    Other  Recommendations Oral Care Recommendations: Oral care BID    Recommendations for follow up therapy are one component of a multi-disciplinary discharge planning process, led by the attending physician.  Recommendations may be updated based on patient status, additional functional criteria and insurance authorization.  Follow up Recommendations Other (comment) (TBD at next venue of care)      Assistance Recommended at Discharge    Functional Status Assessment Patient has had a recent decline in their functional status and/or demonstrates limited ability to make significant improvements in function in a reasonable and predictable amount  of time  Frequency and Duration min 1 x/week  1 week       Prognosis Prognosis for improved oropharyngeal function: Guarded Barriers to Reach Goals: Cognitive deficits;Severity of deficits      Swallow Study   General Date of Onset: 08/15/23 HPI: Pt is an 82 y.o. female who presented on 08/15/23 for AMS and  failure to thrive. Recent d/c from [facility]. PMH signiifcant for Lewy Body Dementia, COPD, diastolic CHF, hyperthyroidism, and HTN. Type of Study: Bedside Swallow Evaluation Diet Prior to this Study: Regular;Thin liquids (Level 0) Temperature Spikes Noted: No Respiratory Status: Room air History of Recent Intubation: No Behavior/Cognition: Alert;Cooperative;Requires cueing Oral Cavity Assessment: Within Functional Limits Oral Care Completed by SLP: No Oral Cavity - Dentition: Edentulous Self-Feeding Abilities: Total assist;Needs assist Patient Positioning: Upright in bed Baseline Vocal Quality: Normal Volitional Swallow: Able to elicit    Oral/Motor/Sensory Function Overall Oral Motor/Sensory Function: Within functional limits   Ice Chips Ice chips: Not tested   Thin Liquid Thin Liquid: Within functional limits Presentation: Straw    Nectar Thick Nectar Thick Liquid: Not tested   Honey Thick Honey Thick Liquid: Not tested   Puree Puree: Within functional limits Presentation: Spoon   Solid     Solid: Not tested      Kristine [name], B.S., Speech Therapy Student

## 2023-08-17 NOTE — Plan of Care (Signed)
  Problem: Clinical Measurements: Goal: Ability to maintain clinical measurements within normal limits will improve Outcome: Progressing   Problem: Safety: Goal: Ability to remain free from injury will improve Outcome: Progressing   Problem: Skin Integrity: Goal: Risk for impaired skin integrity will decrease Outcome: Progressing   Problem: Education: Goal: Knowledge of General Education information will improve Description: Including pain rating scale, medication(s)/side effects and non-pharmacologic comfort measures Outcome: Not Progressing   Problem: Health Behavior/Discharge Planning: Goal: Ability to manage health-related needs will improve Outcome: Not Progressing   Problem: Nutrition: Goal: Adequate nutrition will be maintained Outcome: Not Progressing Note: Aspiration risk. Diet has been changed to soft for pt safety.

## 2023-08-18 DIAGNOSIS — Z7189 Other specified counseling: Secondary | ICD-10-CM | POA: Diagnosis not present

## 2023-08-18 DIAGNOSIS — R4182 Altered mental status, unspecified: Secondary | ICD-10-CM | POA: Diagnosis not present

## 2023-08-18 DIAGNOSIS — G3183 Dementia with Lewy bodies: Secondary | ICD-10-CM | POA: Diagnosis not present

## 2023-08-18 DIAGNOSIS — Z515 Encounter for palliative care: Secondary | ICD-10-CM | POA: Diagnosis not present

## 2023-08-18 DIAGNOSIS — F02C0 Dementia in other diseases classified elsewhere, severe, without behavioral disturbance, psychotic disturbance, mood disturbance, and anxiety: Secondary | ICD-10-CM

## 2023-08-18 DIAGNOSIS — F02C18 Dementia in other diseases classified elsewhere, severe, with other behavioral disturbance: Secondary | ICD-10-CM | POA: Diagnosis not present

## 2023-08-18 MED ORDER — AMLODIPINE BESYLATE 5 MG PO TABS
5.0000 mg | ORAL_TABLET | Freq: Every day | ORAL | Status: DC
  Administered 2023-08-18 – 2023-08-19 (×2): 5 mg via ORAL
  Filled 2023-08-18 (×2): qty 1

## 2023-08-18 MED ORDER — HYDRALAZINE HCL 25 MG PO TABS: 25.0000 mg | ORAL_TABLET | Freq: Four times a day (QID) | ORAL | Status: DC | PRN

## 2023-08-18 MED ORDER — SENNA 8.6 MG PO TABS
1.0000 | ORAL_TABLET | Freq: Every day | ORAL | Status: DC
  Administered 2023-08-18 – 2023-08-21 (×4): 8.6 mg via ORAL
  Filled 2023-08-18 (×4): qty 1

## 2023-08-18 MED ORDER — METOPROLOL SUCCINATE ER 50 MG PO TB24
50.0000 mg | ORAL_TABLET | Freq: Every day | ORAL | Status: DC
  Administered 2023-08-18 – 2023-08-21 (×4): 50 mg via ORAL
  Filled 2023-08-18 (×4): qty 1

## 2023-08-18 NOTE — Progress Notes (Signed)
Palliative:  HPI: 82 y.o. female  with past medical history of Lewy body dementia, stroke, hypertension, hyperlipidemia, CAD, PVD, COPD, osteoarthritis admitted on 08/14/2023 with altered mental status.   Kristine [name] continues to be pleasantly confused. She is more tired this morning. Per records she has been eating ~25% of her meals. Now on dysphagia diet and tolerating better per RN. No BM since 9/8 - will add senokot to assist since she is eating and is less mobile.   I spent time speaking with daughter, Kristine [name]. Kristine [name] and her family would like to meet tomorrow 1000 am. We will discuss Kristine [name]'s dementia progression and plans from here. Kristine [name] shares some of their struggles with caring for Kristine [name] and obstacles from the healthcare system in getting her the care and the answers they need. Kristine [name] understands that her mother's time here with them is likely limited but she understands that this is going to be difficult for her father particularly to accept. Kristine [name] and her sister, Mickeal Needy, are also worried about their father's health and wellbeing through this journey. Kristine [name] expresses that she wants her mother to have good care and wants her time to be comfortable and maintain her health the best we can. She does express concern over high BP and I have noted that Dr. Alanda Slim has adjusted medications today to try and better control - I passed Kristine [name]'s concerns to Dr. Alanda Slim. Kristine [name] wants to make sure that we are controlling her mother's treatable conditions acknowledging that there is not much we can do to address the progression of her dementia.   All questions/concerns addressed. Therapeutic listening. Emotional support provided.   Exam: Alert, pleasantly confused. No distress. Still concern for pocketing/holding po intake. Breathing regular, unlabored. Abd soft. Moves all extremities. Thin, frail.   Plan: - Family meeting to discuss goals of care 08/19/23 1000 am.   35 min  Yong Channel,  NP Palliative Medicine Team Pager [phone] (Please see [email or web address] for schedule) Team Phone [phone]    Greater than 50%  of this time was spent counseling and coordinating care related to the above assessment and plan

## 2023-08-18 NOTE — Plan of Care (Signed)
  Problem: Clinical Measurements: Goal: Ability to maintain clinical measurements within normal limits will improve Outcome: Progressing Goal: Will remain free from infection Outcome: Progressing   Problem: Safety: Goal: Ability to remain free from injury will improve Outcome: Progressing   Problem: Skin Integrity: Goal: Risk for impaired skin integrity will decrease Outcome: Progressing   Problem: Education: Goal: Knowledge of General Education information will improve Description: Including pain rating scale, medication(s)/side effects and non-pharmacologic comfort measures Outcome: Not Progressing   Problem: Health Behavior/Discharge Planning: Goal: Ability to manage health-related needs will improve Outcome: Not Progressing   Problem: Elimination: Goal: Will not experience complications related to bowel motility Outcome: Not Progressing Note: No BM in 2 days. Pt started on laxatives.

## 2023-08-18 NOTE — Progress Notes (Signed)
PROGRESS NOTE  Kristine [name] WNU:[id number] DOB: Aug 09, 1941   PCP: Renaye Rakers, MD  Patient is from: Home.  Brought by EMS.  DOA: 08/14/2023 LOS: 3  Chief complaints Chief Complaint  Patient presents with   Failure To Thrive     Brief Narrative / Interim history: 82 year old F with PMH of Lewy body dementia, COPD, diastolic CHF, hyperthyroidism and HTN brought to ED by EMS due to "failure to thrive".  Per family, patient was recently released from [facility] has been sleeping a lot more than normal and hard to engage.  In ED, vital stable.  Labs showed hypokalemia.  CT head without acute finding other than generalized cerebral atrophy.  CXR without acute finding.  Patient was admitted for altered mental status and failure to thrive.   No reversible etiology of altered mental status.  Likely confusional state/delirium in the setting of Lewy body dementia.  Palliative to meet with family for goal of care discussion.     Subjective: Seen and examined earlier this morning.  No major events overnight of this morning.  No complaints but not a reliable historian.  She is only oriented to self.  Does not appear to be in distress.  Objective: Vitals:   08/17/23 1934 08/17/23 2053 08/18/23 0452 08/18/23 0717  BP: (!) 194/102 (!) 172/101 (!) 179/120   Pulse: 84  90   Resp: 18  17   Temp: 98.2 F (36.8 C)  98.9 F (37.2 C)   TempSrc:      SpO2: 96%  99%   Weight:    109.4 kg    Examination:  GENERAL: No apparent distress.  Nontoxic. HEENT: MMM.  Vision and hearing grossly intact.  NECK: Supple.  No apparent JVD.  RESP:  No IWOB.  Fair aeration bilaterally. CVS:  RRR. Heart sounds normal.  ABD/GI/GU: BS+. Abd soft, NTND.  MSK/EXT: Difficult muscle mass and subcu fat loss.  Some degree of contractures in lower extremities. SKIN: no apparent skin lesion or wound NEURO: Awake and alert.  Oriented only to self.  No apparent focal neuro deficit but seems to have contractures in  lower extremities.Marland Kitchen PSYCH: Calm.  No distress or agitation.  Procedures:  None  Microbiology summarized: None  Assessment and plan: Principal Problem:   AMS (altered mental status)  Confusional state/delirium in patient with Lewy body dementia: Dementia seems to be advanced.  Awake and alert but only oriented to self and "hospital".  Follows some commands.  CT head without acute finding but consistent with dementia.  Free T4 slightly elevated but TSH normal.  Low suspicion for infection.  UA negative. -Reorientation and delirium precaution -Minimize or avoid sedating medications -Continue decreased dose of Klonopin to 0.5 mg at night -Continue low-dose Seroquel at night. -Continue home Aricept. -IV Haldol as needed agitation   Coronary artery disease: Stable -Resume aspirin, statin, Zetia and metoprolol.   Essential hypertension: BP elevated -Increase Toprol-XL to 50 mg daily -Add amlodipine -P.o. hydralazine as needed -Avoid HCTZ due to risk of dehydration and AKI   Chronic diastolic heart failure: Appears euvolemic.  Not on diuretics other than HCTZ -Hold home HCTZ  Hyperthyroidism: Seems to be on methimazole.  Free T4 slightly elevated but improved.  TSH normal. -Continue home methimazole   Hypokalemia: Resolved.   Chronic anxiety: Stable. -Reduced dose of Klonopin as above   Ambulatory dysfunction: -PT/OT-recommended SNF  Failure to thrive: Likely due to dementia. Body mass index is 41.4 kg/m. -Palliative medicine following.  DVT prophylaxis:  enoxaparin (LOVENOX) injection 40 mg Start: 08/15/23 1000  Code Status: Full code. Family Communication: None at bedside Level of care: Telemetry Status is: Inpatient Remains inpatient appropriate because: Disposition/placement   Final disposition: SNF? Consultants:  Palliative medicine  35 minutes with more than 50% spent in reviewing records, counseling patient/family and coordinating care.   Sch  Meds:  Scheduled Meds:  amLODipine  5 mg Oral Daily   aspirin EC  81 mg Oral Daily   atorvastatin  80 mg Oral Daily   clonazePAM  0.5 mg Oral QHS   donepezil  5 mg Oral QHS   enoxaparin (LOVENOX) injection  40 mg Subcutaneous Daily   ezetimibe  10 mg Oral Daily   melatonin  3 mg Oral QHS   methimazole  5 mg Oral BID   metoprolol succinate  50 mg Oral Daily   mirtazapine  7.5 mg Oral QHS   QUEtiapine  25 mg Oral QHS   Continuous Infusions: PRN Meds:.acetaminophen, hydrALAZINE, polyethylene glycol, prochlorperazine  Antimicrobials: Anti-infectives (From admission, onward)    None        I have personally reviewed the following labs and images: CBC: Recent Labs  Lab 08/15/23 0104 08/17/23 0516  WBC 5.2 6.2  NEUTROABS 3.3  --   HGB 13.2 13.4  HCT 41.8 42.7  MCV 89.3 89.7  PLT 229 302   BMP &GFR Recent Labs  Lab 08/15/23 0104 08/15/23 0409 08/15/23 1414 08/17/23 0516  NA 139  --  138 138  K 3.0*  --  3.7 3.5  CL 103  --  102 101  CO2 27  --  25 27  GLUCOSE 100*  --  102* 103*  BUN 10  --  7* 8  CREATININE 0.92  --  0.80 0.82  CALCIUM 9.1  --  8.8* 9.5  MG  --  2.2  --  2.1  PHOS  --   --   --  3.4   Estimated Creatinine Clearance: 66.2 mL/min (by C-G formula based on SCr of 0.82 mg/dL). Liver & Pancreas: Recent Labs  Lab 08/17/23 0516  ALBUMIN 3.2*   No results for input(s): "LIPASE", "AMYLASE" in the last 168 hours. No results for input(s): "AMMONIA" in the last 168 hours. Diabetic: No results for input(s): "HGBA1C" in the last 72 hours. No results for input(s): "GLUCAP" in the last 168 hours. Cardiac Enzymes: No results for input(s): "CKTOTAL", "CKMB", "CKMBINDEX", "TROPONINI" in the last 168 hours. No results for input(s): "PROBNP" in the last 8760 hours. Coagulation Profile: No results for input(s): "INR", "PROTIME" in the last 168 hours. Thyroid Function Tests: Recent Labs    08/15/23 1414  TSH 0.794  FREET4 1.18*   Lipid Profile: No  results for input(s): "CHOL", "HDL", "LDLCALC", "TRIG", "CHOLHDL", "LDLDIRECT" in the last 72 hours. Anemia Panel: No results for input(s): "VITAMINB12", "FOLATE", "FERRITIN", "TIBC", "IRON", "RETICCTPCT" in the last 72 hours. Urine analysis:    Component Value Date/Time   COLORURINE YELLOW (A) 08/15/2023 0122   APPEARANCEUR CLEAR (A) 08/15/2023 0122   LABSPEC 1.014 08/15/2023 0122   PHURINE 5.0 08/15/2023 0122   GLUCOSEU NEGATIVE 08/15/2023 0122   HGBUR NEGATIVE 08/15/2023 0122   BILIRUBINUR NEGATIVE 08/15/2023 0122   KETONESUR NEGATIVE 08/15/2023 0122   PROTEINUR NEGATIVE 08/15/2023 0122   UROBILINOGEN 0.2 03/09/2015 2216   NITRITE NEGATIVE 08/15/2023 0122   LEUKOCYTESUR NEGATIVE 08/15/2023 0122   Sepsis Labs: Invalid input(s): "PROCALCITONIN", "LACTICIDVEN"  Microbiology: No results found for this or any  previous visit (from the past 240 hour(s)).  Radiology Studies: No results found.    Yomaris Palecek T. Romero Letizia [facility]  If 7PM-7AM, please contact night-coverage [email or web address] 08/18/2023, 1:52 PM

## 2023-08-18 NOTE — Plan of Care (Signed)

## 2023-08-18 NOTE — TOC Progression Note (Signed)
Transition of Care [facility]) - Progression Note    Patient Details  Name: Kristine [name] MRN: [id number] Date of Birth: 1941/09/16  [facility] CM/SW Contact  Larrie Kass, LCSW Phone Number: 08/18/2023, 1:43 PM  Clinical Narrative:    CSW spoke with pt's daughter Kristine [name] she reports pt can not return home. She stated her father is unable to care for pt. She stated they would like pt to return to [facility], and is aware of pt's balance.  She stated that this CSW would need to speak with her sister Kristine [name], she is the one that handles the pt's finances.  CSW attempted to speak to Star with [facility], awaiting a return call. TOC to follow.   1:50pm CSW spoke with pt's daughter Kristine [name] , she stated she plans to pay the balance at [facility] tomorrow.  Pt's daughter state she was told, the family will have to reapply for Medicaid after they do a spend down. She stated she plans to meet with Palliative NP tomorrow at 10 am to discuss GOC. TOC to follow.   2:20 pm CSW spoke to Star with [employer], she reported her business office was not aware pt's family planned to pay balance. She stated they are still waiting for pt's daughter to return phone call. CSW informed facility pt and family is still discussing GOC, and will follow up with facility tomorrow. TOC to follow   Expected Discharge Plan:  (TBD)   Expected Discharge Plan and Services In-house Referral: Clinical Social Work     Living arrangements for the past 2 months: Single Family Home                                       Social Determinants of Health (SDOH) Interventions SDOH Screenings   Depression (PHQ2-9): Low Risk  (03/20/2023)  Tobacco Use: Medium Risk (08/14/2023)    Readmission Risk Interventions     No data to display

## 2023-08-19 DIAGNOSIS — G3183 Dementia with Lewy bodies: Secondary | ICD-10-CM | POA: Diagnosis not present

## 2023-08-19 DIAGNOSIS — R4182 Altered mental status, unspecified: Secondary | ICD-10-CM | POA: Diagnosis not present

## 2023-08-19 DIAGNOSIS — F02C18 Dementia in other diseases classified elsewhere, severe, with other behavioral disturbance: Secondary | ICD-10-CM | POA: Diagnosis not present

## 2023-08-19 DIAGNOSIS — Z7189 Other specified counseling: Secondary | ICD-10-CM | POA: Diagnosis not present

## 2023-08-19 DIAGNOSIS — Z66 Do not resuscitate: Secondary | ICD-10-CM

## 2023-08-19 DIAGNOSIS — Z515 Encounter for palliative care: Secondary | ICD-10-CM | POA: Diagnosis not present

## 2023-08-19 MED ORDER — PANTOPRAZOLE SODIUM 40 MG IV SOLR
40.0000 mg | INTRAVENOUS | Status: DC
  Administered 2023-08-19 – 2023-08-20 (×2): 40 mg via INTRAVENOUS
  Filled 2023-08-19 (×2): qty 10

## 2023-08-19 MED ORDER — AMLODIPINE BESYLATE 10 MG PO TABS
10.0000 mg | ORAL_TABLET | Freq: Every day | ORAL | Status: DC
  Administered 2023-08-20 – 2023-08-21 (×2): 10 mg via ORAL
  Filled 2023-08-19 (×2): qty 1

## 2023-08-19 MED ORDER — AMLODIPINE BESYLATE 5 MG PO TABS
5.0000 mg | ORAL_TABLET | Freq: Once | ORAL | Status: AC
  Administered 2023-08-19: 5 mg via ORAL
  Filled 2023-08-19: qty 1

## 2023-08-19 MED ORDER — PANTOPRAZOLE SODIUM 40 MG PO TBEC: 40.0000 mg | DELAYED_RELEASE_TABLET | Freq: Every day | ORAL | Status: DC

## 2023-08-19 NOTE — Progress Notes (Signed)
Palliative:  HPI: 82 y.o. female  with past medical history of Lewy body dementia, stroke, hypertension, hyperlipidemia, CAD, PVD, COPD, osteoarthritis admitted on 08/14/2023 with altered mental status.   I met today with Kristine [name]'s husband and 2 daughters. We addressed her hypertension and chest pain and EKG ordered. I discussed further with family our work up and lack of reversible etiology of decline. We discussed progression of dementia and anticipation of what is to come. We discussed overall poor prognosis. We discussed plans for LTC after rehab and Mickeal Needy is in touch with someone from [facility] assisting them with spending down money to be approved for LTC Medicaid. We reviewed the possibility and benefits from hospice support in the future. We discussed decisions to be made and all present agree with DNR status. We reviewed MOST form and recommendation against artificial feeding especially in dementia. MOST not completed and family will continue to review and discuss. EKG with no concerns. Will add PPI to assist with reflex which may assist with her chest pain.   All questions/concerns addressed. Emotional support provided.   Exam: Alert, pleasantly confused. Grimace - when I ask if she has pain and to show where she hurts she puts hand to chest. Still concern for pocketing/holding po intake. Breathing regular, unlabored. Abd soft. Moves all extremities. Thin, frail.   Plan: - DNR decided - Family reviewing MOST - Hopeful for [facility] for rehab with transition to LTC with Medicaid assistance  80 min  Yong Channel, NP Palliative Medicine Team Pager [phone] (Please see [email or web address] for schedule) Team Phone [phone]    Greater than 50%  of this time was spent counseling and coordinating care related to the above assessment and plan

## 2023-08-19 NOTE — Progress Notes (Signed)
PROGRESS NOTE  Kristine [name] UJW:[id number] DOB: 28-Oct-1941   PCP: Renaye Rakers, MD  Patient is from: Home.  Brought by EMS.  DOA: 08/14/2023 LOS: 4  Chief complaints Chief Complaint  Patient presents with   Failure To Thrive     Brief Narrative / Interim history: 82 year old F with PMH of Lewy body dementia, COPD, diastolic CHF, hyperthyroidism and HTN brought to ED by EMS due to "failure to thrive".  Per family, patient was recently released from [facility] has been sleeping a lot more than normal and hard to engage.  In ED, vital stable.  Labs showed hypokalemia.  CT head without acute finding other than generalized cerebral atrophy.  CXR without acute finding.  Patient was admitted for altered mental status and failure to thrive.   No reversible etiology of altered mental status.  Likely failure to thrive, confusional state/delirium in the setting of Lewy body dementia.  Palliative consulted and met with patient's family.  CODE STATUS changed to DNR/DNI.  Family interested in SNF placement with palliative follow-up.    Subjective: Seen and examined earlier this morning.  No major events overnight of this morning.  Patient is confused not able to answer orientation questions.  Does not appear to be in distress.  Objective: Vitals:   08/18/23 1928 08/19/23 0359 08/19/23 0400 08/19/23 1252  BP: 126/79 (!) 153/95  (!) 157/92  Pulse: 80 79  81  Resp: 18 18  18   Temp: 98.3 F (36.8 C) 98 F (36.7 C)    TempSrc: Oral     SpO2: 99% 99%  99%  Weight:   109.1 kg     Examination:  GENERAL: No apparent distress.  Nontoxic. HEENT: MMM.  Vision and hearing grossly intact.  NECK: Supple.  No apparent JVD.  RESP:  No IWOB.  Fair aeration bilaterally. CVS:  RRR. Heart sounds normal.  ABD/GI/GU: BS+. Abd soft, NTND.  MSK/EXT: Difficult muscle mass and subcu fat loss.  Some degree of contractures in lower extremities. SKIN: no apparent skin lesion or wound NEURO: Awake and alert  but totally disoriented.  No apparent focal neuro deficit but seems to have contractures in lower extremities.Marland Kitchen PSYCH: Calm.  No distress or agitation.  Procedures:  None  Microbiology summarized: None  Assessment and plan: Principal Problem:   AMS (altered mental status)  Confusional state/delirium in patient with Lewy body dementia: Dementia seems to be advanced.  Awake and alert but only oriented to self and "hospital".  Follows some commands.  CT head without acute finding but consistent with dementia.  Free T4 slightly elevated but TSH normal.  Low suspicion for infection.  UA negative. -Reorientation and delirium precaution -Minimize or avoid sedating medications -Continue decreased dose of Klonopin to 0.5 mg at night -Continue low-dose Seroquel at night. -Continue home Aricept. -IV Haldol as needed agitation   Coronary artery disease: Stable -Resume aspirin, statin, Zetia and metoprolol.   Essential hypertension: BP elevated -Increased Toprol-XL to 50 mg daily on 9/10 -Increase amlodipine to 10 mg daily -P.o. hydralazine as needed -Avoid HCTZ due to risk of dehydration and AKI   Chronic diastolic heart failure: Appears euvolemic.  Not on diuretics other than HCTZ -Hold home HCTZ  Hyperthyroidism: Seems to be on methimazole.  Free T4 slightly elevated but improved.  TSH normal. -Continue home methimazole   Hypokalemia: Resolved.   Chronic anxiety: Stable. -Reduced dose of Klonopin as above   Ambulatory dysfunction: -PT/OT-recommended SNF  Failure to thrive: Likely due to dementia.  Body mass index is 41.29 kg/m. -CODE STATUS changed to DNR/DNI after meeting with palliative medicine      DVT prophylaxis:  enoxaparin (LOVENOX) injection 40 mg Start: 08/15/23 1000  Code Status: DNR/DNI Family Communication: None at bedside Level of care: Med-Surg Status is: Inpatient Remains inpatient appropriate because: Disposition/placement   Final disposition: SNF  with palliative follow-up Consultants:  Palliative medicine  35 minutes with more than 50% spent in reviewing records, counseling patient/family and coordinating care.   Sch Meds:  Scheduled Meds:  [START ON 08/20/2023] amLODipine  10 mg Oral Daily   amLODipine  5 mg Oral Once   aspirin EC  81 mg Oral Daily   atorvastatin  80 mg Oral Daily   clonazePAM  0.5 mg Oral QHS   donepezil  5 mg Oral QHS   enoxaparin (LOVENOX) injection  40 mg Subcutaneous Daily   ezetimibe  10 mg Oral Daily   melatonin  3 mg Oral QHS   methimazole  5 mg Oral BID   metoprolol succinate  50 mg Oral Daily   mirtazapine  7.5 mg Oral QHS   pantoprazole (PROTONIX) IV  40 mg Intravenous Q24H   QUEtiapine  25 mg Oral QHS   senna  1 tablet Oral Daily   Continuous Infusions: PRN Meds:.acetaminophen, hydrALAZINE, polyethylene glycol, prochlorperazine  Antimicrobials: Anti-infectives (From admission, onward)    None        I have personally reviewed the following labs and images: CBC: Recent Labs  Lab 08/15/23 0104 08/17/23 0516  WBC 5.2 6.2  NEUTROABS 3.3  --   HGB 13.2 13.4  HCT 41.8 42.7  MCV 89.3 89.7  PLT 229 302   BMP &GFR Recent Labs  Lab 08/15/23 0104 08/15/23 0409 08/15/23 1414 08/17/23 0516  NA 139  --  138 138  K 3.0*  --  3.7 3.5  CL 103  --  102 101  CO2 27  --  25 27  GLUCOSE 100*  --  102* 103*  BUN 10  --  7* 8  CREATININE 0.92  --  0.80 0.82  CALCIUM 9.1  --  8.8* 9.5  MG  --  2.2  --  2.1  PHOS  --   --   --  3.4   Estimated Creatinine Clearance: 66.1 mL/min (by C-G formula based on SCr of 0.82 mg/dL). Liver & Pancreas: Recent Labs  Lab 08/17/23 0516  ALBUMIN 3.2*   No results for input(s): "LIPASE", "AMYLASE" in the last 168 hours. No results for input(s): "AMMONIA" in the last 168 hours. Diabetic: No results for input(s): "HGBA1C" in the last 72 hours. No results for input(s): "GLUCAP" in the last 168 hours. Cardiac Enzymes: No results for input(s):  "CKTOTAL", "CKMB", "CKMBINDEX", "TROPONINI" in the last 168 hours. No results for input(s): "PROBNP" in the last 8760 hours. Coagulation Profile: No results for input(s): "INR", "PROTIME" in the last 168 hours. Thyroid Function Tests: No results for input(s): "TSH", "T4TOTAL", "FREET4", "T3FREE", "THYROIDAB" in the last 72 hours.  Lipid Profile: No results for input(s): "CHOL", "HDL", "LDLCALC", "TRIG", "CHOLHDL", "LDLDIRECT" in the last 72 hours. Anemia Panel: No results for input(s): "VITAMINB12", "FOLATE", "FERRITIN", "TIBC", "IRON", "RETICCTPCT" in the last 72 hours. Urine analysis:    Component Value Date/Time   COLORURINE YELLOW (A) 08/15/2023 0122   APPEARANCEUR CLEAR (A) 08/15/2023 0122   LABSPEC 1.014 08/15/2023 0122   PHURINE 5.0 08/15/2023 0122   GLUCOSEU NEGATIVE 08/15/2023 0122   HGBUR NEGATIVE 08/15/2023 0122  BILIRUBINUR NEGATIVE 08/15/2023 0122   KETONESUR NEGATIVE 08/15/2023 0122   PROTEINUR NEGATIVE 08/15/2023 0122   UROBILINOGEN 0.2 03/09/2015 2216   NITRITE NEGATIVE 08/15/2023 0122   LEUKOCYTESUR NEGATIVE 08/15/2023 0122   Sepsis Labs: Invalid input(s): "PROCALCITONIN", "LACTICIDVEN"  Microbiology: No results found for this or any previous visit (from the past 240 hour(s)).  Radiology Studies: No results found.    Jelani Trueba T. Suann Klier [facility]  If 7PM-7AM, please contact night-coverage [email or web address] 08/19/2023, 2:41 PM

## 2023-08-19 NOTE — Progress Notes (Signed)
Physical Therapy Treatment Patient Details Name: Kristine [name] MRN: [id number] DOB: [birth year] Today's Date: 08/19/2023   History of Present Illness Pt is an 82yo female presenting to [facility] ED on 9/6 from home (recently discharged from [facility]) for AMS and unable to ambulate indepdnently. CT negative for acute findings.  PMH: Lewy-Body dementia, HTN, HLD, CAD, COPD, OA, PVD, hx of STEMI 2011, hx of stroke, b/l THA.    PT Comments  Pt minimally engaging in therapy session, able to follow commands with increased time and cues, responds to questions with 1-2 words, no facial wincing noted with mobility. Pt needing max A to come to sitting EOB, reaches with BUE and minimally inches BLE towards EOB. Pt needing max A to lift BLE back into bed and reposition trunk; total A with bedpad to scoot up in bed. Attempted STS at EOB but pt with tremulous movements in UE and posterior lean, unable to power up with max A+1. Pt needing min A to SBA with sitting EOB, reliant on BUE to support while statically sitting. Attempted to cue pt through supine exercises but pt appears fatigued, minimally moving BLE so further activity deferred.   If plan is discharge home, recommend the following: Two people to help with walking and/or transfers;Assistance with cooking/housework;Assistance with feeding;Assist for transportation;Help with stairs or ramp for entrance;Supervision due to cognitive status   Can travel by private vehicle     No  Equipment Recommendations  None recommended by PT (defer to next venue)    Recommendations for Other Services       Precautions / Restrictions Precautions Precautions: Fall Restrictions Weight Bearing Restrictions: No     Mobility  Bed Mobility Overal bed mobility: Needs Assistance Bed Mobility: Supine to Sit, Sit to Supine     Supine to sit: HOB elevated, Max assist Sit to supine: Max assist   General bed mobility comments: max A for supine to sit, pt reaching  minimally with UE to assist with transfer, therapist using bedpad, tremulous UE movements with initiation; total A to scoot up in bed with bedpad    Transfers                   General transfer comment: attempted STS from EOB x3 attempts but unsuccessful, pt also unable to laterally scoot with +1 assist, pt with strong posterior lean and increased tremors in extremities with mobility    Ambulation/Gait                   Stairs             Wheelchair Mobility     Tilt Bed    Modified Rankin (Stroke Patients Only)       Balance Overall balance assessment: Needs assistance Sitting-balance support: Feet supported, Bilateral upper extremity supported Sitting balance-Leahy Scale: Poor Sitting balance - Comments: min A to CGA, needing UE support for static sitting; posterior lean with initiating movements                                    Cognition Arousal: Lethargic Behavior During Therapy: Flat affect Overall Cognitive Status: No family/caregiver present to determine baseline cognitive functioning                                 General Comments: pt opens eyes to loud voice,  1-2 word responses, needing redirection to task        Exercises      General Comments        Pertinent Vitals/Pain Pain Assessment Pain Assessment: Faces Faces Pain Scale: No hurt    Home Living                          Prior Function            PT Goals (current goals can now be found in the care plan section) Progress towards PT goals: Progressing toward goals    Frequency    Min 1X/week      PT Plan      Co-evaluation              AM-PAC PT "6 Clicks" Mobility   Outcome Measure  Help needed turning from your back to your side while in a flat bed without using bedrails?: A Lot Help needed moving from lying on your back to sitting on the side of a flat bed without using bedrails?: A Lot Help needed  moving to and from a bed to a chair (including a wheelchair)?: Total Help needed standing up from a chair using your arms (e.g., wheelchair or bedside chair)?: Total Help needed to walk in hospital room?: Total Help needed climbing 3-5 steps with a railing? : Total 6 Click Score: 8    End of Session Equipment Utilized During Treatment: Gait belt Activity Tolerance: Patient tolerated treatment well;Patient limited by fatigue Patient left: in bed;with call bell/phone within reach;with bed alarm set Nurse Communication: Mobility status PT Visit Diagnosis: Other abnormalities of gait and mobility (R26.89)     Time: 1349-1410 PT Time Calculation (min) (ACUTE ONLY): 21 min  Charges:    $Therapeutic Activity: 8-22 mins PT General Charges $$ ACUTE PT VISIT: 1 Visit                     Tori Montserrat Shek PT, DPT 08/19/23, 2:26 PM

## 2023-08-19 NOTE — Plan of Care (Signed)

## 2023-08-19 NOTE — TOC Progression Note (Signed)
Transition of Care [facility]) - Progression Note    Patient Details  Name: Kristine [name] MRN: [id number] Date of Birth: July 30, 1941  Transition of Care [facility]) CM/SW Contact  Larrie Kass, LCSW Phone Number: 08/19/2023, 3:55 PM  Clinical Narrative:     CSW received a message from palliative NP , she stated the family plans to pay off facility today. CSW awaiting to her from facility about SNF placement. TOC to follow.    Expected Discharge Plan:  (TBD) Barriers to Discharge: Continued Medical Work up  Expected Discharge Plan and Services In-house Referral: Clinical Social Work     Living arrangements for the past 2 months: Single Family Home                                       Social Determinants of Health (SDOH) Interventions SDOH Screenings   Depression (PHQ2-9): Low Risk  (03/20/2023)  Tobacco Use: Medium Risk (08/14/2023)    Readmission Risk Interventions     No data to display

## 2023-08-19 NOTE — [place] FL2 (Signed)
[place] MEDICAID FL2 LEVEL OF CARE FORM     IDENTIFICATION  Patient Name: Kristine [name] Birthdate: [birth year] Sex: female Admission Date (Current Location): 08/14/2023  [facility] and [place] Number:  Producer, television/film/video and Address:  [facility],  [address], [place] [address]      Provider Number: [id number]  Attending Physician Name and Address:  Almon Hercules, MD  Relative Name and Phone Number:  Ellason, Doucett. (Daughter)  [phone] (Home Phone)    Current Level of Care: Hospital Recommended Level of Care: Skilled Nursing Facility Prior Approval Number:    Date Approved/Denied:   PASRR Number: [id number]  Discharge Plan: SNF    Current Diagnoses: Patient Active Problem List   Diagnosis Date Noted   AMS (altered mental status) 08/15/2023   Lewy body dementia without behavioral disturbance, psychotic disturbance, mood disturbance, or anxiety (HCC) 06/02/2022   Acute memory impairment 05/16/2022   Impaired functional mobility, balance, and endurance 05/16/2022   Chronic left-sided low back pain with left-sided sciatica 02/05/2022   Lumbar radiculopathy 02/05/2022   Chest pain 05/14/2021   Somniloquy 04/08/2021   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   Snoring 12/19/2020   Excessive daytime sleepiness 12/19/2020   Gait disturbance 12/19/2020   Pain due to onychomycosis of toenails of both feet 05/25/2019   Porokeratosis 05/25/2019   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   Angioedema 05/31/2015   Fever 03/11/2015   Acute blood loss anemia 03/11/2015   Melena 03/10/2015   GI bleed 03/09/2015   Peripheral arterial disease (HCC) 03/01/2012   CAD (coronary artery disease) 01/20/2012   HTN (hypertension)    Hyperlipidemia    PVD (peripheral vascular disease) (HCC)    COPD (chronic obstructive pulmonary disease) (HCC)    Hyperthyroidism    OA (osteoarthritis of spine)    STEMI (ST  elevation myocardial infarction) (HCC) 04/07/2010    Orientation RESPIRATION BLADDER Height & Weight     Self  Normal Incontinent, External catheter Weight: 240 lb 8.4 oz (109.1 kg) Height:     BEHAVIORAL SYMPTOMS/MOOD NEUROLOGICAL BOWEL NUTRITION STATUS      Incontinent Diet (dys1)  AMBULATORY STATUS COMMUNICATION OF NEEDS Skin   Extensive Assist Verbally Normal                       Personal Care Assistance Level of Assistance  Bathing, Feeding, Dressing Bathing Assistance: Limited assistance Feeding assistance: Limited assistance Dressing Assistance: Limited assistance     Functional Limitations Info  Sight, Hearing, Speech Sight Info: Impaired (Glasses) Hearing Info: Adequate Speech Info: Adequate    SPECIAL CARE FACTORS FREQUENCY  PT (By licensed PT), OT (By licensed OT)     PT Frequency: 5x a week OT Frequency: 5x a week            Contractures Contractures Info: Not present    Additional Factors Info  Code Status, Allergies, Psychotropic Code Status Info: DNR Allergies Info: Ace Inhibitors  Iodinated Contrast Media  Other  Shellfish-derived Products  Hydrocodone Psychotropic Info: clonazePAM (KLONOPIN) tablet 0.5 mg, QUEtiapine (SEROQUEL) tablet 25 mg         Current Medications (08/19/2023):  This is the current hospital active medication list Current Facility-Administered Medications  Medication Dose Route Frequency Provider Last Rate Last Admin   acetaminophen (TYLENOL) tablet 650 mg  650 mg Oral Q6H PRN Darlin Drop, DO   650 mg at 08/17/23 2046  amLODipine (NORVASC) tablet 5 mg  5 mg Oral Daily Candelaria Stagers T, MD   5 mg at 08/19/23 0957   aspirin EC tablet 81 mg  81 mg Oral Daily Darlin Drop, DO   81 mg at 08/19/23 0956   atorvastatin (LIPITOR) tablet 80 mg  80 mg Oral Daily Darlin Drop, DO   80 mg at 08/19/23 1610   clonazePAM (KLONOPIN) tablet 0.5 mg  0.5 mg Oral QHS Dow Adolph N, DO   0.5 mg at 08/18/23 2108   donepezil (ARICEPT)  tablet 5 mg  5 mg Oral QHS Dow Adolph N, DO   5 mg at 08/18/23 2108   enoxaparin (LOVENOX) injection 40 mg  40 mg Subcutaneous Daily Dow Adolph N, DO   40 mg at 08/19/23 0956   ezetimibe (ZETIA) tablet 10 mg  10 mg Oral Daily Dow Adolph N, DO   10 mg at 08/19/23 9604   hydrALAZINE (APRESOLINE) tablet 25 mg  25 mg Oral Q6H PRN Almon Hercules, MD       melatonin tablet 3 mg  3 mg Oral QHS Marinda Elk, MD   3 mg at 08/18/23 2108   methimazole (TAPAZOLE) tablet 5 mg  5 mg Oral BID Dow Adolph N, DO   5 mg at 08/19/23 0956   metoprolol succinate (TOPROL-XL) 24 hr tablet 50 mg  50 mg Oral Daily Candelaria Stagers T, MD   50 mg at 08/19/23 0957   mirtazapine (REMERON) tablet 7.5 mg  7.5 mg Oral QHS Dow Adolph N, DO   7.5 mg at 08/18/23 2108   pantoprazole (PROTONIX) injection 40 mg  40 mg Intravenous Q24H Ulice Bold, NP       polyethylene glycol (MIRALAX / GLYCOLAX) packet 17 g  17 g Oral Daily PRN Dow Adolph N, DO       prochlorperazine (COMPAZINE) injection 5 mg  5 mg Intravenous Q6H PRN Hall, Carole N, DO       QUEtiapine (SEROQUEL) tablet 25 mg  25 mg Oral QHS Hall, Carole N, DO   25 mg at 08/18/23 2107   senna (SENOKOT) tablet 8.6 mg  1 tablet Oral Daily Ulice Bold, NP   8.6 mg at 08/19/23 5409     Discharge Medications: Please see discharge summary for a list of discharge medications.  Relevant Imaging Results:  Relevant Lab Results:   Additional Information SSN:[id number]  Valentina Shaggy Sheriece Jefcoat, LCSW

## 2023-08-20 DIAGNOSIS — R4182 Altered mental status, unspecified: Secondary | ICD-10-CM | POA: Diagnosis not present

## 2023-08-20 NOTE — Progress Notes (Signed)
Occupational Therapy Treatment Patient Details Name: Kristine [name] MRN: [id number] DOB: [birth year] Today's Date: 08/20/2023   History of present illness Pt is an 82yo female presenting to [facility] ED on 9/6 from home (recently discharged from [facility]) for AMS and unable to ambulate indepdnently. CT negative for acute findings.  PMH: Lewy-Body dementia, HTN, HLD, CAD, COPD, OA, PVD, hx of STEMI 2011, hx of stroke, b/l THA.   OT comments  Pt in bed upon therapy arrival. Session focused on direction following, bed mobility, and sitting balance/tolerance while participating in simple self care task. Pt with increased difficulty following 1 step commands. Oriented to self only. Able to tolerate sitting on EOB for ~15 minutes before pushing posteriorly and sitting became no longer safe. Pt required Total A for oral care and bed mobility. Recommend OT reassess next session.       If plan is discharge home, recommend the following:  Two people to help with walking and/or transfers;Two people to help with bathing/dressing/bathroom;Assistance with feeding;Help with stairs or ramp for entrance;Assist for transportation;Supervision due to cognitive status   Equipment Recommendations  Other (comment) (defer to next level of care)       Precautions / Restrictions Precautions Precautions: Fall Precaution Comments: lewy body dementia Restrictions Weight Bearing Restrictions: No       Mobility Bed Mobility Overal bed mobility: Needs Assistance Bed Mobility: Supine to Sit, Sit to Supine     Supine to sit: Total assist, HOB elevated, Used rails Sit to supine: Total assist   General bed mobility comments: Pt initially attempted to initiate task and follow directions. As soon as therapist attempted to physically assist pt, she became very rigid and resistive, scared of movement and mobility.    Transfers Overall transfer level: Needs assistance (deferred d/t decreased cognition and sitting  balance)        Balance Overall balance assessment: Needs assistance Sitting-balance support: Feet unsupported, No upper extremity supported Sitting balance-Leahy Scale: Poor Sitting balance - Comments: Able to maintain sitting balance with SBA for ~20 seconds because demonstrating a posterior bias and required physical assist to correct. Postural control: Posterior lean          ADL either performed or assessed with clinical judgement   ADL Overall ADL's : Needs assistance/impaired     Grooming: Total assistance;Sitting;Oral care                Cognition Arousal: Alert Behavior During Therapy: Flat affect Overall Cognitive Status: No family/caregiver present to determine baseline cognitive functioning        General Comments: Pt oriented to self only. Inconsistently followed 1 step commands and did not fully follow the entire step when she did. Decreased attention to task and awareness of environment. Pt required more visual cues versus verbal cues.                   Pertinent Vitals/ Pain       Pain Assessment Pain Assessment: Faces Faces Pain Scale: Hurts even more Pain Location: generalized with movement/mobility. Unable to verbalize where. Pain Descriptors / Indicators: Grimacing, Guarding Pain Intervention(s): Monitored during session, Repositioned, Limited activity within patient's tolerance         Frequency  Min 2X/week        Progress Toward Goals  OT Goals(current goals can now be found in the care plan section)  Progress towards OT goals: OT to reassess next treatment      AM-PAC OT "6 Clicks" Daily Activity  Outcome Measure   Help from another person eating meals?: Total Help from another person taking care of personal grooming?: Total Help from another person toileting, which includes using toliet, bedpan, or urinal?: Total Help from another person bathing (including washing, rinsing, drying)?: Total Help from another person to  put on and taking off regular upper body clothing?: Total Help from another person to put on and taking off regular lower body clothing?: Total 6 Click Score: 6    End of Session    OT Visit Diagnosis: Unsteadiness on feet (R26.81);Muscle weakness (generalized) (M62.81);Other symptoms and signs involving the nervous system (R29.898)   Activity Tolerance Other (comment) (Pt limited by cognitive deficits)   Patient Left in bed;with call bell/phone within reach;with bed alarm set           Time: 1610-9604 OT Time Calculation (min): 31 min  Charges: OT General Charges $OT Visit: 1 Visit OT Treatments $Self Care/Home Management : 23-37 mins  Limmie Patricia, OTR/L,CBIS  Supplemental OT - [facility] and [facility] Secure Chat Preferred    Mikah Poss, Charisse March 08/20/2023, 4:49 PM

## 2023-08-20 NOTE — Progress Notes (Signed)
[facility] PROGRESS NOTE    Progress Note  Kristine [name]  KZS:[id number] DOB: 1941/07/13 DOA: 08/14/2023 PCP: Renaye Rakers, MD     Brief Narrative:   Kristine [name] is an 82 y.o. female past medical history Lewy body dementia, essential hypertension COPD not on oxygen comes into the ED for altered mental status, anorexia associated with generalized weakness and unable to ambulate.  Labs do show hypokalemia, CT of the head showed generalized cerebral atrophy no acute intracranial abnormalities.Chest x-ray showed no acute findings.  Most of the history is obtained by the husband which is 27 years old and cannot take care of her at home.   Assessment/Plan:   Acute metabolic encephalopathy: No evidence of infectious process. Fall precaution. Melatonin at night Haldol for agitation. TSH 2.5 Free T1.4. There are no signs of infectious etiology, neurological etiologies. She was started on Klonopin at bedtime as well as Seroquel. Continue home Aricept IV Haldol for agitation. PMT to meet with family for goals of care discussion. Awaiting insurance authorization.    Lewy body dementia: Resume home meds.  Coronary artery disease: Resume aspirin, statin, Zetia and metoprolol.  Essential hypertension: Blood pressure stable continue Toprol.  Chronic diastolic heart failure: Started on IV fluids Foley due for 12 hours when she is able to take orals will discontinue.  Failure to thrive: Noted.  Hypokalemia: Resolved  Chronic anxiety: Continue Klonopin.  Ambulatory dysfunction: PT evaluated the patient will need skilled nursing facility.  DVT prophylaxis: lovenox Family Communication:none Status is: Inpatient Remains inpatient appropriate because: Acute metabolic encephalopathy    Code Status:     Code Status Orders  (From admission, onward)           Start     Ordered   08/15/23 0531  Full code  Continuous       Question:  By:  Answer:  Consent:  discussion documented in EHR   08/15/23 0530           Code Status History     Date Active Date Inactive Code Status Order ID Comments User Context   05/15/2021 0225 05/15/2021 1541 Full Code [id number]  Chotiner, Claudean Severance, MD ED   05/31/2015 0232 05/31/2015 1116 Full Code [id number]  Therisa Doyne, MD ED   03/10/2015 0012 03/12/2015 1818 Full Code [id number]  Rolly Salter, MD ED         IV Access:   Peripheral IV   Procedures and diagnostic studies:   No results found.   Medical Consultants:   None.   Subjective:    Kristine [name] no complaints  Objective:    Vitals:   08/19/23 1252 08/19/23 2033 08/20/23 0444 08/20/23 0826  BP: (!) 157/92 118/75 (!) 133/92   Pulse: 81 72 67   Resp: 18 18 18    Temp:  98.2 F (36.8 C) 97.9 F (36.6 C)   TempSrc:      SpO2: 99% 100% 100%   Weight:    49.8 kg   SpO2: 100 %   Intake/Output Summary (Last 24 hours) at 08/20/2023 1138 Last data filed at 08/20/2023 0950 Gross per 24 hour  Intake 420 ml  Output 500 ml  Net -80 ml   Filed Weights   08/18/23 0717 08/19/23 0400 08/20/23 0826  Weight: 109.4 kg 109.1 kg 49.8 kg    Exam: General exam: In no acute distress. Respiratory system: Good air movement and clear to auscultation. Cardiovascular system: S1 & S2 heard, RRR. No JVD. Gastrointestinal  system: Abdomen is nondistended, soft and nontender.  Extremities: No pedal edema. Skin: No rashes, lesions or ulcers Psychiatry: Judgement and insight appear normal. Mood & affect appropriate.    Data Reviewed:    Labs: Basic Metabolic Panel: Recent Labs  Lab 08/15/23 0104 08/15/23 0409 08/15/23 1414 08/17/23 0516  NA 139  --  138 138  K 3.0*  --  3.7 3.5  CL 103  --  102 101  CO2 27  --  25 27  GLUCOSE 100*  --  102* 103*  BUN 10  --  7* 8  CREATININE 0.92  --  0.80 0.82  CALCIUM 9.1  --  8.8* 9.5  MG  --  2.2  --  2.1  PHOS  --   --   --  3.4   GFR Estimated Creatinine Clearance: 43 mL/min (by C-G  formula based on SCr of 0.82 mg/dL). Liver Function Tests: Recent Labs  Lab 08/17/23 0516  ALBUMIN 3.2*   No results for input(s): "LIPASE", "AMYLASE" in the last 168 hours. No results for input(s): "AMMONIA" in the last 168 hours. Coagulation profile No results for input(s): "INR", "PROTIME" in the last 168 hours. COVID-19 Labs  No results for input(s): "DDIMER", "FERRITIN", "LDH", "CRP" in the last 72 hours.  Lab Results  Component Value Date   SARSCOV2NAA NEGATIVE 07/28/2023   SARSCOV2NAA NEGATIVE 05/14/2021    CBC: Recent Labs  Lab 08/15/23 0104 08/17/23 0516  WBC 5.2 6.2  NEUTROABS 3.3  --   HGB 13.2 13.4  HCT 41.8 42.7  MCV 89.3 89.7  PLT 229 302   Cardiac Enzymes: No results for input(s): "CKTOTAL", "CKMB", "CKMBINDEX", "TROPONINI" in the last 168 hours. BNP (last 3 results) No results for input(s): "PROBNP" in the last 8760 hours. CBG: No results for input(s): "GLUCAP" in the last 168 hours. D-Dimer: No results for input(s): "DDIMER" in the last 72 hours. Hgb A1c: No results for input(s): "HGBA1C" in the last 72 hours. Lipid Profile: No results for input(s): "CHOL", "HDL", "LDLCALC", "TRIG", "CHOLHDL", "LDLDIRECT" in the last 72 hours. Thyroid function studies: No results for input(s): "TSH", "T4TOTAL", "T3FREE", "THYROIDAB" in the last 72 hours.  Invalid input(s): "FREET3" Anemia work up: No results for input(s): "VITAMINB12", "FOLATE", "FERRITIN", "TIBC", "IRON", "RETICCTPCT" in the last 72 hours. Sepsis Labs: Recent Labs  Lab 08/15/23 0104 08/17/23 0516  WBC 5.2 6.2   Microbiology No results found for this or any previous visit (from the past 240 hour(s)).   Medications:    amLODipine  10 mg Oral Daily   aspirin EC  81 mg Oral Daily   atorvastatin  80 mg Oral Daily   clonazePAM  0.5 mg Oral QHS   donepezil  5 mg Oral QHS   enoxaparin (LOVENOX) injection  40 mg Subcutaneous Daily   ezetimibe  10 mg Oral Daily   melatonin  3 mg Oral QHS    methimazole  5 mg Oral BID   metoprolol succinate  50 mg Oral Daily   mirtazapine  7.5 mg Oral QHS   pantoprazole (PROTONIX) IV  40 mg Intravenous Q24H   QUEtiapine  25 mg Oral QHS   senna  1 tablet Oral Daily   Continuous Infusions:      LOS: 5 days   Marinda Elk  [facility]  08/20/2023, 11:38 AM

## 2023-08-20 NOTE — TOC Progression Note (Addendum)
Transition of Care [facility]) - Progression Note    Patient Details  Name: Kristine [name] MRN: [id number] Date of Birth: [birth year]  [facility]) CM/SW Contact  Larrie Kass, LCSW Phone Number: 08/20/2023, 9:31 AM  Clinical Narrative:    CSW received message from [facility], pt's family has paid balance. CSW to start insurance auth. TOC to follow.  Adden 9:54am Pt's insurance auth for SNF placement is pending. TOC to follow.    Expected Discharge Plan: Skilled Nursing Facility Barriers to Discharge: Continued Medical Work up  Expected Discharge Plan and Services In-house Referral: Clinical Social Work     Living arrangements for the past 2 months: Single Family Home                                       Social Determinants of Health (SDOH) Interventions SDOH Screenings   Depression (PHQ2-9): Low Risk  (03/20/2023)  Tobacco Use: Medium Risk (08/14/2023)    Readmission Risk Interventions     No data to display

## 2023-08-21 DIAGNOSIS — R1311 Dysphagia, oral phase: Secondary | ICD-10-CM | POA: Diagnosis not present

## 2023-08-21 DIAGNOSIS — R5381 Other malaise: Secondary | ICD-10-CM | POA: Diagnosis not present

## 2023-08-21 DIAGNOSIS — R2681 Unsteadiness on feet: Secondary | ICD-10-CM | POA: Diagnosis not present

## 2023-08-21 DIAGNOSIS — F419 Anxiety disorder, unspecified: Secondary | ICD-10-CM | POA: Diagnosis not present

## 2023-08-21 DIAGNOSIS — R634 Abnormal weight loss: Secondary | ICD-10-CM | POA: Diagnosis not present

## 2023-08-21 DIAGNOSIS — G9341 Metabolic encephalopathy: Secondary | ICD-10-CM | POA: Diagnosis not present

## 2023-08-21 DIAGNOSIS — E876 Hypokalemia: Secondary | ICD-10-CM | POA: Diagnosis not present

## 2023-08-21 DIAGNOSIS — M6281 Muscle weakness (generalized): Secondary | ICD-10-CM | POA: Diagnosis not present

## 2023-08-21 DIAGNOSIS — G3183 Dementia with Lewy bodies: Secondary | ICD-10-CM | POA: Diagnosis not present

## 2023-08-21 DIAGNOSIS — R627 Adult failure to thrive: Secondary | ICD-10-CM | POA: Diagnosis not present

## 2023-08-21 DIAGNOSIS — F5101 Primary insomnia: Secondary | ICD-10-CM | POA: Diagnosis not present

## 2023-08-21 DIAGNOSIS — Z9181 History of falling: Secondary | ICD-10-CM | POA: Diagnosis not present

## 2023-08-21 DIAGNOSIS — Z515 Encounter for palliative care: Secondary | ICD-10-CM | POA: Diagnosis not present

## 2023-08-21 DIAGNOSIS — R531 Weakness: Secondary | ICD-10-CM | POA: Diagnosis not present

## 2023-08-21 DIAGNOSIS — Z7401 Bed confinement status: Secondary | ICD-10-CM | POA: Diagnosis not present

## 2023-08-21 DIAGNOSIS — R262 Difficulty in walking, not elsewhere classified: Secondary | ICD-10-CM | POA: Diagnosis not present

## 2023-08-21 DIAGNOSIS — I2510 Atherosclerotic heart disease of native coronary artery without angina pectoris: Secondary | ICD-10-CM | POA: Diagnosis not present

## 2023-08-21 DIAGNOSIS — I5032 Chronic diastolic (congestive) heart failure: Secondary | ICD-10-CM | POA: Diagnosis not present

## 2023-08-21 DIAGNOSIS — R41841 Cognitive communication deficit: Secondary | ICD-10-CM | POA: Diagnosis not present

## 2023-08-21 DIAGNOSIS — F0282 Dementia in other diseases classified elsewhere, unspecified severity, with psychotic disturbance: Secondary | ICD-10-CM | POA: Diagnosis not present

## 2023-08-21 DIAGNOSIS — I110 Hypertensive heart disease with heart failure: Secondary | ICD-10-CM | POA: Diagnosis not present

## 2023-08-21 DIAGNOSIS — R4182 Altered mental status, unspecified: Secondary | ICD-10-CM | POA: Diagnosis not present

## 2023-08-21 MED ORDER — CLONAZEPAM 1 MG PO TABS: ORAL_TABLET | ORAL | 0 refills | Status: AC

## 2023-08-21 MED ORDER — MELATONIN 3 MG PO TABS: 3.0000 mg | ORAL_TABLET | Freq: Every day | ORAL | Status: AC

## 2023-08-21 NOTE — TOC Progression Note (Signed)
Transition of Care [facility]) - Progression Note    Patient Details  Name: [name] MRN: [id number] Date of Birth: March 28, 1941  [facility]) CM/SW Contact  Larrie Kass, LCSW Phone Number: 08/21/2023, 9:42 AM  Clinical Narrative:     Pt's insurance auth was approvedBerkley Harvey ID# [id number] 08/20/23- 08/24/23. TOC to follow.  Expected Discharge Plan: Skilled Nursing Facility Barriers to Discharge: Continued Medical Work up  Expected Discharge Plan and Services In-house Referral: Clinical Social Work     Living arrangements for the past 2 months: Single Family Home                                       Social Determinants of Health (SDOH) Interventions SDOH Screenings   Depression (PHQ2-9): Low Risk  (03/20/2023)  Tobacco Use: Medium Risk (08/14/2023)    Readmission Risk Interventions     No data to display

## 2023-08-21 NOTE — TOC Transition Note (Signed)
Transition of Care [facility]) - CM/SW Discharge Note   Patient Details  Name: Kristine [name] MRN: [id number] Date of Birth: [birth year]  Transition [facility] Care [facility]) CM/SW Contact:  Larrie Kass, LCSW Phone Number: 08/21/2023, 10:36 AM   Clinical Narrative:    Pt to d/c to [facility], room 406B, call report [phone]. CSW attempted to speak with pt's daughter to inform her of transfer no answer, left VM informing her of pt's d/c back to [facility]. PTAR called, no further TOC needs ,TOC sign off.   Final next level of care: Skilled Nursing Facility Barriers to Discharge: No Barriers Identified   Patient Goals and CMS Choice CMS Medicare.gov Compare Post Acute Care list provided to:: Patient Represenative (must comment) Choice offered to / list presented to : Adult Children, Spouse  Discharge Placement                Patient [facility] Patient to be transferred to facility by: ems Name of family member notified: Hird,Demetria G. (Daughter) Patient and family notified of of transfer: 08/21/23  Discharge Plan and Services Additional resources added to the After Visit Summary for   In-house Referral: Clinical Social Work                                   Social Determinants of Health (SDOH) Interventions SDOH Screenings   Depression (PHQ2-9): Low Risk  (03/20/2023)  Tobacco Use: Medium Risk (08/14/2023)     Readmission Risk Interventions     No data to display

## 2023-08-21 NOTE — Discharge Summary (Signed)
Physician Discharge Summary  Kristine [name] QIO:[id number] DOB: 25-Apr-1941 DOA: 08/14/2023  PCP: Renaye Rakers, MD  Admit date: 08/14/2023 Discharge date: 08/21/2023  Admitted From: Home Disposition:  SNF  Recommendations for Outpatient Follow-up:  Follow up with PCP in 1-2 weeks Home Health:No Equipment/Devices:None  Discharge Condition:STable CODE STATUS:DNR Diet recommendation: Heart Healthy    Brief/Interim Summary: 82 y.o. female past medical history Lewy body dementia, essential hypertension COPD not on oxygen comes into the ED for altered mental status, anorexia associated with generalized weakness and unable to ambulate.  Labs do show hypokalemia, CT of the head showed generalized cerebral atrophy no acute intracranial abnormalities.Chest x-ray showed no acute findings.  Most of the history is obtained by the husband which is 62 years old and cannot take care of her at home   Discharge Diagnoses:  Principal Problem:   AMS (altered mental status)  Acute metabolic encephalopathy: Infectious workup was negative.  Question if this was acute confusional state in the setting of Lewy body dementia recently moved from hospital to skilled nursing facility, then again from skilled nursing facility to home.  The husband just stated that she had difficulty walking and poor oral intake. We use melatonin and Haldol for agitation at night. TSH was 2.5. She was started on Klonopin at bedtime as well as Seroquel which helped with her sleep. She was continued on her home Aricept. Physical therapy evaluated the patient recommended skilled nursing facility.  Advanced dementia due to Lewy body dementia: No changes made to her medication.  Coronary artery disease: No changes made to her medication.  Essential hypertension: Blood pressure control no changes made to her medication.  Chronic diastolic heart failure: Appear euvolemic no changes made to her medication.  Failure to  thrive: Noted.  Hypokalemia: Repeated now resolved.  Chronic anxiety: Continue Klonopin.  Ambulatory dysfunction: PT evaluated the patient she will go to skilled nursing facility.  Goals of care: Palliative care met with family she was made a DNR/DNI he would be good to address moving towards hospice in the future at facility.  Discharge Instructions  Discharge Instructions     Diet - low sodium heart healthy   Complete by: As directed    Increase activity slowly   Complete by: As directed       Allergies as of 08/21/2023       Reactions   Ace Inhibitors Swelling   Angioedema of the tongue   Iodinated Contrast Media Anaphylaxis   Other Anaphylaxis   Allergic to melons, nuts   Shellfish-derived Products Anaphylaxis   Hydrocodone Other (See Comments)   unspecified        Medication List     STOP taking these medications    hydrochlorothiazide 25 MG tablet Commonly known as: HYDRODIURIL   hydrOXYzine 10 MG tablet Commonly known as: ATARAX   QUEtiapine 25 MG tablet Commonly known as: SEROQUEL       TAKE these medications    aspirin EC 81 MG tablet Take 81 mg by mouth daily.   atorvastatin 80 MG tablet Commonly known as: LIPITOR Take 1 tablet (80 mg total) by mouth daily.   clonazePAM 1 MG tablet Commonly known as: KLONOPIN 1.5 pills po qHS What changed:  how much to take how to take this when to take this   donepezil 10 MG tablet Commonly known as: ARICEPT TAKE 1 TABLET(5 MG) BY MOUTH AT BEDTIME What changed:  how much to take how to take this when to take this  ezetimibe 10 MG tablet Commonly known as: ZETIA Take 1 tablet (10 mg total) by mouth daily.   hydrALAZINE 25 MG tablet Commonly known as: APRESOLINE Take 1/2 a tablet by mouth once a day What changed:  how much to take how to take this when to take this additional instructions   melatonin 3 MG Tabs tablet Take 1 tablet (3 mg total) by mouth at bedtime.    methimazole 5 MG tablet Commonly known as: TAPAZOLE Take 5 mg by mouth 2 (two) times daily.   metoprolol succinate 50 MG 24 hr tablet Commonly known as: TOPROL-XL TAKE 1 TABLET(50 MG) BY MOUTH DAILY What changed:  how much to take how to take this when to take this additional instructions   mirtazapine 15 MG tablet Commonly known as: REMERON TAKE 1/2 TO 1 TABLET BY MOUTH EVERY NIGHT AT BEDTIME   nitroGLYCERIN 0.3 MG SL tablet Commonly known as: NITROSTAT Place 1 tablet (0.3 mg total) under the tongue every 5 (five) minutes as needed for chest pain. Max 3 doses 5 minutes apart per episode of chest pain. Call 911 if need 3rd dose.   risperiDONE 0.25 MG tablet Commonly known as: RISPERDAL Take 1 tablet (0.25 mg total) by mouth 2 (two) times daily as needed. Secondary to Lewy Body Dementia   Vitamin D3 250 MCG (10000 UT) capsule Take 10,000 Units by mouth 2 (two) times a week. Mon and Wed.        Allergies  Allergen Reactions   Ace Inhibitors Swelling    Angioedema of the tongue   Iodinated Contrast Media Anaphylaxis   Other Anaphylaxis    Allergic to melons, nuts   Shellfish-Derived Products Anaphylaxis   Hydrocodone Other (See Comments)    unspecified    Consultations: None   Procedures/Studies: CT Head Wo Contrast  Result Date: 08/15/2023 CLINICAL DATA:  Altered mental status. EXAM: CT HEAD WITHOUT CONTRAST TECHNIQUE: Contiguous axial images were obtained from the base of the skull through the vertex without intravenous contrast. RADIATION DOSE REDUCTION: This exam was performed according to the departmental dose-optimization program which includes automated exposure control, adjustment of the mA and/or kV according to patient size and/or use of iterative reconstruction technique. COMPARISON:  July 28, 2023 FINDINGS: Brain: There is mild cerebral atrophy with widening of the extra-axial spaces and ventricular dilatation. There are areas of decreased attenuation  within the white matter tracts of the supratentorial brain, consistent with microvascular disease changes. A partially empty sella is seen. Vascular: No hyperdense vessel or unexpected calcification. Skull: Normal. Negative for fracture or focal lesion. Sinuses/Orbits: No acute finding. Other: None. IMPRESSION: 1. Generalized cerebral atrophy with mild, chronic white matter small vessel ischemic changes. 2. No acute intracranial abnormality. Electronically Signed   By: Aram Candela M.D.   On: 08/15/2023 03:44   DG Chest Portable 1 View  Result Date: 08/15/2023 CLINICAL DATA:  Altered mental status. EXAM: PORTABLE CHEST 1 VIEW COMPARISON:  July 28, 2023 FINDINGS: The heart size and mediastinal contours are within normal limits. There is marked severity calcification of the thoracic aorta. Both lungs are clear. The visualized skeletal structures are unremarkable. IMPRESSION: No active cardiopulmonary disease. Electronically Signed   By: Aram Candela M.D.   On: 08/15/2023 03:38   MR BRAIN WO CONTRAST  Result Date: 07/28/2023 CLINICAL DATA:  Altered mental status EXAM: MRI HEAD WITHOUT CONTRAST TECHNIQUE: Multiplanar, multiecho pulse sequences of the brain and surrounding structures were obtained without intravenous contrast. COMPARISON:  06/07/2023 FINDINGS: Brain: No  restricted diffusion to suggest acute or subacute infarct. No acute hemorrhage, mass, mass effect, or midline shift. No hydrocephalus or extra-axial collection. Redemonstrated empty sella with expanded sella turcica, unchanged. Normal craniocervical junction. Punctate foci of hemosiderin deposition right temporal lobe and right basal ganglia, likely sequela of prior hypertensive microhemorrhage. Remote lacunar infarct in the left basal ganglia. Scattered T2 hyperintense signal in the periventricular white matter, likely the sequela of mild chronic small vessel ischemic disease. Vascular: Normal arterial flow voids. Skull and upper  cervical spine: Normal marrow signal. Sinuses/Orbits: Clear paranasal sinuses. No acute finding in the orbits. Other: The mastoid air cells are well aerated. IMPRESSION: No acute intracranial process. No evidence of acute or subacute infarct. Electronically Signed   By: Wiliam Ke M.D.   On: 07/28/2023 20:47   CT Head Wo Contrast  Result Date: 07/28/2023 CLINICAL DATA:  Mental status change, unknown cause EXAM: CT HEAD WITHOUT CONTRAST TECHNIQUE: Contiguous axial images were obtained from the base of the skull through the vertex without intravenous contrast. RADIATION DOSE REDUCTION: This exam was performed according to the departmental dose-optimization program which includes automated exposure control, adjustment of the mA and/or kV according to patient size and/or use of iterative reconstruction technique. COMPARISON:  CT head June 07, 2023. FINDINGS: Brain: No evidence of acute infarction, hemorrhage, hydrocephalus, extra-axial collection or mass lesion/mass effect. Partially empty sella. Vascular: No hyperdense vessel identified. Skull: No acute fracture. Sinuses/Orbits: Clear sinuses.  No acute orbital findings. Other: No mastoid effusions. IMPRESSION: No evidence of acute intracranial abnormality. Electronically Signed   By: Feliberto Harts M.D.   On: 07/28/2023 18:20   DG Chest Portable 1 View  Result Date: 07/28/2023 CLINICAL DATA:  weakness EXAM: PORTABLE CHEST 1 VIEW COMPARISON:  CXR 06/07/23 FINDINGS: No pleural effusion. No pneumothorax. Normal cardiac and mediastinal contours. No focal airspace opacity. No radiographically apparent displaced rib fractures. Visualized upper abdomen unremarkable. IMPRESSION: No focal airspace opacity Electronically Signed   By: Lorenza Cambridge M.D.   On: 07/28/2023 16:48   (Echo, Carotid, EGD, Colonoscopy, ERCP)    Subjective: No complaint  Discharge Exam: Vitals:   08/20/23 2057 08/21/23 0443  BP: (!) 166/99 (!) 146/94  Pulse: 83 83  Resp: 17 17   Temp: 98.7 F (37.1 C) 98.2 F (36.8 C)  SpO2: 100% 99%   Vitals:   08/20/23 1320 08/20/23 1644 08/20/23 2057 08/21/23 0443  BP: 115/68 115/83 (!) 166/99 (!) 146/94  Pulse: 82 84 83 83  Resp: 18 13 17 17   Temp: 97.8 F (36.6 C) 98.9 F (37.2 C) 98.7 F (37.1 C) 98.2 F (36.8 C)  TempSrc:  Oral    SpO2: 99% 98% 100% 99%  Weight:        General: Pt is alert, awake, not in acute distress Cardiovascular: RRR, S1/S2 +, no rubs, no gallops Respiratory: CTA bilaterally, no wheezing, no rhonchi Abdominal: Soft, NT, ND, bowel sounds + Extremities: no edema, no cyanosis    The results of significant diagnostics from this hospitalization (including imaging, microbiology, ancillary and laboratory) are listed below for reference.     Microbiology: No results found for this or any previous visit (from the past 240 hour(s)).   Labs: BNP (last 3 results) Recent Labs    10/11/22 2303  BNP 133.1*   Basic Metabolic Panel: Recent Labs  Lab 08/15/23 0104 08/15/23 0409 08/15/23 1414 08/17/23 0516  NA 139  --  138 138  K 3.0*  --  3.7 3.5  CL 103  --  102 101  CO2 27  --  25 27  GLUCOSE 100*  --  102* 103*  BUN 10  --  7* 8  CREATININE 0.92  --  0.80 0.82  CALCIUM 9.1  --  8.8* 9.5  MG  --  2.2  --  2.1  PHOS  --   --   --  3.4   Liver Function Tests: Recent Labs  Lab 08/17/23 0516  ALBUMIN 3.2*   No results for input(s): "LIPASE", "AMYLASE" in the last 168 hours. No results for input(s): "AMMONIA" in the last 168 hours. CBC: Recent Labs  Lab 08/15/23 0104 08/17/23 0516  WBC 5.2 6.2  NEUTROABS 3.3  --   HGB 13.2 13.4  HCT 41.8 42.7  MCV 89.3 89.7  PLT 229 302   Cardiac Enzymes: No results for input(s): "CKTOTAL", "CKMB", "CKMBINDEX", "TROPONINI" in the last 168 hours. BNP: Invalid input(s): "POCBNP" CBG: No results for input(s): "GLUCAP" in the last 168 hours. D-Dimer No results for input(s): "DDIMER" in the last 72 hours. Hgb A1c No results for  input(s): "HGBA1C" in the last 72 hours. Lipid Profile No results for input(s): "CHOL", "HDL", "LDLCALC", "TRIG", "CHOLHDL", "LDLDIRECT" in the last 72 hours. Thyroid function studies No results for input(s): "TSH", "T4TOTAL", "T3FREE", "THYROIDAB" in the last 72 hours.  Invalid input(s): "FREET3" Anemia work up No results for input(s): "VITAMINB12", "FOLATE", "FERRITIN", "TIBC", "IRON", "RETICCTPCT" in the last 72 hours. Urinalysis    Component Value Date/Time   COLORURINE YELLOW (A) 08/15/2023 0122   APPEARANCEUR CLEAR (A) 08/15/2023 0122   LABSPEC 1.014 08/15/2023 0122   PHURINE 5.0 08/15/2023 0122   GLUCOSEU NEGATIVE 08/15/2023 0122   HGBUR NEGATIVE 08/15/2023 0122   BILIRUBINUR NEGATIVE 08/15/2023 0122   KETONESUR NEGATIVE 08/15/2023 0122   PROTEINUR NEGATIVE 08/15/2023 0122   UROBILINOGEN 0.2 03/09/2015 2216   NITRITE NEGATIVE 08/15/2023 0122   LEUKOCYTESUR NEGATIVE 08/15/2023 0122   Sepsis Labs Recent Labs  Lab 08/15/23 0104 08/17/23 0516  WBC 5.2 6.2   Microbiology No results found for this or any previous visit (from the past 240 hour(s)).   Time coordinating discharge: Over 35 minutes  SIGNED:   Marinda Elk, MD  [facility] 08/21/2023, 10:04 AM Pager   If 7PM-7AM, please contact night-coverage [email or web address] Password [email or web address]

## 2023-08-24 DIAGNOSIS — E876 Hypokalemia: Secondary | ICD-10-CM | POA: Diagnosis not present

## 2023-08-24 DIAGNOSIS — R5381 Other malaise: Secondary | ICD-10-CM | POA: Diagnosis not present

## 2023-08-24 DIAGNOSIS — R262 Difficulty in walking, not elsewhere classified: Secondary | ICD-10-CM | POA: Diagnosis not present

## 2023-08-24 DIAGNOSIS — Z9181 History of falling: Secondary | ICD-10-CM | POA: Diagnosis not present

## 2023-08-24 DIAGNOSIS — G3183 Dementia with Lewy bodies: Secondary | ICD-10-CM | POA: Diagnosis not present

## 2023-08-24 DIAGNOSIS — G9341 Metabolic encephalopathy: Secondary | ICD-10-CM | POA: Diagnosis not present

## 2023-08-24 DIAGNOSIS — R2681 Unsteadiness on feet: Secondary | ICD-10-CM | POA: Diagnosis not present

## 2023-08-24 DIAGNOSIS — F419 Anxiety disorder, unspecified: Secondary | ICD-10-CM | POA: Diagnosis not present

## 2023-08-24 DIAGNOSIS — R4182 Altered mental status, unspecified: Secondary | ICD-10-CM | POA: Diagnosis not present

## 2023-08-24 DIAGNOSIS — I2510 Atherosclerotic heart disease of native coronary artery without angina pectoris: Secondary | ICD-10-CM | POA: Diagnosis not present

## 2023-08-24 DIAGNOSIS — M6281 Muscle weakness (generalized): Secondary | ICD-10-CM | POA: Diagnosis not present

## 2023-08-24 DIAGNOSIS — I110 Hypertensive heart disease with heart failure: Secondary | ICD-10-CM | POA: Diagnosis not present

## 2023-08-24 DIAGNOSIS — I5032 Chronic diastolic (congestive) heart failure: Secondary | ICD-10-CM | POA: Diagnosis not present

## 2023-08-24 DIAGNOSIS — R627 Adult failure to thrive: Secondary | ICD-10-CM | POA: Diagnosis not present

## 2023-08-26 DIAGNOSIS — R2681 Unsteadiness on feet: Secondary | ICD-10-CM | POA: Diagnosis not present

## 2023-08-26 DIAGNOSIS — R5381 Other malaise: Secondary | ICD-10-CM | POA: Diagnosis not present

## 2023-08-26 DIAGNOSIS — M6281 Muscle weakness (generalized): Secondary | ICD-10-CM | POA: Diagnosis not present

## 2023-08-26 DIAGNOSIS — R627 Adult failure to thrive: Secondary | ICD-10-CM | POA: Diagnosis not present

## 2023-08-26 DIAGNOSIS — Z9181 History of falling: Secondary | ICD-10-CM | POA: Diagnosis not present

## 2023-08-26 DIAGNOSIS — G3183 Dementia with Lewy bodies: Secondary | ICD-10-CM | POA: Diagnosis not present

## 2023-08-27 DIAGNOSIS — R634 Abnormal weight loss: Secondary | ICD-10-CM | POA: Diagnosis not present

## 2023-08-27 DIAGNOSIS — Z515 Encounter for palliative care: Secondary | ICD-10-CM | POA: Diagnosis not present

## 2023-08-31 DIAGNOSIS — Z9181 History of falling: Secondary | ICD-10-CM | POA: Diagnosis not present

## 2023-08-31 DIAGNOSIS — G3183 Dementia with Lewy bodies: Secondary | ICD-10-CM | POA: Diagnosis not present

## 2023-08-31 DIAGNOSIS — R2681 Unsteadiness on feet: Secondary | ICD-10-CM | POA: Diagnosis not present

## 2023-08-31 DIAGNOSIS — R627 Adult failure to thrive: Secondary | ICD-10-CM | POA: Diagnosis not present

## 2023-08-31 DIAGNOSIS — M6281 Muscle weakness (generalized): Secondary | ICD-10-CM | POA: Diagnosis not present

## 2023-08-31 DIAGNOSIS — R5381 Other malaise: Secondary | ICD-10-CM | POA: Diagnosis not present

## 2023-09-02 DIAGNOSIS — M6281 Muscle weakness (generalized): Secondary | ICD-10-CM | POA: Diagnosis not present

## 2023-09-02 DIAGNOSIS — G3183 Dementia with Lewy bodies: Secondary | ICD-10-CM | POA: Diagnosis not present

## 2023-09-02 DIAGNOSIS — R5381 Other malaise: Secondary | ICD-10-CM | POA: Diagnosis not present

## 2023-09-02 DIAGNOSIS — R627 Adult failure to thrive: Secondary | ICD-10-CM | POA: Diagnosis not present

## 2023-09-02 DIAGNOSIS — Z9181 History of falling: Secondary | ICD-10-CM | POA: Diagnosis not present

## 2023-09-02 DIAGNOSIS — R2681 Unsteadiness on feet: Secondary | ICD-10-CM | POA: Diagnosis not present

## 2023-09-07 DIAGNOSIS — R5381 Other malaise: Secondary | ICD-10-CM | POA: Diagnosis not present

## 2023-09-07 DIAGNOSIS — Z9181 History of falling: Secondary | ICD-10-CM | POA: Diagnosis not present

## 2023-09-07 DIAGNOSIS — G3183 Dementia with Lewy bodies: Secondary | ICD-10-CM | POA: Diagnosis not present

## 2023-09-07 DIAGNOSIS — F5101 Primary insomnia: Secondary | ICD-10-CM | POA: Diagnosis not present

## 2023-09-07 DIAGNOSIS — M6281 Muscle weakness (generalized): Secondary | ICD-10-CM | POA: Diagnosis not present

## 2023-09-07 DIAGNOSIS — F0282 Dementia in other diseases classified elsewhere, unspecified severity, with psychotic disturbance: Secondary | ICD-10-CM | POA: Diagnosis not present

## 2023-09-07 DIAGNOSIS — R627 Adult failure to thrive: Secondary | ICD-10-CM | POA: Diagnosis not present

## 2023-09-07 DIAGNOSIS — R2681 Unsteadiness on feet: Secondary | ICD-10-CM | POA: Diagnosis not present

## 2023-09-09 DIAGNOSIS — R2681 Unsteadiness on feet: Secondary | ICD-10-CM | POA: Diagnosis not present

## 2023-09-09 DIAGNOSIS — R627 Adult failure to thrive: Secondary | ICD-10-CM | POA: Diagnosis not present

## 2023-09-09 DIAGNOSIS — G3183 Dementia with Lewy bodies: Secondary | ICD-10-CM | POA: Diagnosis not present

## 2023-09-09 DIAGNOSIS — Z9181 History of falling: Secondary | ICD-10-CM | POA: Diagnosis not present

## 2023-09-09 DIAGNOSIS — R5381 Other malaise: Secondary | ICD-10-CM | POA: Diagnosis not present

## 2023-09-09 DIAGNOSIS — M6281 Muscle weakness (generalized): Secondary | ICD-10-CM | POA: Diagnosis not present

## 2023-09-11 DIAGNOSIS — F0280 Dementia in other diseases classified elsewhere without behavioral disturbance: Secondary | ICD-10-CM | POA: Diagnosis not present

## 2023-09-11 DIAGNOSIS — R140 Abdominal distension (gaseous): Secondary | ICD-10-CM | POA: Diagnosis not present

## 2023-09-11 DIAGNOSIS — R262 Difficulty in walking, not elsewhere classified: Secondary | ICD-10-CM | POA: Diagnosis not present

## 2023-09-11 DIAGNOSIS — E039 Hypothyroidism, unspecified: Secondary | ICD-10-CM | POA: Diagnosis not present

## 2023-09-11 DIAGNOSIS — I5032 Chronic diastolic (congestive) heart failure: Secondary | ICD-10-CM | POA: Diagnosis not present

## 2023-09-11 DIAGNOSIS — R2681 Unsteadiness on feet: Secondary | ICD-10-CM | POA: Diagnosis not present

## 2023-09-11 DIAGNOSIS — E785 Hyperlipidemia, unspecified: Secondary | ICD-10-CM | POA: Diagnosis not present

## 2023-09-11 DIAGNOSIS — R41841 Cognitive communication deficit: Secondary | ICD-10-CM | POA: Diagnosis not present

## 2023-09-11 DIAGNOSIS — R109 Unspecified abdominal pain: Secondary | ICD-10-CM | POA: Diagnosis not present

## 2023-09-11 DIAGNOSIS — J449 Chronic obstructive pulmonary disease, unspecified: Secondary | ICD-10-CM | POA: Diagnosis not present

## 2023-09-11 DIAGNOSIS — E559 Vitamin D deficiency, unspecified: Secondary | ICD-10-CM | POA: Diagnosis not present

## 2023-09-11 DIAGNOSIS — M6281 Muscle weakness (generalized): Secondary | ICD-10-CM | POA: Diagnosis not present

## 2023-09-11 DIAGNOSIS — R1311 Dysphagia, oral phase: Secondary | ICD-10-CM | POA: Diagnosis not present

## 2023-09-11 DIAGNOSIS — G3183 Dementia with Lewy bodies: Secondary | ICD-10-CM | POA: Diagnosis not present

## 2023-09-13 DIAGNOSIS — R1311 Dysphagia, oral phase: Secondary | ICD-10-CM | POA: Diagnosis not present

## 2023-09-13 DIAGNOSIS — G3183 Dementia with Lewy bodies: Secondary | ICD-10-CM | POA: Diagnosis not present

## 2023-09-13 DIAGNOSIS — R2681 Unsteadiness on feet: Secondary | ICD-10-CM | POA: Diagnosis not present

## 2023-09-13 DIAGNOSIS — M6281 Muscle weakness (generalized): Secondary | ICD-10-CM | POA: Diagnosis not present

## 2023-09-13 DIAGNOSIS — R262 Difficulty in walking, not elsewhere classified: Secondary | ICD-10-CM | POA: Diagnosis not present

## 2023-09-13 DIAGNOSIS — R41841 Cognitive communication deficit: Secondary | ICD-10-CM | POA: Diagnosis not present

## 2023-09-14 DIAGNOSIS — R41841 Cognitive communication deficit: Secondary | ICD-10-CM | POA: Diagnosis not present

## 2023-09-14 DIAGNOSIS — G3183 Dementia with Lewy bodies: Secondary | ICD-10-CM | POA: Diagnosis not present

## 2023-09-14 DIAGNOSIS — R1311 Dysphagia, oral phase: Secondary | ICD-10-CM | POA: Diagnosis not present

## 2023-09-14 DIAGNOSIS — R2681 Unsteadiness on feet: Secondary | ICD-10-CM | POA: Diagnosis not present

## 2023-09-14 DIAGNOSIS — M6281 Muscle weakness (generalized): Secondary | ICD-10-CM | POA: Diagnosis not present

## 2023-09-14 DIAGNOSIS — R262 Difficulty in walking, not elsewhere classified: Secondary | ICD-10-CM | POA: Diagnosis not present

## 2023-09-15 ENCOUNTER — Ambulatory Visit: Payer: Medicare PPO | Admitting: Neurology

## 2023-09-15 DIAGNOSIS — F02811 Dementia in other diseases classified elsewhere, unspecified severity, with agitation: Secondary | ICD-10-CM | POA: Diagnosis not present

## 2023-09-15 DIAGNOSIS — M6281 Muscle weakness (generalized): Secondary | ICD-10-CM | POA: Diagnosis not present

## 2023-09-15 DIAGNOSIS — R1311 Dysphagia, oral phase: Secondary | ICD-10-CM | POA: Diagnosis not present

## 2023-09-15 DIAGNOSIS — F0284 Dementia in other diseases classified elsewhere, unspecified severity, with anxiety: Secondary | ICD-10-CM | POA: Diagnosis not present

## 2023-09-15 DIAGNOSIS — I110 Hypertensive heart disease with heart failure: Secondary | ICD-10-CM | POA: Diagnosis not present

## 2023-09-15 DIAGNOSIS — E039 Hypothyroidism, unspecified: Secondary | ICD-10-CM | POA: Diagnosis not present

## 2023-09-15 DIAGNOSIS — R41841 Cognitive communication deficit: Secondary | ICD-10-CM | POA: Diagnosis not present

## 2023-09-15 DIAGNOSIS — G3183 Dementia with Lewy bodies: Secondary | ICD-10-CM | POA: Diagnosis not present

## 2023-09-15 DIAGNOSIS — F419 Anxiety disorder, unspecified: Secondary | ICD-10-CM | POA: Diagnosis not present

## 2023-09-15 DIAGNOSIS — E559 Vitamin D deficiency, unspecified: Secondary | ICD-10-CM | POA: Diagnosis not present

## 2023-09-15 DIAGNOSIS — E785 Hyperlipidemia, unspecified: Secondary | ICD-10-CM | POA: Diagnosis not present

## 2023-09-15 DIAGNOSIS — D519 Vitamin B12 deficiency anemia, unspecified: Secondary | ICD-10-CM | POA: Diagnosis not present

## 2023-09-15 DIAGNOSIS — I10 Essential (primary) hypertension: Secondary | ICD-10-CM | POA: Diagnosis not present

## 2023-09-15 DIAGNOSIS — I5032 Chronic diastolic (congestive) heart failure: Secondary | ICD-10-CM | POA: Diagnosis not present

## 2023-09-15 DIAGNOSIS — R2681 Unsteadiness on feet: Secondary | ICD-10-CM | POA: Diagnosis not present

## 2023-09-15 DIAGNOSIS — R262 Difficulty in walking, not elsewhere classified: Secondary | ICD-10-CM | POA: Diagnosis not present

## 2023-09-16 DIAGNOSIS — R262 Difficulty in walking, not elsewhere classified: Secondary | ICD-10-CM | POA: Diagnosis not present

## 2023-09-16 DIAGNOSIS — R2681 Unsteadiness on feet: Secondary | ICD-10-CM | POA: Diagnosis not present

## 2023-09-16 DIAGNOSIS — R1311 Dysphagia, oral phase: Secondary | ICD-10-CM | POA: Diagnosis not present

## 2023-09-16 DIAGNOSIS — M6281 Muscle weakness (generalized): Secondary | ICD-10-CM | POA: Diagnosis not present

## 2023-09-16 DIAGNOSIS — G3183 Dementia with Lewy bodies: Secondary | ICD-10-CM | POA: Diagnosis not present

## 2023-09-16 DIAGNOSIS — R41841 Cognitive communication deficit: Secondary | ICD-10-CM | POA: Diagnosis not present

## 2023-09-17 DIAGNOSIS — Z515 Encounter for palliative care: Secondary | ICD-10-CM | POA: Diagnosis not present

## 2023-09-17 DIAGNOSIS — R634 Abnormal weight loss: Secondary | ICD-10-CM | POA: Diagnosis not present

## 2023-09-28 DIAGNOSIS — F0282 Dementia in other diseases classified elsewhere, unspecified severity, with psychotic disturbance: Secondary | ICD-10-CM | POA: Diagnosis not present

## 2023-09-28 DIAGNOSIS — G3183 Dementia with Lewy bodies: Secondary | ICD-10-CM | POA: Diagnosis not present

## 2023-09-28 DIAGNOSIS — F5101 Primary insomnia: Secondary | ICD-10-CM | POA: Diagnosis not present

## 2023-10-01 DIAGNOSIS — R109 Unspecified abdominal pain: Secondary | ICD-10-CM | POA: Diagnosis not present

## 2023-10-01 DIAGNOSIS — F419 Anxiety disorder, unspecified: Secondary | ICD-10-CM | POA: Diagnosis not present

## 2023-10-01 DIAGNOSIS — R400 Somnolence: Secondary | ICD-10-CM | POA: Diagnosis not present

## 2023-10-12 DIAGNOSIS — G3183 Dementia with Lewy bodies: Secondary | ICD-10-CM | POA: Diagnosis not present

## 2023-10-12 DIAGNOSIS — F0282 Dementia in other diseases classified elsewhere, unspecified severity, with psychotic disturbance: Secondary | ICD-10-CM | POA: Diagnosis not present

## 2023-10-12 DIAGNOSIS — J449 Chronic obstructive pulmonary disease, unspecified: Secondary | ICD-10-CM | POA: Diagnosis not present

## 2023-10-12 DIAGNOSIS — I5032 Chronic diastolic (congestive) heart failure: Secondary | ICD-10-CM | POA: Diagnosis not present

## 2023-10-12 DIAGNOSIS — I110 Hypertensive heart disease with heart failure: Secondary | ICD-10-CM | POA: Diagnosis not present

## 2023-10-12 DIAGNOSIS — F0280 Dementia in other diseases classified elsewhere without behavioral disturbance: Secondary | ICD-10-CM | POA: Diagnosis not present

## 2023-10-12 DIAGNOSIS — I2510 Atherosclerotic heart disease of native coronary artery without angina pectoris: Secondary | ICD-10-CM | POA: Diagnosis not present

## 2023-10-12 DIAGNOSIS — F5101 Primary insomnia: Secondary | ICD-10-CM | POA: Diagnosis not present

## 2023-10-12 DIAGNOSIS — E0590 Thyrotoxicosis, unspecified without thyrotoxic crisis or storm: Secondary | ICD-10-CM | POA: Diagnosis not present

## 2023-10-13 DIAGNOSIS — R2689 Other abnormalities of gait and mobility: Secondary | ICD-10-CM | POA: Diagnosis not present

## 2023-10-13 DIAGNOSIS — G3183 Dementia with Lewy bodies: Secondary | ICD-10-CM | POA: Diagnosis not present

## 2023-10-13 DIAGNOSIS — R1311 Dysphagia, oral phase: Secondary | ICD-10-CM | POA: Diagnosis not present

## 2023-10-13 DIAGNOSIS — M6281 Muscle weakness (generalized): Secondary | ICD-10-CM | POA: Diagnosis not present

## 2023-10-13 DIAGNOSIS — R41841 Cognitive communication deficit: Secondary | ICD-10-CM | POA: Diagnosis not present

## 2023-10-14 DIAGNOSIS — M6281 Muscle weakness (generalized): Secondary | ICD-10-CM | POA: Diagnosis not present

## 2023-10-14 DIAGNOSIS — R2689 Other abnormalities of gait and mobility: Secondary | ICD-10-CM | POA: Diagnosis not present

## 2023-10-14 DIAGNOSIS — R1311 Dysphagia, oral phase: Secondary | ICD-10-CM | POA: Diagnosis not present

## 2023-10-14 DIAGNOSIS — G3183 Dementia with Lewy bodies: Secondary | ICD-10-CM | POA: Diagnosis not present

## 2023-10-14 DIAGNOSIS — R41841 Cognitive communication deficit: Secondary | ICD-10-CM | POA: Diagnosis not present

## 2023-10-15 DIAGNOSIS — R41841 Cognitive communication deficit: Secondary | ICD-10-CM | POA: Diagnosis not present

## 2023-10-15 DIAGNOSIS — M6281 Muscle weakness (generalized): Secondary | ICD-10-CM | POA: Diagnosis not present

## 2023-10-15 DIAGNOSIS — G3183 Dementia with Lewy bodies: Secondary | ICD-10-CM | POA: Diagnosis not present

## 2023-10-15 DIAGNOSIS — R2689 Other abnormalities of gait and mobility: Secondary | ICD-10-CM | POA: Diagnosis not present

## 2023-10-15 DIAGNOSIS — R1311 Dysphagia, oral phase: Secondary | ICD-10-CM | POA: Diagnosis not present

## 2023-10-16 DIAGNOSIS — R54 Age-related physical debility: Secondary | ICD-10-CM | POA: Diagnosis not present

## 2023-10-16 DIAGNOSIS — R2689 Other abnormalities of gait and mobility: Secondary | ICD-10-CM | POA: Diagnosis not present

## 2023-10-16 DIAGNOSIS — R41841 Cognitive communication deficit: Secondary | ICD-10-CM | POA: Diagnosis not present

## 2023-10-16 DIAGNOSIS — R1311 Dysphagia, oral phase: Secondary | ICD-10-CM | POA: Diagnosis not present

## 2023-10-16 DIAGNOSIS — G3183 Dementia with Lewy bodies: Secondary | ICD-10-CM | POA: Diagnosis not present

## 2023-10-16 DIAGNOSIS — R634 Abnormal weight loss: Secondary | ICD-10-CM | POA: Diagnosis not present

## 2023-10-16 DIAGNOSIS — Z515 Encounter for palliative care: Secondary | ICD-10-CM | POA: Diagnosis not present

## 2023-10-16 DIAGNOSIS — M6281 Muscle weakness (generalized): Secondary | ICD-10-CM | POA: Diagnosis not present

## 2023-10-19 DIAGNOSIS — R2689 Other abnormalities of gait and mobility: Secondary | ICD-10-CM | POA: Diagnosis not present

## 2023-10-19 DIAGNOSIS — R1311 Dysphagia, oral phase: Secondary | ICD-10-CM | POA: Diagnosis not present

## 2023-10-19 DIAGNOSIS — G3183 Dementia with Lewy bodies: Secondary | ICD-10-CM | POA: Diagnosis not present

## 2023-10-19 DIAGNOSIS — R41841 Cognitive communication deficit: Secondary | ICD-10-CM | POA: Diagnosis not present

## 2023-10-19 DIAGNOSIS — M6281 Muscle weakness (generalized): Secondary | ICD-10-CM | POA: Diagnosis not present

## 2023-10-20 DIAGNOSIS — R2689 Other abnormalities of gait and mobility: Secondary | ICD-10-CM | POA: Diagnosis not present

## 2023-10-20 DIAGNOSIS — G3183 Dementia with Lewy bodies: Secondary | ICD-10-CM | POA: Diagnosis not present

## 2023-10-20 DIAGNOSIS — R41841 Cognitive communication deficit: Secondary | ICD-10-CM | POA: Diagnosis not present

## 2023-10-20 DIAGNOSIS — R1311 Dysphagia, oral phase: Secondary | ICD-10-CM | POA: Diagnosis not present

## 2023-10-20 DIAGNOSIS — M6281 Muscle weakness (generalized): Secondary | ICD-10-CM | POA: Diagnosis not present

## 2023-10-22 DIAGNOSIS — R41841 Cognitive communication deficit: Secondary | ICD-10-CM | POA: Diagnosis not present

## 2023-10-22 DIAGNOSIS — R1311 Dysphagia, oral phase: Secondary | ICD-10-CM | POA: Diagnosis not present

## 2023-10-22 DIAGNOSIS — M6281 Muscle weakness (generalized): Secondary | ICD-10-CM | POA: Diagnosis not present

## 2023-10-22 DIAGNOSIS — R2689 Other abnormalities of gait and mobility: Secondary | ICD-10-CM | POA: Diagnosis not present

## 2023-10-22 DIAGNOSIS — G3183 Dementia with Lewy bodies: Secondary | ICD-10-CM | POA: Diagnosis not present

## 2023-10-23 DIAGNOSIS — M6281 Muscle weakness (generalized): Secondary | ICD-10-CM | POA: Diagnosis not present

## 2023-10-23 DIAGNOSIS — R2689 Other abnormalities of gait and mobility: Secondary | ICD-10-CM | POA: Diagnosis not present

## 2023-10-23 DIAGNOSIS — R41841 Cognitive communication deficit: Secondary | ICD-10-CM | POA: Diagnosis not present

## 2023-10-23 DIAGNOSIS — R1311 Dysphagia, oral phase: Secondary | ICD-10-CM | POA: Diagnosis not present

## 2023-10-23 DIAGNOSIS — G3183 Dementia with Lewy bodies: Secondary | ICD-10-CM | POA: Diagnosis not present

## 2023-10-24 DIAGNOSIS — R1311 Dysphagia, oral phase: Secondary | ICD-10-CM | POA: Diagnosis not present

## 2023-10-24 DIAGNOSIS — R2689 Other abnormalities of gait and mobility: Secondary | ICD-10-CM | POA: Diagnosis not present

## 2023-10-24 DIAGNOSIS — M6281 Muscle weakness (generalized): Secondary | ICD-10-CM | POA: Diagnosis not present

## 2023-10-24 DIAGNOSIS — R41841 Cognitive communication deficit: Secondary | ICD-10-CM | POA: Diagnosis not present

## 2023-10-24 DIAGNOSIS — G3183 Dementia with Lewy bodies: Secondary | ICD-10-CM | POA: Diagnosis not present

## 2023-10-26 DIAGNOSIS — M6281 Muscle weakness (generalized): Secondary | ICD-10-CM | POA: Diagnosis not present

## 2023-10-26 DIAGNOSIS — G3183 Dementia with Lewy bodies: Secondary | ICD-10-CM | POA: Diagnosis not present

## 2023-10-26 DIAGNOSIS — R1311 Dysphagia, oral phase: Secondary | ICD-10-CM | POA: Diagnosis not present

## 2023-10-26 DIAGNOSIS — R41841 Cognitive communication deficit: Secondary | ICD-10-CM | POA: Diagnosis not present

## 2023-10-26 DIAGNOSIS — R2689 Other abnormalities of gait and mobility: Secondary | ICD-10-CM | POA: Diagnosis not present

## 2023-10-27 DIAGNOSIS — R41841 Cognitive communication deficit: Secondary | ICD-10-CM | POA: Diagnosis not present

## 2023-10-27 DIAGNOSIS — R1311 Dysphagia, oral phase: Secondary | ICD-10-CM | POA: Diagnosis not present

## 2023-10-27 DIAGNOSIS — M6281 Muscle weakness (generalized): Secondary | ICD-10-CM | POA: Diagnosis not present

## 2023-10-27 DIAGNOSIS — G3183 Dementia with Lewy bodies: Secondary | ICD-10-CM | POA: Diagnosis not present

## 2023-10-27 DIAGNOSIS — R2689 Other abnormalities of gait and mobility: Secondary | ICD-10-CM | POA: Diagnosis not present

## 2023-10-28 DIAGNOSIS — Z515 Encounter for palliative care: Secondary | ICD-10-CM | POA: Diagnosis not present

## 2023-10-28 DIAGNOSIS — R634 Abnormal weight loss: Secondary | ICD-10-CM | POA: Diagnosis not present

## 2023-10-28 DIAGNOSIS — D1722 Benign lipomatous neoplasm of skin and subcutaneous tissue of left arm: Secondary | ICD-10-CM | POA: Diagnosis not present

## 2023-10-28 DIAGNOSIS — M6281 Muscle weakness (generalized): Secondary | ICD-10-CM | POA: Diagnosis not present

## 2023-10-28 DIAGNOSIS — R2232 Localized swelling, mass and lump, left upper limb: Secondary | ICD-10-CM | POA: Diagnosis not present

## 2023-10-28 DIAGNOSIS — R54 Age-related physical debility: Secondary | ICD-10-CM | POA: Diagnosis not present

## 2023-10-28 DIAGNOSIS — G3183 Dementia with Lewy bodies: Secondary | ICD-10-CM | POA: Diagnosis not present

## 2023-10-28 DIAGNOSIS — K5900 Constipation, unspecified: Secondary | ICD-10-CM | POA: Diagnosis not present

## 2023-10-28 DIAGNOSIS — R400 Somnolence: Secondary | ICD-10-CM | POA: Diagnosis not present

## 2023-10-28 DIAGNOSIS — R1311 Dysphagia, oral phase: Secondary | ICD-10-CM | POA: Diagnosis not present

## 2023-10-28 DIAGNOSIS — R41841 Cognitive communication deficit: Secondary | ICD-10-CM | POA: Diagnosis not present

## 2023-10-28 DIAGNOSIS — R2689 Other abnormalities of gait and mobility: Secondary | ICD-10-CM | POA: Diagnosis not present

## 2023-10-28 DIAGNOSIS — F0282 Dementia in other diseases classified elsewhere, unspecified severity, with psychotic disturbance: Secondary | ICD-10-CM | POA: Diagnosis not present

## 2023-10-28 DIAGNOSIS — F0280 Dementia in other diseases classified elsewhere without behavioral disturbance: Secondary | ICD-10-CM | POA: Diagnosis not present

## 2023-10-28 DIAGNOSIS — I10 Essential (primary) hypertension: Secondary | ICD-10-CM | POA: Diagnosis not present

## 2023-10-28 DIAGNOSIS — F5101 Primary insomnia: Secondary | ICD-10-CM | POA: Diagnosis not present

## 2023-10-29 DIAGNOSIS — R2689 Other abnormalities of gait and mobility: Secondary | ICD-10-CM | POA: Diagnosis not present

## 2023-10-29 DIAGNOSIS — M6281 Muscle weakness (generalized): Secondary | ICD-10-CM | POA: Diagnosis not present

## 2023-10-29 DIAGNOSIS — G3183 Dementia with Lewy bodies: Secondary | ICD-10-CM | POA: Diagnosis not present

## 2023-10-29 DIAGNOSIS — R1311 Dysphagia, oral phase: Secondary | ICD-10-CM | POA: Diagnosis not present

## 2023-10-29 DIAGNOSIS — R41841 Cognitive communication deficit: Secondary | ICD-10-CM | POA: Diagnosis not present

## 2023-10-30 DIAGNOSIS — R1311 Dysphagia, oral phase: Secondary | ICD-10-CM | POA: Diagnosis not present

## 2023-10-30 DIAGNOSIS — G3183 Dementia with Lewy bodies: Secondary | ICD-10-CM | POA: Diagnosis not present

## 2023-10-30 DIAGNOSIS — R41841 Cognitive communication deficit: Secondary | ICD-10-CM | POA: Diagnosis not present

## 2023-10-30 DIAGNOSIS — R2689 Other abnormalities of gait and mobility: Secondary | ICD-10-CM | POA: Diagnosis not present

## 2023-10-30 DIAGNOSIS — M6281 Muscle weakness (generalized): Secondary | ICD-10-CM | POA: Diagnosis not present

## 2023-11-01 DIAGNOSIS — R41841 Cognitive communication deficit: Secondary | ICD-10-CM | POA: Diagnosis not present

## 2023-11-01 DIAGNOSIS — R2689 Other abnormalities of gait and mobility: Secondary | ICD-10-CM | POA: Diagnosis not present

## 2023-11-01 DIAGNOSIS — G3183 Dementia with Lewy bodies: Secondary | ICD-10-CM | POA: Diagnosis not present

## 2023-11-01 DIAGNOSIS — R1311 Dysphagia, oral phase: Secondary | ICD-10-CM | POA: Diagnosis not present

## 2023-11-01 DIAGNOSIS — M6281 Muscle weakness (generalized): Secondary | ICD-10-CM | POA: Diagnosis not present

## 2023-11-02 DIAGNOSIS — R2689 Other abnormalities of gait and mobility: Secondary | ICD-10-CM | POA: Diagnosis not present

## 2023-11-02 DIAGNOSIS — R41841 Cognitive communication deficit: Secondary | ICD-10-CM | POA: Diagnosis not present

## 2023-11-02 DIAGNOSIS — M6281 Muscle weakness (generalized): Secondary | ICD-10-CM | POA: Diagnosis not present

## 2023-11-02 DIAGNOSIS — G3183 Dementia with Lewy bodies: Secondary | ICD-10-CM | POA: Diagnosis not present

## 2023-11-02 DIAGNOSIS — R1311 Dysphagia, oral phase: Secondary | ICD-10-CM | POA: Diagnosis not present

## 2023-11-03 DIAGNOSIS — R2689 Other abnormalities of gait and mobility: Secondary | ICD-10-CM | POA: Diagnosis not present

## 2023-11-03 DIAGNOSIS — M6281 Muscle weakness (generalized): Secondary | ICD-10-CM | POA: Diagnosis not present

## 2023-11-03 DIAGNOSIS — R41841 Cognitive communication deficit: Secondary | ICD-10-CM | POA: Diagnosis not present

## 2023-11-03 DIAGNOSIS — R1311 Dysphagia, oral phase: Secondary | ICD-10-CM | POA: Diagnosis not present

## 2023-11-03 DIAGNOSIS — G3183 Dementia with Lewy bodies: Secondary | ICD-10-CM | POA: Diagnosis not present

## 2023-11-04 DIAGNOSIS — G3183 Dementia with Lewy bodies: Secondary | ICD-10-CM | POA: Diagnosis not present

## 2023-11-04 DIAGNOSIS — R41841 Cognitive communication deficit: Secondary | ICD-10-CM | POA: Diagnosis not present

## 2023-11-04 DIAGNOSIS — R1311 Dysphagia, oral phase: Secondary | ICD-10-CM | POA: Diagnosis not present

## 2023-11-04 DIAGNOSIS — M6281 Muscle weakness (generalized): Secondary | ICD-10-CM | POA: Diagnosis not present

## 2023-11-04 DIAGNOSIS — R2689 Other abnormalities of gait and mobility: Secondary | ICD-10-CM | POA: Diagnosis not present

## 2023-11-07 DIAGNOSIS — G3183 Dementia with Lewy bodies: Secondary | ICD-10-CM | POA: Diagnosis not present

## 2023-11-07 DIAGNOSIS — R2689 Other abnormalities of gait and mobility: Secondary | ICD-10-CM | POA: Diagnosis not present

## 2023-11-07 DIAGNOSIS — M6281 Muscle weakness (generalized): Secondary | ICD-10-CM | POA: Diagnosis not present

## 2023-11-07 DIAGNOSIS — R1311 Dysphagia, oral phase: Secondary | ICD-10-CM | POA: Diagnosis not present

## 2023-11-07 DIAGNOSIS — R41841 Cognitive communication deficit: Secondary | ICD-10-CM | POA: Diagnosis not present

## 2023-11-09 DIAGNOSIS — R41841 Cognitive communication deficit: Secondary | ICD-10-CM | POA: Diagnosis not present

## 2023-11-09 DIAGNOSIS — R2689 Other abnormalities of gait and mobility: Secondary | ICD-10-CM | POA: Diagnosis not present

## 2023-11-09 DIAGNOSIS — R1311 Dysphagia, oral phase: Secondary | ICD-10-CM | POA: Diagnosis not present

## 2023-11-09 DIAGNOSIS — G3183 Dementia with Lewy bodies: Secondary | ICD-10-CM | POA: Diagnosis not present

## 2023-11-09 DIAGNOSIS — M6281 Muscle weakness (generalized): Secondary | ICD-10-CM | POA: Diagnosis not present

## 2023-11-11 DIAGNOSIS — R41841 Cognitive communication deficit: Secondary | ICD-10-CM | POA: Diagnosis not present

## 2023-11-11 DIAGNOSIS — G3183 Dementia with Lewy bodies: Secondary | ICD-10-CM | POA: Diagnosis not present

## 2023-11-11 DIAGNOSIS — R1311 Dysphagia, oral phase: Secondary | ICD-10-CM | POA: Diagnosis not present

## 2023-11-11 DIAGNOSIS — M6281 Muscle weakness (generalized): Secondary | ICD-10-CM | POA: Diagnosis not present

## 2023-11-11 DIAGNOSIS — R2689 Other abnormalities of gait and mobility: Secondary | ICD-10-CM | POA: Diagnosis not present

## 2023-11-12 DIAGNOSIS — R1311 Dysphagia, oral phase: Secondary | ICD-10-CM | POA: Diagnosis not present

## 2023-11-12 DIAGNOSIS — M6281 Muscle weakness (generalized): Secondary | ICD-10-CM | POA: Diagnosis not present

## 2023-11-12 DIAGNOSIS — R41841 Cognitive communication deficit: Secondary | ICD-10-CM | POA: Diagnosis not present

## 2023-11-12 DIAGNOSIS — R2689 Other abnormalities of gait and mobility: Secondary | ICD-10-CM | POA: Diagnosis not present

## 2023-11-12 DIAGNOSIS — G3183 Dementia with Lewy bodies: Secondary | ICD-10-CM | POA: Diagnosis not present

## 2023-11-13 DIAGNOSIS — G3183 Dementia with Lewy bodies: Secondary | ICD-10-CM | POA: Diagnosis not present

## 2023-11-16 DIAGNOSIS — R1311 Dysphagia, oral phase: Secondary | ICD-10-CM | POA: Diagnosis not present

## 2023-11-16 DIAGNOSIS — G3183 Dementia with Lewy bodies: Secondary | ICD-10-CM | POA: Diagnosis not present

## 2023-11-16 DIAGNOSIS — R2689 Other abnormalities of gait and mobility: Secondary | ICD-10-CM | POA: Diagnosis not present

## 2023-11-16 DIAGNOSIS — M6281 Muscle weakness (generalized): Secondary | ICD-10-CM | POA: Diagnosis not present

## 2023-11-16 DIAGNOSIS — R41841 Cognitive communication deficit: Secondary | ICD-10-CM | POA: Diagnosis not present

## 2023-11-18 DIAGNOSIS — R1311 Dysphagia, oral phase: Secondary | ICD-10-CM | POA: Diagnosis not present

## 2023-11-18 DIAGNOSIS — G3183 Dementia with Lewy bodies: Secondary | ICD-10-CM | POA: Diagnosis not present

## 2023-11-18 DIAGNOSIS — R41841 Cognitive communication deficit: Secondary | ICD-10-CM | POA: Diagnosis not present

## 2023-11-18 DIAGNOSIS — M6281 Muscle weakness (generalized): Secondary | ICD-10-CM | POA: Diagnosis not present

## 2023-11-18 DIAGNOSIS — R2689 Other abnormalities of gait and mobility: Secondary | ICD-10-CM | POA: Diagnosis not present

## 2023-11-19 DIAGNOSIS — R2689 Other abnormalities of gait and mobility: Secondary | ICD-10-CM | POA: Diagnosis not present

## 2023-11-19 DIAGNOSIS — G3183 Dementia with Lewy bodies: Secondary | ICD-10-CM | POA: Diagnosis not present

## 2023-11-19 DIAGNOSIS — M6281 Muscle weakness (generalized): Secondary | ICD-10-CM | POA: Diagnosis not present

## 2023-11-19 DIAGNOSIS — R1311 Dysphagia, oral phase: Secondary | ICD-10-CM | POA: Diagnosis not present

## 2023-11-19 DIAGNOSIS — R41841 Cognitive communication deficit: Secondary | ICD-10-CM | POA: Diagnosis not present

## 2023-11-23 DIAGNOSIS — R1311 Dysphagia, oral phase: Secondary | ICD-10-CM | POA: Diagnosis not present

## 2023-11-23 DIAGNOSIS — M6281 Muscle weakness (generalized): Secondary | ICD-10-CM | POA: Diagnosis not present

## 2023-11-23 DIAGNOSIS — R41841 Cognitive communication deficit: Secondary | ICD-10-CM | POA: Diagnosis not present

## 2023-11-23 DIAGNOSIS — R2689 Other abnormalities of gait and mobility: Secondary | ICD-10-CM | POA: Diagnosis not present

## 2023-11-23 DIAGNOSIS — G3183 Dementia with Lewy bodies: Secondary | ICD-10-CM | POA: Diagnosis not present

## 2023-11-24 DIAGNOSIS — G3183 Dementia with Lewy bodies: Secondary | ICD-10-CM | POA: Diagnosis not present

## 2023-11-24 DIAGNOSIS — R54 Age-related physical debility: Secondary | ICD-10-CM | POA: Diagnosis not present

## 2023-11-24 DIAGNOSIS — Z515 Encounter for palliative care: Secondary | ICD-10-CM | POA: Diagnosis not present

## 2023-11-24 DIAGNOSIS — R2232 Localized swelling, mass and lump, left upper limb: Secondary | ICD-10-CM | POA: Diagnosis not present

## 2023-11-24 DIAGNOSIS — K5900 Constipation, unspecified: Secondary | ICD-10-CM | POA: Diagnosis not present

## 2023-11-24 DIAGNOSIS — R634 Abnormal weight loss: Secondary | ICD-10-CM | POA: Diagnosis not present

## 2023-11-26 DIAGNOSIS — F419 Anxiety disorder, unspecified: Secondary | ICD-10-CM | POA: Diagnosis not present

## 2023-12-04 DIAGNOSIS — L603 Nail dystrophy: Secondary | ICD-10-CM | POA: Diagnosis not present

## 2023-12-04 DIAGNOSIS — I739 Peripheral vascular disease, unspecified: Secondary | ICD-10-CM | POA: Diagnosis not present

## 2023-12-04 DIAGNOSIS — L602 Onychogryphosis: Secondary | ICD-10-CM | POA: Diagnosis not present

## 2023-12-04 DIAGNOSIS — L84 Corns and callosities: Secondary | ICD-10-CM | POA: Diagnosis not present

## 2023-12-14 DIAGNOSIS — G3183 Dementia with Lewy bodies: Secondary | ICD-10-CM | POA: Diagnosis not present

## 2023-12-15 ENCOUNTER — Telehealth: Payer: Self-pay | Admitting: Cardiovascular Disease

## 2023-12-15 NOTE — Telephone Encounter (Signed)
 Husband Systems developer) stated patient is at a care facility, has Lewy Body dementia and needs assistance getting up and into a wheel chair.  Husband stated he received a letter regarding follow-up care and wants advice on next steps.

## 2023-12-16 DIAGNOSIS — I2510 Atherosclerotic heart disease of native coronary artery without angina pectoris: Secondary | ICD-10-CM | POA: Diagnosis not present

## 2023-12-16 DIAGNOSIS — I5032 Chronic diastolic (congestive) heart failure: Secondary | ICD-10-CM | POA: Diagnosis not present

## 2023-12-16 DIAGNOSIS — I110 Hypertensive heart disease with heart failure: Secondary | ICD-10-CM | POA: Diagnosis not present

## 2023-12-16 DIAGNOSIS — E785 Hyperlipidemia, unspecified: Secondary | ICD-10-CM | POA: Diagnosis not present

## 2023-12-16 DIAGNOSIS — G3183 Dementia with Lewy bodies: Secondary | ICD-10-CM | POA: Diagnosis not present

## 2023-12-16 DIAGNOSIS — F419 Anxiety disorder, unspecified: Secondary | ICD-10-CM | POA: Diagnosis not present

## 2023-12-16 DIAGNOSIS — F0284 Dementia in other diseases classified elsewhere, unspecified severity, with anxiety: Secondary | ICD-10-CM | POA: Diagnosis not present

## 2023-12-16 DIAGNOSIS — J449 Chronic obstructive pulmonary disease, unspecified: Secondary | ICD-10-CM | POA: Diagnosis not present

## 2023-12-18 NOTE — Telephone Encounter (Signed)
 Called and spoke w the patient's husband.  She had advanced dementia and he no longer is able to care for her at all at home and she is full time in a facility.  She is complete care there.  He is not able to move her to bring her for follow up.  He feels she is comfortable and does not plan to bring her to any more visits. He will let us  know if there is anything he needs.  Adv I would let Dr. Verlin know.

## 2023-12-29 DIAGNOSIS — R051 Acute cough: Secondary | ICD-10-CM | POA: Diagnosis not present

## 2023-12-29 DIAGNOSIS — F0390 Unspecified dementia without behavioral disturbance: Secondary | ICD-10-CM | POA: Diagnosis not present

## 2023-12-29 DIAGNOSIS — J449 Chronic obstructive pulmonary disease, unspecified: Secondary | ICD-10-CM | POA: Diagnosis not present

## 2023-12-29 DIAGNOSIS — I509 Heart failure, unspecified: Secondary | ICD-10-CM | POA: Diagnosis not present

## 2023-12-30 DIAGNOSIS — R059 Cough, unspecified: Secondary | ICD-10-CM | POA: Diagnosis not present

## 2023-12-30 DIAGNOSIS — R0989 Other specified symptoms and signs involving the circulatory and respiratory systems: Secondary | ICD-10-CM | POA: Diagnosis not present

## 2024-01-08 DIAGNOSIS — G3183 Dementia with Lewy bodies: Secondary | ICD-10-CM | POA: Diagnosis not present

## 2024-01-08 DIAGNOSIS — J101 Influenza due to other identified influenza virus with other respiratory manifestations: Secondary | ICD-10-CM | POA: Diagnosis not present

## 2024-01-08 DIAGNOSIS — M6281 Muscle weakness (generalized): Secondary | ICD-10-CM | POA: Diagnosis not present

## 2024-01-11 DIAGNOSIS — F0282 Dementia in other diseases classified elsewhere, unspecified severity, with psychotic disturbance: Secondary | ICD-10-CM | POA: Diagnosis not present

## 2024-01-11 DIAGNOSIS — R051 Acute cough: Secondary | ICD-10-CM | POA: Diagnosis not present

## 2024-01-11 DIAGNOSIS — Z8679 Personal history of other diseases of the circulatory system: Secondary | ICD-10-CM | POA: Diagnosis not present

## 2024-01-11 DIAGNOSIS — F5101 Primary insomnia: Secondary | ICD-10-CM | POA: Diagnosis not present

## 2024-01-11 DIAGNOSIS — G3183 Dementia with Lewy bodies: Secondary | ICD-10-CM | POA: Diagnosis not present

## 2024-01-11 DIAGNOSIS — I5032 Chronic diastolic (congestive) heart failure: Secondary | ICD-10-CM | POA: Diagnosis not present

## 2024-01-11 DIAGNOSIS — I110 Hypertensive heart disease with heart failure: Secondary | ICD-10-CM | POA: Diagnosis not present

## 2024-01-11 DIAGNOSIS — M6281 Muscle weakness (generalized): Secondary | ICD-10-CM | POA: Diagnosis not present

## 2024-01-11 DIAGNOSIS — J449 Chronic obstructive pulmonary disease, unspecified: Secondary | ICD-10-CM | POA: Diagnosis not present

## 2024-01-12 DIAGNOSIS — M6281 Muscle weakness (generalized): Secondary | ICD-10-CM | POA: Diagnosis not present

## 2024-01-12 DIAGNOSIS — G3183 Dementia with Lewy bodies: Secondary | ICD-10-CM | POA: Diagnosis not present

## 2024-01-13 DIAGNOSIS — M6281 Muscle weakness (generalized): Secondary | ICD-10-CM | POA: Diagnosis not present

## 2024-01-13 DIAGNOSIS — G3183 Dementia with Lewy bodies: Secondary | ICD-10-CM | POA: Diagnosis not present

## 2024-01-14 DIAGNOSIS — G3183 Dementia with Lewy bodies: Secondary | ICD-10-CM | POA: Diagnosis not present

## 2024-01-14 DIAGNOSIS — I10 Essential (primary) hypertension: Secondary | ICD-10-CM | POA: Diagnosis not present

## 2024-01-14 DIAGNOSIS — M6281 Muscle weakness (generalized): Secondary | ICD-10-CM | POA: Diagnosis not present

## 2024-01-15 DIAGNOSIS — G3183 Dementia with Lewy bodies: Secondary | ICD-10-CM | POA: Diagnosis not present

## 2024-01-15 DIAGNOSIS — M6281 Muscle weakness (generalized): Secondary | ICD-10-CM | POA: Diagnosis not present

## 2024-01-18 DIAGNOSIS — G3183 Dementia with Lewy bodies: Secondary | ICD-10-CM | POA: Diagnosis not present

## 2024-01-18 DIAGNOSIS — M6281 Muscle weakness (generalized): Secondary | ICD-10-CM | POA: Diagnosis not present

## 2024-01-20 DIAGNOSIS — G3183 Dementia with Lewy bodies: Secondary | ICD-10-CM | POA: Diagnosis not present

## 2024-01-20 DIAGNOSIS — M6281 Muscle weakness (generalized): Secondary | ICD-10-CM | POA: Diagnosis not present

## 2024-01-21 DIAGNOSIS — R2232 Localized swelling, mass and lump, left upper limb: Secondary | ICD-10-CM | POA: Diagnosis not present

## 2024-01-21 DIAGNOSIS — G3183 Dementia with Lewy bodies: Secondary | ICD-10-CM | POA: Diagnosis not present

## 2024-01-21 DIAGNOSIS — R634 Abnormal weight loss: Secondary | ICD-10-CM | POA: Diagnosis not present

## 2024-01-21 DIAGNOSIS — K5900 Constipation, unspecified: Secondary | ICD-10-CM | POA: Diagnosis not present

## 2024-01-21 DIAGNOSIS — R54 Age-related physical debility: Secondary | ICD-10-CM | POA: Diagnosis not present

## 2024-01-21 DIAGNOSIS — H2513 Age-related nuclear cataract, bilateral: Secondary | ICD-10-CM | POA: Diagnosis not present

## 2024-01-21 DIAGNOSIS — R053 Chronic cough: Secondary | ICD-10-CM | POA: Diagnosis not present

## 2024-01-21 DIAGNOSIS — Z515 Encounter for palliative care: Secondary | ICD-10-CM | POA: Diagnosis not present

## 2024-01-22 DIAGNOSIS — G3183 Dementia with Lewy bodies: Secondary | ICD-10-CM | POA: Diagnosis not present

## 2024-01-22 DIAGNOSIS — M6281 Muscle weakness (generalized): Secondary | ICD-10-CM | POA: Diagnosis not present

## 2024-01-25 DIAGNOSIS — G3183 Dementia with Lewy bodies: Secondary | ICD-10-CM | POA: Diagnosis not present

## 2024-01-25 DIAGNOSIS — D179 Benign lipomatous neoplasm, unspecified: Secondary | ICD-10-CM | POA: Diagnosis not present

## 2024-01-25 DIAGNOSIS — M6281 Muscle weakness (generalized): Secondary | ICD-10-CM | POA: Diagnosis not present

## 2024-01-27 DIAGNOSIS — R2232 Localized swelling, mass and lump, left upper limb: Secondary | ICD-10-CM | POA: Diagnosis not present

## 2024-01-27 DIAGNOSIS — R053 Chronic cough: Secondary | ICD-10-CM | POA: Diagnosis not present

## 2024-01-27 DIAGNOSIS — G3183 Dementia with Lewy bodies: Secondary | ICD-10-CM | POA: Diagnosis not present

## 2024-01-27 DIAGNOSIS — R634 Abnormal weight loss: Secondary | ICD-10-CM | POA: Diagnosis not present

## 2024-01-27 DIAGNOSIS — Z515 Encounter for palliative care: Secondary | ICD-10-CM | POA: Diagnosis not present

## 2024-01-27 DIAGNOSIS — M6281 Muscle weakness (generalized): Secondary | ICD-10-CM | POA: Diagnosis not present

## 2024-01-27 DIAGNOSIS — R54 Age-related physical debility: Secondary | ICD-10-CM | POA: Diagnosis not present

## 2024-01-27 DIAGNOSIS — K5900 Constipation, unspecified: Secondary | ICD-10-CM | POA: Diagnosis not present

## 2024-01-29 DIAGNOSIS — G3183 Dementia with Lewy bodies: Secondary | ICD-10-CM | POA: Diagnosis not present

## 2024-01-29 DIAGNOSIS — M6281 Muscle weakness (generalized): Secondary | ICD-10-CM | POA: Diagnosis not present

## 2024-02-01 DIAGNOSIS — G3183 Dementia with Lewy bodies: Secondary | ICD-10-CM | POA: Diagnosis not present

## 2024-02-01 DIAGNOSIS — M6281 Muscle weakness (generalized): Secondary | ICD-10-CM | POA: Diagnosis not present

## 2024-02-03 DIAGNOSIS — M6281 Muscle weakness (generalized): Secondary | ICD-10-CM | POA: Diagnosis not present

## 2024-02-03 DIAGNOSIS — G3183 Dementia with Lewy bodies: Secondary | ICD-10-CM | POA: Diagnosis not present

## 2024-02-05 DIAGNOSIS — M6281 Muscle weakness (generalized): Secondary | ICD-10-CM | POA: Diagnosis not present

## 2024-02-05 DIAGNOSIS — G3183 Dementia with Lewy bodies: Secondary | ICD-10-CM | POA: Diagnosis not present

## 2024-03-21 ENCOUNTER — Ambulatory Visit: Payer: Medicare PPO | Admitting: Neurology
